# Patient Record
Sex: Female | Born: 1990 | Race: Black or African American | Hispanic: No | Marital: Single
Health system: Midwestern US, Community
[De-identification: ages and names within clinical notes are randomized; demographics above are authoritative.]

## PROBLEM LIST (undated history)

## (undated) DIAGNOSIS — K859 Acute pancreatitis without necrosis or infection, unspecified: Secondary | ICD-10-CM

## (undated) DIAGNOSIS — J45909 Unspecified asthma, uncomplicated: Secondary | ICD-10-CM

---
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
## 2009-06-15 LAB — CBC WITH AUTOMATED DIFF
ABS. BASOPHILS: 0 10*3/uL (ref 0.0–0.06)
ABS. EOSINOPHILS: 0.1 10*3/uL (ref 0.0–0.4)
ABS. LYMPHOCYTES: 0.9 10*3/uL (ref 0.9–3.6)
ABS. MONOCYTES: 0.5 10*3/uL (ref 0.05–1.2)
ABS. NEUTROPHILS: 11.9 10*3/uL — ABNORMAL HIGH (ref 1.8–8.0)
BASOPHILS: 0 % (ref 0–2)
EOSINOPHILS: 1 % (ref 0–5)
HCT: 34 % — ABNORMAL LOW (ref 35.0–45.0)
HGB: 11.5 g/dL — ABNORMAL LOW (ref 12.0–16.0)
LYMPHOCYTES: 7 % — ABNORMAL LOW (ref 21–52)
MCH: 24 PG (ref 24.0–34.0)
MCHC: 33.8 g/dL (ref 31.0–37.0)
MCV: 70.8 FL — ABNORMAL LOW (ref 74.0–97.0)
MONOCYTES: 4 % (ref 3–10)
MPV: 11.1 FL (ref 9.2–11.8)
NEUTROPHILS: 88 % — ABNORMAL HIGH (ref 40–73)
PLATELET: 190 10*3/uL (ref 135–420)
RBC: 4.8 M/uL (ref 4.20–5.30)
RDW: 14.1 % (ref 11.6–14.5)
WBC: 13.4 10*3/uL — ABNORMAL HIGH (ref 4.6–13.2)

## 2009-06-15 LAB — URINALYSIS W/ RFLX MICROSCOPIC
Bilirubin: NEGATIVE
Glucose: NEGATIVE MG/DL
Leukocyte Esterase: NEGATIVE
Nitrites: NEGATIVE
Protein: NEGATIVE MG/DL
Specific gravity: 1.01 (ref 1.003–1.030)
Urobilinogen: 0.2 EU/DL (ref 0.2–1.0)
pH (UA): 6 (ref 5.0–8.0)

## 2009-06-15 LAB — METABOLIC PANEL, BASIC
Anion gap: 15 mmol/L (ref 5–15)
BUN/Creatinine ratio: 8 — ABNORMAL LOW (ref 12–20)
BUN: 6 MG/DL — ABNORMAL LOW (ref 7–18)
CO2: 20 MMOL/L — ABNORMAL LOW (ref 21–32)
Calcium: 8.6 MG/DL (ref 8.4–10.4)
Chloride: 100 MMOL/L (ref 100–108)
Creatinine: 0.8 MG/DL (ref 0.6–1.3)
GFR est AA: >60 mL/min/{1.73_m2} (ref >60)
GFR est non-AA: >60 mL/min/{1.73_m2} (ref >60)
Glucose: 135 MG/DL — ABNORMAL HIGH (ref 74–99)
Potassium: 3.3 MMOL/L — ABNORMAL LOW (ref 3.5–5.5)
Sodium: 135 MMOL/L — ABNORMAL LOW (ref 136–145)

## 2009-06-15 LAB — URINE MICROSCOPIC ONLY
RBC: 0 /HPF (ref 0–5)
WBC: 0 /HPF (ref 0–4)

## 2009-06-15 LAB — HCG URINE, QL: HCG urine, QL: NEGATIVE

## 2009-06-15 NOTE — H&P (Unsigned)
[facility]   [address] [place], [place] [address]     HISTORY AND PHYSICAL REPORT    PATIENT: Rhonda Hammond, Rhonda Hammond  BILLING: [id number] ADMITTED: 06/15/2009  MRN: [id number] LOCATION: [id number]  ATTENDING: Adonis Brook, MD  DICTATING: Adonis Brook, MD      CHIEF COMPLAINT: Shortness of breath.    HISTORY OF PRESENT ILLNESS: This is a pleasant 19 year old female, who has  not seen a physician since she was 30. She claims that she saw Dr. Domenick Bookbinder  in [place] as a pediatrician She has not had any follow up since that  time. She takes, reportedly, some ProAir or albuterol metered-dose inhaler,  but has no other medications. She had increased shortness of breath for the  last few days. Came into the emergency room and was evaluated as having  asthma. O2 saturations have been 91% on room air, hovering in 94 to 95% on  oxygen and after breathing treatments. The patient is still short of breath  and still tachypneic. She has no chest pain. No other issues. She also  claims to have Advair Diskus at home but does not know the dosage.    PAST MEDICAL HISTORY: Asthma, with intubation as a child. Again, has not  seen anyone in terms of medical treatment for at least 3 years.    MEDICATIONS  1. Advair Diskus.  2. Possible albuterol.    ALLERGIES: NO KNOWN DRUG ALLERGIES.    SOCIAL HISTORY: The patient claims to not use tobacco, alcohol, or drugs.  She is independent for all ADLs and IDLs.    REVIEW OF SYSTEMS: Essentially as above.    FAMILY HISTORY: Noncontributory.    PHYSICAL EXAMINATION  VITAL SIGNS: Blood pressure is 139/49, respiratory rate of 24, pulse rate  of 122, temperature is 99.1, O2 saturation is 93% on room air, 94 to 95% on  2 L.  HEENT: Within normal limits.  NECK: No JVD, thyromegaly, bruits or lymphadenopathy.  CHEST: Currently clear to auscultation, though by ER report had some  expiratory wheezing.  CARDIOVASCULAR: Tachycardic. Regular rhythm, however, other than   tachycardia.  ABDOMEN: Soft, nontender, nondistended. Positive bowel sounds.  EXTREMITIES: No clubbing, cyanosis, or edema.  NEUROLOGIC: Nonfocal.    LABORATORY DATA: Laboratory data has not been drawn as of yet. The patient  is just being admitted with basic metabolic panel, CBC, urine pregnancy  test, all drawn. These are pending at the present time.    ASSESSMENT/PLAN  1. Asthma. The patient had has had all of her potential triggers run. There  is no obvious trigger other than possibly pollen from spring/summer season.  She denies having any dust, smoking etc. as a trigger. We will continue  with nebulizer treatments, antibiotics and steroids. As she is still  tachypneic, she does not appear to be tiring out; however, we will monitor  overnight in ICU/PCU status, in case she requires BiPAP etc.  2. The patient is a FULL CODE.  3. Will do Protonix and Lovenox for gastrointestinal/deep venous thrombosis  prophylaxis.  4. Life coach will be contacted for primary care.                         Preliminary   Adonis Brook, MD        RB:wmx  D: 06/15/2009 12:25 A T: 06/15/2009 12:50 A  Job #: [id number] CScriptDoc #: [id number]  cc: Adonis Brook, MD

## 2010-01-26 LAB — POC CHEM8
Anion gap, POC: 17 (ref 10–20)
BUN, POC: 11 MG/DL (ref 7–18)
CO2, POC: 22 MMOL/L (ref 19–24)
Calcium, ionized (POC): 1.14 MMOL/L (ref 1.12–1.32)
Chloride, POC: 104 MMOL/L (ref 100–108)
Creatinine, POC: 0.6 MG/DL (ref 0.6–1.3)
GFRAA, POC: >60 mL/min/{1.73_m2} (ref >60)
GFRNA, POC: >60 mL/min/{1.73_m2} (ref >60)
Glucose, POC: 80 MG/DL (ref 74–106)
Hematocrit, POC: 39 % (ref 36.0–46.0)
Hemoglobin, POC: 13.3 G/DL (ref 12.0–16.0)
Potassium, POC: 4.1 MMOL/L (ref 3.5–5.5)
Sodium, POC: 138 MMOL/L (ref 136–145)

## 2010-01-26 LAB — URINE MICROSCOPIC ONLY
RBC: 0 /HPF (ref 0–5)
WBC: 11 /HPF (ref 0–4)

## 2010-01-26 LAB — URINALYSIS W/ RFLX MICROSCOPIC
Bilirubin: NEGATIVE
Blood: NEGATIVE
Glucose: NEGATIVE MG/DL
Ketone: 40 MG/DL — AB
Nitrites: NEGATIVE
Specific gravity: 1.025 (ref 1.003–1.030)
Urobilinogen: 0.2 EU/DL (ref 0.2–1.0)
pH (UA): 7 (ref 5.0–8.0)

## 2010-01-26 LAB — HCG URINE, QL: HCG urine, QL: NEGATIVE

## 2011-03-29 ENCOUNTER — Inpatient Hospital Stay: Admit: 2011-03-29 | Discharge: 2011-03-29 | Attending: Emergency Medicine

## 2011-03-29 DIAGNOSIS — Z5321 Procedure and treatment not carried out due to patient leaving prior to being seen by health care provider: Secondary | ICD-10-CM

## 2011-03-29 NOTE — ED Notes (Signed)
No answer when called to triage.

## 2011-03-29 NOTE — ED Notes (Signed)
No answer to triage when called 2nd time.

## 2011-03-29 NOTE — ED Notes (Signed)
Called patient third time to triage , no answer. Patient not in wait room area, not outside ER, not in restroom.

## 2011-03-31 NOTE — ED Provider Notes (Signed)
HPI     No past medical history on file.     No past surgical history on file.      No family history on file.     History     Social History   ??? Marital Status: SINGLE     Spouse Name: N/A     Number of Children: N/A   ??? Years of Education: N/A     Occupational History   ??? Not on file.     Social History Main Topics   ??? Smoking status: Not on file   ??? Smokeless tobacco: Not on file   ??? Alcohol Use: Not on file   ??? Drug Use: Not on file   ??? Sexually Active: Not on file     Other Topics Concern   ??? Not on file     Social History Narrative   ??? No narrative on file                  ALLERGIES: Review of patient's allergies indicates not on file.      Review of Systems    There were no vitals filed for this visit.         Physical Exam     MDM    Procedures    The patient LWBS. I was not involved in any aspect of the patient's care. I reviewed the triage sheet and recommend that the patient be called back for further evaluation.

## 2011-07-16 ENCOUNTER — Encounter (HOSPITAL_COMMUNITY): Payer: Self-pay | Admitting: Emergency Medicine

## 2011-07-16 ENCOUNTER — Emergency Department (HOSPITAL_COMMUNITY): Payer: Self-pay

## 2011-07-16 ENCOUNTER — Emergency Department (HOSPITAL_COMMUNITY)
Admission: EM | Admit: 2011-07-16 | Discharge: 2011-07-16 | Disposition: A | Discharge status: Home / Self Care | Payer: Self-pay | Attending: Emergency Medicine | Admitting: Emergency Medicine

## 2011-07-16 DIAGNOSIS — R062 Wheezing: Secondary | ICD-10-CM | POA: Insufficient documentation

## 2011-07-16 DIAGNOSIS — R079 Chest pain, unspecified: Secondary | ICD-10-CM | POA: Insufficient documentation

## 2011-07-16 DIAGNOSIS — R10819 Abdominal tenderness, unspecified site: Secondary | ICD-10-CM | POA: Insufficient documentation

## 2011-07-16 DIAGNOSIS — K8020 Calculus of gallbladder without cholecystitis without obstruction: Secondary | ICD-10-CM | POA: Insufficient documentation

## 2011-07-16 DIAGNOSIS — N200 Calculus of kidney: Secondary | ICD-10-CM | POA: Insufficient documentation

## 2011-07-16 DIAGNOSIS — R109 Unspecified abdominal pain: Secondary | ICD-10-CM | POA: Insufficient documentation

## 2011-07-16 HISTORY — DX: Unspecified asthma, uncomplicated: J45.909

## 2011-07-16 LAB — COMPREHENSIVE METABOLIC PANEL
ALT: 16 U/L (ref 0–35)
AST: 26 U/L (ref 0–37)
Albumin: 4.2 g/dL (ref 3.5–5.2)
Alkaline Phosphatase: 73 U/L (ref 39–117)
BUN: 5 mg/dL — ABNORMAL LOW (ref 6–23)
CO2: 23 mEq/L (ref 19–32)
Calcium: 9.7 mg/dL (ref 8.4–10.5)
Chloride: 101 mEq/L (ref 96–112)
Creatinine, Ser: 0.6 mg/dL (ref 0.50–1.10)
GFR calc Af Amer: >90 mL/min (ref >90)
GFR calc non Af Amer: >90 mL/min (ref >90)
Glucose, Bld: 86 mg/dL (ref 70–99)
Potassium: 3.9 mEq/L (ref 3.5–5.1)
Sodium: 137 mEq/L (ref 135–145)
Total Bilirubin: 0.4 mg/dL (ref 0.3–1.2)
Total Protein: 8.8 g/dL — ABNORMAL HIGH (ref 6.0–8.3)

## 2011-07-16 LAB — CBC
HCT: 35.5 % — ABNORMAL LOW (ref 36.0–46.0)
Hemoglobin: 12.1 g/dL (ref 12.0–15.0)
MCH: 23.1 pg — ABNORMAL LOW (ref 26.0–34.0)
MCHC: 34.1 g/dL (ref 30.0–36.0)
MCV: 67.7 fL — ABNORMAL LOW (ref 78.0–100.0)
Platelets: 204 10*3/uL (ref 150–400)
RBC: 5.24 MIL/uL — ABNORMAL HIGH (ref 3.87–5.11)
RDW: 17 % — ABNORMAL HIGH (ref 11.5–15.5)
WBC: 11.3 10*3/uL — ABNORMAL HIGH (ref 4.0–10.5)

## 2011-07-16 LAB — URINALYSIS, ROUTINE W REFLEX MICROSCOPIC
Bilirubin Urine: NEGATIVE
Glucose, UA: NEGATIVE mg/dL
Hgb urine dipstick: NEGATIVE
Ketones, ur: NEGATIVE mg/dL
Nitrite: NEGATIVE
Protein, ur: 30 mg/dL — AB
Specific Gravity, Urine: 1.017 (ref 1.005–1.030)
Urobilinogen, UA: 0.2 mg/dL (ref 0.0–1.0)
pH: 6.5 (ref 5.0–8.0)

## 2011-07-16 LAB — PREGNANCY, URINE: Preg Test, Ur: NEGATIVE

## 2011-07-16 LAB — LIPASE, BLOOD: Lipase: 15 U/L (ref 11–59)

## 2011-07-16 LAB — URINE MICROSCOPIC-ADD ON

## 2011-07-16 MED ORDER — HYDROMORPHONE HCL PF 1 MG/ML IJ SOLN
1.0000 mg | Freq: Once | INTRAMUSCULAR | Status: AC
  Administered 2011-07-16: 1 mg via INTRAVENOUS
  Filled 2011-07-16: qty 1

## 2011-07-16 MED ORDER — OXYCODONE-ACETAMINOPHEN 5-325 MG PO TABS: 1.0000 | ORAL_TABLET | ORAL | Status: AC | PRN

## 2011-07-16 MED ORDER — ONDANSETRON HCL 4 MG/2ML IJ SOLN
4.0000 mg | Freq: Once | INTRAMUSCULAR | Status: AC
  Administered 2011-07-16: 4 mg via INTRAVENOUS
  Filled 2011-07-16: qty 2

## 2011-07-16 MED ORDER — ALBUTEROL SULFATE (5 MG/ML) 0.5% IN NEBU
INHALATION_SOLUTION | RESPIRATORY_TRACT | Status: AC
  Administered 2011-07-16: 15:00:00
  Filled 2011-07-16: qty 1

## 2011-07-16 MED ORDER — SODIUM CHLORIDE 0.9 % IV BOLUS (SEPSIS)
1000.0000 mL | Freq: Once | INTRAVENOUS | Status: AC
  Administered 2011-07-16: 1000 mL via INTRAVENOUS

## 2011-07-16 MED ORDER — IOHEXOL 300 MG/ML  SOLN
100.0000 mL | Freq: Once | INTRAMUSCULAR | Status: AC | PRN
  Administered 2011-07-16: 100 mL via INTRAVENOUS

## 2011-07-16 NOTE — ED Notes (Signed)
Pt states that she has been having diffuse upper abdominal pain x 3 days.  Pain got worse today.  While EMS was assessing her she felt like she was beginning to have an asthma attack.  Pt was put on a 5 mg albuterol tx.  Wheezing has since subsided.

## 2011-07-16 NOTE — ED Provider Notes (Signed)
History    21yF with abdominal pain. Gradual onset about 3d ago. Diffuse on R side, worst in RUQ. Constant. No radiation. Worse with movement. No n/v/d. No fever or chills. No urinary complaints. No vaginal bleeding or discharge. Doesn't think pregnant. Surgical hx significant c-section about 3y ago. Denies recent procedures. Denies CP as mentioned in triage note. No SOB.  CSN: [id number]  Arrival date & time 07/16/11  1452   First MD Initiated Contact with Patient 07/16/11 1539      Chief Complaint  Patient presents with  . Abdominal Pain  . Chest Pain    tightness w/ wheezing    (Consider location/radiation/quality/duration/timing/severity/associated sxs/prior treatment) HPI  Past Medical History  Diagnosis Date  . Asthma     History reviewed. No pertinent past surgical history.  History reviewed. No pertinent family history.  History  Substance Use Topics  . Smoking status: Current Everyday Smoker -- 1.0 packs/day    Types: Cigarettes  . Smokeless tobacco: Not on file  . Alcohol Use: Yes    OB History    Grav Para Term Preterm Abortions TAB SAB Ect Mult Living                  Review of Systems   Review of symptoms negative unless otherwise noted in HPI.   Allergies  Review of patient's allergies indicates no known allergies.  Home Medications   Current Outpatient Rx  Name Route Sig Dispense Refill  . ASPIRIN-SALICYLAMIDE-CAFFEINE 650-195-33.3 MG PO PACK Oral Take 2 Packages by mouth once.      BP 112/67  Pulse 91  Resp 22  SpO2 100%  LMP 07/01/2011  Physical Exam  Nursing note and vitals reviewed. Constitutional: She appears well-developed and well-nourished. No distress.       Laying in bed. Nad. Obese.  HENT:  Head: Normocephalic and atraumatic.  Eyes: Conjunctivae are normal. Right eye exhibits no discharge. Left eye exhibits no discharge.  Neck: Neck supple.  Cardiovascular: Normal rate, regular rhythm and normal heart sounds.  Exam  reveals no gallop and no friction rub.   No murmur heard. Pulmonary/Chest: Effort normal and breath sounds normal. No respiratory distress.  Abdominal: Soft. She exhibits no distension and no mass. There is tenderness. There is guarding. There is no rebound.       R sided abdominal tenderness with voluntary guarding. No rebound. No distension.  Genitourinary:       No cva tenderness  Musculoskeletal: She exhibits no edema and no tenderness.  Neurological: She is alert.  Skin: Skin is warm and dry. She is not diaphoretic.  Psychiatric: She has a normal mood and affect. Her behavior is normal. Thought content normal.    ED Course  Procedures (including critical care time)  Labs Reviewed  URINALYSIS, ROUTINE W REFLEX MICROSCOPIC - Abnormal; Notable for the following:    APPearance CLOUDY (*)     Protein, ur 30 (*)     Leukocytes, UA SMALL (*)     All other components within normal limits  CBC - Abnormal; Notable for the following:    WBC 11.3 (*)     RBC 5.24 (*)     HCT 35.5 (*)     MCV 67.7 (*)     MCH 23.1 (*)     RDW 17.0 (*)     All other components within normal limits  COMPREHENSIVE METABOLIC PANEL - Abnormal; Notable for the following:    BUN 5 (*)  Total Protein 8.8 (*)     All other components within normal limits  LIPASE, BLOOD  PREGNANCY, URINE  URINE MICROSCOPIC-ADD ON   US Abdomen Complete  07/16/2011  *RADIOLOGY REPORT*  Clinical Data:  Right upper quadrant pain.  COMPLETE ABDOMINAL ULTRASOUND  Comparison:  CT earlier today.  Findings:  Gallbladder:  Multiple small gallstones within the gallbladder.  No wall thickening.  Negative sonographic Murphy's.  Common bile duct:   Normal caliber, 4 mm.  Liver:  No focal lesion identified.  Within normal limits in parenchymal echogenicity.  IVC:  Appears normal.  Pancreas:  No focal abnormality seen.  Spleen:  Within normal limits in size and echotexture.  Right Kidney:   Normal in size and parenchymal echogenicity.  No  evidence of mass or hydronephrosis.  Left Kidney:  Normal in size and parenchymal echogenicity.  No evidence of mass or hydronephrosis.  Abdominal aorta:  No aneurysm identified.  IMPRESSION: Cholelithiasis.  No sonographic evidence of acute cholecystitis.  Original Report Authenticated By: Cyndie Chime, M.D.   Ct Abdomen Pelvis W Contrast  07/16/2011  *RADIOLOGY REPORT*  Clinical Data: Right-sided abdominal pain  CT ABDOMEN AND PELVIS WITH CONTRAST  Technique:  Multidetector CT imaging of the abdomen and pelvis was performed following the standard protocol during bolus administration of intravenous contrast.  Contrast: OMNIPAQUE IOHEXOL 300 MG/ML  SOLN  Comparison: None.  Findings: Dependent type changes noted within the lung bases medially.  No pericardial or pleural effusion.  There is no focal liver abnormality noted.  The gallbladder appears normal.  There is no biliary dilatation.  The pancreas is unremarkable.  The spleen is negative.  Both adrenal glands are normal.  Multiple small renal calculi are identified.  No obstructive uropathy identified.  No hydronephrosis or hydroureter identified.  No ureterolithiasis or bladder calculi identified.  The uterus and adnexal structures have a normal physiologic appearance  The stomach and the small bowel loops are within normal limits. The common the appendix is identified and appears normal.  The colon is normal.  There is no free fluid or fluid collections within the abdomen or pelvis.  No enlarged upper abdominal or pelvic adenopathy.  No inguinal adenopathy.  Review of the visualized bony structures is unremarkable.  IMPRESSION:  1. Bilateral nonobstructing renal calculi.  Original Report Authenticated By: Rosealee Albee, M.D.     1. Abdominal pain   2. Cholelithiasis       MDM  21 year old female with right-sided abdominal pain. Workup significant for cholelithiasis. Suspect the patient's symptoms are secondary to this. There is no  evidence of cholecystitis. Plan symptomatic treatment and surgical referral.        Raeford Razor, MD 07/17/11 [phone]

## 2011-07-16 NOTE — ED Notes (Signed)
ZOX:WR60<AV> Expected date:07/16/11<BR> Expected time: 2:50 PM<BR> Means of arrival:Ambulance<BR> Comments:<BR> Diarrhea, Shob, neb tx in progress

## 2011-07-16 NOTE — ED Notes (Signed)
Per EMS: Pt coming from home c/o rt and lt upper quadrant abdominal pain w/ chest tightness.  Wheezing (inspiratory and expiratory).  Hx of asthma.  No home meds for it.  Pt given 5 albuterol en route.  20 g in lt AC.

## 2011-07-16 NOTE — Discharge Instructions (Signed)
RESOURCE GUIDE  Chronic Pain Problems: Contact [facility]  [phone] Patients need to be referred by their primary care doctor.  Insufficient Money for Medicine: Contact United Way:  call "211" or [facility] [phone].  No Primary Care Doctor: - Call [facility] Connect  [phone] - can help you locate a primary care doctor that  accepts your insurance, provides certain services, etc. - Physician Referral Service- 1-800-533-3463  Agencies that provide inexpensive medical care: - [facility]  [phone] - [facility]  [phone] - [facility]  [phone] - [facility]'s Clinic  [phone] - Planned Parenthood  [phone] - [facility]  [phone]  Medicaid-accepting [place] Providers: - [facility]- [address]  [phone], Mon-Fri 9am-7pm, Sat 9am-1pm - [facility]- [address], [address]  [phone] - [facility]- [address], [address]  [phone] - [facility]- [address] [place] [address]  [phone] - Ve[facility]- [address], [address], [phone]  Only accepts [place] Access Medicaid patients after they have their name  applied to their card  Self Pay (no insurance) in [place]: - Sickle Cell Patients: Dr Eric Dean, [facility]  [address], [phone] - [facility]- [address]  [phone]       -     [facility]- [address]       -     [facility]- see information above (Speak to Pam H if you do not have insurance)       -  [facility]- [address], [phone]       -  [facility] [place]- [address],  [phone]       -  [facility]- [address] [place] [address], [phone]       -  Dr Osei-Bonsu-  [address], [place], [phone]       -  [facility]- [address], [phone]       -  [facility]- [address] [place] [address], [phone], also [address], [phone]       -    [facility]- [address], [phone], 1st & 3rd Saturday   every month, 10am-1pm  1) Find a Doctor and Pay Out of Pocket Although you won't have to find out who is covered by your insurance plan, it is a good idea to ask around and get recommendations. You will then need to call the office and see if the doctor you have chosen will accept you as a new patient and what types of options they offer for patients who are self-pay. Some doctors offer discounts or will set up payment plans for their patients who do not have insurance, but you will need to ask so you aren't surprised when you get to your appointment.  2) Contact Your Local Health Department Not all health departments have doctors that can see patients for sick visits, but many do, so it is worth a call to see if yours does. If you don't know where your local health department is, you can check in your phone book. The CDC also has a tool to help you locate your state's health department, and many state websites also have   listings of all of their local health departments.  3) Find a Walk-in Clinic If your illness is not likely to be very severe or complicated, you may want to try a walk in clinic. These are popping up all over the country in pharmacies, drugstores, and shopping centers. They're usually staffed by nurse practitioners or physician assistants that have been trained to treat common illnesses and complaints. They're usually fairly quick and inexpensive. However, if you have serious medical issues or chronic medical problems, these are probably not your best option  STD Testing - [place] [facility] [place], [facility], [address], [place], phone [phone] or 1-877-539-9860.  Monday - Friday, call for an appointment. - [place]  [facility] [place], [facility], [address], [place], phone [phone] or 1-877-539-9860.  Monday - Friday, call for an appointment.  Abuse/Neglect: - [place] Child Abuse Hotline (336) 641-3795 - [place] Child Abuse Hotline 800-378-5315 (After Hours)  Emergency Shelter:  [facility] (336) 271-5985  Maternity Homes: - [facility] (336) 275-9566 - [facility] (704) 372-4663  MRSA Hotline #:   832-7006  [place] Resources  [facility] [place]  [employer] [place] [facility] [address]                 [address]         [address]  [place]                                               [place]                              [place] Phone:  [phone]                                  Phone:  [phone]                   Phone:  [phone]  [place] [facility], [phone] - [place] Services - [facility]- 1-888-581-9988       -     [facility] in [place], [address],                                  336-349-4454, Insurance  [place] Child Abuse Hotline (336) 342-1394 or (336) 342-3537 (After Hours)   Behavioral Health Services  Substance Abuse Resources: - [facility]  336-882-2125 - [facility] 336-784-9470 - [facility] [place] [facility] 336-285-9073 - [facility] 336-845-3988 - Residential & Outpatient Substance Abuse Program  800-659-3381  Psychological Services: - [facility]  [phone] - [facility]  [phone] - [place] [facility], [address], [place], ACCESS LINE: 1-800-853-5163 or 336-641-4981, Http://www.[facility].com/services/adult.htm  Dental Assistance  If unable to pay or uninsured, contact:  [facility] or [place] [facility] to become qualified for the adult dental  clinic.  Patients with Medicaid:  [facility] [address], [phone], [phone]  If unable   to pay, or uninsured, contact [facility] ([phone]) or [place] [facility] ([phone] in [place], [phone] in [place]) to become qualified for the adult dental clinic  Other Low-Cost Community Dental Services: - [facility]- [address], [place], [place], [address], [phone], Ext. [address], 2nd and 4th Thursday of the month at 6:30am.  10 clients each day by appointment, can sometimes see walk-in patients if someone does not show for an appointment. - [facility]- [address], [place], [place], [address], [phone] - [facility]- [address], [place], [place], [address], [phone] - [place] [facility]- [phone] - [facility]- [phone] - [facility]- [phone]    

## 2011-10-14 ENCOUNTER — Emergency Department (HOSPITAL_COMMUNITY)
Admission: EM | Admit: 2011-10-14 | Discharge: 2011-10-14 | Disposition: A | Discharge status: Home / Self Care | Payer: Medicaid - Out of State | Attending: Emergency Medicine | Admitting: Emergency Medicine

## 2011-10-14 ENCOUNTER — Encounter (HOSPITAL_COMMUNITY): Payer: Self-pay | Admitting: Emergency Medicine

## 2011-10-14 DIAGNOSIS — J069 Acute upper respiratory infection, unspecified: Secondary | ICD-10-CM

## 2011-10-14 DIAGNOSIS — F17200 Nicotine dependence, unspecified, uncomplicated: Secondary | ICD-10-CM | POA: Insufficient documentation

## 2011-10-14 DIAGNOSIS — J45909 Unspecified asthma, uncomplicated: Secondary | ICD-10-CM | POA: Insufficient documentation

## 2011-10-14 DIAGNOSIS — J029 Acute pharyngitis, unspecified: Secondary | ICD-10-CM

## 2011-10-14 LAB — RAPID STREP SCREEN (MED CTR MEBANE ONLY): Streptococcus, Group A Screen (Direct): NEGATIVE

## 2011-10-14 MED ORDER — PSEUDOEPHEDRINE HCL ER 120 MG PO TB12: 120.0000 mg | ORAL_TABLET | Freq: Two times a day (BID) | ORAL | Status: DC

## 2011-10-14 MED ORDER — IBUPROFEN 600 MG PO TABS: 600.0000 mg | ORAL_TABLET | Freq: Four times a day (QID) | ORAL | Status: DC | PRN

## 2011-10-14 MED ORDER — HYDROCODONE-ACETAMINOPHEN 7.5-500 MG/15ML PO SOLN
10.0000 mL | Freq: Once | ORAL | Status: AC
  Administered 2011-10-14: 10 mL via ORAL
  Filled 2011-10-14: qty 30

## 2011-10-14 NOTE — ED Notes (Signed)
Pt c/o sore throat and left ear pain x 3 days. 

## 2011-10-14 NOTE — ED Provider Notes (Signed)
Medical screening examination/treatment/procedure(s) were performed by non-physician practitioner and as supervising physician I was immediately available for consultation/collaboration.  Keyra Virella B. Davaughn Hillyard, MD 10/14/11 2256 

## 2011-10-14 NOTE — ED Provider Notes (Signed)
History     CSN: [id number]  Arrival date & time 10/14/11  1614   First MD Initiated Contact with Patient 10/14/11 1952      Chief Complaint  Patient presents with  . Sore Throat  . Otalgia    (Consider location/radiation/quality/duration/timing/severity/associated sxs/prior treatment) HPI Comments: 3 days of URI with sore throat, ear pain and congestion has been taking OTC medication without much relief Denied fever, N/V mylagia  Patient is a 21 y.o. female presenting with pharyngitis and ear pain. The history is provided by the patient.  Sore Throat This is a new problem. The current episode started in the past 7 days. The problem occurs constantly. The problem has been unchanged. Associated symptoms include congestion and a sore throat. Pertinent negatives include no chills, coughing, fever, headaches, nausea, rash or weakness.  Otalgia Associated symptoms include sore throat. Pertinent negatives include no headaches, no rhinorrhea, no cough and no rash.    Past Medical History  Diagnosis Date  . Asthma     History reviewed. No pertinent past surgical history.  No family history on file.  History  Substance Use Topics  . Smoking status: Current Every Day Smoker -- 1.0 packs/day    Types: Cigarettes  . Smokeless tobacco: Not on file  . Alcohol Use: Yes    OB History    Grav Para Term Preterm Abortions TAB SAB Ect Mult Living                  Review of Systems  Constitutional: Negative for fever and chills.  HENT: Positive for ear pain, congestion, sore throat and trouble swallowing. Negative for rhinorrhea and voice change.   Respiratory: Negative for cough.   Gastrointestinal: Negative for nausea.  Skin: Negative for rash.  Neurological: Negative for dizziness, weakness and headaches.    Allergies  Review of patient's allergies indicates no known allergies.  Home Medications   Current Outpatient Rx  Name Route Sig Dispense Refill  . ALBUTEROL SULFATE  HFA 108 (90 BASE) MCG/ACT IN AERS Inhalation Inhale 2 puffs into the lungs every 6 (six) hours as needed. For shortness of breath    . IBUPROFEN 600 MG PO TABS Oral Take 1 tablet (600 mg total) by mouth every 6 (six) hours as needed for pain. 30 tablet 0  . PSEUDOEPHEDRINE HCL ER 120 MG PO TB12 Oral Take 1 tablet (120 mg total) by mouth every 12 (twelve) hours. 20 tablet 0    BP 135/74  Pulse 76  Temp 98.7 F (37.1 C) (Oral)  Resp 18  Ht 5\' 7"  (1.702 m)  Wt 197 lb (89.359 kg)  BMI 30.85 kg/m2  SpO2 95%  LMP 10/14/2011  Physical Exam  Constitutional: She is oriented to person, place, and time. She appears well-developed and well-nourished.  HENT:  Head: Normocephalic. No trismus in the jaw.  Mouth/Throat: Uvula is midline. No uvula swelling. Posterior oropharyngeal erythema present. No oropharyngeal exudate, posterior oropharyngeal edema or tonsillar abscesses.  Neck: Normal range of motion.  Cardiovascular: Normal rate.   Musculoskeletal: Normal range of motion.  Lymphadenopathy:    She has cervical adenopathy.       Right cervical: Superficial cervical adenopathy present.       Left cervical: Superficial cervical adenopathy present.       Minimal swelling of lymph nodes   Neurological: She is alert and oriented to person, place, and time.  Skin: Skin is warm.    ED Course  Procedures (including critical care time)  Labs Reviewed  RAPID STREP SCREEN   No results found.   1. URI (upper respiratory infection)   2. Pharyngitis       MDM  URI symptoms with negative strep screen will treat with decongestant and 1 dose of Lortab in the ED        Arman Filter, NP 10/14/11 2005

## 2012-12-30 ENCOUNTER — Emergency Department (HOSPITAL_COMMUNITY)
Admission: EM | Admit: 2012-12-30 | Discharge: 2012-12-30 | Discharge status: Against Medical Advice | Payer: Self-pay | Attending: Emergency Medicine | Admitting: Emergency Medicine

## 2012-12-30 ENCOUNTER — Emergency Department (HOSPITAL_COMMUNITY): Payer: Self-pay

## 2012-12-30 ENCOUNTER — Encounter (HOSPITAL_COMMUNITY): Payer: Self-pay | Admitting: Emergency Medicine

## 2012-12-30 DIAGNOSIS — J45909 Unspecified asthma, uncomplicated: Secondary | ICD-10-CM | POA: Insufficient documentation

## 2012-12-30 DIAGNOSIS — W010XXA Fall on same level from slipping, tripping and stumbling without subsequent striking against object, initial encounter: Secondary | ICD-10-CM | POA: Insufficient documentation

## 2012-12-30 DIAGNOSIS — S4980XA Other specified injuries of shoulder and upper arm, unspecified arm, initial encounter: Secondary | ICD-10-CM | POA: Insufficient documentation

## 2012-12-30 DIAGNOSIS — S46909A Unspecified injury of unspecified muscle, fascia and tendon at shoulder and upper arm level, unspecified arm, initial encounter: Secondary | ICD-10-CM | POA: Insufficient documentation

## 2012-12-30 DIAGNOSIS — F17200 Nicotine dependence, unspecified, uncomplicated: Secondary | ICD-10-CM | POA: Insufficient documentation

## 2012-12-30 DIAGNOSIS — Y92009 Unspecified place in unspecified non-institutional (private) residence as the place of occurrence of the external cause: Secondary | ICD-10-CM | POA: Insufficient documentation

## 2012-12-30 DIAGNOSIS — Y9339 Activity, other involving climbing, rappelling and jumping off: Secondary | ICD-10-CM | POA: Insufficient documentation

## 2012-12-30 NOTE — ED Notes (Signed)
Patient transported to X-ray 

## 2012-12-30 NOTE — ED Notes (Signed)
In to see pt- pt not in room- other RN reports pt walked to lobby- AMA pt.
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
## 2015-09-09 ENCOUNTER — Encounter (HOSPITAL_COMMUNITY): Payer: Self-pay

## 2015-09-09 ENCOUNTER — Emergency Department (HOSPITAL_COMMUNITY): Payer: Medicaid Other

## 2015-09-09 ENCOUNTER — Emergency Department (HOSPITAL_COMMUNITY)
Admission: EM | Admit: 2015-09-09 | Discharge: 2015-09-09 | Disposition: A | Discharge status: Home / Self Care | Payer: Medicaid Other | Attending: Emergency Medicine | Admitting: Emergency Medicine

## 2015-09-09 DIAGNOSIS — F17210 Nicotine dependence, cigarettes, uncomplicated: Secondary | ICD-10-CM | POA: Insufficient documentation

## 2015-09-09 DIAGNOSIS — Z791 Long term (current) use of non-steroidal anti-inflammatories (NSAID): Secondary | ICD-10-CM | POA: Insufficient documentation

## 2015-09-09 DIAGNOSIS — J45901 Unspecified asthma with (acute) exacerbation: Secondary | ICD-10-CM

## 2015-09-09 DIAGNOSIS — R062 Wheezing: Secondary | ICD-10-CM | POA: Diagnosis present

## 2015-09-09 DIAGNOSIS — Z7982 Long term (current) use of aspirin: Secondary | ICD-10-CM | POA: Insufficient documentation

## 2015-09-09 MED ORDER — PREDNISONE 50 MG PO TABS: ORAL_TABLET | ORAL | 0 refills | Status: DC

## 2015-09-09 MED ORDER — ALBUTEROL SULFATE (2.5 MG/3ML) 0.083% IN NEBU
5.0000 mg | INHALATION_SOLUTION | Freq: Once | RESPIRATORY_TRACT | Status: AC
  Administered 2015-09-09: 5 mg via RESPIRATORY_TRACT
  Filled 2015-09-09: qty 6

## 2015-09-09 MED ORDER — ALBUTEROL SULFATE HFA 108 (90 BASE) MCG/ACT IN AERS
1.0000 | INHALATION_SPRAY | Freq: Once | RESPIRATORY_TRACT | Status: AC
  Administered 2015-09-09: 1 via RESPIRATORY_TRACT
  Filled 2015-09-09: qty 6.7

## 2015-09-09 NOTE — ED Notes (Signed)
Patient ambulated with pulse ox, oxygen stayed 99%,pulse stayed between 127-140.

## 2015-09-09 NOTE — ED Triage Notes (Signed)
Patient arrives by EMS with complaints of asthma exacerbation.  Patient called EMS at 2130 last night and given an albuterol neb-felt better and declined transport. This am/wheezing-given albuterol 10 mg and atrovent 0.5 mg-#20 left hand-solumedrol 125 mg IV per EMS

## 2015-09-09 NOTE — Discharge Instructions (Signed)
Use albuterol inhaler as needed for wheezing. Avoid tobacco use. Take prednisone as prescribed. Follow up with your primary care provider or the [facility] for re-evaluation if your symptoms do not improve. Return to the ED if you experience severe worsening of your symptoms, fevers, chest pain, difficulty breathing.

## 2015-09-09 NOTE — ED Provider Notes (Signed)
[facility]-EMERGENCY DEPT Provider Note   CSN: [id number] Arrival date & time: 09/09/15  [id number]     History   Chief Complaint Chief Complaint  Patient presents with  . Wheezing  . Asthma    HPI Hannah Hardy is a 25 y.o. female with a pmhx of asthma who presents to the ED today c/o shortness of breath. Pt states that 3 days ago she was walking home and felt gradual onset wheezing, chest tightness and SOB. Pt then developed a productive cough with clear sputum and reports fever up to 102 at home. Pt does not have inhaler at home and has not taken anything for her symptoms. Last night pt began wheezing and called EMS. They came out to her house and gave her a breathing treatment. Pt improved and declined to go to the ED at that point. However this morning pt began wheezing again and was having difficulty breathing so she called EMS and presented to the ED. Pt was given 125 solumedrol, albuterol 10 mg and atrovent 0.5 by EMS with improvement. She denies any CP, dizziness, LOC.   HPI  Past Medical History:  Diagnosis Date  . Asthma     There are no active problems to display for this patient.   History reviewed. No pertinent surgical history.  OB History    No data available       Home Medications    Prior to Admission medications   Not on File    Family History History reviewed. No pertinent family history.  Social History Social History  Substance Use Topics  . Smoking status: Current Every Day Smoker    Packs/day: 1.00    Types: Cigarettes  . Smokeless tobacco: Never Used  . Alcohol use Yes     Comment: occ     Allergies   Review of patient's allergies indicates no known allergies.   Review of Systems Review of Systems  All other systems reviewed and are negative.    Physical Exam Updated Vital Signs BP 113/72 (BP Location: Left Arm)   Pulse 90   Temp 97.6 F (36.4 C) (Oral)   Resp 22   Ht 5\' 7"  (1.702 m)   Wt 88.9 kg   LMP 09/05/2015  (Approximate)   SpO2 100%   BMI 30.70 kg/m   Physical Exam  Constitutional: She is oriented to person, place, and time. She appears well-developed and well-nourished. No distress.  HENT:  Head: Normocephalic and atraumatic.  Mouth/Throat: No oropharyngeal exudate.  Eyes: Conjunctivae and EOM are normal. Pupils are equal, round, and reactive to light. Right eye exhibits no discharge. Left eye exhibits no discharge. No scleral icterus.  Cardiovascular: Normal rate, regular rhythm, normal heart sounds and intact distal pulses.  Exam reveals no gallop and no friction rub.   No murmur heard. Pulmonary/Chest: Effort normal. No respiratory distress. She has wheezes ( expiratory wheezes in all lung fields ). She has no rales. She exhibits no tenderness.  Diminished breath sounds  Abdominal: Soft. She exhibits no distension. There is no tenderness. There is no guarding.  Musculoskeletal: Normal range of motion. She exhibits no edema.  Neurological: She is alert and oriented to person, place, and time.  Skin: Skin is warm and dry. No rash noted. She is not diaphoretic. No erythema. No pallor.  Psychiatric: She has a normal mood and affect. Her behavior is normal.  Nursing note and vitals reviewed.    ED Treatments / Results  Labs (all labs ordered are listed, but  only abnormal results are displayed) Labs Reviewed - No data to display  EKG  EKG Interpretation None       Radiology Dg Chest 2 View  Result Date: 09/09/2015 CLINICAL DATA:  Shortness of breath with cough and congestion EXAM: CHEST  2 VIEW COMPARISON:  None. FINDINGS: Lungs are clear. Heart size and pulmonary vascularity are normal. No adenopathy. No pneumothorax. No bone lesions. IMPRESSION: No edema or consolidation. Electronically Signed   By: Bretta BangWilliam  Woodruff III M.D.   On: 09/09/2015 07:25    Procedures Procedures (including critical care time)  Medications Ordered in ED Medications  albuterol (PROVENTIL) (2.5  MG/3ML) 0.083% nebulizer solution 5 mg (not administered)     Initial Impression / Assessment and Plan / ED Course  I have reviewed the triage vital signs and the nursing notes.  Pertinent labs & imaging results that were available during my care of the patient were reviewed by me and considered in my medical decision making (see chart for details).  Clinical Course    25 y.o F presents to the Ed with likely asthma exacerbation. Pt has been experiencing wheezing and shortness of breath for the last 3 days. She has been out of her home inhaler and has not taken any medications. Pt was given 125 mg solumedrol by EMS and albuterol in haler. In ED, pt has wheezing in lung fields. Pt is afebrile, no sign of respiratory distress. CXR unremarkable. Upone re-eval after atrovent, lungs now CTAB. Pt reports significant symptomatic improvement. Pt ambulated and maintained O2 saturations above 95%.   Pt now sleeping in exam room, resting comfortably. Will d/c home with albuterol inhaler and prednisone burst. Recommend follow up with PCP.   Final Clinical Impressions(s) / ED Diagnoses   Final diagnoses:  Asthma exacerbation    New Prescriptions New Prescriptions   No medications on file     Dub MikesSamantha Tripp Zora Glendenning, PA-C 09/09/15 1541    Shon Batonourtney F Horton, MD 09/13/15 0400

## 2015-09-09 NOTE — ED Notes (Signed)
Bed: WA14 Expected date:  Expected time:  Means of arrival:  Comments: EMS-asthma 

## 2015-11-25 ENCOUNTER — Emergency Department (HOSPITAL_COMMUNITY): Payer: Self-pay

## 2015-11-25 ENCOUNTER — Inpatient Hospital Stay (HOSPITAL_COMMUNITY)
Admission: EM | Admit: 2015-11-25 | Discharge: 2015-11-29 | DRG: 440 | Disposition: A | Discharge status: Home / Self Care | Payer: Self-pay | Attending: Internal Medicine | Admitting: Internal Medicine

## 2015-11-25 ENCOUNTER — Encounter (HOSPITAL_COMMUNITY): Payer: Self-pay | Admitting: Emergency Medicine

## 2015-11-25 DIAGNOSIS — R7302 Impaired glucose tolerance (oral): Secondary | ICD-10-CM | POA: Diagnosis present

## 2015-11-25 DIAGNOSIS — K8020 Calculus of gallbladder without cholecystitis without obstruction: Secondary | ICD-10-CM | POA: Diagnosis present

## 2015-11-25 DIAGNOSIS — F17210 Nicotine dependence, cigarettes, uncomplicated: Secondary | ICD-10-CM | POA: Diagnosis present

## 2015-11-25 DIAGNOSIS — F1010 Alcohol abuse, uncomplicated: Secondary | ICD-10-CM | POA: Diagnosis present

## 2015-11-25 DIAGNOSIS — D509 Iron deficiency anemia, unspecified: Secondary | ICD-10-CM | POA: Diagnosis present

## 2015-11-25 DIAGNOSIS — E669 Obesity, unspecified: Secondary | ICD-10-CM | POA: Diagnosis present

## 2015-11-25 DIAGNOSIS — J45909 Unspecified asthma, uncomplicated: Secondary | ICD-10-CM | POA: Diagnosis present

## 2015-11-25 DIAGNOSIS — Z6829 Body mass index (BMI) 29.0-29.9, adult: Secondary | ICD-10-CM

## 2015-11-25 DIAGNOSIS — K859 Acute pancreatitis without necrosis or infection, unspecified: Principal | ICD-10-CM

## 2015-11-25 LAB — CBC WITH DIFFERENTIAL/PLATELET
BASOS PCT: 0 %
Basophils Absolute: 0 10*3/uL (ref 0.0–0.1)
EOS PCT: 0 %
Eosinophils Absolute: 0 10*3/uL (ref 0.0–0.7)
HEMATOCRIT: 34.5 % — AB (ref 36.0–46.0)
Hemoglobin: 11.9 g/dL — ABNORMAL LOW (ref 12.0–15.0)
Lymphocytes Relative: 9 %
Lymphs Abs: 0.8 10*3/uL (ref 0.7–4.0)
MCH: 26.6 pg (ref 26.0–34.0)
MCHC: 34.5 g/dL (ref 30.0–36.0)
MCV: 77 fL — AB (ref 78.0–100.0)
MONO ABS: 0.8 10*3/uL (ref 0.1–1.0)
MONOS PCT: 8 %
NEUTROS ABS: 8.1 10*3/uL — AB (ref 1.7–7.7)
Neutrophils Relative %: 83 %
Platelets: 225 10*3/uL (ref 150–400)
RBC: 4.48 MIL/uL (ref 3.87–5.11)
RDW: 17.9 % — AB (ref 11.5–15.5)
WBC: 9.7 10*3/uL (ref 4.0–10.5)

## 2015-11-25 LAB — COMPREHENSIVE METABOLIC PANEL
ALBUMIN: 4.7 g/dL (ref 3.5–5.0)
ALK PHOS: 58 U/L (ref 38–126)
ALT: 17 U/L (ref 14–54)
ANION GAP: 12 (ref 5–15)
AST: 29 U/L (ref 15–41)
BUN: 9 mg/dL (ref 6–20)
CALCIUM: 9.5 mg/dL (ref 8.9–10.3)
CO2: 21 mmol/L — AB (ref 22–32)
Chloride: 101 mmol/L (ref 101–111)
Creatinine, Ser: 0.68 mg/dL (ref 0.44–1.00)
GFR calc Af Amer: >60 mL/min (ref >60)
GFR calc non Af Amer: >60 mL/min (ref >60)
GLUCOSE: 147 mg/dL — AB (ref 65–99)
Potassium: 3.6 mmol/L (ref 3.5–5.1)
SODIUM: 134 mmol/L — AB (ref 135–145)
Total Bilirubin: 0.9 mg/dL (ref 0.3–1.2)
Total Protein: 9.2 g/dL — ABNORMAL HIGH (ref 6.5–8.1)

## 2015-11-25 LAB — I-STAT BETA HCG BLOOD, ED (MC, WL, AP ONLY)

## 2015-11-25 LAB — ETHANOL: Alcohol, Ethyl (B): <5 mg/dL (ref <5)

## 2015-11-25 LAB — TRIGLYCERIDES: Triglycerides: 72 mg/dL (ref <150)

## 2015-11-25 LAB — LIPASE, BLOOD: Lipase: 550 U/L — ABNORMAL HIGH (ref 11–51)

## 2015-11-25 MED ORDER — KETOROLAC TROMETHAMINE 15 MG/ML IJ SOLN
15.0000 mg | Freq: Four times a day (QID) | INTRAMUSCULAR | Status: DC | PRN
  Administered 2015-11-25 – 2015-11-28 (×10): 15 mg via INTRAVENOUS
  Filled 2015-11-25 (×10): qty 1

## 2015-11-25 MED ORDER — SODIUM CHLORIDE 0.9 % IV BOLUS (SEPSIS)
1000.0000 mL | Freq: Once | INTRAVENOUS | Status: AC
  Administered 2015-11-25: 1000 mL via INTRAVENOUS

## 2015-11-25 MED ORDER — LORAZEPAM 1 MG PO TABS
0.0000 mg | ORAL_TABLET | Freq: Two times a day (BID) | ORAL | Status: DC
  Administered 2015-11-27: 1 mg via ORAL
  Filled 2015-11-25: qty 1

## 2015-11-25 MED ORDER — LORAZEPAM 1 MG PO TABS
0.0000 mg | ORAL_TABLET | ORAL | Status: AC | PRN
  Administered 2015-11-27: 2 mg via ORAL
  Filled 2015-11-25: qty 2

## 2015-11-25 MED ORDER — ENOXAPARIN SODIUM 40 MG/0.4ML ~~LOC~~ SOLN
40.0000 mg | SUBCUTANEOUS | Status: DC
  Administered 2015-11-25 – 2015-11-28 (×4): 40 mg via SUBCUTANEOUS
  Filled 2015-11-25 (×4): qty 0.4

## 2015-11-25 MED ORDER — PROMETHAZINE HCL 25 MG/ML IJ SOLN
25.0000 mg | Freq: Four times a day (QID) | INTRAMUSCULAR | Status: DC | PRN
  Administered 2015-11-25 – 2015-11-29 (×7): 25 mg via INTRAVENOUS
  Filled 2015-11-25 (×7): qty 1

## 2015-11-25 MED ORDER — ONDANSETRON HCL 4 MG/2ML IJ SOLN
4.0000 mg | Freq: Once | INTRAMUSCULAR | Status: AC
  Administered 2015-11-25: 4 mg via INTRAVENOUS
  Filled 2015-11-25: qty 2

## 2015-11-25 MED ORDER — SODIUM CHLORIDE 0.9 % IV SOLN
Freq: Once | INTRAVENOUS | Status: AC
  Administered 2015-11-25: 16:00:00 via INTRAVENOUS

## 2015-11-25 MED ORDER — ADULT MULTIVITAMIN W/MINERALS CH
1.0000 | ORAL_TABLET | Freq: Every day | ORAL | Status: DC
  Administered 2015-11-25 – 2015-11-29 (×4): 1 via ORAL
  Filled 2015-11-25 (×5): qty 1

## 2015-11-25 MED ORDER — HYDROMORPHONE HCL 1 MG/ML IJ SOLN
0.5000 mg | Freq: Once | INTRAMUSCULAR | Status: AC
  Administered 2015-11-25: 0.5 mg via INTRAVENOUS
  Filled 2015-11-25: qty 1

## 2015-11-25 MED ORDER — OXYCODONE HCL 5 MG PO TABS
5.0000 mg | ORAL_TABLET | ORAL | Status: DC | PRN
  Administered 2015-11-25 – 2015-11-29 (×10): 5 mg via ORAL
  Filled 2015-11-25 (×10): qty 1

## 2015-11-25 MED ORDER — ONDANSETRON HCL 4 MG/2ML IJ SOLN
4.0000 mg | Freq: Four times a day (QID) | INTRAMUSCULAR | Status: DC | PRN
  Administered 2015-11-26 – 2015-11-28 (×5): 4 mg via INTRAVENOUS
  Filled 2015-11-25 (×5): qty 2

## 2015-11-25 MED ORDER — LORAZEPAM 2 MG/ML IJ SOLN: 1.0000 mg | Freq: Four times a day (QID) | INTRAMUSCULAR | Status: AC | PRN

## 2015-11-25 MED ORDER — THIAMINE HCL 100 MG/ML IJ SOLN: 100.0000 mg | Freq: Every day | INTRAMUSCULAR | Status: DC

## 2015-11-25 MED ORDER — LORAZEPAM 1 MG PO TABS
1.0000 mg | ORAL_TABLET | Freq: Four times a day (QID) | ORAL | Status: AC | PRN
  Administered 2015-11-27: 1 mg via ORAL
  Filled 2015-11-25: qty 1

## 2015-11-25 MED ORDER — VITAMIN B-1 100 MG PO TABS
100.0000 mg | ORAL_TABLET | Freq: Every day | ORAL | Status: DC
  Administered 2015-11-25 – 2015-11-29 (×4): 100 mg via ORAL
  Filled 2015-11-25 (×5): qty 1

## 2015-11-25 MED ORDER — FOLIC ACID 1 MG PO TABS
1.0000 mg | ORAL_TABLET | Freq: Every day | ORAL | Status: DC
  Administered 2015-11-25 – 2015-11-29 (×4): 1 mg via ORAL
  Filled 2015-11-25 (×5): qty 1

## 2015-11-25 MED ORDER — ONDANSETRON HCL 4 MG PO TABS: 4.0000 mg | ORAL_TABLET | Freq: Four times a day (QID) | ORAL | Status: DC | PRN

## 2015-11-25 MED ORDER — SODIUM CHLORIDE 0.9 % IV SOLN
INTRAVENOUS | Status: DC
  Administered 2015-11-25 – 2015-11-29 (×9): via INTRAVENOUS

## 2015-11-25 MED ORDER — BOOST / RESOURCE BREEZE PO LIQD: 1.0000 | Freq: Three times a day (TID) | ORAL | Status: DC

## 2015-11-25 NOTE — ED Notes (Signed)
Ultrasound at the bedside

## 2015-11-25 NOTE — ED Notes (Signed)
Pt has tried for UA multiple times.   Most recent time collected 1mL.   Lab called back and said it was not enough.  Fluids has been encouraged

## 2015-11-25 NOTE — ED Notes (Signed)
WILL TRANSPORT PT TO 5E 1502-1. AAOX4. PT IN NO APPARENT DISTRESS. THE OPPORTUNITY TO ASK QUESTIONS WAS PROVIDED.

## 2015-11-25 NOTE — Progress Notes (Signed)
Patient vomited, after receiving medications by mouth.  Vomit was witnessed by this nurse.  Dr. Mahala MenghiniSamtani notified, with orders for phenergan, and npo.

## 2015-11-25 NOTE — ED Triage Notes (Signed)
Patient is complaining of epigastric pain that radiates to her upper ribs, x 3 days.  Pain is associated with N/V.  Patient states she has chilis but no fever. She is A & O x4.  Patient has been taking aspirin for pain.  EMS administered 8 mg of Zofran    BP:134/90 HR:110 R:16 O2: 100% on room air  CBG:122

## 2015-11-25 NOTE — H&P (Signed)
[facility] History and Physical  Hannah KinnierKonisha Hardy RUE:[id number]:[id number] DOB: [birth year] DOA: 11/25/2015  Referring physician: Corlis LeakMacKuen PCP: No PCP Per Patient  Specialists: none  Chief Complaint:  abd pain  HPI:   25 year old female with bland medical history but chronic every other day drinker comes to the hospital 11/25/2015 with abdominal pain lasting for the past 2 days States that she drinks vodka shots and bear every other day and has heavy EtOH use States she also has had history of gallstone attacks about 4 years ago She is currently unemployed and does not work outside the home She went to and went through 11th grade  [place] in [place] the past 4 years  Tells me that she's had nausea vomiting is not been able to keep anything down No dietary No ill contacts No dysuria No hematuria No diarrhea No blurred vision no double vision   Emergency room workup revealed lipase 550 LFTs within normal limits Hemoglobin 11.9 MCV 77   Review of Systems: Negative except as above Past Medical History:  Diagnosis Date  . Asthma    History reviewed. No pertinent surgical history. Social History:  Social History   Social History Narrative  . No narrative on file    No Known Allergies  No family history on file.  MOthe rDM Father smoker  Prior to Admission medications   Medication Sig Start Date End Date Taking? Authorizing Provider  aspirin EC 81 MG tablet Take 243 mg by mouth every 4 (four) hours as needed for mild pain, moderate pain or fever.    Yes Historical Provider, MD  predniSONE (DELTASONE) 50 MG tablet Take once a day for 5 days Patient not taking: Reported on 11/25/2015 09/09/15   Dub MikesSamantha Tripp Dowless, PA-C   Physical Exam: Vitals:   11/25/15 1046 11/25/15 1048 11/25/15 1427  BP:  141/81 139/70  Pulse:  99 85  Resp:  20 18  Temp:  97.6 F (36.4 C) 97.9 F (36.6 C)  TempSrc:  Oral Oral  SpO2:  100% 100%  Weight: 88.9 kg (196 lb)    Height:  5\' 8"  (1.727 m)      EOMI NCAT somnolent but arousable and cooperative [name] 3 S1-S2 no murmur rub or gallop Chest clinically clear Central abdominal pain Slightly distended abdomen Tattoo on right foot No lower extremity edema Range of motion intact   Labs on Admission:  Basic Metabolic Panel:  Recent Labs Lab 11/25/15 1237  NA 134*  K 3.6  CL 101  CO2 21*  GLUCOSE 147*  BUN 9  CREATININE 0.68  CALCIUM 9.5   Liver Function Tests:  Recent Labs Lab 11/25/15 1237  AST 29  ALT 17  ALKPHOS 58  BILITOT 0.9  PROT 9.2*  ALBUMIN 4.7    Recent Labs Lab 11/25/15 1237  LIPASE 550*   No results for input(s): AMMONIA in the last 168 hours. CBC:  Recent Labs Lab 11/25/15 1237  WBC 9.7  NEUTROABS 8.1*  HGB 11.9*  HCT 34.5*  MCV 77.0*  PLT 225   Cardiac Enzymes: No results for input(s): CKTOTAL, CKMB, CKMBINDEX, TROPONINI in the last 168 hours.  BNP (last 3 results) No results for input(s): BNP in the last 8760 hours.  ProBNP (last 3 results) No results for input(s): PROBNP in the last 8760 hours.  CBG: No results for input(s): GLUCAP in the last 168 hours.  Radiological Exams on Admission: Koreas Abdomen Limited Ruq  Result Date: 11/25/2015 CLINICAL DATA:  Three days of  abdominal pain. Possible acute pancreatitis. EXAM: US ABDOMEN LIMITED - RIGHT UPPER QUADRANT COMPARISON:  1.9 mm FINDINGS: Gallbladder: The gallbladder is adequately distended. There are echogenic mobile shadowing stones measuring up to 5 mm in diameter. There is no gallbladder wall thickening, pericholecystic fluid, or positive sonographic Murphy's sign. Common bile duct: Diameter: 1.9 mm Liver: The hepatic echotexture is normal. There is no focal mass nor ductal dilation. IMPRESSION: Gallstones without sonographic evidence of acute cholecystitis. Normal appearance of the liver and common bile duct. Electronically Signed   By: David  SwazilandJordan M.D.   On: 11/25/2015 15:22    EKG: Independently  reviewed. none  Assessment/Plan  Probable gallstone versus alcoholic pancreatitis Ultrasound of abdomen performed was negative although shows some stones that are nonobstructive Proceed to HIDA scan and if abnormal recommend consultation with general surgery and GI Follow TG ordered clear liquid diet to advance if tol Rpt lipase am zofran for n/v toradol for pain with NOrco or oral equivalentformild pain  Ethanolism Monitor on CIWA protocol  morbid obesity Body mass index is 29.8 kg/m. Needs outpatient counseling  Impaired glucose tolerance Monitor blood sugars with a.m. Labs  Slight elevation of total protein needs workup as an outpatient if continues  Microcytic anemia likely secondary to periods in the menstruating female  Full code Inpatient Med surg 3-4days   Rhetta MuraSAMTANI, Hannah Hardy [facility] Pager [phone]  If 7PM-7AM, please contact night-coverage [email or web address] Password [email or web address] 11/25/2015, 3:56 PM

## 2015-11-25 NOTE — ED Provider Notes (Signed)
[facility]-EMERGENCY DEPT Provider Note   CSN: [id number] Arrival date & time: 11/25/15  1028     History   Chief Complaint Chief Complaint  Patient presents with  . Abdominal Pain    HPI Hannah KinnierKonisha Hardy is a 25 y.o. female.  HPI   Patient is 25 year old female presenting with abdominal pain. Patient reports 3 days of abdominal pain. She reports that all over. She reports vomiting. She has noticed some bright red blood in her vomit after vomiting multiple times. Patient unable to eat much. No diarrhea. No fevers. Patient does smoke weed.  Past Medical History:  Diagnosis Date  . Asthma     Patient Active Problem List   Diagnosis Date Noted  . Cholelithiasis 11/26/2015  . Alcohol abuse 11/26/2015  . Pancreatitis 11/25/2015    History reviewed. No pertinent surgical history.  OB History    No data available       Home Medications    Prior to Admission medications   Medication Sig Start Date End Date Taking? Authorizing Provider  ondansetron (ZOFRAN) 4 MG tablet Take 1 tablet (4 mg total) by mouth every 6 (six) hours as needed for nausea. 11/29/15   Leana Roeawood S Elgergawy, MD  oxyCODONE (OXY IR/ROXICODONE) 5 MG immediate release tablet Take 1 tablet (5 mg total) by mouth every 6 (six) hours as needed for severe pain. 11/29/15   Starleen Armsawood S Elgergawy, MD    Family History No family history on file.  Social History Social History  Substance Use Topics  . Smoking status: Current Every Day Smoker    Packs/day: 1.00    Types: Cigarettes  . Smokeless tobacco: Never Used  . Alcohol use Yes     Comment: occ     Allergies   Patient has no known allergies.   Review of Systems Review of Systems  Constitutional: Positive for fatigue. Negative for fever.  Gastrointestinal: Positive for abdominal pain and vomiting. Negative for constipation and diarrhea.  Genitourinary: Negative for dysuria.     Physical Exam Updated Vital Signs BP (!) 152/97 (BP Location: Right Arm)    Pulse 85   Temp 98.2 F (36.8 C) (Oral)   Resp 18   Ht 5\' 8"  (1.727 m)   Wt 194 lb 3.6 oz (88.1 kg)   LMP 11/21/2015   SpO2 98%   BMI 29.53 kg/m   Physical Exam  Constitutional: She is oriented to person, place, and time. She appears well-developed and well-nourished.  HENT:  Head: Normocephalic and atraumatic.  Eyes: Right eye exhibits no discharge.  Cardiovascular: Normal rate, regular rhythm and normal heart sounds.   No murmur heard. Pulmonary/Chest: Effort normal and breath sounds normal. She has no wheezes. She has no rales.  Abdominal: Soft. She exhibits no distension. There is tenderness.  Neurological: She is oriented to person, place, and time.  Skin: Skin is warm and dry. She is not diaphoretic.  Psychiatric: She has a normal mood and affect.  Nursing note and vitals reviewed.    ED Treatments / Results  Labs (all labs ordered are listed, but only abnormal results are displayed) Labs Reviewed  COMPREHENSIVE METABOLIC PANEL - Abnormal; Notable for the following:       Result Value   Sodium 134 (*)    CO2 21 (*)    Glucose, Bld 147 (*)    Total Protein 9.2 (*)    All other components within normal limits  CBC WITH DIFFERENTIAL/PLATELET - Abnormal; Notable for the following:    Hemoglobin  11.9 (*)    HCT 34.5 (*)    MCV 77.0 (*)    RDW 17.9 (*)    Neutro Abs 8.1 (*)    All other components within normal limits  LIPASE, BLOOD - Abnormal; Notable for the following:    Lipase 550 (*)    All other components within normal limits  COMPREHENSIVE METABOLIC PANEL - Abnormal; Notable for the following:    Glucose, Bld 106 (*)    All other components within normal limits  CBC - Abnormal; Notable for the following:    WBC 10.6 (*)    RBC 3.79 (*)    Hemoglobin 10.2 (*)    HCT 29.7 (*)    RDW 17.8 (*)    All other components within normal limits  GLUCOSE, CAPILLARY - Abnormal; Notable for the following:    Glucose-Capillary 102 (*)    All other components  within normal limits  LIPASE, BLOOD - Abnormal; Notable for the following:    Lipase 260 (*)    All other components within normal limits  COMPREHENSIVE METABOLIC PANEL - Abnormal; Notable for the following:    Potassium 3.3 (*)    Calcium 8.6 (*)    Albumin 3.3 (*)    ALT 12 (*)    All other components within normal limits  LIPASE, BLOOD - Abnormal; Notable for the following:    Lipase 99 (*)    All other components within normal limits  CBC - Abnormal; Notable for the following:    RBC 3.60 (*)    Hemoglobin 9.5 (*)    HCT 28.4 (*)    RDW 17.3 (*)    All other components within normal limits  COMPREHENSIVE METABOLIC PANEL - Abnormal; Notable for the following:    Sodium 134 (*)    CO2 20 (*)    Calcium 8.5 (*)    Albumin 3.1 (*)    ALT 10 (*)    All other components within normal limits  LIPASE, BLOOD - Abnormal; Notable for the following:    Lipase 58 (*)    All other components within normal limits  ETHANOL  TRIGLYCERIDES  PROTIME-INR  GLUCOSE, CAPILLARY  I-STAT BETA HCG BLOOD, ED ([facility], [facility], [facility] ONLY)    EKG  EKG Interpretation None       Radiology No results found.  Procedures Procedures (including critical care time)  Medications Ordered in ED Medications  enoxaparin (LOVENOX) injection 40 mg (40 mg Subcutaneous Given 11/28/15 1750)  0.9 %  sodium chloride infusion ( Intravenous New Bag/Given 11/29/15 1238)  oxyCODONE (Oxy IR/ROXICODONE) immediate release tablet 5 mg (5 mg Oral Given 11/29/15 0211)  ketorolac (TORADOL) 15 MG/ML injection 15 mg (15 mg Intravenous Given 11/28/15 2242)  ondansetron (ZOFRAN) tablet 4 mg ( Oral See Alternative 11/28/15 2104)    Or  ondansetron (ZOFRAN) injection 4 mg (4 mg Intravenous Given 11/28/15 2104)  LORazepam (ATIVAN) tablet 1 mg (1 mg Oral Given 11/27/15 1800)    Or  LORazepam (ATIVAN) injection 1 mg ( Intravenous See Alternative 11/27/15 1800)  thiamine (VITAMIN B-1) tablet 100 mg (100 mg Oral Given 11/29/15 1024)      Or  thiamine (B-1) injection 100 mg ( Intravenous See Alternative 11/29/15 1024)  folic acid (FOLVITE) tablet 1 mg (1 mg Oral Given 11/29/15 1024)  multivitamin with minerals tablet 1 tablet (1 tablet Oral Given 11/29/15 1024)  LORazepam (ATIVAN) tablet 0-4 mg (2 mg Oral Given 11/27/15 0259)    Followed by  LORazepam (ATIVAN)  tablet 0-4 mg (0 mg Oral Not Given 11/29/15 0515)  feeding supplement (BOOST / RESOURCE BREEZE) liquid 1 Container (1 Container Oral Not Given 11/29/15 1025)  promethazine (PHENERGAN) injection 25 mg (25 mg Intravenous Given 11/29/15 0211)  nicotine (NICODERM CQ - dosed in mg/24 hours) patch 21 mg (21 mg Transdermal Patch Applied 11/29/15 1016)  sodium chloride 0.9 % bolus 1,000 mL (0 mLs Intravenous Stopped 11/25/15 1331)  HYDROmorphone (DILAUDID) injection 0.5 mg (0.5 mg Intravenous Given 11/25/15 1231)  ondansetron (ZOFRAN) injection 4 mg (4 mg Intravenous Given 11/25/15 1231)  HYDROmorphone (DILAUDID) injection 0.5 mg (0.5 mg Intravenous Given 11/25/15 1452)  sodium chloride 0.9 % bolus 1,000 mL (0 mLs Intravenous Stopped 11/25/15 1634)  0.9 %  sodium chloride infusion ( Intravenous Transfusing/Transfer 11/25/15 1656)  morphine 2 MG/ML injection 2 mg (2 mg Intravenous Given 11/26/15 2341)  potassium chloride SA (K-DUR,KLOR-CON) CR tablet 40 mEq (40 mEq Oral Given 11/27/15 1320)     Initial Impression / Assessment and Plan / ED Course  I have reviewed the triage vital signs and the nursing notes.  Pertinent labs & imaging results that were available during my care of the patient were reviewed by me and considered in my medical decision making (see chart for details).  Clinical Course     Patient is a 25 year old female presenting with abdominal pain. Patient unable to tell where the pain is located. She just points mostly epigastric right upper quadrant. Patient reports no diarrhea or constipation. We will get labs, patient asking for pain medications. No flank  pain.  Lipase very elevated  Will get [place] given pain in RUQ. Though normal AST and ALT>  Will get triglyucerides and etoh.   Will admit for pancreatitis given degree of symptoms- pain control and fluids needed.     Final Clinical Impressions(s) / ED Diagnoses   Final diagnoses:  Pancreatitis    New Prescriptions Discharge Medication List as of 11/29/2015  3:07 PM    START taking these medications   Details  ondansetron (ZOFRAN) 4 MG tablet Take 1 tablet (4 mg total) by mouth every 6 (six) hours as needed for nausea., Starting Mon 11/29/2015, Print    oxyCODONE (OXY IR/ROXICODONE) 5 MG immediate release tablet Take 1 tablet (5 mg total) by mouth every 6 (six) hours as needed for severe pain., Starting Mon 11/29/2015, Print         Yesenia Fontenette Randall An, MD 11/29/15 [id number]

## 2015-11-25 NOTE — Progress Notes (Signed)
Pt was listed with an out of stated medicaid but confirms with [facility] ED CM today that she now has "regular medicaid in [place]" but has not obtained a medicaid pcp  CM provided pt with a [place] of [place] providers and discussed that with medicaid of [place] she is able to call and find an accepting medicaid provider to go to without having to have DSS enter medicaid MD in data base Pt voiced understanding

## 2015-11-25 NOTE — Progress Notes (Signed)
Entered in d/c instructions Please use the list of medicaid [place] doctors provided to you in emergency room by case manager to assist you're your choice of doctor for follow up         Next Steps: Schedule an appointment as soon as possible for a visit

## 2015-11-26 DIAGNOSIS — F1010 Alcohol abuse, uncomplicated: Secondary | ICD-10-CM

## 2015-11-26 DIAGNOSIS — K8020 Calculus of gallbladder without cholecystitis without obstruction: Secondary | ICD-10-CM

## 2015-11-26 LAB — CBC
HCT: 29.7 % — ABNORMAL LOW (ref 36.0–46.0)
Hemoglobin: 10.2 g/dL — ABNORMAL LOW (ref 12.0–15.0)
MCH: 26.9 pg (ref 26.0–34.0)
MCHC: 34.3 g/dL (ref 30.0–36.0)
MCV: 78.4 fL (ref 78.0–100.0)
PLATELETS: 176 10*3/uL (ref 150–400)
RBC: 3.79 MIL/uL — ABNORMAL LOW (ref 3.87–5.11)
RDW: 17.8 % — AB (ref 11.5–15.5)
WBC: 10.6 10*3/uL — AB (ref 4.0–10.5)

## 2015-11-26 LAB — LIPASE, BLOOD: Lipase: 260 U/L — ABNORMAL HIGH (ref 11–51)

## 2015-11-26 LAB — COMPREHENSIVE METABOLIC PANEL
ALBUMIN: 3.8 g/dL (ref 3.5–5.0)
ALK PHOS: 46 U/L (ref 38–126)
ALT: 14 U/L (ref 14–54)
AST: 20 U/L (ref 15–41)
Anion gap: 8 (ref 5–15)
BILIRUBIN TOTAL: 0.6 mg/dL (ref 0.3–1.2)
BUN: 10 mg/dL (ref 6–20)
CALCIUM: 9 mg/dL (ref 8.9–10.3)
CO2: 23 mmol/L (ref 22–32)
CREATININE: 0.63 mg/dL (ref 0.44–1.00)
Chloride: 107 mmol/L (ref 101–111)
GFR calc Af Amer: >60 mL/min (ref >60)
GLUCOSE: 106 mg/dL — AB (ref 65–99)
POTASSIUM: 3.8 mmol/L (ref 3.5–5.1)
Sodium: 138 mmol/L (ref 135–145)
TOTAL PROTEIN: 7.3 g/dL (ref 6.5–8.1)

## 2015-11-26 LAB — PROTIME-INR
INR: 0.99
PROTHROMBIN TIME: 13.1 s (ref 11.4–15.2)

## 2015-11-26 LAB — GLUCOSE, CAPILLARY: GLUCOSE-CAPILLARY: 102 mg/dL — AB (ref 65–99)

## 2015-11-26 MED ORDER — MORPHINE SULFATE (PF) 2 MG/ML IV SOLN
2.0000 mg | Freq: Once | INTRAVENOUS | Status: AC
Start: 2015-11-26 — End: 2015-11-26
  Administered 2015-11-26: 2 mg via INTRAVENOUS
  Filled 2015-11-26: qty 1

## 2015-11-26 NOTE — Progress Notes (Signed)
PROGRESS NOTE                                                                                                                                                                                                             Patient Demographics:    Hannah Hardy, is a 25 y.o. female, DOB - 12/23/1990, BJY:[id number]  Admit date - 11/25/2015   Admitting Physician Rhetta Mura, MD  Outpatient Primary MD for the patient is No PCP Per Patient  LOS - 1    Chief Complaint  Patient presents with  . Abdominal Pain       Brief Narrative   25 year old female with history of cholelithiasis, obesity, alcohol abuse, who presents with complaints of abdominal pain, has elevated lipase level, admitted for acute pancreatitis.   Subjective:    Hannah Hardy today has, No headache, No chest pain, No shortness of breath or cough, complaints of abdominal pain, nausea, but no vomiting  Assessment  & Plan :    Active Problems:   Pancreatitis   Cholelithiasis   Alcohol abuse   Acute pancreatitis - This is most likely alcoholic pancreatitis, given her significant alcohol use. - She remains with significant pain today, continue with when necessary pain and nausea medication, keep nothing by mouth, continue with IV fluids. - Abdominal ultrasound significant for cholelithiasis, but gallstones unlikely cause for her acute pancreatitis given bile duct within normal limits, LFTs within normal limit, this is most likely incidental finding, surgery consulted to evaluate for need of laparoscopic cholecystectomy  Cholelithiasis - Please see above discussion  Alcohol abuse - Continuously IWA protocol   Code Status : Full  Family Communication  : None  Disposition Plan  : Home when stable  Consults  :  General surgery  Procedures  : None  DVT Prophylaxis  :  Lovenox -  SCDs Lab Results  Component Value Date   PLT 176 11/26/2015    Antibiotics   :  Anti-infectives    None        Objective:   Vitals:   11/25/15 1653 11/25/15 1712 11/25/15 2046 11/26/15 0503  BP: 125/78 (!) 134/92 126/76 122/71  Pulse: (!) 50 70 74 72  Resp: 17 18 18 18   Temp: 98.1 F (36.7 C) 98 F (36.7 C) 97.7 F (36.5 C) 98 F (36.7 C)  TempSrc:  Oral Oral Oral Oral  SpO2: 99% 97% 94% 100%  Weight:      Height:        Wt Readings from Last 3 Encounters:  11/25/15 88.9 kg (196 lb)  09/09/15 88.9 kg (196 lb)  12/30/12 99.4 kg (219 lb 3.2 oz)     Intake/Output Summary (Last 24 hours) at 11/26/15 1451 Last data filed at 11/26/15 1158  Gross per 24 hour  Intake          2460.42 ml  Output                1 ml  Net          2459.42 ml     Physical Exam  Awake Alert, Oriented X 3, No new F.N deficits, Normal affect [place].AT,PERRAL Supple Neck,No JVD, No cervical lymphadenopathy appriciated.  Symmetrical Chest wall movement, Good air movement bilaterally, CTAB RRR,No Gallops,Rubs or new Murmurs, No Parasternal Heave +ve B.Sounds, Abd Soft, significant tenderness in mid abdomen, No rebound - guarding or rigidity. No Cyanosis, Clubbing or edema, No new Rash or bruise      Data Review:    CBC  Recent Labs Lab 11/25/15 1237 11/26/15 0455  WBC 9.7 10.6*  HGB 11.9* 10.2*  HCT 34.5* 29.7*  PLT 225 176  MCV 77.0* 78.4  MCH 26.6 26.9  MCHC 34.5 34.3  RDW 17.9* 17.8*  LYMPHSABS 0.8  --   MONOABS 0.8  --   EOSABS 0.0  --   BASOSABS 0.0  --     Chemistries   Recent Labs Lab 11/25/15 1237 11/26/15 0455  NA 134* 138  K 3.6 3.8  CL 101 107  CO2 21* 23  GLUCOSE 147* 106*  BUN 9 10  CREATININE 0.68 0.63  CALCIUM 9.5 9.0  AST 29 20  ALT 17 14  ALKPHOS 58 46  BILITOT 0.9 0.6   ------------------------------------------------------------------------------------------------------------------  Recent Labs  11/25/15 1237  TRIG 72    No results found for:  HGBA1C ------------------------------------------------------------------------------------------------------------------ No results for input(s): TSH, T4TOTAL, T3FREE, THYROIDAB in the last 72 hours.  Invalid input(s): FREET3 ------------------------------------------------------------------------------------------------------------------ No results for input(s): VITAMINB12, FOLATE, FERRITIN, TIBC, IRON, RETICCTPCT in the last 72 hours.  Coagulation profile  Recent Labs Lab 11/26/15 0455  INR 0.99    No results for input(s): DDIMER in the last 72 hours.  Cardiac Enzymes No results for input(s): CKMB, TROPONINI, MYOGLOBIN in the last 168 hours.  Invalid input(s): CK ------------------------------------------------------------------------------------------------------------------ No results found for: BNP  Inpatient Medications  Scheduled Meds: . enoxaparin (LOVENOX) injection  40 mg Subcutaneous Q24H  . feeding supplement  1 Container Oral TID BM  . folic acid  1 mg Oral Daily  . [START ON 11/28/2015] LORazepam  0-4 mg Oral Q12H  . multivitamin with minerals  1 tablet Oral Daily  . thiamine  100 mg Oral Daily   Or  . thiamine  100 mg Intravenous Daily   Continuous Infusions: . sodium chloride 125 mL/hr at 11/26/15 0727   PRN Meds:.ketorolac, LORazepam **OR** LORazepam, LORazepam **FOLLOWED BY** [START ON 11/28/2015] LORazepam, ondansetron **OR** ondansetron (ZOFRAN) IV, oxyCODONE, promethazine  Micro Results No results found for this or any previous visit (from the past 240 hour(s)).  Radiology Reports US Abdomen Limited Ruq  Result Date: 11/25/2015 CLINICAL DATA:  Three days of abdominal pain. Possible acute pancreatitis. EXAM: US ABDOMEN LIMITED - RIGHT UPPER QUADRANT COMPARISON:  1.9 mm FINDINGS: Gallbladder: The gallbladder is adequately distended. There are echogenic mobile shadowing stones  measuring up to 5 mm in diameter. There is no gallbladder wall thickening,  pericholecystic fluid, or positive sonographic Murphy's sign. Common bile duct: Diameter: 1.9 mm Liver: The hepatic echotexture is normal. There is no focal mass nor ductal dilation. IMPRESSION: Gallstones without sonographic evidence of acute cholecystitis. Normal appearance of the liver and common bile duct. Electronically Signed   By: David  [place] M.D.   On: 11/25/2015 15:22     Tatum Corl M.D on 11/26/2015 at 2:51 PM  Between 7am to 7pm - Pager - [phone]  After 7pm go to [email or web address] - password [facility]  [facility] -  Office  [phone]

## 2015-11-26 NOTE — Progress Notes (Signed)
Initial Nutrition Assessment  DOCUMENTATION CODES:   Not applicable  INTERVENTION:  Advance diet per MD and GI.  Continue Boost Breeze po TID, each supplement provides 250 kcal and 9 grams of protein. Likely ordered per MST protocol, but can continue in anticipation of diet advancement to CLD.  Noted in admission note that patient was diagnosed with morbid obesity. Patient's BMI is 29.8, so she is Overweight (not even Obesity Class I).  NUTRITION DIAGNOSIS:   Increased nutrient needs related to catabolic illness (acute pancreatitis) as evidenced by estimated needs.  GOAL:   Patient will meet greater than or equal to 90% of their needs  MONITOR:   Diet advancement, Labs, Weight trends, I & O's  REASON FOR ASSESSMENT:   Malnutrition Screening Tool    ASSESSMENT:   25 y/o female with a PMH of cholelithiasis, obesity, and alcohol use who [facility] with three days of abdominal pain. Per patient, she drinks 3 shots of vodka and 4+ beers every other night. GI suspects acute pancreatitis secondary to EtOH use.   Spoke with patient at bedside. She reports poor appetite for 3 days due to N/V and abdominal pain. Prior to that she typically eats 2 meals daily. She is reporting UBW 205 lbs and that she has lost 9 lbs over the past few weeks (4% body weight). If this weight loss did occur, would not be significant for time frame. However, patient weighed 196 lbs at ED visit on 09/09/2015. No other weight history in chart.  Medications reviewed and include: folic acid 1 mg daily, multivitamin with minerals 1 tablet daily, thiamine 100 mg daily, NS @ 125 ml/hr, Phenergan Q6hrs PRN for nausea, Zofran 4 mg Q6hrs PRN, Oxycodone PRN.  Labs reviewed: CBG 102, Lipase 260 (trending down).  Nutrition-Focused physical exam completed. Findings are no fat depletion, no muscle depletion, and no edema.   Discussed with RN. Patient may advance to CLD later today or tomorrow.  Diet Order:   Diet NPO time specified  Skin:  Reviewed, no issues  Last BM:  11/26/2015  Height:   Ht Readings from Last 1 Encounters:  11/25/15 5\' 8"  (1.727 m)    Weight:   Wt Readings from Last 1 Encounters:  11/25/15 196 lb (88.9 kg)    Ideal Body Weight:  63.64 kg  BMI:  Body mass index is 29.8 kg/m.  Estimated Nutritional Needs:   Kcal:  2180-2520 (MSJ x 1.3-1.5)  Protein:  >/= 130 grams (1.5 grams/kg)  Fluid:  >/= 2.6 L/day (30 ml/kg)  EDUCATION NEEDS:   Education needs no appropriate at this time  Helane RimaLeanne Ajdin Macke, MS, RD, LDN Pager: [phone] After Hours Pager: [phone]

## 2015-11-26 NOTE — Consult Note (Signed)
[facility] Surgery Consult Note  Hannah Hardy 1990-03-30  [id number].    Requesting MD: Waldron Labs, MD Chief Complaint/Reason for Consult: acute pancreatitis, cholelithiasis   HPI:  25 y/o female with a PMH of cholelithiasis, obesity, and alcohol use who presents to [facility] hospital with three days of abdominal pain. Pain is described as located in her central, upper abdomen and is burning/sharp in quality with intermittent radiation to her back. She has had similar pain in the past, much less severe, secondary to gallstones. Associated symptoms include nausea and vomiting for 3 days. Patient reports streaks of blood in her vomit the past 2 days and, this morning, when she threw up she reports large mouthfulls of blood. Denies melena or hematochezia. Diagnosed with symptomatic cholelithiasis in 2013 at [facility], discharged with outpatient surgical referral but never followed up. Per patient, she drinks 3 shots of vodka and 4+ beers every other night. Past abdominal surgeries include a cesarean section about 8 years ago. Denies use of blood thinners. Denies PMH HTN, DM, MI, or CVA.  Denies HA, visual changes, SOB, CP, diarrhea, melena, hematochezia, or urinary symptoms.  ROS: All systems reviewed and otherwise negative except for as above  No family history on file.  Past Medical History:  Diagnosis Date  . Asthma     History reviewed. No pertinent surgical history.  Social History:  reports that she has been smoking Cigarettes.  She has been smoking about 1.00 pack per day. She has never used smokeless tobacco. She reports that she drinks alcohol. She reports that she uses drugs, including Marijuana.  Allergies: No Known Allergies  Medications Prior to Admission  Medication Sig Dispense Refill  . aspirin EC 81 MG tablet Take 243 mg by mouth every 4 (four) hours as needed for mild pain, moderate pain or fever.     . predniSONE (DELTASONE) 50 MG tablet Take once a day for 5 days  (Patient not taking: Reported on 11/25/2015) 5 tablet 0    Blood pressure 122/71, pulse 72, temperature 98 F (36.7 C), temperature source Oral, resp. rate 18, height _0  (1.727 m), weight 196 lb (88.9 kg), last menstrual period 11/21/2015, SpO2 100 %. Physical Exam: General: pleasant, obese AA female who is laying in bed in NAD HEENT: head is normocephalic, atraumatic. Mouth is pink and moist Heart: regular, rate, and rhythm.  No obvious murmurs, gallops, or rubs noted.  Palpable pedal pulses bilaterally Lungs: CTABL, no wheezes, rhonchi, or rales noted.  Respiratory effort nonlabored Abd: obese, soft, diffusely tender with significant tenderness over epigastrium, +guarding, hypoactive BS, no masses, hernias, or organomegaly. No peritonitis. Abdominal striae noted. MS: all 4 extremities are symmetrical with no cyanosis, clubbing, or edema. Skin: warm and dry with no masses, lesions, or rashes Psych: A&Ox3 with an appropriate affect. Neuro: moves all extremities spontaneously, normal speech  Results for orders placed or performed during the hospital encounter of 11/25/15 (from the past 48 hour(s))  Comprehensive metabolic panel     Status: Abnormal   Collection Time: 11/25/15 12:37 PM  Result Value Ref Range   Sodium 134 (L) 135 - 145 mmol/L   Potassium 3.6 3.5 - 5.1 mmol/L   Chloride 101 101 - 111 mmol/L   CO2 21 (L) 22 - 32 mmol/L   Glucose, Bld 147 (H) 65 - 99 mg/dL   BUN 9 6 - 20 mg/dL   Creatinine, Ser 0.68 0.44 - 1.00 mg/dL   Calcium 9.5 8.9 - 10.3 mg/dL   Total Protein 9.2 (  H) 6.5 - 8.1 g/dL   Albumin 4.7 3.5 - 5.0 g/dL   AST 29 15 - 41 U/L   ALT 17 14 - 54 U/L   Alkaline Phosphatase 58 38 - 126 U/L   Total Bilirubin 0.9 0.3 - 1.2 mg/dL   GFR calc non Af Amer >60 >60 mL/min   GFR calc Af Amer >60 >60 mL/min    Comment: (NOTE) The eGFR has been calculated using the CKD EPI equation. This calculation has not been validated in all clinical situations. eGFR's persistently  <60 mL/min signify possible Chronic Kidney Disease.    Anion gap 12 5 - 15  CBC with Differential/Platelet     Status: Abnormal   Collection Time: 11/25/15 12:37 PM  Result Value Ref Range   WBC 9.7 4.0 - 10.5 K/uL   RBC 4.48 3.87 - 5.11 MIL/uL   Hemoglobin 11.9 (L) 12.0 - 15.0 g/dL   HCT 34.5 (L) 36.0 - 46.0 %   MCV 77.0 (L) 78.0 - 100.0 fL   MCH 26.6 26.0 - 34.0 pg   MCHC 34.5 30.0 - 36.0 g/dL   RDW 17.9 (H) 11.5 - 15.5 %   Platelets 225 150 - 400 K/uL   Neutrophils Relative % 83 %   Neutro Abs 8.1 (H) 1.7 - 7.7 K/uL   Lymphocytes Relative 9 %   Lymphs Abs 0.8 0.7 - 4.0 K/uL   Monocytes Relative 8 %   Monocytes Absolute 0.8 0.1 - 1.0 K/uL   Eosinophils Relative 0 %   Eosinophils Absolute 0.0 0.0 - 0.7 K/uL   Basophils Relative 0 %   Basophils Absolute 0.0 0.0 - 0.1 K/uL  Lipase, blood     Status: Abnormal   Collection Time: 11/25/15 12:37 PM  Result Value Ref Range   Lipase 550 (H) 11 - 51 U/L    Comment: RESULTS CONFIRMED BY MANUAL DILUTION  Triglycerides     Status: None   Collection Time: 11/25/15 12:37 PM  Result Value Ref Range   Triglycerides 72 <150 mg/dL    Comment: Performed at [facility]  I-Stat Beta hCG blood, ED ([facility], [facility], [facility] only)     Status: None   Collection Time: 11/25/15 12:46 PM  Result Value Ref Range   I-stat hCG, quantitative <5.0 <5 mIU/mL   Comment 3            Comment:   GEST. AGE      CONC.  (mIU/mL)   <=1 WEEK        5 - 50     2 WEEKS       50 - 500     3 WEEKS       100 - 10,000     4 WEEKS     1,000 - 30,000        FEMALE AND NON-PREGNANT FEMALE:     LESS THAN 5 mIU/mL   Ethanol     Status: None   Collection Time: 11/25/15  2:52 PM  Result Value Ref Range   Alcohol, Ethyl (B) <5 <5 mg/dL    Comment:        LOWEST DETECTABLE LIMIT FOR SERUM ALCOHOL IS 5 mg/dL FOR MEDICAL PURPOSES ONLY   Comprehensive metabolic panel     Status: Abnormal   Collection Time: 11/26/15  4:55 AM  Result Value Ref Range   Sodium 138 135 - 145  mmol/L   Potassium 3.8 3.5 - 5.1 mmol/L   Chloride 107 101 -  111 mmol/L   CO2 23 22 - 32 mmol/L   Glucose, Bld 106 (H) 65 - 99 mg/dL   BUN 10 6 - 20 mg/dL   Creatinine, Ser 0.63 0.44 - 1.00 mg/dL   Calcium 9.0 8.9 - 10.3 mg/dL   Total Protein 7.3 6.5 - 8.1 g/dL   Albumin 3.8 3.5 - 5.0 g/dL   AST 20 15 - 41 U/L   ALT 14 14 - 54 U/L   Alkaline Phosphatase 46 38 - 126 U/L   Total Bilirubin 0.6 0.3 - 1.2 mg/dL   GFR calc non Af Amer >60 >60 mL/min   GFR calc Af Amer >60 >60 mL/min    Comment: (NOTE) The eGFR has been calculated using the CKD EPI equation. This calculation has not been validated in all clinical situations. eGFR's persistently <60 mL/min signify possible Chronic Kidney Disease.    Anion gap 8 5 - 15  CBC     Status: Abnormal   Collection Time: 11/26/15  4:55 AM  Result Value Ref Range   WBC 10.6 (H) 4.0 - 10.5 K/uL   RBC 3.79 (L) 3.87 - 5.11 MIL/uL   Hemoglobin 10.2 (L) 12.0 - 15.0 g/dL   HCT 29.7 (L) 36.0 - 46.0 %   MCV 78.4 78.0 - 100.0 fL   MCH 26.9 26.0 - 34.0 pg   MCHC 34.3 30.0 - 36.0 g/dL   RDW 17.8 (H) 11.5 - 15.5 %   Platelets 176 150 - 400 K/uL  Protime-INR     Status: None   Collection Time: 11/26/15  4:55 AM  Result Value Ref Range   Prothrombin Time 13.1 11.4 - 15.2 seconds   INR 0.99   Lipase, blood     Status: Abnormal   Collection Time: 11/26/15  4:55 AM  Result Value Ref Range   Lipase 260 (H) 11 - 51 U/L  Glucose, capillary     Status: Abnormal   Collection Time: 11/26/15  7:44 AM  Result Value Ref Range   Glucose-Capillary 102 (H) 65 - 99 mg/dL   US Abdomen Limited Ruq  Result Date: 11/25/2015 CLINICAL DATA:  Three days of abdominal pain. Possible acute pancreatitis. EXAM: US ABDOMEN LIMITED - RIGHT UPPER QUADRANT COMPARISON:  1.9 mm FINDINGS: Gallbladder: The gallbladder is adequately distended. There are echogenic mobile shadowing stones measuring up to 5 mm in diameter. There is no gallbladder wall thickening, pericholecystic fluid,  or positive sonographic Murphy's sign. Common bile duct: Diameter: 1.9 mm Liver: The hepatic echotexture is normal. There is no focal mass nor ductal dilation. IMPRESSION: Gallstones without sonographic evidence of acute cholecystitis. Normal appearance of the liver and common bile duct. Electronically Signed   By: David  Martinique M.D.   On: 11/25/2015 15:22   Assessment/Plan Acute pancreatitis - gallstone vs alcoholic   21/2/24 RUQ U/S multiple, non-obstructing gallstones; no GB wall thickening, pericholecystic fluid. Negative murphy's  WBC 10.6  Lipase 260 from 550 U/L  LFT's are WNL - repeat in AM  Alcohol abuse- CIWA  FEN: NPO, IVF ID: none VTE: lovenox, SCD's   Dispo - recommend bowel rest, pain control, and IV fluids. Suspect acute pancreatitis secondary to EtOH use, not gallstones. No s/s of acute cholecystitis/choledocholithiasis on RUQ U/S and LFTs are WNL. Majority of patients pain is epigastric and in her back - less over her RUQ/right flank. HIDA scan would definitively r/o acute cholecystitis.   Recommend inpatient treatment for acute pancreatitis with outpatient surgical follow-up for elective laparoscopic cholecystectomy. Will confirm  with MD. Should her gallbladder be removed this admission, suspect she would not be medically ready for surgery until Sunday/Monday based on significant abdominal tenderness and elevated lipase.    Jill Alexanders, [facility] Surgery 11/26/2015, 10:52 AM Pager: [phone] Consults: [phone] Mon-Fri 7:00 am-4:30 pm Sat-Sun 7:00 am-11:30 am

## 2015-11-27 DIAGNOSIS — K852 Alcohol induced acute pancreatitis without necrosis or infection: Secondary | ICD-10-CM

## 2015-11-27 LAB — CBC
HEMATOCRIT: 28.4 % — AB (ref 36.0–46.0)
Hemoglobin: 9.5 g/dL — ABNORMAL LOW (ref 12.0–15.0)
MCH: 26.4 pg (ref 26.0–34.0)
MCHC: 33.5 g/dL (ref 30.0–36.0)
MCV: 78.9 fL (ref 78.0–100.0)
PLATELETS: 180 10*3/uL (ref 150–400)
RBC: 3.6 MIL/uL — AB (ref 3.87–5.11)
RDW: 17.3 % — ABNORMAL HIGH (ref 11.5–15.5)
WBC: 8.6 10*3/uL (ref 4.0–10.5)

## 2015-11-27 LAB — COMPREHENSIVE METABOLIC PANEL
ALT: 12 U/L — ABNORMAL LOW (ref 14–54)
ANION GAP: 7 (ref 5–15)
AST: 15 U/L (ref 15–41)
Albumin: 3.3 g/dL — ABNORMAL LOW (ref 3.5–5.0)
Alkaline Phosphatase: 39 U/L (ref 38–126)
BUN: 10 mg/dL (ref 6–20)
CALCIUM: 8.6 mg/dL — AB (ref 8.9–10.3)
CHLORIDE: 107 mmol/L (ref 101–111)
CO2: 24 mmol/L (ref 22–32)
Creatinine, Ser: 0.57 mg/dL (ref 0.44–1.00)
GFR calc non Af Amer: >60 mL/min (ref >60)
Glucose, Bld: 78 mg/dL (ref 65–99)
Potassium: 3.3 mmol/L — ABNORMAL LOW (ref 3.5–5.1)
SODIUM: 138 mmol/L (ref 135–145)
Total Bilirubin: 0.5 mg/dL (ref 0.3–1.2)
Total Protein: 6.8 g/dL (ref 6.5–8.1)

## 2015-11-27 LAB — LIPASE, BLOOD: LIPASE: 99 U/L — AB (ref 11–51)

## 2015-11-27 MED ORDER — POTASSIUM CHLORIDE CRYS ER 20 MEQ PO TBCR
40.0000 meq | EXTENDED_RELEASE_TABLET | Freq: Once | ORAL | Status: AC
  Administered 2015-11-27: 40 meq via ORAL
  Filled 2015-11-27: qty 2

## 2015-11-27 NOTE — Progress Notes (Signed)
PROGRESS NOTE                                                                                                                                                                                                             Patient Demographics:    Hannah Hardy, is a 25 y.o. female, DOB - 10-17-1990, JXB:[id number]  Admit date - 11/25/2015   Admitting Physician Rhetta Mura, MD  Outpatient Primary MD for the patient is No PCP Per Patient  LOS - 2    Chief Complaint  Patient presents with  . Abdominal Pain       Brief Narrative   25 year old female with history of cholelithiasis, obesity, alcohol abuse, who presents with complaints of abdominal pain, has elevated lipase level, admitted for acute pancreatitis.   Subjective:    Karyl Kinnier today has, No headache, No chest pain, No shortness of breath or cough,Still complaints of abdominal pain, nausea, but no vomiting  Assessment  & Plan :    Active Problems:   Pancreatitis   Cholelithiasis   Alcohol abuse   Acute pancreatitis - This is most likely alcoholic pancreatitis, given her significant alcohol use. - Abdominal ultrasound significant for cholelithiasis, but gallstones unlikely cause for her acute pancreatitis given bile duct within normal limits, LFTs within normal limit, this is most likely incidental finding, surgery recommend Lap Chole as outpatient - Lipase trending down, but she is still complaining of significant abdominal pain, nausea, and had vomiting this a.m., so we'll keep nothing by mouth and continue with IV fluids.  Cholelithiasis - Please see above discussion  Alcohol abuse - Continuously IWA protocol   Code Status : Full  Family Communication  : None  Disposition Plan  : Home when stable  Consults  :  General surgery  Procedures  : None  DVT Prophylaxis  :  Lovenox -  SCDs Lab Results  Component Value Date   PLT 180 11/27/2015     Antibiotics  :  Anti-infectives    None        Objective:   Vitals:   11/26/15 0503 11/26/15 1710 11/26/15 2106 11/27/15 0458  BP: 122/71 110/68 137/71 133/76  Pulse: 72 61 (!) 54 70  Resp: 18 20 18 18   Temp: 98 F (36.7 C) 98 F (36.7 C) 98.5 F (36.9 C) 98.5 F (36.9  C)  TempSrc: Oral Oral Oral Oral  SpO2: 100% 99% 99% 98%  Weight:      Height:        Wt Readings from Last 3 Encounters:  11/25/15 88.9 kg (196 lb)  09/09/15 88.9 kg (196 lb)  12/30/12 99.4 kg (219 lb 3.2 oz)     Intake/Output Summary (Last 24 hours) at 11/27/15 1259 Last data filed at 11/27/15 0900  Gross per 24 hour  Intake                0 ml  Output                0 ml  Net                0 ml     Physical Exam  Awake Alert, Oriented X 3, No new F.N deficits, Normal affect [place].AT,PERRAL Supple Neck,No JVD, No cervical lymphadenopathy appriciated.  Symmetrical Chest wall movement, Good air movement bilaterally, CTAB RRR,No Gallops,Rubs or new Murmurs, No Parasternal Heave +ve B.Sounds, Abd Soft, significant tenderness in mid abdomen, No rebound - guarding or rigidity. No Cyanosis, Clubbing or edema, No new Rash or bruise      Data Review:    CBC  Recent Labs Lab 11/25/15 1237 11/26/15 0455 11/27/15 0509  WBC 9.7 10.6* 8.6  HGB 11.9* 10.2* 9.5*  HCT 34.5* 29.7* 28.4*  PLT 225 176 180  MCV 77.0* 78.4 78.9  MCH 26.6 26.9 26.4  MCHC 34.5 34.3 33.5  RDW 17.9* 17.8* 17.3*  LYMPHSABS 0.8  --   --   MONOABS 0.8  --   --   EOSABS 0.0  --   --   BASOSABS 0.0  --   --     Chemistries   Recent Labs Lab 11/25/15 1237 11/26/15 0455 11/27/15 0509  NA 134* 138 138  K 3.6 3.8 3.3*  CL 101 107 107  CO2 21* 23 24  GLUCOSE 147* 106* 78  BUN 9 10 10   CREATININE 0.68 0.63 0.57  CALCIUM 9.5 9.0 8.6*  AST 29 20 15   ALT 17 14 12*  ALKPHOS 58 46 39  BILITOT 0.9 0.6 0.5    ------------------------------------------------------------------------------------------------------------------  Recent Labs  11/25/15 1237  TRIG 72    No results found for: HGBA1C ------------------------------------------------------------------------------------------------------------------ No results for input(s): TSH, T4TOTAL, T3FREE, THYROIDAB in the last 72 hours.  Invalid input(s): FREET3 ------------------------------------------------------------------------------------------------------------------ No results for input(s): VITAMINB12, FOLATE, FERRITIN, TIBC, IRON, RETICCTPCT in the last 72 hours.  Coagulation profile  Recent Labs Lab 11/26/15 0455  INR 0.99    No results for input(s): DDIMER in the last 72 hours.  Cardiac Enzymes No results for input(s): CKMB, TROPONINI, MYOGLOBIN in the last 168 hours.  Invalid input(s): CK ------------------------------------------------------------------------------------------------------------------ No results found for: BNP  Inpatient Medications  Scheduled Meds: . enoxaparin (LOVENOX) injection  40 mg Subcutaneous Q24H  . feeding supplement  1 Container Oral TID BM  . folic acid  1 mg Oral Daily  . [START ON 11/28/2015] LORazepam  0-4 mg Oral Q12H  . multivitamin with minerals  1 tablet Oral Daily  . potassium chloride  40 mEq Oral Once  . thiamine  100 mg Oral Daily   Or  . thiamine  100 mg Intravenous Daily   Continuous Infusions: . sodium chloride 125 mL/hr at 11/27/15 0949   PRN Meds:.ketorolac, LORazepam **OR** LORazepam, LORazepam **FOLLOWED BY** [START ON 11/28/2015] LORazepam, ondansetron **OR** ondansetron (ZOFRAN) IV, oxyCODONE, promethazine  Micro Results No  results found for this or any previous visit (from the past 240 hour(s)).  Radiology Reports US Abdomen Limited Ruq  Result Date: 11/25/2015 CLINICAL DATA:  Three days of abdominal pain. Possible acute pancreatitis. EXAM: US ABDOMEN  LIMITED - RIGHT UPPER QUADRANT COMPARISON:  1.9 mm FINDINGS: Gallbladder: The gallbladder is adequately distended. There are echogenic mobile shadowing stones measuring up to 5 mm in diameter. There is no gallbladder wall thickening, pericholecystic fluid, or positive sonographic Murphy's sign. Common bile duct: Diameter: 1.9 mm Liver: The hepatic echotexture is normal. There is no focal mass nor ductal dilation. IMPRESSION: Gallstones without sonographic evidence of acute cholecystitis. Normal appearance of the liver and common bile duct. Electronically Signed   By: David  [place] M.D.   On: 11/25/2015 15:22     Ivanell Deshotel M.D on 11/27/2015 at 12:59 PM  Between 7am to 7pm - Pager - [phone]  After 7pm go to [email or web address] - password [facility]  [facility] -  Office  [phone]

## 2015-11-27 NOTE — Progress Notes (Signed)
  Subjective: Complains of abdominal pain  Objective: Vital signs in last 24 hours: Temp:  [98 F (36.7 C)-98.5 F (36.9 C)] 98.5 F (36.9 C) (11/11 0458) Pulse Rate:  [54-70] 70 (11/11 0458) Resp:  [18-20] 18 (11/11 0458) BP: (110-137)/(68-76) 133/76 (11/11 0458) SpO2:  [98 %-99 %] 98 % (11/11 0458) Last BM Date: 11/26/15  Intake/Output from previous day: 11/10 0701 - 11/11 0700 In: -  Out: 1 [Emesis/NG output:1] Intake/Output this shift: No intake/output data recorded.  Resp: clear to auscultation bilaterally Cardio: regular rate and rhythm GI: diffusely tender  Lab Results:   Recent Labs  11/26/15 0455 11/27/15 0509  WBC 10.6* 8.6  HGB 10.2* 9.5*  HCT 29.7* 28.4*  PLT 176 180   BMET  Recent Labs  11/26/15 0455 11/27/15 0509  NA 138 138  K 3.8 3.3*  CL 107 107  CO2 23 24  GLUCOSE 106* 78  BUN 10 10  CREATININE 0.63 0.57  CALCIUM 9.0 8.6*   PT/INR  Recent Labs  11/26/15 0455  LABPROT 13.1  INR 0.99   ABG No results for input(s): PHART, HCO3 in the last 72 hours.  Invalid input(s): PCO2, PO2  Studies/Results: [place] Abdomen Limited Ruq  Result Date: 11/25/2015 CLINICAL DATA:  Three days of abdominal pain. Possible acute pancreatitis. EXAM: US ABDOMEN LIMITED - RIGHT UPPER QUADRANT COMPARISON:  1.9 mm FINDINGS: Gallbladder: The gallbladder is adequately distended. There are echogenic mobile shadowing stones measuring up to 5 mm in diameter. There is no gallbladder wall thickening, pericholecystic fluid, or positive sonographic Murphy's sign. Common bile duct: Diameter: 1.9 mm Liver: The hepatic echotexture is normal. There is no focal mass nor ductal dilation. IMPRESSION: Gallstones without sonographic evidence of acute cholecystitis. Normal appearance of the liver and common bile duct. Electronically Signed   By: David  [place] M.D.   On: 11/25/2015 15:22    Anti-infectives: Anti-infectives    None      Assessment/Plan: s/p * No surgery found  * Continue bowel rest until pain improves and lipase normalizes  Unclear if etiology is gallstones or alcohol She will need gallbladder removed, the only question is the timing. Will follow  LOS: 2 days    TOTH III,PAUL S 11/27/2015

## 2015-11-28 LAB — COMPREHENSIVE METABOLIC PANEL
ALBUMIN: 3.1 g/dL — AB (ref 3.5–5.0)
ALK PHOS: 38 U/L (ref 38–126)
ALT: 10 U/L — AB (ref 14–54)
ANION GAP: 8 (ref 5–15)
AST: 15 U/L (ref 15–41)
BILIRUBIN TOTAL: 0.6 mg/dL (ref 0.3–1.2)
BUN: 6 mg/dL (ref 6–20)
CALCIUM: 8.5 mg/dL — AB (ref 8.9–10.3)
CO2: 20 mmol/L — AB (ref 22–32)
CREATININE: 0.51 mg/dL (ref 0.44–1.00)
Chloride: 106 mmol/L (ref 101–111)
GFR calc Af Amer: >60 mL/min (ref >60)
GFR calc non Af Amer: >60 mL/min (ref >60)
GLUCOSE: 66 mg/dL (ref 65–99)
Potassium: 4.1 mmol/L (ref 3.5–5.1)
SODIUM: 134 mmol/L — AB (ref 135–145)
TOTAL PROTEIN: 6.7 g/dL (ref 6.5–8.1)

## 2015-11-28 LAB — LIPASE, BLOOD: Lipase: 58 U/L — ABNORMAL HIGH (ref 11–51)

## 2015-11-28 MED ORDER — NICOTINE 21 MG/24HR TD PT24
21.0000 mg | MEDICATED_PATCH | Freq: Every day | TRANSDERMAL | Status: DC
  Administered 2015-11-28 – 2015-11-29 (×2): 21 mg via TRANSDERMAL
  Filled 2015-11-28 (×2): qty 1

## 2015-11-28 NOTE — Progress Notes (Signed)
PROGRESS NOTE                                                                                                                                                                                                             Patient Demographics:    Hannah Hardy, is a 25 y.o. female, DOB - 1990-03-15, [id number]  Admit date - 11/25/2015   Admitting Physician Rhetta Mura, MD  Outpatient Primary MD for the patient is No PCP Per Patient  LOS - 3    Chief Complaint  Patient presents with  . Abdominal Pain       Brief Narrative   25 year old female with history of cholelithiasis, obesity, alcohol abuse, who presents with complaints of abdominal pain, has elevated lipase level, admitted for acute pancreatitis.   Subjective:    [name] today has, No headache, No chest pain, No shortness of breath or cough,Reports abdominal pain has significantly improved, reports some nausea, but no vomiting .  Assessment  & Plan :    Active Problems:   Pancreatitis   Cholelithiasis   Alcohol abuse   Acute pancreatitis - This is most likely alcoholic pancreatitis, given her significant alcohol use. - Abdominal ultrasound significant for cholelithiasis, but gallstones unlikely cause for her acute pancreatitis given bile duct within normal limits, LFTs within normal limit, surgery recommend Lap Chole , Timing to be determined - Lipase trending down, abdominal pain significantly improved, no further vomiting, will start clear liquid diet, and advance as tolerated   Cholelithiasis - Please see above discussion  Alcohol abuse - Continue with CIWA protocol   Code Status : Full  Family Communication  : None  Disposition Plan  : Home when stable  Consults  :  General surgery  Procedures  : None  DVT Prophylaxis  :  Lovenox -  SCDs Lab Results  Component Value Date   PLT 180 11/27/2015    Antibiotics  :  Anti-infectives    None        Objective:   Vitals:   11/27/15 1441 11/27/15 2105 11/28/15 0017 11/28/15 0559  BP: 137/86 134/81 127/80 125/75  Pulse: 61 62 62 60  Resp: 20 20 20 18   Temp: 98.2 F (36.8 C) 98.5 F (36.9 C) 98.5 F (36.9 C) 98.3 F (36.8 C)  TempSrc: Oral Oral Oral Oral  SpO2:  98% 99% 97% 99%  Weight:  88.1 kg (194 lb 3.6 oz)    Height:        Wt Readings from Last 3 Encounters:  11/27/15 88.1 kg (194 lb 3.6 oz)  09/09/15 88.9 kg (196 lb)  12/30/12 99.4 kg (219 lb 3.2 oz)     Intake/Output Summary (Last 24 hours) at 11/28/15 1151 Last data filed at 11/27/15 1700  Gross per 24 hour  Intake                0 ml  Output                2 ml  Net               -2 ml     Physical Exam  Awake Alert, Oriented X 3, No new F.N deficits, Normal affect [place].AT,PERRAL Supple Neck,No JVD, No cervical lymphadenopathy appriciated.  Symmetrical Chest wall movement, Good air movement bilaterally, CTAB RRR,No Gallops,Rubs or new Murmurs, No Parasternal Heave +ve B.Sounds, Abd Soft, Abdominal tenderness significantly subsided, No rebound - guarding or rigidity. No Cyanosis, Clubbing or edema, No new Rash or bruise      Data Review:    CBC  Recent Labs Lab 11/25/15 1237 11/26/15 0455 11/27/15 0509  WBC 9.7 10.6* 8.6  HGB 11.9* 10.2* 9.5*  HCT 34.5* 29.7* 28.4*  PLT 225 176 180  MCV 77.0* 78.4 78.9  MCH 26.6 26.9 26.4  MCHC 34.5 34.3 33.5  RDW 17.9* 17.8* 17.3*  LYMPHSABS 0.8  --   --   MONOABS 0.8  --   --   EOSABS 0.0  --   --   BASOSABS 0.0  --   --     Chemistries   Recent Labs Lab 11/25/15 1237 11/26/15 0455 11/27/15 0509 11/28/15 0544  NA 134* 138 138 134*  K 3.6 3.8 3.3* 4.1  CL 101 107 107 106  CO2 21* 23 24 20*  GLUCOSE 147* 106* 78 66  BUN 9 10 10 6   CREATININE 0.68 0.63 0.57 0.51  CALCIUM 9.5 9.0 8.6* 8.5*  AST 29 20 15 15   ALT 17 14 12* 10*  ALKPHOS 58 46 39 38  BILITOT 0.9 0.6 0.5 0.6    ------------------------------------------------------------------------------------------------------------------  Recent Labs  11/25/15 1237  TRIG 72    No results found for: HGBA1C ------------------------------------------------------------------------------------------------------------------ No results for input(s): TSH, T4TOTAL, T3FREE, THYROIDAB in the last 72 hours.  Invalid input(s): FREET3 ------------------------------------------------------------------------------------------------------------------ No results for input(s): VITAMINB12, FOLATE, FERRITIN, TIBC, IRON, RETICCTPCT in the last 72 hours.  Coagulation profile  Recent Labs Lab 11/26/15 0455  INR 0.99    No results for input(s): DDIMER in the last 72 hours.  Cardiac Enzymes No results for input(s): CKMB, TROPONINI, MYOGLOBIN in the last 168 hours.  Invalid input(s): CK ------------------------------------------------------------------------------------------------------------------ No results found for: BNP  Inpatient Medications  Scheduled Meds: . enoxaparin (LOVENOX) injection  40 mg Subcutaneous Q24H  . feeding supplement  1 Container Oral TID BM  . folic acid  1 mg Oral Daily  . LORazepam  0-4 mg Oral Q12H  . multivitamin with minerals  1 tablet Oral Daily  . nicotine  21 mg Transdermal Daily  . thiamine  100 mg Oral Daily   Or  . thiamine  100 mg Intravenous Daily   Continuous Infusions: . sodium chloride 150 mL/hr at 11/28/15 0936   PRN Meds:.ketorolac, LORazepam **OR** LORazepam, ondansetron **OR** ondansetron (ZOFRAN) IV, oxyCODONE, promethazine  Micro Results No results  found for this or any previous visit (from the past 240 hour(s)).  Radiology Reports US Abdomen Limited Ruq  Result Date: 11/25/2015 CLINICAL DATA:  Three days of abdominal pain. Possible acute pancreatitis. EXAM: US ABDOMEN LIMITED - RIGHT UPPER QUADRANT COMPARISON:  1.9 mm FINDINGS: Gallbladder: The  gallbladder is adequately distended. There are echogenic mobile shadowing stones measuring up to 5 mm in diameter. There is no gallbladder wall thickening, pericholecystic fluid, or positive sonographic Murphy's sign. Common bile duct: Diameter: 1.9 mm Liver: The hepatic echotexture is normal. There is no focal mass nor ductal dilation. IMPRESSION: Gallstones without sonographic evidence of acute cholecystitis. Normal appearance of the liver and common bile duct. Electronically Signed   By: David  [place] M.D.   On: 11/25/2015 15:22     Jef Futch M.D on 11/28/2015 at 11:51 AM  Between 7am to 7pm - Pager - [phone]  After 7pm go to [email or web address] - password [email or web address]  [facility] -  Office  [phone]

## 2015-11-28 NOTE — Progress Notes (Signed)
  Subjective: Resting comfortably  Objective: Vital signs in last 24 hours: Temp:  [98.2 F (36.8 C)-98.5 F (36.9 C)] 98.3 F (36.8 C) (11/12 0559) Pulse Rate:  [60-62] 60 (11/12 0559) Resp:  [18-20] 18 (11/12 0559) BP: (125-137)/(75-86) 125/75 (11/12 0559) SpO2:  [97 %-99 %] 99 % (11/12 0559) Weight:  [88.1 kg (194 lb 3.6 oz)] 88.1 kg (194 lb 3.6 oz) (11/11 2105) Last BM Date: 11/26/15  Intake/Output from previous day: 11/11 0701 - 11/12 0700 In: 0  Out: 3 [Urine:3] Intake/Output this shift: No intake/output data recorded.  Resp: clear to auscultation bilaterally Cardio: regular rate and rhythm GI: soft, moderate diffuse tenderness  Lab Results:   Recent Labs  11/26/15 0455 11/27/15 0509  WBC 10.6* 8.6  HGB 10.2* 9.5*  HCT 29.7* 28.4*  PLT 176 180   BMET  Recent Labs  11/27/15 0509 11/28/15 0544  NA 138 134*  K 3.3* 4.1  CL 107 106  CO2 24 20*  GLUCOSE 78 66  BUN 10 6  CREATININE 0.57 0.51  CALCIUM 8.6* 8.5*   PT/INR  Recent Labs  11/26/15 0455  LABPROT 13.1  INR 0.99   ABG No results for input(s): PHART, HCO3 in the last 72 hours.  Invalid input(s): PCO2, PO2  Studies/Results: No results found.  Anti-infectives: Anti-infectives    None      Assessment/Plan: s/p * No surgery found * Likely alcoholic pancreatitis. advance diet once pain improves  May benefit from lap chole at some point. Will discuss timing with team Will follow  LOS: 3 days    TOTH III,Ataya Murdy S 11/28/2015

## 2015-11-29 LAB — GLUCOSE, CAPILLARY: Glucose-Capillary: 91 mg/dL (ref 65–99)

## 2015-11-29 MED ORDER — OXYCODONE HCL 5 MG PO TABS: 5.0000 mg | ORAL_TABLET | Freq: Four times a day (QID) | ORAL | 0 refills | Status: DC | PRN

## 2015-11-29 MED ORDER — ONDANSETRON HCL 4 MG PO TABS: 4.0000 mg | ORAL_TABLET | Freq: Four times a day (QID) | ORAL | 0 refills | Status: DC | PRN

## 2015-11-29 NOTE — Progress Notes (Signed)
Discharge instructions given to pt, verbalized understanding. Left the unit in stable condition. 

## 2015-11-29 NOTE — Progress Notes (Signed)
[facility] Progress Note     Subjective: Denies abdominal pain, nausea, vomiting. +flatus and bowel movements.  Low fat diet to manage gallbladder symptoms discussed with patient.  Objective: Vital signs in last 24 hours: Temp:  [98.2 F (36.8 C)-98.8 F (37.1 C)] 98.5 F (36.9 C) (11/13 0550) Pulse Rate:  [60-67] 64 (11/13 0550) Resp:  [18] 18 (11/13 0550) BP: (123-160)/(68-102) 123/68 (11/13 0550) SpO2:  [99 %-100 %] 99 % (11/13 0550) Last BM Date: 11/28/15  Intake/Output from previous day: 11/12 0701 - 11/13 0700 In: 720 [P.O.:720] Out: -  Intake/Output this shift: No intake/output data recorded.  PE: Gen:  Alert, NAD, cooperative Pulm:  CTA, no W/R/R Abd: Soft, obese, NT/ND, +BS, abdominal striae noted  Ext:  No erythema, edema, or tenderness   Lab Results:   Recent Labs  11/27/15 0509  WBC 8.6  HGB 9.5*  HCT 28.4*  PLT 180   BMET  Recent Labs  11/27/15 0509 11/28/15 0544  NA 138 134*  K 3.3* 4.1  CL 107 106  CO2 24 20*  GLUCOSE 78 66  BUN 10 6  CREATININE 0.57 0.51  CALCIUM 8.6* 8.5*   PT/INR No results for input(s): LABPROT, INR in the last 72 hours. CMP     Component Value Date/Time   NA 134 (L) 11/28/2015 0544   K 4.1 11/28/2015 0544   CL 106 11/28/2015 0544   CO2 20 (L) 11/28/2015 0544   GLUCOSE 66 11/28/2015 0544   BUN 6 11/28/2015 0544   CREATININE 0.51 11/28/2015 0544   CALCIUM 8.5 (L) 11/28/2015 0544   PROT 6.7 11/28/2015 0544   ALBUMIN 3.1 (L) 11/28/2015 0544   AST 15 11/28/2015 0544   ALT 10 (L) 11/28/2015 0544   ALKPHOS 38 11/28/2015 0544   BILITOT 0.6 11/28/2015 0544   GFRNONAA >60 11/28/2015 0544   GFRAA >60 11/28/2015 0544   Lipase     Component Value Date/Time   LIPASE 58 (H) 11/28/2015 0544   Assessment/Plan Acute pancreatitis - gallstone vs alcoholic              11/25/15 RUQ U/S multiple, non-obstructing gallstones; no GB wall thickening, pericholecystic fluid. Negative murphy's  Abdominal  tenderness improving             WBC 8.9             Lipase 58              LFT's remain WNL  Alcohol abuse- CIWA  FEN: full liquid diet ID: none VTE: lovenox, SCD's   Plan: No acute surgical interventions necessary this hospital admission - pancreatitis likely secondary to EtOH abuse as LFTs and U/S have not been suggestive of active gallbladder disease. Advance to low fat diet as tolerated  outpatient surgical follow-up with Dr. Chevis PrettyPaul Toth III  In 2-4 weeks to schedule elective laparoscopic cholecystectomy    LOS: 4 days    Adam PhenixElizabeth S Daryle Amis , [facility] 11/29/2015, 9:22 AM Pager: [phone] Consults: [phone] Mon-Fri 7:00 am-4:30 pm Sat-Sun 7:00 am-11:30 am

## 2015-11-29 NOTE — Discharge Summary (Signed)
Hannah Hardy, is a 25 y.o. female  DOB 10-29-1990  MRN [id number].  Admission date:  11/25/2015  Admitting Physician  Rhetta Mura, MD  Discharge Date:  11/29/2015   Primary MD  No PCP Per Patient  Recommendations for primary care physician for things to follow:  - Please check CBC, BMP during next visit - Should follow with surgery as an outpatient regarding elective laparoscopic cholecystectomy in 2-4 weeks  Admission Diagnosis  Pancreatitis [K85.90]   Discharge Diagnosis  Pancreatitis [K85.90]    Active Problems:   Pancreatitis   Cholelithiasis   Alcohol abuse      Past Medical History:  Diagnosis Date  . Asthma     History reviewed. No pertinent surgical history.     History of present illness and  Hospital Course:     Kindly see H&P for history of present illness and admission details, please review complete Labs, Consult reports and Test reports for all details in brief  HPI  from the history and physical done on the day of admission by Dr Mahala Menghini on  11/24/2005  25 year old female with bland medical history but chronic every other day drinker comes to the hospital 11/25/2015 with abdominal pain lasting for the past 2 days States that she drinks vodka shots and bear every other day and has heavy EtOH use States she also has had history of gallstone attacks about 4 years ago She is currently unemployed and does not work outside the home She went to and went through 11th grade  She lives in [place] for the past 4 years  Tells me that she's had nausea vomiting is not been able to keep anything down No dietary No ill contacts No dysuria No hematuria No diarrhea No blurred vision no double vision   Emergency room workup revealed lipase 550 LFTs within normal limits Hemoglobin 11.9 MCV 77   Hospital Course  25 year old female with history of  cholelithiasis, obesity, alcohol abuse, who presents with complaints of abdominal pain, has elevated lipase level, admitted for acute pancreatitis.   Acute pancreatitis - This is most likely alcoholic pancreatitis, given her significant alcohol use. - Abdominal ultrasound significant for cholelithiasis, but gallstones unlikely cause for her acute pancreatitis given bile duct within normal limits, LFTs within normal limit, surgery recommend Lap Chole as an outpatient in 2-4 weeks , Timing to be determined - Lipase is almost back to normal, no further abdominal pain, nausea or vomiting, tolerating low-fat diet today .   Cholelithiasis - Please see above discussion  Alcohol abuse - on  CIWA protocol, no evidence of withdrawals during hospital stay   Discharge Condition:  stable   Follow UP  Follow-up Information    Please use the list of medicaid [place] doctors provided to you in emergency room by case manager to assist you're your choice of doctor for follow up. Schedule an appointment as soon as possible for a visit.        TOTH Mat Carne, MD. Schedule an appointment as soon as  possible for a visit.   Specialty:  General Surgery Why:  as soon as possible for a visit in 2-4 weeks to discuss surgery for removal of your gallbladder.  Contact information: [address] [place] [address] [phone]             Discharge Instructions  and  Discharge Medications   Discharge Instructions    Discharge instructions    Complete by:  As directed    Follow with Primary MD in 7 days   Get CBC, CMP,  checked  by Primary MD next visit.    Activity: As tolerated with Full fall precautions use walker/cane & assistance as needed   Disposition Home    Diet: low fat  On your next visit with your primary care physician please Get Medicines reviewed and adjusted.   Please request your Prim.MD to go over all Hospital Tests and Procedure/Radiological  results at the follow up, please get all Hospital records sent to your Prim MD by signing hospital release before you go home.   If you experience worsening of your admission symptoms, develop shortness of breath, life threatening emergency, suicidal or homicidal thoughts you must seek medical attention immediately by calling 911 or calling your MD immediately  if symptoms less severe.  You Must read complete instructions/literature along with all the possible adverse reactions/side effects for all the Medicines you take and that have been prescribed to you. Take any new Medicines after you have completely understood and accpet all the possible adverse reactions/side effects.   Do not drive, operating heavy machinery, perform activities at heights, swimming or participation in water activities or provide baby sitting services if your were admitted for syncope or siezures until you have seen by Primary MD or a Neurologist and advised to do so again.  Do not drive when taking Pain medications.    Do not take more than prescribed Pain, Sleep and Anxiety Medications  Special Instructions: If you have smoked or chewed Tobacco  in the last 2 yrs please stop smoking, stop any regular Alcohol  and or any Recreational drug use.  Wear Seat belts while driving.   Please note  You were cared for by a hospitalist during your hospital stay. If you have any questions about your discharge medications or the care you received while you were in the hospital after you are discharged, you can call the unit and asked to speak with the hospitalist on call if the hospitalist that took care of you is not available. Once you are discharged, your primary care physician will handle any further medical issues. Please note that NO REFILLS for any discharge medications will be authorized once you are discharged, as it is imperative that you return to your primary care physician (or establish a relationship with a primary  care physician if you do not have one) for your aftercare needs so that they can reassess your need for medications and monitor your lab values.   Increase activity slowly    Complete by:  As directed        Medication List    STOP taking these medications   aspirin EC 81 MG tablet   predniSONE 50 MG tablet Commonly known as:  DELTASONE     TAKE these medications   ondansetron 4 MG tablet Commonly known as:  ZOFRAN Take 1 tablet (4 mg total) by mouth every 6 (six) hours as needed for nausea.   oxyCODONE 5 MG immediate release  tablet Commonly known as:  Oxy IR/ROXICODONE Take 1 tablet (5 mg total) by mouth every 6 (six) hours as needed for severe pain.         Diet and Activity recommendation: See Discharge Instructions above   Consults obtained -  Gen Surgery   Major procedures and Radiology Reports - PLEASE review detailed and final reports for all details, in brief -      [place] Abdomen Limited Ruq  Result Date: 11/25/2015 CLINICAL DATA:  Three days of abdominal pain. Possible acute pancreatitis. EXAM: US ABDOMEN LIMITED - RIGHT UPPER QUADRANT COMPARISON:  1.9 mm FINDINGS: Gallbladder: The gallbladder is adequately distended. There are echogenic mobile shadowing stones measuring up to 5 mm in diameter. There is no gallbladder wall thickening, pericholecystic fluid, or positive sonographic Murphy's sign. Common bile duct: Diameter: 1.9 mm Liver: The hepatic echotexture is normal. There is no focal mass nor ductal dilation. IMPRESSION: Gallstones without sonographic evidence of acute cholecystitis. Normal appearance of the liver and common bile duct. Electronically Signed   By: David  Swazil[place] M.D.   On: 11/25/2015 15:22    Micro Results     No results found for this or any previous visit (from the past 240 hour(s)).     Today   Subjective:   Hannah Hardy today has no headache,no chest or  abdominal pain, tolerating low-fat diet feels much better wants to go  home today.   Objective:   Blood pressure (!) 152/97, pulse 85, temperature 98.2 F (36.8 C), temperature source Oral, resp. rate 18, height 5\' 8"  (1.727 m), weight 88.1 kg (194 lb 3.6 oz), last menstrual period 11/21/2015, SpO2 98 %.   Intake/Output Summary (Last 24 hours) at 11/29/15 1423 Last data filed at 11/28/15 1700  Gross per 24 hour  Intake              240 ml  Output                0 ml  Net              240 ml    Exam Awake Alert, Oriented x 3, No new F.N deficits, Normal affect Impact.AT,PERRAL Supple Neck,No JVD, No cervical lymphadenopathy appriciated.  Symmetrical Chest wall movement, Good air movement bilaterally, CTAB RRR,No Gallops,Rubs or new Murmurs, No Parasternal Heave +ve B.Sounds, Abd Soft, Non tender, No organomegaly appriciated, No rebound -guarding or rigidity. No Cyanosis, Clubbing or edema, No new Rash or bruise  Data Review   CBC w Diff: Lab Results  Component Value Date   WBC 8.6 11/27/2015   HGB 9.5 (L) 11/27/2015   HCT 28.4 (L) 11/27/2015   PLT 180 11/27/2015   LYMPHOPCT 9 11/25/2015   MONOPCT 8 11/25/2015   EOSPCT 0 11/25/2015   BASOPCT 0 11/25/2015    CMP: Lab Results  Component Value Date   NA 134 (L) 11/28/2015   K 4.1 11/28/2015   CL 106 11/28/2015   CO2 20 (L) 11/28/2015   BUN 6 11/28/2015   CREATININE 0.51 11/28/2015   PROT 6.7 11/28/2015   ALBUMIN 3.1 (L) 11/28/2015   BILITOT 0.6 11/28/2015   ALKPHOS 38 11/28/2015   AST 15 11/28/2015   ALT 10 (L) 11/28/2015  .   Total Time in preparing paper work, data evaluation and todays exam - 35 minutes  ELGERGAWY, DAWOOD M.D on 11/29/2015 at 2:23 PM  [facility]   Office  [phone]

## 2016-02-08 ENCOUNTER — Emergency Department (HOSPITAL_COMMUNITY): Payer: Self-pay

## 2016-02-08 ENCOUNTER — Emergency Department (HOSPITAL_COMMUNITY)
Admission: EM | Admit: 2016-02-08 | Discharge: 2016-02-08 | Disposition: A | Discharge status: Home / Self Care | Payer: Self-pay | Attending: Emergency Medicine | Admitting: Emergency Medicine

## 2016-02-08 ENCOUNTER — Encounter (HOSPITAL_COMMUNITY): Payer: Self-pay | Admitting: Emergency Medicine

## 2016-02-08 DIAGNOSIS — K529 Noninfective gastroenteritis and colitis, unspecified: Secondary | ICD-10-CM | POA: Insufficient documentation

## 2016-02-08 DIAGNOSIS — F17210 Nicotine dependence, cigarettes, uncomplicated: Secondary | ICD-10-CM | POA: Insufficient documentation

## 2016-02-08 DIAGNOSIS — Z79899 Other long term (current) drug therapy: Secondary | ICD-10-CM | POA: Insufficient documentation

## 2016-02-08 DIAGNOSIS — J45909 Unspecified asthma, uncomplicated: Secondary | ICD-10-CM | POA: Insufficient documentation

## 2016-02-08 LAB — CBC
HEMATOCRIT: 30.6 % — AB (ref 36.0–46.0)
HEMOGLOBIN: 10.7 g/dL — AB (ref 12.0–15.0)
MCH: 25.1 pg — ABNORMAL LOW (ref 26.0–34.0)
MCHC: 35 g/dL (ref 30.0–36.0)
MCV: 71.7 fL — ABNORMAL LOW (ref 78.0–100.0)
Platelets: 290 10*3/uL (ref 150–400)
RBC: 4.27 MIL/uL (ref 3.87–5.11)
RDW: 17.7 % — ABNORMAL HIGH (ref 11.5–15.5)
WBC: 11.1 10*3/uL — ABNORMAL HIGH (ref 4.0–10.5)

## 2016-02-08 LAB — COMPREHENSIVE METABOLIC PANEL
ALBUMIN: 3.8 g/dL (ref 3.5–5.0)
ALK PHOS: 46 U/L (ref 38–126)
ALT: 11 U/L — ABNORMAL LOW (ref 14–54)
ANION GAP: 14 (ref 5–15)
AST: 18 U/L (ref 15–41)
BILIRUBIN TOTAL: 0.7 mg/dL (ref 0.3–1.2)
BUN: 7 mg/dL (ref 6–20)
CALCIUM: 9.4 mg/dL (ref 8.9–10.3)
CO2: 26 mmol/L (ref 22–32)
Chloride: 96 mmol/L — ABNORMAL LOW (ref 101–111)
Creatinine, Ser: 0.67 mg/dL (ref 0.44–1.00)
GFR calc non Af Amer: >60 mL/min (ref >60)
Glucose, Bld: 112 mg/dL — ABNORMAL HIGH (ref 65–99)
POTASSIUM: 3.1 mmol/L — AB (ref 3.5–5.1)
SODIUM: 136 mmol/L (ref 135–145)
TOTAL PROTEIN: 8.4 g/dL — AB (ref 6.5–8.1)

## 2016-02-08 LAB — URINALYSIS, ROUTINE W REFLEX MICROSCOPIC
GLUCOSE, UA: NEGATIVE mg/dL
Ketones, ur: 5 mg/dL — AB
LEUKOCYTES UA: NEGATIVE
Nitrite: NEGATIVE
PH: 5 (ref 5.0–8.0)
Protein, ur: >=300 mg/dL — AB
SPECIFIC GRAVITY, URINE: 1.035 — AB (ref 1.005–1.030)

## 2016-02-08 LAB — LIPASE, BLOOD: Lipase: 138 U/L — ABNORMAL HIGH (ref 11–51)

## 2016-02-08 LAB — I-STAT BETA HCG BLOOD, ED (MC, WL, AP ONLY): I-stat hCG, quantitative: <5 m[IU]/mL (ref <5)

## 2016-02-08 MED ORDER — IOPAMIDOL (ISOVUE-300) INJECTION 61%
100.0000 mL | Freq: Once | INTRAVENOUS | Status: AC | PRN
  Administered 2016-02-08: 100 mL via INTRAVENOUS

## 2016-02-08 MED ORDER — ONDANSETRON HCL 4 MG PO TABS: 4.0000 mg | ORAL_TABLET | Freq: Four times a day (QID) | ORAL | 0 refills | Status: DC

## 2016-02-08 MED ORDER — CIPROFLOXACIN HCL 500 MG PO TABS: 500.0000 mg | ORAL_TABLET | Freq: Two times a day (BID) | ORAL | 0 refills | Status: DC

## 2016-02-08 MED ORDER — METRONIDAZOLE 500 MG PO TABS
250.0000 mg | ORAL_TABLET | Freq: Once | ORAL | Status: AC
  Administered 2016-02-08: 250 mg via ORAL
  Filled 2016-02-08: qty 1

## 2016-02-08 MED ORDER — SODIUM CHLORIDE 0.9 % IV BOLUS (SEPSIS)
1000.0000 mL | Freq: Once | INTRAVENOUS | Status: AC
  Administered 2016-02-08: 1000 mL via INTRAVENOUS

## 2016-02-08 MED ORDER — METRONIDAZOLE 500 MG PO TABS: 500.0000 mg | ORAL_TABLET | Freq: Two times a day (BID) | ORAL | 0 refills | Status: DC

## 2016-02-08 MED ORDER — MORPHINE SULFATE (PF) 4 MG/ML IV SOLN
4.0000 mg | Freq: Once | INTRAVENOUS | Status: AC
  Administered 2016-02-08: 4 mg via INTRAVENOUS
  Filled 2016-02-08: qty 1

## 2016-02-08 MED ORDER — HYDROCODONE-ACETAMINOPHEN 5-325 MG PO TABS: 1.0000 | ORAL_TABLET | ORAL | 0 refills | Status: DC | PRN

## 2016-02-08 MED ORDER — CIPROFLOXACIN HCL 500 MG PO TABS
500.0000 mg | ORAL_TABLET | Freq: Once | ORAL | Status: AC
  Administered 2016-02-08: 500 mg via ORAL
  Filled 2016-02-08: qty 1

## 2016-02-08 MED ORDER — ONDANSETRON HCL 4 MG/2ML IJ SOLN
4.0000 mg | Freq: Once | INTRAMUSCULAR | Status: AC
  Administered 2016-02-08: 4 mg via INTRAVENOUS
  Filled 2016-02-08: qty 2

## 2016-02-08 MED ORDER — IOPAMIDOL (ISOVUE-300) INJECTION 61%
INTRAVENOUS | Status: AC
  Filled 2016-02-08: qty 100

## 2016-02-08 NOTE — ED Notes (Signed)
Discharge instructions, follow up care, and rx x4 reviewed with patient. Patient verbalized understanding. 

## 2016-02-08 NOTE — ED Provider Notes (Signed)
[facility]-EMERGENCY DEPT Provider Note   CSN: [id number] Arrival date & time: 02/08/16  0458   History   Chief Complaint Chief Complaint  Patient presents with  . Abdominal Pain  . Emesis  . Diarrhea    HPI Hannah Hardy is a 26 y.o. female.  HPI   Patient to the ER with PMH of cholelithiasis, ETOH abuse and pancreatitis. She comes to the ER with complaints of abdominal pain, nausea and vomiting for the past 7 days. She has pain all over her abdomen but describes it as worse around her umbilicus and down towards her RLQ. She denies having dysuria, vaginal discharge, vaginal bleeding. She denies taking anything at home for her symptoms.  Denies CP, back pain, flank pain, headache, fevers, le swelling, diaphoresis, confusion, syncope.  Past Medical History:  Diagnosis Date  . Asthma     Patient Active Problem List   Diagnosis Date Noted  . Cholelithiasis 11/26/2015  . Alcohol abuse 11/26/2015  . Pancreatitis 11/25/2015    History reviewed. No pertinent surgical history.  OB History    No data available     Home Medications    Prior to Admission medications   Medication Sig Start Date End Date Taking? Authorizing Provider  albuterol (PROVENTIL HFA;VENTOLIN HFA) 108 (90 Base) MCG/ACT inhaler Inhale 1-2 puffs into the lungs every 6 (six) hours as needed for wheezing or shortness of breath.   Yes Historical Provider, MD  ciprofloxacin (CIPRO) 500 MG tablet Take 1 tablet (500 mg total) by mouth 2 (two) times daily. 02/08/16   Marlon Pel, PA-C  HYDROcodone-acetaminophen (NORCO/VICODIN) 5-325 MG tablet Take 1-2 tablets by mouth every 4 (four) hours as needed. 02/08/16   [name], PA-C  metroNIDAZOLE (FLAGYL) 500 MG tablet Take 1 tablet (500 mg total) by mouth 2 (two) times daily. 02/08/16   Shahzaib Azevedo Neva Seat, PA-C  ondansetron (ZOFRAN) 4 MG tablet Take 1 tablet (4 mg total) by mouth every 6 (six) hours as needed for nausea. Patient not taking: Reported on 02/08/2016  11/29/15   Leana Roe Elgergawy, MD  ondansetron (ZOFRAN) 4 MG tablet Take 1 tablet (4 mg total) by mouth every 6 (six) hours. 02/08/16   Marlon Pel, PA-C  oxyCODONE (OXY IR/ROXICODONE) 5 MG immediate release tablet Take 1 tablet (5 mg total) by mouth every 6 (six) hours as needed for severe pain. Patient not taking: Reported on 02/08/2016 11/29/15   Starleen Arms, MD   Family History History reviewed. No pertinent family history.  Social History Social History  Substance Use Topics  . Smoking status: Current Every Day Smoker    Packs/day: 1.00    Types: Cigarettes  . Smokeless tobacco: Never Used  . Alcohol use Yes     Comment: occ    Allergies   Patient has no known allergies.  Review of Systems Review of Systems Review of Systems All other systems negative except as documented in the HPI. All pertinent positives and negatives as reviewed in the HPI.   Physical Exam Updated Vital Signs BP (!) 100/46 (BP Location: Right Arm)   Pulse 76   Temp 98.7 F (37.1 C) (Oral)   Resp 16   Ht 5\' 7"  (1.702 m)   Wt 93 kg   LMP 02/05/2016 (Exact Date) Comment: negative HCG blood test 02-08-2016  SpO2 99%   BMI 32.11 kg/m   Physical Exam  Constitutional: She appears well-developed and well-nourished.  HENT:  Head: Normocephalic and atraumatic.  Eyes: Conjunctivae are normal. Pupils are equal, round,  and reactive to light.  Neck: Trachea normal, normal range of motion and full passive range of motion without pain. Neck supple.  Cardiovascular: Normal rate, regular rhythm and normal pulses.   Pulmonary/Chest: Effort normal and breath sounds normal. Chest wall is not dull to percussion. She exhibits no tenderness, no crepitus, no edema, no deformity and no retraction.  Abdominal: Soft. Normal appearance and bowel sounds are normal. Tenderness: diffuse tenderness on exam.  Pain worse periumbilical.  Musculoskeletal: Normal range of motion.  Neurological: She is alert. She has  normal strength.  Skin: Skin is warm, dry and intact.  Psychiatric: She has a normal mood and affect. Her speech is normal and behavior is normal. Judgment and thought content normal. Cognition and memory are normal.    ED Treatments / Results  Labs (all labs ordered are listed, but only abnormal results are displayed) Labs Reviewed  LIPASE, BLOOD - Abnormal; Notable for the following:       Result Value   Lipase 138 (*)    All other components within normal limits  COMPREHENSIVE METABOLIC PANEL - Abnormal; Notable for the following:    Potassium 3.1 (*)    Chloride 96 (*)    Glucose, Bld 112 (*)    Total Protein 8.4 (*)    ALT 11 (*)    All other components within normal limits  CBC - Abnormal; Notable for the following:    WBC 11.1 (*)    Hemoglobin 10.7 (*)    HCT 30.6 (*)    MCV 71.7 (*)    MCH 25.1 (*)    RDW 17.7 (*)    All other components within normal limits  URINALYSIS, ROUTINE W REFLEX MICROSCOPIC - Abnormal; Notable for the following:    Color, Urine AMBER (*)    APPearance CLOUDY (*)    Specific Gravity, Urine 1.035 (*)    Hgb urine dipstick LARGE (*)    Bilirubin Urine MODERATE (*)    Ketones, ur 5 (*)    Protein, ur >=300 (*)    Bacteria, UA RARE (*)    Squamous Epithelial / LPF 6-30 (*)    All other components within normal limits  I-STAT BETA HCG BLOOD, ED ([facility], [facility], [facility] ONLY)    EKG  EKG Interpretation None       Radiology Ct Abdomen Pelvis W Contrast  Result Date: 02/08/2016 CLINICAL DATA:  Abdominal pain with nausea and vomiting EXAM: CT ABDOMEN AND PELVIS WITH CONTRAST TECHNIQUE: Multidetector CT imaging of the abdomen and pelvis was performed using the standard protocol following bolus administration of intravenous contrast. CONTRAST:  100mL ISOVUE-300 IOPAMIDOL (ISOVUE-300) INJECTION 61% COMPARISON:  July 16, 2011 FINDINGS: Lower chest: There is slight bibasilar atelectasis. Lung bases elsewhere clear. There is a small hiatal hernia.  Hepatobiliary: There is a slight degree of fatty infiltration near the fissure for the ligamentum teres. Beyond this slight degree of fatty infiltration, no focal liver lesions are evident. Gallbladder wall is not appreciably thickened. There is no biliary duct dilatation. Pancreas: No pancreatic mass or inflammatory focus. Spleen: No splenic lesions are evident. Adrenals/Urinary Tract: Adrenals appear normal bilaterally. Kidneys bilaterally show no mass or hydronephrosis on either side. There is no renal or ureteral calculus on either side. Urinary bladder is midline with wall thickness within normal limits. Stomach/Bowel: There is mesenteric stranding surrounding much of the ascending colon extending to the level of the hepatic flexure. There is no appreciable diverticular disease in this area. The wall of the colon  in this area does not appear appreciably thickened. Elsewhere, there is no appreciable bowel wall or mesenteric thickening. The colon is largely decompressed. There is no evident bowel obstruction. No free air or portal venous air. Vascular/Lymphatic: There is no abdominal aortic aneurysm. No vascular lesions are evident. There is no evident adenopathy in the abdomen or pelvis. Reproductive: Uterus is anteverted. There is a cystic area in the left ovary measuring 2.4 x 1.9 cm, a probable dominant physiologic follicle. There is no other pelvic mass. No pelvic fluid collection. Other: Appendix appears normal. No abscess or ascites evident in the abdomen or pelvis. There is a small ventral hernia containing only fat. Musculoskeletal: There are no blastic or lytic bone lesions. There is no intramuscular or abdominal wall lesion evident. IMPRESSION: Evidence of mesenteric thickening surrounding portions of the ascending colon and hepatic flexure, felt to represent colitis of uncertain etiology. No associated bowel wall thickening. No perforation or diverticular disease in this area. No abscess. No bowel  obstruction. Appendix appears normal. There is a small hiatal hernia. There is a small ventral hernia containing only fat. There is no renal or ureteral calculus.  No hydronephrosis. Probable dominant follicle left ovary. Electronically Signed   By: Bretta Bang III M.D.   On: 02/08/2016 08:55    Procedures Procedures (including critical care time)  Medications Ordered in ED Medications  iopamidol (ISOVUE-300) 61 % injection (not administered)  ondansetron (ZOFRAN) injection 4 mg (4 mg Intravenous Given 02/08/16 0657)  sodium chloride 0.9 % bolus 1,000 mL (0 mLs Intravenous Stopped 02/08/16 0746)  morphine 4 MG/ML injection 4 mg (4 mg Intravenous Given 02/08/16 0749)  iopamidol (ISOVUE-300) 61 % injection 100 mL (100 mLs Intravenous Contrast Given 02/08/16 0830)  ciprofloxacin (CIPRO) tablet 500 mg (500 mg Oral Given 02/08/16 0947)  metroNIDAZOLE (FLAGYL) tablet 250 mg (250 mg Oral Given 02/08/16 0946)     Initial Impression / Assessment and Plan / ED Course  I have reviewed the triage vital signs and the nursing notes.  Pertinent labs & imaging results that were available during my care of the patient were reviewed by me and considered in my medical decision making (see chart for details).  Patient with symptoms consistent with viral gastroenteritis.  Vitals are stable, no fever.  No signs of dehydration, tolerating PO fluids > 6 oz.  Lungs are clear.  No focal abdominal pain, no concern for appendicitis, cholecystitis, pancreatitis, ruptured viscus, UTI, kidney stone, or any other abdominal etiology. CT shows colitis, tolerated Cipro and Flagyl without difficulty- pt says she is feeling much better. Tolerated fluid challenge with no difficulty. Supportive therapy indicated with return if symptoms worsen.  Patient counseled.   Final Clinical Impressions(s) / ED Diagnoses   Final diagnoses:  Colitis    New Prescriptions New Prescriptions   CIPROFLOXACIN (CIPRO) 500 MG TABLET    Take  1 tablet (500 mg total) by mouth 2 (two) times daily.   HYDROCODONE-ACETAMINOPHEN (NORCO/VICODIN) 5-325 MG TABLET    Take 1-2 tablets by mouth every 4 (four) hours as needed.   METRONIDAZOLE (FLAGYL) 500 MG TABLET    Take 1 tablet (500 mg total) by mouth 2 (two) times daily.   ONDANSETRON (ZOFRAN) 4 MG TABLET    Take 1 tablet (4 mg total) by mouth every 6 (six) hours.     Marlon Pel, PA-C 02/08/16 1015    Tomasita Crumble, MD 02/08/16 1436

## 2016-02-08 NOTE — ED Triage Notes (Signed)
Pt brought in by EMS for c/o abd pain, nausea, vomiting, and diarrhea for 6 days  Pt is c/o lower abd pain with tenderness noted upon palpation  Pt states she has some streaks of blood in her sputum

## 2016-02-08 NOTE — ED Notes (Signed)
Patient given water and ginger ale for PO challenge.

## 2016-02-08 NOTE — ED Notes (Signed)
Patient notified for need of urine sample

## 2016-02-12 ENCOUNTER — Observation Stay (HOSPITAL_COMMUNITY)
Admission: EM | Admit: 2016-02-12 | Discharge: 2016-02-13 | Disposition: A | Discharge status: Home / Self Care | Payer: Self-pay | Attending: Internal Medicine | Admitting: Internal Medicine

## 2016-02-12 ENCOUNTER — Encounter (HOSPITAL_COMMUNITY): Payer: Self-pay

## 2016-02-12 ENCOUNTER — Emergency Department (HOSPITAL_COMMUNITY): Payer: Self-pay

## 2016-02-12 DIAGNOSIS — K449 Diaphragmatic hernia without obstruction or gangrene: Secondary | ICD-10-CM | POA: Insufficient documentation

## 2016-02-12 DIAGNOSIS — K439 Ventral hernia without obstruction or gangrene: Secondary | ICD-10-CM | POA: Insufficient documentation

## 2016-02-12 DIAGNOSIS — F1010 Alcohol abuse, uncomplicated: Secondary | ICD-10-CM | POA: Insufficient documentation

## 2016-02-12 DIAGNOSIS — F1210 Cannabis abuse, uncomplicated: Secondary | ICD-10-CM | POA: Insufficient documentation

## 2016-02-12 DIAGNOSIS — N83202 Unspecified ovarian cyst, left side: Secondary | ICD-10-CM | POA: Insufficient documentation

## 2016-02-12 DIAGNOSIS — D72829 Elevated white blood cell count, unspecified: Secondary | ICD-10-CM | POA: Diagnosis present

## 2016-02-12 DIAGNOSIS — K529 Noninfective gastroenteritis and colitis, unspecified: Principal | ICD-10-CM | POA: Insufficient documentation

## 2016-02-12 DIAGNOSIS — K859 Acute pancreatitis without necrosis or infection, unspecified: Secondary | ICD-10-CM | POA: Insufficient documentation

## 2016-02-12 DIAGNOSIS — E876 Hypokalemia: Secondary | ICD-10-CM | POA: Insufficient documentation

## 2016-02-12 DIAGNOSIS — E669 Obesity, unspecified: Secondary | ICD-10-CM | POA: Insufficient documentation

## 2016-02-12 DIAGNOSIS — R748 Abnormal levels of other serum enzymes: Secondary | ICD-10-CM | POA: Insufficient documentation

## 2016-02-12 DIAGNOSIS — Z6832 Body mass index (BMI) 32.0-32.9, adult: Secondary | ICD-10-CM | POA: Insufficient documentation

## 2016-02-12 DIAGNOSIS — E6609 Other obesity due to excess calories: Secondary | ICD-10-CM

## 2016-02-12 DIAGNOSIS — F17210 Nicotine dependence, cigarettes, uncomplicated: Secondary | ICD-10-CM | POA: Insufficient documentation

## 2016-02-12 DIAGNOSIS — E860 Dehydration: Secondary | ICD-10-CM | POA: Diagnosis present

## 2016-02-12 DIAGNOSIS — J45909 Unspecified asthma, uncomplicated: Secondary | ICD-10-CM | POA: Insufficient documentation

## 2016-02-12 DIAGNOSIS — J4520 Mild intermittent asthma, uncomplicated: Secondary | ICD-10-CM

## 2016-02-12 LAB — URINALYSIS, ROUTINE W REFLEX MICROSCOPIC
Bacteria, UA: NONE SEEN
Bilirubin Urine: NEGATIVE
GLUCOSE, UA: NEGATIVE mg/dL
KETONES UR: 20 mg/dL — AB
Leukocytes, UA: NEGATIVE
Nitrite: NEGATIVE
PH: 6 (ref 5.0–8.0)
Protein, ur: 30 mg/dL — AB
Specific Gravity, Urine: >1.046 — ABNORMAL HIGH (ref 1.005–1.030)

## 2016-02-12 LAB — CBC
HCT: 32.2 % — ABNORMAL LOW (ref 36.0–46.0)
Hemoglobin: 11.1 g/dL — ABNORMAL LOW (ref 12.0–15.0)
MCH: 24.4 pg — AB (ref 26.0–34.0)
MCHC: 34.5 g/dL (ref 30.0–36.0)
MCV: 70.8 fL — AB (ref 78.0–100.0)
PLATELETS: 508 10*3/uL — AB (ref 150–400)
RBC: 4.55 MIL/uL (ref 3.87–5.11)
RDW: 17.5 % — ABNORMAL HIGH (ref 11.5–15.5)
WBC: 11.5 10*3/uL — ABNORMAL HIGH (ref 4.0–10.5)

## 2016-02-12 LAB — MAGNESIUM: Magnesium: 2.1 mg/dL (ref 1.7–2.4)

## 2016-02-12 LAB — PHOSPHORUS: Phosphorus: 3.3 mg/dL (ref 2.5–4.6)

## 2016-02-12 LAB — COMPREHENSIVE METABOLIC PANEL
ALT: 13 U/L — ABNORMAL LOW (ref 14–54)
ANION GAP: 17 — AB (ref 5–15)
AST: 20 U/L (ref 15–41)
Albumin: 4.2 g/dL (ref 3.5–5.0)
Alkaline Phosphatase: 51 U/L (ref 38–126)
BUN: 22 mg/dL — ABNORMAL HIGH (ref 6–20)
CHLORIDE: 90 mmol/L — AB (ref 101–111)
CO2: 26 mmol/L (ref 22–32)
CREATININE: 1.03 mg/dL — AB (ref 0.44–1.00)
Calcium: 9.7 mg/dL (ref 8.9–10.3)
GFR calc non Af Amer: >60 mL/min (ref >60)
Glucose, Bld: 113 mg/dL — ABNORMAL HIGH (ref 65–99)
Potassium: 2.7 mmol/L — CL (ref 3.5–5.1)
SODIUM: 133 mmol/L — AB (ref 135–145)
Total Bilirubin: 1.1 mg/dL (ref 0.3–1.2)
Total Protein: 9.1 g/dL — ABNORMAL HIGH (ref 6.5–8.1)

## 2016-02-12 LAB — LIPASE, BLOOD: LIPASE: 190 U/L — AB (ref 11–51)

## 2016-02-12 LAB — TSH: TSH: 1.77 u[IU]/mL (ref 0.350–4.500)

## 2016-02-12 LAB — AMYLASE: Amylase: 278 U/L — ABNORMAL HIGH (ref 28–100)

## 2016-02-12 LAB — I-STAT BETA HCG BLOOD, ED (MC, WL, AP ONLY): I-stat hCG, quantitative: <5 m[IU]/mL (ref <5)

## 2016-02-12 LAB — ETHANOL

## 2016-02-12 MED ORDER — ALBUTEROL SULFATE (2.5 MG/3ML) 0.083% IN NEBU: 3.0000 mL | INHALATION_SOLUTION | Freq: Four times a day (QID) | RESPIRATORY_TRACT | Status: DC | PRN

## 2016-02-12 MED ORDER — POTASSIUM CHLORIDE CRYS ER 20 MEQ PO TBCR
40.0000 meq | EXTENDED_RELEASE_TABLET | Freq: Once | ORAL | Status: AC
  Administered 2016-02-12: 40 meq via ORAL
  Filled 2016-02-12: qty 2

## 2016-02-12 MED ORDER — SODIUM CHLORIDE 0.9% FLUSH
3.0000 mL | Freq: Two times a day (BID) | INTRAVENOUS | Status: DC
  Administered 2016-02-12: 3 mL via INTRAVENOUS

## 2016-02-12 MED ORDER — TRAMADOL HCL 50 MG PO TABS: 50.0000 mg | ORAL_TABLET | Freq: Four times a day (QID) | ORAL | Status: DC | PRN

## 2016-02-12 MED ORDER — ONDANSETRON HCL 4 MG/2ML IJ SOLN
4.0000 mg | Freq: Once | INTRAMUSCULAR | Status: AC
  Administered 2016-02-12: 4 mg via INTRAVENOUS
  Filled 2016-02-12: qty 2

## 2016-02-12 MED ORDER — VITAMIN B-1 100 MG PO TABS
100.0000 mg | ORAL_TABLET | Freq: Every day | ORAL | Status: DC
  Administered 2016-02-12 – 2016-02-13 (×2): 100 mg via ORAL
  Filled 2016-02-12 (×2): qty 1

## 2016-02-12 MED ORDER — ACETAMINOPHEN 650 MG RE SUPP: 650.0000 mg | Freq: Four times a day (QID) | RECTAL | Status: DC | PRN

## 2016-02-12 MED ORDER — ACETAMINOPHEN 325 MG PO TABS: 650.0000 mg | ORAL_TABLET | Freq: Four times a day (QID) | ORAL | Status: DC | PRN

## 2016-02-12 MED ORDER — ONDANSETRON HCL 4 MG/2ML IJ SOLN
4.0000 mg | Freq: Four times a day (QID) | INTRAMUSCULAR | Status: DC | PRN
  Administered 2016-02-12 – 2016-02-13 (×2): 4 mg via INTRAVENOUS
  Filled 2016-02-12 (×2): qty 2

## 2016-02-12 MED ORDER — FOLIC ACID 1 MG PO TABS
1.0000 mg | ORAL_TABLET | Freq: Every day | ORAL | Status: DC
  Administered 2016-02-12 – 2016-02-13 (×2): 1 mg via ORAL
  Filled 2016-02-12 (×2): qty 1

## 2016-02-12 MED ORDER — SODIUM CHLORIDE 0.9 % IV BOLUS (SEPSIS)
1000.0000 mL | Freq: Once | INTRAVENOUS | Status: AC
  Administered 2016-02-12: 1000 mL via INTRAVENOUS

## 2016-02-12 MED ORDER — POLYETHYLENE GLYCOL 3350 17 G PO PACK: 17.0000 g | PACK | Freq: Every day | ORAL | Status: DC | PRN

## 2016-02-12 MED ORDER — POTASSIUM CHLORIDE IN NACL 20-0.9 MEQ/L-% IV SOLN
INTRAVENOUS | Status: DC
  Administered 2016-02-12 – 2016-02-13 (×2): via INTRAVENOUS
  Filled 2016-02-12 (×2): qty 1000

## 2016-02-12 MED ORDER — HYDROMORPHONE HCL 1 MG/ML IJ SOLN
1.0000 mg | Freq: Once | INTRAMUSCULAR | Status: AC
  Administered 2016-02-12: 1 mg via INTRAVENOUS
  Filled 2016-02-12: qty 1

## 2016-02-12 MED ORDER — KETOROLAC TROMETHAMINE 30 MG/ML IJ SOLN
30.0000 mg | Freq: Three times a day (TID) | INTRAMUSCULAR | Status: DC | PRN
  Administered 2016-02-12 – 2016-02-13 (×2): 30 mg via INTRAVENOUS
  Filled 2016-02-12 (×2): qty 1

## 2016-02-12 MED ORDER — IOPAMIDOL (ISOVUE-300) INJECTION 61%
100.0000 mL | Freq: Once | INTRAVENOUS | Status: AC | PRN
  Administered 2016-02-12: 100 mL via INTRAVENOUS

## 2016-02-12 MED ORDER — ONDANSETRON HCL 4 MG PO TABS: 4.0000 mg | ORAL_TABLET | Freq: Four times a day (QID) | ORAL | Status: DC | PRN

## 2016-02-12 MED ORDER — IOPAMIDOL (ISOVUE-300) INJECTION 61%
INTRAVENOUS | Status: AC
  Filled 2016-02-12: qty 100

## 2016-02-12 MED ORDER — HEPARIN SODIUM (PORCINE) 5000 UNIT/ML IJ SOLN
5000.0000 [IU] | Freq: Three times a day (TID) | INTRAMUSCULAR | Status: DC
  Filled 2016-02-12: qty 1

## 2016-02-12 MED ORDER — SODIUM CHLORIDE 0.9 % IV SOLN
30.0000 meq | Freq: Once | INTRAVENOUS | Status: AC
  Administered 2016-02-12: 30 meq via INTRAVENOUS
  Filled 2016-02-12: qty 15

## 2016-02-12 NOTE — H&P (Signed)
History and Physical    Druann Stolzman ZOX:[id number] DOB: 1990/05/10 DOA: 02/12/2016  Referring Provider: Dr. Estell Harpin PCP: No PCP Per Patient   Patient coming from: home  Chief Complaint: nausea, vomiting, abd pain  HPI: Hannah Hardy is a 26 y.o. female with PMH significant for obesity, asthma, alcohol abuse and tobacco abuse; who presented to ED complaining of nausea, vomiting and abd pain. Pain mid/low abdomen, non radiated, crampy in nature, with associated nausea and vomiting; no relief factors and associated also with anorexia. Symptoms present for over 10 days now. Patient with significant difficulties keeping things down and expressing lightheadedness when changing positions. Of note patient denies, fever, HA's, CP, SOB, hematemesis, hematuria, mel;ena, hematochezia or any other complaints.   ED Course: CT scan was repeated, which showed improvement in prior intestinal inflammation; no other acute abnormalities. Patient found to have elevated lipase, potassium of 2.7 and dehydrated. IVF's and potassium given while in ED. Patient also received antiemetics and analgesics. TRH called to place in obervation due to difficulties keeping things down, abnormal electrolytes and eleavated lipase (with concerns for possible pancreatitis)  Review of Systems:  All other systems reviewed and apart from HPI, are negative.  Past Medical History:  Diagnosis Date  . Asthma     History reviewed. No pertinent surgical history.   reports that she has been smoking Cigarettes.  She has been smoking about 1.00 pack per day. She has never used smokeless tobacco. She reports that she drinks alcohol (but has not drink alcohol over 2 months now). She reports that she uses drugs, including Marijuana.  No Known Allergies  Family History reviewed with patient. Patient reported no pertinent family history.   Prior to Admission medications   Medication Sig Start Date End Date Taking? Authorizing  Provider  albuterol (PROVENTIL HFA;VENTOLIN HFA) 108 (90 Base) MCG/ACT inhaler Inhale 1-2 puffs into the lungs every 6 (six) hours as needed for wheezing or shortness of breath.   Yes Historical Provider, MD  ibuprofen (ADVIL,MOTRIN) 800 MG tablet Take 800 mg by mouth every 8 (eight) hours as needed for fever or moderate pain.   Yes Historical Provider, MD  ciprofloxacin (CIPRO) 500 MG tablet Take 1 tablet (500 mg total) by mouth 2 (two) times daily. 02/08/16   Marlon Pel, PA-C  HYDROcodone-acetaminophen (NORCO/VICODIN) 5-325 MG tablet Take 1-2 tablets by mouth every 4 (four) hours as needed. 02/08/16   Tiffany Neva Seat, PA-C  metroNIDAZOLE (FLAGYL) 500 MG tablet Take 1 tablet (500 mg total) by mouth 2 (two) times daily. 02/08/16   Tiffany Neva Seat, PA-C  ondansetron (ZOFRAN) 4 MG tablet Take 1 tablet (4 mg total) by mouth every 6 (six) hours. 02/08/16   Marlon Pel, PA-C    Physical Exam: Vitals:   02/12/16 1621 02/12/16 1700 02/12/16 1732 02/12/16 1840  BP: 125/76 134/90 134/81 122/75  Pulse: 61 62 60 62  Resp: 19 14 16 16   Temp:   97.8 F (36.6 C) 98.5 F (36.9 C)  TempSrc:   Oral Oral  SpO2: 99% 99% 100% 98%  Weight:      Height:       Constitutional: in not major distress, reports feeling hungry and after zofran given in Ed feeling better. No CP, no SOB, no fever. Eyes: PERTLA, lids and conjunctivae normal, no icterus ENMT: Mucous membranes dry on exam. Posterior pharynx clear of any exudate or lesions. No thrush. Neck: normal, supple, no masses, no thyromegaly, no JVD Respiratory: clear to auscultation bilaterally, no wheezing, no crackles. Normal  respiratory effort. No accessory muscle use.  Cardiovascular: S1 & S2 heard, regular rate and rhythm, no murmurs / rubs / gallops. No extremity edema. 2+ pedal pulses. No carotid bruits.  Abdomen:Obese, No distension, mild diffuse tenderness, no masses palpated. No hepatosplenomegaly. Bowel sounds normal.  Musculoskeletal: no clubbing /  cyanosis. No joint deformity upper and lower extremities. Good ROM, no contractures. Normal muscle tone.  Skin: no rashes, lesions, ulcers. No induration. Couple tattoos in her arms and neck appreciated Neurologic: CN 2-12 grossly intact. Sensation intact, DTR normal. Strength 4/5 in all 4 limbs due to poor effort.  Psychiatric: Normal judgment and insight. Alert and oriented x 3. Normal mood.   Labs on Admission: I have personally reviewed following labs and imaging studies  CBC:  Recent Labs Lab 02/08/16 0620 02/12/16 1404  WBC 11.1* 11.5*  HGB 10.7* 11.1*  HCT 30.6* 32.2*  MCV 71.7* 70.8*  PLT 290 508*   Basic Metabolic Panel:  Recent Labs Lab 02/08/16 0620 02/12/16 1404  NA 136 133*  K 3.1* 2.7*  CL 96* 90*  CO2 26 26  GLUCOSE 112* 113*  BUN 7 22*  CREATININE 0.67 1.03*  CALCIUM 9.4 9.7   GFR: Estimated Creatinine Clearance: 97 mL/min (by C-G formula based on SCr of 1.03 mg/dL (H)).   Liver Function Tests:  Recent Labs Lab 02/08/16 0620 02/12/16 1404  AST 18 20  ALT 11* 13*  ALKPHOS 46 51  BILITOT 0.7 1.1  PROT 8.4* 9.1*  ALBUMIN 3.8 4.2    Recent Labs Lab 02/08/16 0620 02/12/16 1404  LIPASE 138* 190*   Urine analysis:    Component Value Date/Time   COLORURINE AMBER (A) 02/08/2016 0751   APPEARANCEUR CLOUDY (A) 02/08/2016 0751   LABSPEC 1.035 (H) 02/08/2016 0751   PHURINE 5.0 02/08/2016 0751   GLUCOSEU NEGATIVE 02/08/2016 0751   HGBUR LARGE (A) 02/08/2016 0751   BILIRUBINUR MODERATE (A) 02/08/2016 0751   KETONESUR 5 (A) 02/08/2016 0751   PROTEINUR >=300 (A) 02/08/2016 0751   UROBILINOGEN 0.2 07/16/2011 1552   NITRITE NEGATIVE 02/08/2016 0751   LEUKOCYTESUR NEGATIVE 02/08/2016 0751   Radiological Exams on Admission: Ct Abdomen Pelvis W Contrast  Result Date: 02/12/2016 CLINICAL DATA:  Generalized abdominal pain and nausea vomiting for 10 days. EXAM: CT ABDOMEN AND PELVIS WITH CONTRAST TECHNIQUE: Multidetector CT imaging of the abdomen and  pelvis was performed using the standard protocol following bolus administration of intravenous contrast. CONTRAST:  ISOVUE-300 IOPAMIDOL (ISOVUE-300) INJECTION 61% COMPARISON:  02/08/2016 FINDINGS: Lower chest: No acute abnormality. Hepatobiliary: No focal liver abnormality is seen. No gallstones, gallbladder wall thickening, or biliary dilatation. Pancreas: Unremarkable. No pancreatic ductal dilatation or surrounding inflammatory changes. Spleen: Normal in size without focal abnormality. Adrenals/Urinary Tract: Adrenal glands are unremarkable. Kidneys are normal, without renal calculi, focal lesion, or hydronephrosis. Bladder is unremarkable. Stomach/Bowel: Stomach is within normal limits. Appendix appears normal. Mild persistent mesenteric stranding surrounding the ascending colon, improved. Vascular/Lymphatic: No significant vascular findings are present. No enlarged abdominal or pelvic lymph nodes. Reproductive: Uterus and bilateral adnexa are unremarkable. 2.5 cm left adnexal simple appearing cyst, exophytic to the left ovary. Other: No abdominal wall hernia or abnormality. No abdominopelvic ascites. Musculoskeletal: No acute or significant osseous findings. IMPRESSION: Improved mesenteric stranding surrounding the ascending colon. Normal appendix. Left adnexal 2.5 cm cyst, exophytic to the left ovary. This may represent an exophytic left ovarian cyst, however focal dilation of the fallopian tube cannot be excluded. Further evaluation with pelvic ultrasound may be considered.  Electronically Signed   By: Ted Mcalpine M.D.   On: 02/12/2016 16:32    EKG:  None  Assessment/Plan 1-acute enteritis: appears to be secondary to viral infection. Symptoms present for 10 days now and according to patient somewhat improving. She never took cipro and flagyl prescribed on 1/23. Repeat CT is demonstarting improvement in her intestinal inflammation. -will hold on abx's for now, as she is already improving  and there is no other acute abnormalities  -will provide aggressive IVF's and electrolytes repletion -will provide supportive care, PRN analgesics and antiemetics  -will follow clinical response and advance diet slowly (starting with Full liquid)  2-elevated lipase: -no pain in epigastric area on palpation and no abnormalities around her pancreas on CT -?? Acute phase reactivity  -none the less, following bowel rest, antiemetics, analgesics and fluid resuscitation along with supportive care as mentioned above -will check Amylase and repeat Lipase in am  3-hx of Alcohol abuse: patient expressed not drinking for over 2 months now -recent ETOH level < 5 -abstinence encourage -will use thiamine and folic acid -patient w/o hx of ever experiencing DT's or withdrawal  4-Hypokalemia -will monitor on telemetry  -will provide repletion as needed -will check Mg and Phosphorus level  5-Dehydration -due to poor PO intake and GI loses -will provide IVF's resuscitation and supportive care  6-Leukocytosis -no fever present -most likely associated with stress demargination, other possibility is to be associated with enteritis -will follow trend -will hold on antibiotics for now  7-Obesity -Body mass index is 32.11 kg/m. -low calorie diet and exercise discussed with patient   8-Asthma -stable  -patient no wheezing and denying SOB -will continue PRN albuterol   9-tobacco abuse and use of Marijuana -cessation counseling provided -nicotine patch ordered and declined.  Time: 50 minutes   DVT prophylaxis: heparin   Code Status: Full Family Communication: no family at bedside  Disposition Plan: home as soon as patient is able to keep things down; hopefully by 1/28 Consults called: none  Admission status: telemetry bed, observation, LOS < 2 midnights     Vassie Loll MD [facility] Pager [phone]  If 7PM-7AM, please contact night-coverage [email or web address] Password  [facility]  02/12/2016, 6:41 PM

## 2016-02-12 NOTE — ED Notes (Signed)
Pt transported to CT. All infusions paused.

## 2016-02-12 NOTE — ED Notes (Signed)
Infusions restarted.

## 2016-02-12 NOTE — ED Triage Notes (Signed)
Per EMS, Pt, from home, c/o generalized abdominal pain and n/v/d x 10 days.  Pain score 10/10.  Pt was seen x 4 days ago for same.  Pt was given multiple prescriptions, but did not get them filled.

## 2016-02-12 NOTE — ED Provider Notes (Signed)
[facility]-EMERGENCY DEPT Provider Note   CSN: [id number] Arrival date & time: 02/12/16  1333     History   Chief Complaint Chief Complaint  Patient presents with  . Abdominal Pain  . Emesis  . Diarrhea    HPI Hannah Hardy is a 26 y.o. female.  Patient complains of vomiting diarrhea and abdominal pain for 10 days. She has a history of pancreatitis. Patient states she threw up 12 times yesterday   The history is provided by the patient. No language interpreter was used.  Abdominal Pain   This is a recurrent problem. The current episode started more than 2 days ago. The problem occurs hourly. The problem has not changed since onset.The pain is associated with an unknown factor. The pain is located in the periumbilical region and epigastric region. The pain is at a severity of 4/10. The pain is moderate. Associated symptoms include diarrhea and vomiting. Pertinent negatives include frequency, hematuria and headaches.  Emesis   Associated symptoms include abdominal pain and diarrhea. Pertinent negatives include no cough and no headaches.  Diarrhea   Associated symptoms include abdominal pain and vomiting. Pertinent negatives include no headaches and no cough.    Past Medical History:  Diagnosis Date  . Asthma     Patient Active Problem List   Diagnosis Date Noted  . Hypokalemia 02/12/2016  . Cholelithiasis 11/26/2015  . Alcohol abuse 11/26/2015  . Pancreatitis 11/25/2015    History reviewed. No pertinent surgical history.  OB History    No data available       Home Medications    Prior to Admission medications   Medication Sig Start Date End Date Taking? Authorizing Provider  albuterol (PROVENTIL HFA;VENTOLIN HFA) 108 (90 Base) MCG/ACT inhaler Inhale 1-2 puffs into the lungs every 6 (six) hours as needed for wheezing or shortness of breath.   Yes Historical Provider, MD  ibuprofen (ADVIL,MOTRIN) 800 MG tablet Take 800 mg by mouth every 8 (eight) hours as needed for  fever or moderate pain.   Yes Historical Provider, MD  ciprofloxacin (CIPRO) 500 MG tablet Take 1 tablet (500 mg total) by mouth 2 (two) times daily. 02/08/16   Marlon Pel, PA-C  HYDROcodone-acetaminophen (NORCO/VICODIN) 5-325 MG tablet Take 1-2 tablets by mouth every 4 (four) hours as needed. 02/08/16   Tiffany Neva Seat, PA-C  metroNIDAZOLE (FLAGYL) 500 MG tablet Take 1 tablet (500 mg total) by mouth 2 (two) times daily. 02/08/16   Tiffany Neva Seat, PA-C  ondansetron (ZOFRAN) 4 MG tablet Take 1 tablet (4 mg total) by mouth every 6 (six) hours as needed for nausea. Patient not taking: Reported on 02/08/2016 11/29/15   Leana Roe Elgergawy, MD  ondansetron (ZOFRAN) 4 MG tablet Take 1 tablet (4 mg total) by mouth every 6 (six) hours. 02/08/16   Marlon Pel, PA-C  oxyCODONE (OXY IR/ROXICODONE) 5 MG immediate release tablet Take 1 tablet (5 mg total) by mouth every 6 (six) hours as needed for severe pain. Patient not taking: Reported on 02/08/2016 11/29/15   Starleen Arms, MD    Family History History reviewed. No pertinent family history.  Social History Social History  Substance Use Topics  . Smoking status: Current Every Day Smoker    Packs/day: 1.00    Types: Cigarettes  . Smokeless tobacco: Never Used  . Alcohol use Yes     Comment: occ     Allergies   Patient has no known allergies.   Review of Systems Review of Systems  Constitutional: Negative for appetite  change and fatigue.  HENT: Negative for congestion, ear discharge and sinus pressure.   Eyes: Negative for discharge.  Respiratory: Negative for cough.   Cardiovascular: Negative for chest pain.  Gastrointestinal: Positive for abdominal pain, diarrhea and vomiting.  Genitourinary: Negative for frequency and hematuria.  Musculoskeletal: Negative for back pain.  Skin: Negative for rash.  Neurological: Negative for seizures and headaches.  Psychiatric/Behavioral: Negative for hallucinations.     Physical Exam Updated  Vital Signs BP 125/76 (BP Location: Left Arm)   Pulse 61   Temp 98.8 F (37.1 C) (Oral)   Resp 19   Ht 5\' 7"  (1.702 m)   Wt 205 lb (93 kg)   LMP 02/05/2016 (Exact Date) Comment: negative HCG blood test 02-08-2016  SpO2 99%   BMI 32.11 kg/m   Physical Exam  Constitutional: She is oriented to person, place, and time. She appears well-developed.  HENT:  Head: Normocephalic.  Eyes: Conjunctivae and EOM are normal. No scleral icterus.  Neck: Neck supple. No thyromegaly present.  Cardiovascular: Normal rate and regular rhythm.  Exam reveals no gallop and no friction rub.   No murmur heard. Pulmonary/Chest: No stridor. She has no wheezes. She has no rales. She exhibits no tenderness.  Abdominal: She exhibits no distension. There is tenderness. There is no rebound.  Mild tenderness throughout  Musculoskeletal: Normal range of motion. She exhibits no edema.  Lymphadenopathy:    She has no cervical adenopathy.  Neurological: She is oriented to person, place, and time. She exhibits normal muscle tone. Coordination normal.  Skin: No rash noted. No erythema.  Psychiatric: She has a normal mood and affect. Her behavior is normal.     ED Treatments / Results  Labs (all labs ordered are listed, but only abnormal results are displayed) Labs Reviewed  LIPASE, BLOOD - Abnormal; Notable for the following:       Result Value   Lipase 190 (*)    All other components within normal limits  COMPREHENSIVE METABOLIC PANEL - Abnormal; Notable for the following:    Sodium 133 (*)    Potassium 2.7 (*)    Chloride 90 (*)    Glucose, Bld 113 (*)    BUN 22 (*)    Creatinine, Ser 1.03 (*)    Total Protein 9.1 (*)    ALT 13 (*)    Anion gap 17 (*)    All other components within normal limits  CBC - Abnormal; Notable for the following:    WBC 11.5 (*)    Hemoglobin 11.1 (*)    HCT 32.2 (*)    MCV 70.8 (*)    MCH 24.4 (*)    RDW 17.5 (*)    Platelets 508 (*)    All other components within  normal limits  ETHANOL  URINALYSIS, ROUTINE W REFLEX MICROSCOPIC  I-STAT BETA HCG BLOOD, ED ([facility], [facility], [facility] ONLY)    EKG  EKG Interpretation None       Radiology Ct Abdomen Pelvis W Contrast  Result Date: 02/12/2016 CLINICAL DATA:  Generalized abdominal pain and nausea vomiting for 10 days. EXAM: CT ABDOMEN AND PELVIS WITH CONTRAST TECHNIQUE: Multidetector CT imaging of the abdomen and pelvis was performed using the standard protocol following bolus administration of intravenous contrast. CONTRAST:  ISOVUE-300 IOPAMIDOL (ISOVUE-300) INJECTION 61% COMPARISON:  02/08/2016 FINDINGS: Lower chest: No acute abnormality. Hepatobiliary: No focal liver abnormality is seen. No gallstones, gallbladder wall thickening, or biliary dilatation. Pancreas: Unremarkable. No pancreatic ductal dilatation or surrounding inflammatory changes.  Spleen: Normal in size without focal abnormality. Adrenals/Urinary Tract: Adrenal glands are unremarkable. Kidneys are normal, without renal calculi, focal lesion, or hydronephrosis. Bladder is unremarkable. Stomach/Bowel: Stomach is within normal limits. Appendix appears normal. Mild persistent mesenteric stranding surrounding the ascending colon, improved. Vascular/Lymphatic: No significant vascular findings are present. No enlarged abdominal or pelvic lymph nodes. Reproductive: Uterus and bilateral adnexa are unremarkable. 2.5 cm left adnexal simple appearing cyst, exophytic to the left ovary. Other: No abdominal wall hernia or abnormality. No abdominopelvic ascites. Musculoskeletal: No acute or significant osseous findings. IMPRESSION: Improved mesenteric stranding surrounding the ascending colon. Normal appendix. Left adnexal 2.5 cm cyst, exophytic to the left ovary. This may represent an exophytic left ovarian cyst, however focal dilation of the fallopian tube cannot be excluded. Further evaluation with pelvic ultrasound may be considered. Electronically Signed   By: Ted Mcalpineobrinka   Dimitrova M.D.   On: 02/12/2016 16:32    Procedures Procedures (including critical care time)  Medications Ordered in ED Medications  potassium chloride 30 mEq in sodium chloride 0.9 % 265 mL (KCL MULTIRUN) IVPB (30 mEq Intravenous Given 02/12/16 1548)  iopamidol (ISOVUE-300) 61 % injection (not administered)  sodium chloride 0.9 % bolus 1,000 mL (1,000 mLs Intravenous Restarted 02/12/16 1623)  HYDROmorphone (DILAUDID) injection 1 mg (1 mg Intravenous Given 02/12/16 1533)  ondansetron (ZOFRAN) injection 4 mg (4 mg Intravenous Given 02/12/16 1533)  iopamidol (ISOVUE-300) 61 % injection 100 mL (100 mLs Intravenous Contrast Given 02/12/16 1613)     Initial Impression / Assessment and Plan / ED Course  I have reviewed the triage vital signs and the nursing notes.  Pertinent labs & imaging results that were available during my care of the patient were reviewed by me and considered in my medical decision making (see chart for details).   patient with persistent vomiting.   And hypokalemia. Patient will be admitted    Final Clinical Impressions(s) / ED Diagnoses   Final diagnoses:  Hypokalemia    New Prescriptions New Prescriptions   No medications on file     Bethann BerkshireJoseph Everett Ehrler, MD 02/12/16 1711

## 2016-02-12 NOTE — ED Notes (Signed)
100 ml/hr running with potassium infusion.

## 2016-02-13 DIAGNOSIS — K859 Acute pancreatitis without necrosis or infection, unspecified: Secondary | ICD-10-CM

## 2016-02-13 LAB — BASIC METABOLIC PANEL
Anion gap: 8 (ref 5–15)
BUN: 14 mg/dL (ref 6–20)
CHLORIDE: 97 mmol/L — AB (ref 101–111)
CO2: 26 mmol/L (ref 22–32)
Calcium: 8.5 mg/dL — ABNORMAL LOW (ref 8.9–10.3)
Creatinine, Ser: 0.85 mg/dL (ref 0.44–1.00)
GFR calc Af Amer: >60 mL/min (ref >60)
GFR calc non Af Amer: >60 mL/min (ref >60)
GLUCOSE: 111 mg/dL — AB (ref 65–99)
POTASSIUM: 3.4 mmol/L — AB (ref 3.5–5.1)
Sodium: 131 mmol/L — ABNORMAL LOW (ref 135–145)

## 2016-02-13 LAB — LIPASE, BLOOD: Lipase: 290 U/L — ABNORMAL HIGH (ref 11–51)

## 2016-02-13 LAB — CBC
HEMATOCRIT: 27 % — AB (ref 36.0–46.0)
Hemoglobin: 9 g/dL — ABNORMAL LOW (ref 12.0–15.0)
MCH: 24.1 pg — AB (ref 26.0–34.0)
MCHC: 33.3 g/dL (ref 30.0–36.0)
MCV: 72.4 fL — AB (ref 78.0–100.0)
Platelets: 357 10*3/uL (ref 150–400)
RBC: 3.73 MIL/uL — ABNORMAL LOW (ref 3.87–5.11)
RDW: 17.2 % — AB (ref 11.5–15.5)
WBC: 7.5 10*3/uL (ref 4.0–10.5)

## 2016-02-13 MED ORDER — OMEPRAZOLE 20 MG PO CPDR: 20.0000 mg | DELAYED_RELEASE_CAPSULE | Freq: Every day | ORAL | 1 refills | Status: DC

## 2016-02-13 MED ORDER — ONDANSETRON 8 MG PO TBDP: 8.0000 mg | ORAL_TABLET | Freq: Three times a day (TID) | ORAL | 0 refills | Status: DC | PRN

## 2016-02-13 MED ORDER — DICYCLOMINE HCL 10 MG PO CAPS: 10.0000 mg | ORAL_CAPSULE | Freq: Three times a day (TID) | ORAL | 0 refills | Status: DC | PRN

## 2016-02-13 MED ORDER — ACETAMINOPHEN 325 MG PO TABS: 650.0000 mg | ORAL_TABLET | Freq: Four times a day (QID) | ORAL | 0 refills | Status: DC | PRN

## 2016-02-13 NOTE — Discharge Instructions (Signed)
·   Eating multiple small meals.  Cooking food until it is soft enough to chew easily.  Chewing your food well.  Drinking plenty of fluids..  Not eating foods that are very spicy, sour, or fatty.  Not eating citrus fruits, such as oranges and grapefruit.  Avoid fast food for the next 2 weeks

## 2016-02-13 NOTE — Discharge Summary (Signed)
Physician Discharge Summary  Hannah Hardy VHQ:[id number] DOB: 1990/07/03 DOA: 02/12/2016  PCP: No PCP Per Patient  Admit date: 02/12/2016 Discharge date: 02/13/2016  Time spent: 35 minutes  Recommendations for Outpatient Follow-up:  1. Repeat BMET to follow electrolytes  2. Repeat Amylase and Lipase    Discharge Diagnoses:  Active Problems:   Alcohol abuse   Hypokalemia   Elevated lipase   Enteritis   Dehydration   Leukocytosis   Obesity   Asthma   Discharge Condition: stable and improved. Instructed to establish care with PCP and arrange hospital follow visit in 2 weeks.  Diet recommendation: low fat diet   Filed Weights   02/12/16 1353  Weight: 93 kg (205 lb)    History of present illness:  26 y.o. female with PMH significant for obesity, asthma, alcohol abuse and tobacco abuse; who presented to ED complaining of nausea, vomiting and abd pain. Pain mid/low abdomen, non radiated, crampy in nature, with associated nausea and vomiting; no relief factors and associated also with anorexia. Symptoms present for over 10 days now. Patient with significant difficulties keeping things down and expressing lightheadedness when changing positions. Of note patient denies, fever, HA's, CP, SOB, hematemesis, hematuria, mel;ena, hematochezia or any other complaints.   Hospital Course:  1-acute enteritis: appears to be secondary to viral infection. Symptoms present for 10 days now and according to patient somewhat improving. She never took cipro and flagyl prescribed on 1/23. Repeat CT is demonstarting improvement in her intestinal inflammation. -will hold on abx's for now, as she is already improving and there is no signs of other infections or acute abnormalities  -patient received fluid resuscitation and electrolytes were repleted -WBC's WNL at discharge and no fever -will discharge with prescriptions for PRN analgesics and antiemetics  -advise to advance diet slowly  2-elevated  lipase/unspecified acute pancreatitis : -no pain in epigastric area on palpation and no abnormalities around her pancreas on CT scan -?? Acute phase reactivity component, vs non complicated acute pancreatitis  -patient feeling better and tolerating diet; no further vomiting -instruction for pancreatitis provided -will need repeat Amylase and Lipase as an outpatient to assure resolution of elevated levels; but patient clinically improved and wanting to be discharge  3-hx of Alcohol abuse: patient expressed not drinking for over 2 months now -recent ETOH level < 5 -abstinence encourage -patient encourage to use a MV with folic acid -patient w/o hx of ever experiencing DT's or withdrawal sx's  4-Hypokalemia -repleted -no abnormalities on telemetry  -normal Mg and Phosphorus level  5-Dehydration -due to poor PO intake and GI loses -resolved with fluid resuscitation -tolerating full liquid and soft diet   6-Leukocytosis -no fever present -most likely associated with stress demargination, other possibility is to be associated with enteritis -will follow trend -will hold on antibiotics for now  7-Obesity -Body mass index is 32.11 kg/m. -low calorie diet and exercise discussed with patient   8-Asthma -stable and patient no wheezing and denying SOB -will continue PRN albuterol   9-tobacco abuse and use of Marijuana -cessation counseling provided -nicotine patch ordered and declined.   Procedures:  See below for x-ray reports   Consultations:  None   Discharge Exam: Vitals:   02/12/16 2110 02/13/16 0452  BP: 91/60 105/67  Pulse: 71 75  Resp: 18 16  Temp: 98 F (36.7 C) 98.3 F (36.8 C)    General: no Fever, no vomiting and reports no CP or SOB. Still with mild nausea and some abd cramps. Patient is  really hungry and will like to eat regular consistency diet. Cardiovascular: S1 and S2, no rubs, no gallops, no murmurs Respiratory: CTA bilaterally Abd:  soft, ND, positive BM, no guarding, denies pain currently. Extremities: no edema, no cyanosis    Discharge Instructions   Discharge Instructions    Discharge instructions    Complete by:  As directed    Advance diet slowly Keep yourself well hydrated Take medications as prescribed  Increase exercise and follow low calorie diet Please no further alcohol, tobacco use and/or use of recreational drugs     Current Discharge Medication List    START taking these medications   Details  acetaminophen (TYLENOL) 325 MG tablet Take 2 tablets (650 mg total) by mouth every 6 (six) hours as needed for mild pain (or Fever >/= 101). Qty: 40 tablet, Refills: 0    dicyclomine (BENTYL) 10 MG capsule Take 1 capsule (10 mg total) by mouth every 8 (eight) hours as needed for spasms (abdominal cramps). Qty: 30 capsule, Refills: 0    omeprazole (PRILOSEC) 20 MG capsule Take 1 capsule (20 mg total) by mouth daily. Qty: 30 capsule, Refills: 1    ondansetron (ZOFRAN ODT) 8 MG disintegrating tablet Take 1 tablet (8 mg total) by mouth every 8 (eight) hours as needed for nausea or vomiting. Qty: 30 tablet, Refills: 0      CONTINUE these medications which have NOT CHANGED   Details  albuterol (PROVENTIL HFA;VENTOLIN HFA) 108 (90 Base) MCG/ACT inhaler Inhale 1-2 puffs into the lungs every 6 (six) hours as needed for wheezing or shortness of breath.      STOP taking these medications     ibuprofen (ADVIL,MOTRIN) 800 MG tablet      ciprofloxacin (CIPRO) 500 MG tablet      HYDROcodone-acetaminophen (NORCO/VICODIN) 5-325 MG tablet      metroNIDAZOLE (FLAGYL) 500 MG tablet      ondansetron (ZOFRAN) 4 MG tablet        No Known Allergies   The results of significant diagnostics from this hospitalization (including imaging, microbiology, ancillary and laboratory) are listed below for reference.    Significant Diagnostic Studies: Ct Abdomen Pelvis W Contrast  Result Date: 02/12/2016 CLINICAL  DATA:  Generalized abdominal pain and nausea vomiting for 10 days. EXAM: CT ABDOMEN AND PELVIS WITH CONTRAST TECHNIQUE: Multidetector CT imaging of the abdomen and pelvis was performed using the standard protocol following bolus administration of intravenous contrast. CONTRAST:  ISOVUE-300 IOPAMIDOL (ISOVUE-300) INJECTION 61% COMPARISON:  02/08/2016 FINDINGS: Lower chest: No acute abnormality. Hepatobiliary: No focal liver abnormality is seen. No gallstones, gallbladder wall thickening, or biliary dilatation. Pancreas: Unremarkable. No pancreatic ductal dilatation or surrounding inflammatory changes. Spleen: Normal in size without focal abnormality. Adrenals/Urinary Tract: Adrenal glands are unremarkable. Kidneys are normal, without renal calculi, focal lesion, or hydronephrosis. Bladder is unremarkable. Stomach/Bowel: Stomach is within normal limits. Appendix appears normal. Mild persistent mesenteric stranding surrounding the ascending colon, improved. Vascular/Lymphatic: No significant vascular findings are present. No enlarged abdominal or pelvic lymph nodes. Reproductive: Uterus and bilateral adnexa are unremarkable. 2.5 cm left adnexal simple appearing cyst, exophytic to the left ovary. Other: No abdominal wall hernia or abnormality. No abdominopelvic ascites. Musculoskeletal: No acute or significant osseous findings. IMPRESSION: Improved mesenteric stranding surrounding the ascending colon. Normal appendix. Left adnexal 2.5 cm cyst, exophytic to the left ovary. This may represent an exophytic left ovarian cyst, however focal dilation of the fallopian tube cannot be excluded. Further evaluation with pelvic ultrasound may be considered.  Electronically Signed   By: Ted Mcalpine M.D.   On: 02/12/2016 16:32   Ct Abdomen Pelvis W Contrast  Result Date: 02/08/2016 CLINICAL DATA:  Abdominal pain with nausea and vomiting EXAM: CT ABDOMEN AND PELVIS WITH CONTRAST TECHNIQUE: Multidetector CT imaging of  the abdomen and pelvis was performed using the standard protocol following bolus administration of intravenous contrast. CONTRAST:  ISOVUE-300 IOPAMIDOL (ISOVUE-300) INJECTION 61% COMPARISON:  July 16, 2011 FINDINGS: Lower chest: There is slight bibasilar atelectasis. Lung bases elsewhere clear. There is a small hiatal hernia. Hepatobiliary: There is a slight degree of fatty infiltration near the fissure for the ligamentum teres. Beyond this slight degree of fatty infiltration, no focal liver lesions are evident. Gallbladder wall is not appreciably thickened. There is no biliary duct dilatation. Pancreas: No pancreatic mass or inflammatory focus. Spleen: No splenic lesions are evident. Adrenals/Urinary Tract: Adrenals appear normal bilaterally. Kidneys bilaterally show no mass or hydronephrosis on either side. There is no renal or ureteral calculus on either side. Urinary bladder is midline with wall thickness within normal limits. Stomach/Bowel: There is mesenteric stranding surrounding much of the ascending colon extending to the level of the hepatic flexure. There is no appreciable diverticular disease in this area. The wall of the colon in this area does not appear appreciably thickened. Elsewhere, there is no appreciable bowel wall or mesenteric thickening. The colon is largely decompressed. There is no evident bowel obstruction. No free air or portal venous air. Vascular/Lymphatic: There is no abdominal aortic aneurysm. No vascular lesions are evident. There is no evident adenopathy in the abdomen or pelvis. Reproductive: Uterus is anteverted. There is a cystic area in the left ovary measuring 2.4 x 1.9 cm, a probable dominant physiologic follicle. There is no other pelvic mass. No pelvic fluid collection. Other: Appendix appears normal. No abscess or ascites evident in the abdomen or pelvis. There is a small ventral hernia containing only fat. Musculoskeletal: There are no blastic or lytic bone lesions.  There is no intramuscular or abdominal wall lesion evident. IMPRESSION: Evidence of mesenteric thickening surrounding portions of the ascending colon and hepatic flexure, felt to represent colitis of uncertain etiology. No associated bowel wall thickening. No perforation or diverticular disease in this area. No abscess. No bowel obstruction. Appendix appears normal. There is a small hiatal hernia. There is a small ventral hernia containing only fat. There is no renal or ureteral calculus.  No hydronephrosis. Probable dominant follicle left ovary. Electronically Signed   By: Bretta Bang III M.D.   On: 02/08/2016 08:55    Microbiology: No results found for this or any previous visit (from the past 240 hour(s)).   Labs: Basic Metabolic Panel:  Recent Labs Lab 02/08/16 0620 02/12/16 1404 02/12/16 2109 02/13/16 0750  NA 136 133*  --  131*  K 3.1* 2.7*  --  3.4*  CL 96* 90*  --  97*  CO2 26 26  --  26  GLUCOSE 112* 113*  --  111*  BUN 7 22*  --  14  CREATININE 0.67 1.03*  --  0.85  CALCIUM 9.4 9.7  --  8.5*  MG  --   --  2.1  --   PHOS  --   --  3.3  --    Liver Function Tests:  Recent Labs Lab 02/08/16 0620 02/12/16 1404  AST 18 20  ALT 11* 13*  ALKPHOS 46 51  BILITOT 0.7 1.1  PROT 8.4* 9.1*  ALBUMIN 3.8 4.2  Recent Labs Lab 02/08/16 0620 02/12/16 1404 02/12/16 2109 02/13/16 0750  LIPASE 138* 190*  --  290*  AMYLASE  --   --  278*  --    CBC:  Recent Labs Lab 02/08/16 0620 02/12/16 1404 02/13/16 0750  WBC 11.1* 11.5* 7.5  HGB 10.7* 11.1* 9.0*  HCT 30.6* 32.2* 27.0*  MCV 71.7* 70.8* 72.4*  PLT 290 508* 357    Signed:  Vassie Loll MD.  [facility] 02/13/2016, 10:19 AM

## 2016-05-02 ENCOUNTER — Encounter (HOSPITAL_COMMUNITY): Payer: Self-pay | Admitting: Emergency Medicine

## 2016-05-02 ENCOUNTER — Emergency Department (HOSPITAL_COMMUNITY)
Admission: EM | Admit: 2016-05-02 | Discharge: 2016-05-02 | Disposition: A | Discharge status: Home / Self Care | Payer: Self-pay | Attending: Emergency Medicine | Admitting: Emergency Medicine

## 2016-05-02 DIAGNOSIS — J45909 Unspecified asthma, uncomplicated: Secondary | ICD-10-CM | POA: Insufficient documentation

## 2016-05-02 DIAGNOSIS — R079 Chest pain, unspecified: Secondary | ICD-10-CM | POA: Insufficient documentation

## 2016-05-02 DIAGNOSIS — R1013 Epigastric pain: Secondary | ICD-10-CM | POA: Insufficient documentation

## 2016-05-02 DIAGNOSIS — N938 Other specified abnormal uterine and vaginal bleeding: Secondary | ICD-10-CM | POA: Insufficient documentation

## 2016-05-02 DIAGNOSIS — Z79899 Other long term (current) drug therapy: Secondary | ICD-10-CM | POA: Insufficient documentation

## 2016-05-02 DIAGNOSIS — F17210 Nicotine dependence, cigarettes, uncomplicated: Secondary | ICD-10-CM | POA: Insufficient documentation

## 2016-05-02 LAB — CBC WITH DIFFERENTIAL/PLATELET
BASOS ABS: 0 10*3/uL (ref 0.0–0.1)
Basophils Relative: 0 %
EOS ABS: 0.2 10*3/uL (ref 0.0–0.7)
EOS PCT: 3 %
HCT: 32.8 % — ABNORMAL LOW (ref 36.0–46.0)
HEMOGLOBIN: 10.9 g/dL — AB (ref 12.0–15.0)
Lymphocytes Relative: 26 %
Lymphs Abs: 1.6 10*3/uL (ref 0.7–4.0)
MCH: 25.2 pg — ABNORMAL LOW (ref 26.0–34.0)
MCHC: 33.2 g/dL (ref 30.0–36.0)
MCV: 75.8 fL — ABNORMAL LOW (ref 78.0–100.0)
Monocytes Absolute: 0.4 10*3/uL (ref 0.1–1.0)
Monocytes Relative: 7 %
NEUTROS PCT: 64 %
Neutro Abs: 4 10*3/uL (ref 1.7–7.7)
PLATELETS: 217 10*3/uL (ref 150–400)
RBC: 4.33 MIL/uL (ref 3.87–5.11)
RDW: 17.6 % — ABNORMAL HIGH (ref 11.5–15.5)
WBC: 6.3 10*3/uL (ref 4.0–10.5)

## 2016-05-02 LAB — URINALYSIS, ROUTINE W REFLEX MICROSCOPIC
BILIRUBIN URINE: NEGATIVE
Glucose, UA: NEGATIVE mg/dL
KETONES UR: NEGATIVE mg/dL
Nitrite: NEGATIVE
PH: 6 (ref 5.0–8.0)
PROTEIN: 100 mg/dL — AB
Specific Gravity, Urine: 1.03 (ref 1.005–1.030)

## 2016-05-02 LAB — COMPREHENSIVE METABOLIC PANEL
ALBUMIN: 3.5 g/dL (ref 3.5–5.0)
ALK PHOS: 39 U/L (ref 38–126)
ALT: 12 U/L — AB (ref 14–54)
AST: 22 U/L (ref 15–41)
Anion gap: 9 (ref 5–15)
BUN: 9 mg/dL (ref 6–20)
CHLORIDE: 105 mmol/L (ref 101–111)
CO2: 24 mmol/L (ref 22–32)
CREATININE: 0.6 mg/dL (ref 0.44–1.00)
Calcium: 9 mg/dL (ref 8.9–10.3)
GFR calc Af Amer: >60 mL/min (ref >60)
GFR calc non Af Amer: >60 mL/min (ref >60)
GLUCOSE: 93 mg/dL (ref 65–99)
Potassium: 4 mmol/L (ref 3.5–5.1)
SODIUM: 138 mmol/L (ref 135–145)
Total Bilirubin: 0.3 mg/dL (ref 0.3–1.2)
Total Protein: 6.7 g/dL (ref 6.5–8.1)

## 2016-05-02 LAB — I-STAT BETA HCG BLOOD, ED (MC, WL, AP ONLY): I-stat hCG, quantitative: <5 m[IU]/mL (ref <5)

## 2016-05-02 LAB — LIPASE, BLOOD: Lipase: 29 U/L (ref 11–51)

## 2016-05-02 MED ORDER — ONDANSETRON HCL 4 MG/2ML IJ SOLN
4.0000 mg | Freq: Once | INTRAMUSCULAR | Status: AC
  Administered 2016-05-02: 4 mg via INTRAVENOUS
  Filled 2016-05-02: qty 2

## 2016-05-02 MED ORDER — ONDANSETRON HCL 4 MG PO TABS: 4.0000 mg | ORAL_TABLET | Freq: Three times a day (TID) | ORAL | 0 refills | Status: DC | PRN

## 2016-05-02 MED ORDER — SODIUM CHLORIDE 0.9 % IV BOLUS (SEPSIS)
1000.0000 mL | Freq: Once | INTRAVENOUS | Status: AC
  Administered 2016-05-02: 1000 mL via INTRAVENOUS

## 2016-05-02 MED ORDER — FENTANYL CITRATE (PF) 100 MCG/2ML IJ SOLN
50.0000 ug | Freq: Once | INTRAMUSCULAR | Status: AC
  Administered 2016-05-02: 50 ug via INTRAVENOUS
  Filled 2016-05-02: qty 2

## 2016-05-02 MED ORDER — OMEPRAZOLE 20 MG PO CPDR: 20.0000 mg | DELAYED_RELEASE_CAPSULE | Freq: Every day | ORAL | 0 refills | Status: DC
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
## 2016-05-02 NOTE — ED Notes (Signed)
Pt states "I am going to [facility]. They will give me pain medicine for my gallbladder. I need to have it out" Pt states "I don't need this medicine-- I am not going to get it filled. "

## 2016-05-02 NOTE — ED Provider Notes (Signed)
[facility]-EMERGENCY DEPT Provider Note   CSN: [id number] Arrival date & time: 05/02/16  1010     History   Chief Complaint Chief Complaint  Patient presents with  . Chest Pain    HPI Hannah Hardy is a 26 y.o. female with a history of asthma, alcoholic pancreatitis.  HPI  She presents today with 4-5 days of worsening lower midline chest/upper midline abdominal pain. She reports that her pain has been worsening over the past few days and is currently a 9 out of 10. She reports that this feels consistent with the last time she was diagnosed with pancreatitis. Her pain is associated with nausea, vomiting, diarrhea, and headache.  She reports no fevers, chills, hot flashes at home.  She reports that she normally drinks 6 shots a week and denies having an alcoholic binge episode prior to the onset of her pain.  She has been trying goodies powder, ibuprofen for her pain however reports that these have not helped and may have given her a headache.    She additionally reports approximately 2 weeks of abnormal vaginal bleeding.  She says that this is not normal for her. She reports that she is sexually active and does not always use protection.  She was advised that we do not routinely provide STI screening for asymptomatic individuals and was referred to the wellness clinic/the health Department.   Past Medical History:  Diagnosis Date  . Asthma     Patient Active Problem List   Diagnosis Date Noted  . Hypokalemia 02/12/2016  . Elevated lipase 02/12/2016  . Enteritis 02/12/2016  . Dehydration 02/12/2016  . Leukocytosis 02/12/2016  . Obesity 02/12/2016  . Asthma 02/12/2016  . Cholelithiasis 11/26/2015  . Alcohol abuse 11/26/2015  . Acute pancreatitis without infection or necrosis 11/25/2015    History reviewed. No pertinent surgical history.  OB History    No data available       Home Medications    Prior to Admission medications   Medication Sig Start Date End Date Taking?  Authorizing Provider  acetaminophen (TYLENOL) 325 MG tablet Take 2 tablets (650 mg total) by mouth every 6 (six) hours as needed for mild pain (or Fever >/= 101). 02/13/16   Vassie Loll, MD  albuterol (PROVENTIL HFA;VENTOLIN HFA) 108 (90 Base) MCG/ACT inhaler Inhale 1-2 puffs into the lungs every 6 (six) hours as needed for wheezing or shortness of breath.    Historical Provider, MD  dicyclomine (BENTYL) 10 MG capsule Take 1 capsule (10 mg total) by mouth every 8 (eight) hours as needed for spasms (abdominal cramps). 02/13/16   Vassie Loll, MD  omeprazole (PRILOSEC) 20 MG capsule Take 1 capsule (20 mg total) by mouth daily. Take as soon as you wake up in the morning, before breakfast 05/02/16 05/09/16  Cristina Gong, PA-C  ondansetron (ZOFRAN ODT) 8 MG disintegrating tablet Take 1 tablet (8 mg total) by mouth every 8 (eight) hours as needed for nausea or vomiting. 02/13/16   Vassie Loll, MD  ondansetron (ZOFRAN) 4 MG tablet Take 1 tablet (4 mg total) by mouth every 8 (eight) hours as needed for nausea or vomiting. 05/02/16   Cristina Gong, PA-C    Family History No family history on file.  Social History Social History  Substance Use Topics  . Smoking status: Current Every Day Smoker    Packs/day: 1.00    Types: Cigarettes  . Smokeless tobacco: Never Used  . Alcohol use Yes     Comment: occ  Allergies   Patient has no known allergies.   Review of Systems Review of Systems  Constitutional: Positive for appetite change and fatigue. Negative for chills and fever.  HENT: Negative for ear pain and sore throat.   Eyes: Negative for pain and visual disturbance.  Respiratory: Negative for cough, chest tightness and shortness of breath.   Cardiovascular: Negative for chest pain and palpitations.  Gastrointestinal: Positive for abdominal pain, diarrhea, nausea and vomiting. Negative for abdominal distention.  Genitourinary: Positive for menstrual problem and vaginal bleeding.  Negative for decreased urine volume, dysuria, hematuria, pelvic pain, vaginal discharge and vaginal pain.  Musculoskeletal: Negative for arthralgias and back pain.  Skin: Negative for color change and rash.  Neurological: Positive for headaches. Negative for seizures, syncope, weakness and numbness.  All other systems reviewed and are negative.    Physical Exam Updated Vital Signs BP (!) 105/43   Pulse (!) 48   Temp 98.7 F (37.1 C) (Oral)   Resp 17   SpO2 99%   While HR has been low, patient was sleeping during the low readings.   Physical Exam  Constitutional: She appears well-developed and well-nourished. No distress.  HENT:  Head: Normocephalic and atraumatic.  Eyes: Conjunctivae are normal. Right eye exhibits no discharge. Left eye exhibits no discharge. No scleral icterus.  Neck: Normal range of motion. Neck supple.  Cardiovascular: Normal rate, regular rhythm and normal heart sounds.  Exam reveals no gallop and no friction rub.   No murmur heard. Pulmonary/Chest: Effort normal and breath sounds normal. No respiratory distress. She has no wheezes.  Abdominal: Soft. Normal appearance and bowel sounds are normal. She exhibits no distension, no fluid wave and no ascites. There is no hepatosplenomegaly. There is tenderness (Tenderness to palpation) in the epigastric area. There is no guarding, no tenderness at McBurney's point and negative Murphy's sign.  Musculoskeletal: She exhibits no edema.  Neurological: She is alert. Coordination normal.  Skin: Skin is warm and dry.  Psychiatric: She has a normal mood and affect.  Nursing note and vitals reviewed.    ED Treatments / Results  Labs (all labs ordered are listed, but only abnormal results are displayed) Labs Reviewed  CBC WITH DIFFERENTIAL/PLATELET - Abnormal; Notable for the following:       Result Value   Hemoglobin 10.9 (*)    HCT 32.8 (*)    MCV 75.8 (*)    MCH 25.2 (*)    RDW 17.6 (*)    All other components  within normal limits  COMPREHENSIVE METABOLIC PANEL - Abnormal; Notable for the following:    ALT 12 (*)    All other components within normal limits  URINALYSIS, ROUTINE W REFLEX MICROSCOPIC - Abnormal; Notable for the following:    Color, Urine AMBER (*)    APPearance CLOUDY (*)    Hgb urine dipstick MODERATE (*)    Protein, ur 100 (*)    Leukocytes, UA TRACE (*)    Bacteria, UA MANY (*)    Squamous Epithelial / LPF 6-30 (*)    All other components within normal limits  LIPASE, BLOOD  I-STAT BETA HCG BLOOD, ED ([facility], [facility], [facility] ONLY)    EKG  EKG Interpretation  Date/Time:  Tuesday May 02 2016 10:15:11 EDT Ventricular Rate:  80 PR Interval:  132 QRS Duration: 88 QT Interval:  358 QTC Calculation: 412 R Axis:   74 Text Interpretation:  Normal sinus rhythm Normal ECG No old tracing to compare Confirmed by Karma Ganja  MD, MARTHA (  [id number]) on 05/02/2016 11:11:39 AM       Radiology No results found.  Procedures Procedures (including critical care time)  Medications Ordered in ED Medications  sodium chloride 0.9 % bolus 1,000 mL (0 mLs Intravenous Stopped 05/02/16 1437)  ondansetron (ZOFRAN) injection 4 mg (4 mg Intravenous Given 05/02/16 1209)  fentaNYL (SUBLIMAZE) injection 50 mcg (50 mcg Intravenous Given 05/02/16 1209)     Initial Impression / Assessment and Plan / ED Course  I have reviewed the triage vital signs and the nursing notes.  Pertinent labs & imaging results that were available during my care of the patient were reviewed by me and considered in my medical decision making (see chart for details).      1240- Recheck, patient states pain is down to 3/10, no longer nauseous.  1430- went to discuss discharge with patient.  She was sleeping soundly, in no distress    Final Clinical Impressions(s) / ED Diagnoses    Patient is nontoxic, nonseptic appearing, in no apparent distress.  Patient's pain and other symptoms adequately managed in emergency department.  Fluid  bolus given.  Labs, imaging and vitals reviewed.  Patient does not meet the SIRS or Sepsis criteria.  On repeat exam patient does not have a surgical abdomin and there are no peritoneal signs.  No indication of appendicitis, bowel obstruction, bowel perforation, cholecystitis, diverticulitis, PID or ectopic pregnancy.  Patients labs do not appear consistent with pancreatitis. Patient discharged home with symptomatic treatment and given strict instructions for follow-up with their primary care physician.  I have also discussed reasons to return immediately to the ER.  Patient expresses understanding and agrees with plan.  Patient will be discharged with dc for omeprazole and zofran.     At this time there does not appear to be any evidence of an acute emergency medical condition and the patient appears stable for discharge with appropriate outpatient follow up.Diagnosis was discussed with patient who verbalizes understanding and is agreeable to discharge. Pt case discussed with Dr. Karma Ganja who agrees with my plan.     Final diagnoses:  Epigastric pain    New Prescriptions New Prescriptions   OMEPRAZOLE (PRILOSEC) 20 MG CAPSULE    Take 1 capsule (20 mg total) by mouth daily. Take as soon as you wake up in the morning, before breakfast   ONDANSETRON (ZOFRAN) 4 MG TABLET    Take 1 tablet (4 mg total) by mouth every 8 (eight) hours as needed for nausea or vomiting.     Cristina Gong, PA-C 05/02/16 1926    Jerelyn Scott, MD 05/03/16 [phone]

## 2016-05-02 NOTE — ED Triage Notes (Signed)
Pt states for the last 4 days she has had tightness in her chest with sob. Pt has history of asthma and has been using her at home inhaler with no relief. Pt is breathing easily on arrival. ambulatory at triage in no acute distress.

## 2016-06-18 ENCOUNTER — Encounter (HOSPITAL_COMMUNITY): Payer: Self-pay

## 2016-06-18 ENCOUNTER — Emergency Department (HOSPITAL_COMMUNITY)
Admission: EM | Admit: 2016-06-18 | Discharge: 2016-06-18 | Disposition: A | Discharge status: Home / Self Care | Payer: Self-pay | Attending: Emergency Medicine | Admitting: Emergency Medicine

## 2016-06-18 DIAGNOSIS — J45909 Unspecified asthma, uncomplicated: Secondary | ICD-10-CM | POA: Insufficient documentation

## 2016-06-18 DIAGNOSIS — Y929 Unspecified place or not applicable: Secondary | ICD-10-CM | POA: Insufficient documentation

## 2016-06-18 DIAGNOSIS — S0993XA Unspecified injury of face, initial encounter: Secondary | ICD-10-CM

## 2016-06-18 DIAGNOSIS — W228XXA Striking against or struck by other objects, initial encounter: Secondary | ICD-10-CM | POA: Insufficient documentation

## 2016-06-18 DIAGNOSIS — F17210 Nicotine dependence, cigarettes, uncomplicated: Secondary | ICD-10-CM | POA: Insufficient documentation

## 2016-06-18 DIAGNOSIS — Y999 Unspecified external cause status: Secondary | ICD-10-CM | POA: Insufficient documentation

## 2016-06-18 DIAGNOSIS — Y939 Activity, unspecified: Secondary | ICD-10-CM | POA: Insufficient documentation

## 2016-06-18 DIAGNOSIS — Z79899 Other long term (current) drug therapy: Secondary | ICD-10-CM | POA: Insufficient documentation

## 2016-06-18 DIAGNOSIS — S01511A Laceration without foreign body of lip, initial encounter: Secondary | ICD-10-CM | POA: Insufficient documentation

## 2016-06-18 MED ORDER — HYDROCODONE-ACETAMINOPHEN 5-325 MG PO TABS
1.0000 | ORAL_TABLET | Freq: Once | ORAL | Status: AC
  Administered 2016-06-18: 1 via ORAL
  Filled 2016-06-18: qty 1

## 2016-06-18 MED ORDER — HYDROCODONE-ACETAMINOPHEN 5-325 MG PO TABS: 1.0000 | ORAL_TABLET | Freq: Four times a day (QID) | ORAL | 0 refills | Status: DC | PRN
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
## 2016-06-18 NOTE — Discharge Instructions (Signed)
Please read instructions below. Apply ice to your face for 20 minutes at a time. You can take Norco every 6hours as needed for severe pain. Do not drive or drink alcohol with this medication. Take 600 mg of ibuprofen every 6 hours for pain. Schedule an appointment with [facility] if symptoms do not improve. Return to the ER for inability to open or close your jaw, faculty swallowing, or new or concerning symptoms.
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
## 2016-06-18 NOTE — ED Notes (Signed)
Declined W/C at D/C and was escorted to lobby by RN. 
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
## 2017-06-20 ENCOUNTER — Emergency Department (HOSPITAL_COMMUNITY)
Admission: EM | Admit: 2017-06-20 | Discharge: 2017-06-20 | Disposition: A | Discharge status: Home / Self Care | Payer: Self-pay | Attending: Emergency Medicine | Admitting: Emergency Medicine

## 2017-06-20 ENCOUNTER — Encounter (HOSPITAL_COMMUNITY): Payer: Self-pay | Admitting: Emergency Medicine

## 2017-06-20 ENCOUNTER — Other Ambulatory Visit: Payer: Self-pay

## 2017-06-20 DIAGNOSIS — Z79899 Other long term (current) drug therapy: Secondary | ICD-10-CM | POA: Insufficient documentation

## 2017-06-20 DIAGNOSIS — J45909 Unspecified asthma, uncomplicated: Secondary | ICD-10-CM | POA: Insufficient documentation

## 2017-06-20 DIAGNOSIS — N898 Other specified noninflammatory disorders of vagina: Secondary | ICD-10-CM | POA: Insufficient documentation

## 2017-06-20 DIAGNOSIS — F17210 Nicotine dependence, cigarettes, uncomplicated: Secondary | ICD-10-CM | POA: Insufficient documentation

## 2017-06-20 LAB — WET PREP, GENITAL
Sperm: NONE SEEN
Trich, Wet Prep: NONE SEEN
Yeast Wet Prep HPF POC: NONE SEEN

## 2017-06-20 LAB — POC URINE PREG, ED: Preg Test, Ur: NEGATIVE

## 2017-06-20 MED ORDER — AZITHROMYCIN 250 MG PO TABS
1000.0000 mg | ORAL_TABLET | Freq: Once | ORAL | Status: AC
  Administered 2017-06-20: 1000 mg via ORAL
  Filled 2017-06-20: qty 4

## 2017-06-20 MED ORDER — LIDOCAINE HCL (PF) 1 % IJ SOLN
INTRAMUSCULAR | Status: AC
  Filled 2017-06-20: qty 5

## 2017-06-20 MED ORDER — ONDANSETRON 4 MG PO TBDP
4.0000 mg | ORAL_TABLET | Freq: Once | ORAL | Status: AC
  Administered 2017-06-20: 4 mg via ORAL
  Filled 2017-06-20: qty 1

## 2017-06-20 MED ORDER — METRONIDAZOLE 500 MG PO TABS: 500.0000 mg | ORAL_TABLET | Freq: Two times a day (BID) | ORAL | 0 refills | Status: DC

## 2017-06-20 MED ORDER — CEFTRIAXONE SODIUM 250 MG IJ SOLR
250.0000 mg | Freq: Once | INTRAMUSCULAR | Status: AC
  Administered 2017-06-20: 250 mg via INTRAMUSCULAR
  Filled 2017-06-20: qty 250

## 2017-06-20 NOTE — ED Provider Notes (Signed)
[facility] EMERGENCY DEPARTMENT Provider Note   CSN: [id number] Arrival date & time: 06/20/17  1101  History   Chief Complaint No chief complaint on file.   HPI Hannah Hardy is a 27 y.o. female.  HPI   28 year old female presents today with complaints of STD.  Patient notes her significant other was tested and had a positive chlamydia test.  Patient notes she has had small amount of yellow discharge in the last several days.  Patient denies any abdominal pain nausea or fever.  No other complaints here today.     Past Medical History:  Diagnosis Date  . Asthma     Patient Active Problem List   Diagnosis Date Noted  . Hypokalemia 02/12/2016  . Elevated lipase 02/12/2016  . Enteritis 02/12/2016  . Dehydration 02/12/2016  . Leukocytosis 02/12/2016  . Obesity 02/12/2016  . Asthma 02/12/2016  . Cholelithiasis 11/26/2015  . Alcohol abuse 11/26/2015  . Acute pancreatitis without infection or necrosis 11/25/2015    History reviewed. No pertinent surgical history.   OB History   None      Home Medications    Prior to Admission medications   Medication Sig Start Date End Date Taking? Authorizing Provider  acetaminophen (TYLENOL) 325 MG tablet Take 2 tablets (650 mg total) by mouth every 6 (six) hours as needed for mild pain (or Fever >/= 101). 02/13/16   Vassie Loll, MD  albuterol (PROVENTIL HFA;VENTOLIN HFA) 108 (90 Base) MCG/ACT inhaler Inhale 1-2 puffs into the lungs every 6 (six) hours as needed for wheezing or shortness of breath.    [provider]  dicyclomine (BENTYL) 10 MG capsule Take 1 capsule (10 mg total) by mouth every 8 (eight) hours as needed for spasms (abdominal cramps). 02/13/16   Vassie Loll, MD  HYDROcodone-acetaminophen (NORCO/VICODIN) 5-325 MG tablet Take 1 tablet by mouth every 6 (six) hours as needed for severe pain. 06/18/16   Robinson, [place] N, PA-C  metroNIDAZOLE (FLAGYL) 500 MG tablet Take 1 tablet (500 mg  total) by mouth 2 (two) times daily. 06/20/17   Job Holtsclaw, Tinnie Gens, PA-C  omeprazole (PRILOSEC) 20 MG capsule Take 1 capsule (20 mg total) by mouth daily. Take as soon as you wake up in the morning, before breakfast 05/02/16 05/09/16  Cristina Gong, PA-C  ondansetron (ZOFRAN ODT) 8 MG disintegrating tablet Take 1 tablet (8 mg total) by mouth every 8 (eight) hours as needed for nausea or vomiting. 02/13/16   Vassie Loll, MD  ondansetron (ZOFRAN) 4 MG tablet Take 1 tablet (4 mg total) by mouth every 8 (eight) hours as needed for nausea or vomiting. 05/02/16   Cristina Gong, PA-C    Family History History reviewed. No pertinent family history.  Social History Social History   Tobacco Use  . Smoking status: Current Every Day Smoker    Packs/day: 1.00    Types: Cigarettes  . Smokeless tobacco: Never Used  Substance Use Topics  . Alcohol use: Yes    Comment: occ  . Drug use: Yes    Types: Marijuana    Comment: Quit marijuana     Allergies   Patient has no known allergies.   Review of Systems Review of Systems  All other systems reviewed and are negative.    Physical Exam Updated Vital Signs BP (!) 134/97 (BP Location: Right Arm)   Pulse 72   Temp 98.4 F (36.9 C) (Oral)   Resp 16   Ht 5\' 7"  (1.702 m)   Wt  93 kg (205 lb)   LMP 06/16/2017   SpO2 96%   BMI 32.11 kg/m   Physical Exam  Constitutional: She is oriented to person, place, and time. She appears well-developed and well-nourished.  HENT:  Head: Normocephalic and atraumatic.  Eyes: Pupils are equal, round, and reactive to light. Conjunctivae are normal. Right eye exhibits no discharge. Left eye exhibits no discharge. No scleral icterus.  Neck: Normal range of motion. No JVD present. No tracheal deviation present.  Pulmonary/Chest: Effort normal. No stridor.  Abdominal: Soft. She exhibits no distension and no mass. There is no tenderness. There is no rebound and no guarding. No hernia.  Genitourinary:    Genitourinary Comments: Small amount of white discharge noted vaginal vault, no cervical motion tenderness, no adnexal tenderness or masses, no bleeding  Neurological: She is alert and oriented to person, place, and time. Coordination normal.  Psychiatric: She has a normal mood and affect. Her behavior is normal. Judgment and thought content normal.  Nursing note and vitals reviewed.   ED Treatments / Results  Labs (all labs ordered are listed, but only abnormal results are displayed) Labs Reviewed  WET PREP, GENITAL - Abnormal; Notable for the following components:      Result Value   Clue Cells Wet Prep HPF POC PRESENT (*)    WBC, Wet Prep HPF POC MANY (*)    All other components within normal limits  POC URINE PREG, ED  GC/CHLAMYDIA PROBE AMP ([place]    EKG None  Radiology No results found.  Procedures Procedures (including critical care time)  Medications Ordered in ED Medications  lidocaine (PF) (XYLOCAINE) 1 % injection (has no administration in time range)  cefTRIAXone (ROCEPHIN) injection 250 mg (250 mg Intramuscular Given 06/20/17 1304)  azithromycin (ZITHROMAX) tablet 1,000 mg (1,000 mg Oral Given 06/20/17 1304)  ondansetron (ZOFRAN-ODT) disintegrating tablet 4 mg (4 mg Oral Given 06/20/17 1345)     Initial Impression / Assessment and Plan / ED Course  I have reviewed the triage vital signs and the nursing notes.  Pertinent labs & imaging results that were available during my care of the patient were reviewed by me and considered in my medical decision making (see chart for details).     Labs: Wet prep, point-of-care urine pregnancy  Imaging:  Consults:  Therapeutics:  Discharge Meds:   Assessment/Plan: 27 year old female presents today with complaints of vaginal discharge and STD exposure.  She has clue cells on wet prep treated for bacterial vaginosis, also prophylactically treated for gonorrhea and chlamydia.  Patient with no signs of  PID nor significant infection discharged with symptomatic care instructions and strict return precautions.   Final Clinical Impressions(s) / ED Diagnoses   Final diagnoses:  Vaginal discharge    ED Discharge Orders        Ordered    metroNIDAZOLE (FLAGYL) 500 MG tablet  2 times daily     06/20/17 1340       Eyvonne MechanicHedges, Jaiya Mooradian, PA-C 06/20/17 1602    Benjiman CorePickering, Nathan, MD 06/20/17 2211
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
## 2017-06-20 NOTE — Discharge Instructions (Addendum)
Please read attached information. If you experience any new or worsening signs or symptoms please return to the emergency room for evaluation. Please follow-up with your primary care provider or specialist as discussed. Please use medication prescribed only as directed and discontinue taking if you have any concerning signs or symptoms.   °

## 2017-06-20 NOTE — ED Notes (Addendum)
Pt verbalizes understanding of d/c instructions. Pt received prescriptions. Pt ambulatory at d/c with all belongings.  

## 2017-06-21 LAB — GC/CHLAMYDIA PROBE AMP (~~LOC~~) NOT AT ARMC
Chlamydia: NEGATIVE
Neisseria Gonorrhea: NEGATIVE

## 2017-12-25 ENCOUNTER — Emergency Department (HOSPITAL_COMMUNITY)
Admission: EM | Admit: 2017-12-25 | Discharge: 2017-12-25 | Disposition: A | Discharge status: Home / Self Care | Payer: Self-pay | Attending: Emergency Medicine | Admitting: Emergency Medicine

## 2017-12-25 ENCOUNTER — Encounter (HOSPITAL_COMMUNITY): Payer: Self-pay | Admitting: Emergency Medicine

## 2017-12-25 ENCOUNTER — Other Ambulatory Visit: Payer: Self-pay

## 2017-12-25 DIAGNOSIS — X838XXA Intentional self-harm by other specified means, initial encounter: Secondary | ICD-10-CM | POA: Insufficient documentation

## 2017-12-25 DIAGNOSIS — F1024 Alcohol dependence with alcohol-induced mood disorder: Secondary | ICD-10-CM | POA: Insufficient documentation

## 2017-12-25 DIAGNOSIS — F17210 Nicotine dependence, cigarettes, uncomplicated: Secondary | ICD-10-CM | POA: Insufficient documentation

## 2017-12-25 DIAGNOSIS — IMO0002 Reserved for concepts with insufficient information to code with codable children: Secondary | ICD-10-CM

## 2017-12-25 DIAGNOSIS — S60811A Abrasion of right wrist, initial encounter: Secondary | ICD-10-CM | POA: Insufficient documentation

## 2017-12-25 DIAGNOSIS — Z7289 Other problems related to lifestyle: Secondary | ICD-10-CM

## 2017-12-25 DIAGNOSIS — Y998 Other external cause status: Secondary | ICD-10-CM | POA: Insufficient documentation

## 2017-12-25 DIAGNOSIS — Y9389 Activity, other specified: Secondary | ICD-10-CM | POA: Insufficient documentation

## 2017-12-25 DIAGNOSIS — S60812A Abrasion of left wrist, initial encounter: Secondary | ICD-10-CM | POA: Insufficient documentation

## 2017-12-25 DIAGNOSIS — F10929 Alcohol use, unspecified with intoxication, unspecified: Secondary | ICD-10-CM

## 2017-12-25 DIAGNOSIS — Y9289 Other specified places as the place of occurrence of the external cause: Secondary | ICD-10-CM | POA: Insufficient documentation

## 2017-12-25 DIAGNOSIS — R45851 Suicidal ideations: Secondary | ICD-10-CM | POA: Insufficient documentation

## 2017-12-25 LAB — I-STAT BETA HCG BLOOD, ED (MC, WL, AP ONLY): I-stat hCG, quantitative: <5 m[IU]/mL (ref <5)

## 2017-12-25 LAB — COMPREHENSIVE METABOLIC PANEL
ALT: 24 U/L (ref 0–44)
AST: 58 U/L — ABNORMAL HIGH (ref 15–41)
Albumin: 4.6 g/dL (ref 3.5–5.0)
Alkaline Phosphatase: 49 U/L (ref 38–126)
Anion gap: 12 (ref 5–15)
BUN: 6 mg/dL (ref 6–20)
CHLORIDE: 108 mmol/L (ref 98–111)
CO2: 21 mmol/L — ABNORMAL LOW (ref 22–32)
CREATININE: 0.58 mg/dL (ref 0.44–1.00)
Calcium: 9.1 mg/dL (ref 8.9–10.3)
GFR calc Af Amer: >60 mL/min (ref >60)
Glucose, Bld: 104 mg/dL — ABNORMAL HIGH (ref 70–99)
Potassium: 3.7 mmol/L (ref 3.5–5.1)
Sodium: 141 mmol/L (ref 135–145)
TOTAL PROTEIN: 8.1 g/dL (ref 6.5–8.1)
Total Bilirubin: 0.5 mg/dL (ref 0.3–1.2)

## 2017-12-25 LAB — RAPID URINE DRUG SCREEN, HOSP PERFORMED
Amphetamines: NOT DETECTED
Barbiturates: NOT DETECTED
Benzodiazepines: NOT DETECTED
COCAINE: NOT DETECTED
Opiates: NOT DETECTED
TETRAHYDROCANNABINOL: NOT DETECTED

## 2017-12-25 LAB — CBC
HEMATOCRIT: 35.4 % — AB (ref 36.0–46.0)
HEMOGLOBIN: 12.2 g/dL (ref 12.0–15.0)
MCH: 26.1 pg (ref 26.0–34.0)
MCHC: 34.5 g/dL (ref 30.0–36.0)
MCV: 75.8 fL — ABNORMAL LOW (ref 80.0–100.0)
Platelets: 201 10*3/uL (ref 150–400)
RBC: 4.67 MIL/uL (ref 3.87–5.11)
RDW: 13.4 % (ref 11.5–15.5)
WBC: 6.4 10*3/uL (ref 4.0–10.5)
nRBC: 0.3 % — ABNORMAL HIGH (ref 0.0–0.2)

## 2017-12-25 LAB — ETHANOL: ALCOHOL ETHYL (B): 304 mg/dL — AB (ref <10)

## 2017-12-25 LAB — ACETAMINOPHEN LEVEL: Acetaminophen (Tylenol), Serum: <10 ug/mL — ABNORMAL LOW (ref 10–30)

## 2017-12-25 LAB — SALICYLATE LEVEL: Salicylate Lvl: <7 mg/dL (ref 2.8–30.0)

## 2017-12-25 MED ORDER — STERILE WATER FOR INJECTION IJ SOLN
INTRAMUSCULAR | Status: AC
  Administered 2017-12-25: 06:00:00
  Filled 2017-12-25: qty 10

## 2017-12-25 MED ORDER — LORAZEPAM 2 MG/ML IJ SOLN
1.0000 mg | Freq: Once | INTRAMUSCULAR | Status: AC
  Administered 2017-12-25: 1 mg via INTRAMUSCULAR
  Filled 2017-12-25: qty 1

## 2017-12-25 MED ORDER — ZIPRASIDONE MESYLATE 20 MG IM SOLR
20.0000 mg | Freq: Once | INTRAMUSCULAR | Status: AC
  Administered 2017-12-25: 20 mg via INTRAMUSCULAR
  Filled 2017-12-25: qty 20

## 2017-12-25 MED ORDER — DIPHENHYDRAMINE HCL 50 MG/ML IJ SOLN
50.0000 mg | Freq: Once | INTRAMUSCULAR | Status: AC
  Administered 2017-12-25: 50 mg via INTRAMUSCULAR
  Filled 2017-12-25: qty 1

## 2017-12-25 MED ORDER — TETANUS-DIPHTH-ACELL PERTUSSIS 5-2.5-18.5 LF-MCG/0.5 IM SUSP: 0.5000 mL | Freq: Once | INTRAMUSCULAR | Status: DC
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
## 2017-12-25 NOTE — ED Notes (Signed)
[facility]: [facility] Expected date:  Expected time:  Means of arrival:  Comments: 
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
## 2017-12-25 NOTE — ED Provider Notes (Signed)
5:10 PM Assumed care from Drs. Lynelle DoctorKnapp and Delo, please see their note for full history, physical and decision making until this point. In brief this is a 27 y.o. year old female who presented to the ED tonight with IVC; Medical Clearance; and Suicidal     Clinically sober and denies suicidality.  Has remorse for her actions.  Plans to get some help.  The future.  Low suspicion that she is going to go commit suicide.  Stable for discharge with outpatient follow up. Will rescind IVC.  Discharge instructions, including strict return precautions for new or worsening symptoms, given. Patient and/or family verbalized understanding and agreement with the plan as described.   Labs, studies and imaging reviewed by myself and considered in medical decision making if ordered. Imaging interpreted by radiology.  Labs Reviewed  COMPREHENSIVE METABOLIC PANEL - Abnormal; Notable for the following components:      Result Value   CO2 21 (*)    Glucose, Bld 104 (*)    AST 58 (*)    All other components within normal limits  ETHANOL - Abnormal; Notable for the following components:   Alcohol, Ethyl (B) 304 (*)    All other components within normal limits  ACETAMINOPHEN LEVEL - Abnormal; Notable for the following components:   Acetaminophen (Tylenol), Serum <10 (*)    All other components within normal limits  CBC - Abnormal; Notable for the following components:   HCT 35.4 (*)    MCV 75.8 (*)    nRBC 0.3 (*)    All other components within normal limits  SALICYLATE LEVEL  RAPID URINE DRUG SCREEN, HOSP PERFORMED  I-STAT BETA HCG BLOOD, ED ([facility], [facility], [facility] ONLY)    No orders to display    No follow-ups on file.    Marily MemosMesner, Cecile Guevara, MD 12/25/17 [phone]

## 2017-12-25 NOTE — ED Notes (Signed)
Pt now awake, voicing no complaints at this time. Encouragement and support provided. Special checks q 15 mins in place for safety, Video monitoring in place. Will continue to monitor.

## 2017-12-25 NOTE — BH Assessment (Signed)
[facility]  Per Caryn Beeakia Perry Starkes, NP, this pt does not require psychiatric hospitalization at this time.  Pt presents under IVC initiated by law enforcement and upheld by EDP Devoria AlbeIva Knapp, MD.  EDP Geoffery Lyonsouglas Delo, MD agrees to speak to pt to consider rescinding IVC; this is pending as of this writing.  Discharge instructions include referral information for [facility].  Pt's nurse, Morrie SheldonAshley, agrees to notify this Clinical research associatewriter when Dr Judd Lienelo sees pt.  Doylene Canninghomas Kyndahl Jablon, MA Triage Specialist [phone]

## 2017-12-25 NOTE — ED Notes (Signed)
Pt d/c home per MD order. Discharge summary reviewed with pt. Pt verbalizes understanding. Pt denies SI/HI/AVH. Personal property returned. Pt ambulatory off unit with [facility].
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
## 2017-12-25 NOTE — ED Notes (Signed)
psych team notified that pt is now awake
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
## 2017-12-25 NOTE — ED Notes (Signed)
Provider at bedside
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
## 2017-12-25 NOTE — Discharge Instructions (Signed)
Low-Fat Diet for Pancreatitis or Gallbladder Conditions A low-fat diet can be helpful if you have pancreatitis or a gallbladder condition. With these conditions, your pancreas and gallbladder have trouble digesting fats. A healthy eating plan with less fat will help rest your pancreas and gallbladder and reduce your symptoms. WHAT DO I NEED TO KNOW ABOUT THIS DIET?  Eat a low-fat diet.  Reduce your fat intake to less than 20-30% of your total daily calories. This is less than 50-60 g of fat per day.  Remember that you need some fat in your diet. Ask your dietician what your daily goal should be.  Choose nonfat and low-fat healthy foods. Look for the words "nonfat," "low fat," or "fat free."  As a guide, look on the label and choose foods with less than 3 g of fat per serving. Eat only one serving.  Avoid alcohol.  Do not smoke. If you need help quitting, talk with your health care provider.  Eat small frequent meals instead of three large heavy meals. WHAT FOODS CAN I EAT? Grains Include healthy grains and starches such as potatoes, wheat bread, fiber-rich cereal, and brown rice. Choose whole grain options whenever possible. In adults, whole grains should account for 45-65% of your daily calories.  Fruits and Vegetables Eat plenty of fruits and vegetables. Fresh fruits and vegetables add fiber to your diet. Meats and Other Protein Sources Eat lean meat such as chicken and pork. Trim any fat off of meat before cooking it. Eggs, fish, and beans are other sources of protein. In adults, these foods should account for 10-35% of your daily calories. Dairy Choose low-fat milk and dairy options. Dairy includes fat and protein, as well as calcium.  Fats and Oils Limit high-fat foods such as fried foods, sweets, baked goods, sugary drinks.  Other Creamy sauces and condiments, such as mayonnaise, can add extra fat. Think about whether or not you need to use them, or use smaller amounts or low fat  options. WHAT FOODS ARE NOT RECOMMENDED?  High fat foods, such as:  [employer]aked goods.  Ice cream.  [place] toast.  Sweet rolls.  Pizza.  Cheese bread.  Foods covered with batter, butter, creamy sauces, or cheese.  Fried foods.  Sugary drinks and desserts.  Foods that cause gas or bloating   This information is not intended to replace advice given to you by your health care provider. Make sure you discuss any questions you have with your health care provider.   Document Released: 01/07/2013 Document Reviewed: 01/07/2013 Elsevier Interactive Patient Education [employer]2016 Elsevier Inc.  To help you maintain a sober lifestyle, a substance abuse treatment program may be beneficial to you.  Contact [facility] at your earliest opportunity to ask about enrolling in their program:       [facility]      [address]      [place], [place] [address]      [phone]      New patients are seen at the walk-in clinic every Tuesday from 9:00 am - 12:00 pm

## 2017-12-25 NOTE — ED Notes (Signed)
Pt currently sleeping. No s/s of distress noted. Breathing non labored, respirations equal. Special checks q 15 mins in place for safety, video monitoring in place. Will continue to monitor.

## 2017-12-25 NOTE — ED Notes (Signed)
Bed: [id number] Expected date:  Expected time:  Means of arrival:  Comments: 

## 2017-12-25 NOTE — ED Notes (Signed)
Pt talking on hallway phone.  

## 2018-02-05 IMAGING — CT CT ABD-PELV W/ CM
2 of 4 series · 15 of 46 positions shown, 17 images · IV contrast (ISOVUE)
Comparison: July 16, 2011

CLINICAL DATA: Abdominal pain with nausea and vomiting

EXAM:
CT ABDOMEN AND PELVIS WITH CONTRAST
TECHNIQUE: Multidetector CT imaging of the abdomen and pelvis was performed
using the standard protocol following bolus administration of
intravenous contrast.
CONTRAST:  100mL FJQ5PT-LQQ IOPAMIDOL (FJQ5PT-LQQ) INJECTION 61%

[Series 2: abd/pel with · axial · 0.79mm/px · z∈[-533,-113]mm · 12 of 94 slices shown, 14 images]
[im 5/94  soft-tissue]
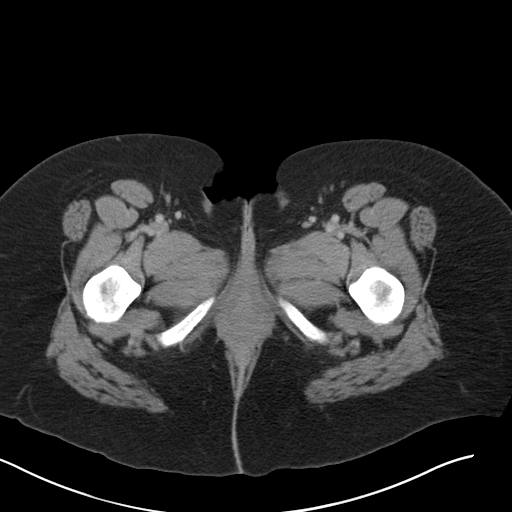
[im 5/94  bone]
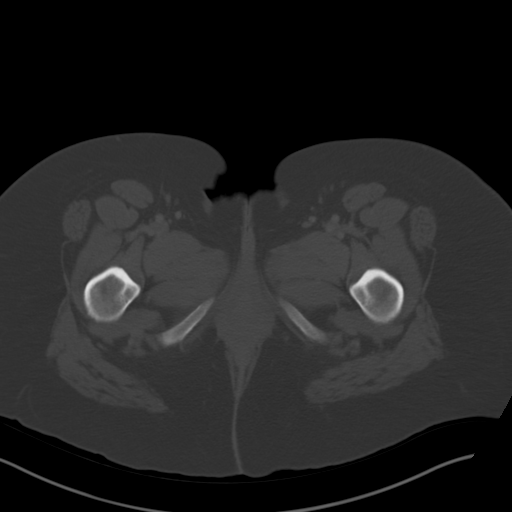
[im 14/94  soft-tissue]
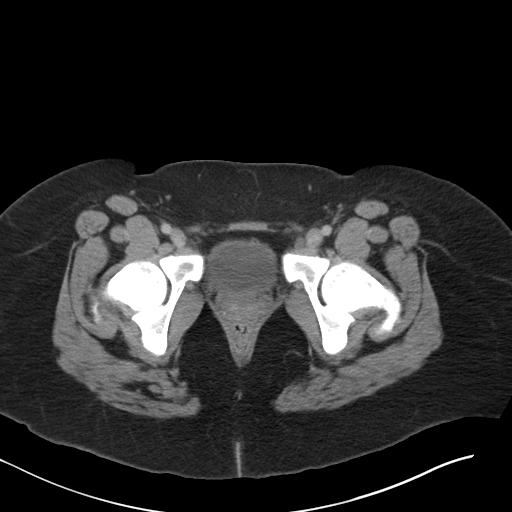
[im 19/94  soft-tissue]
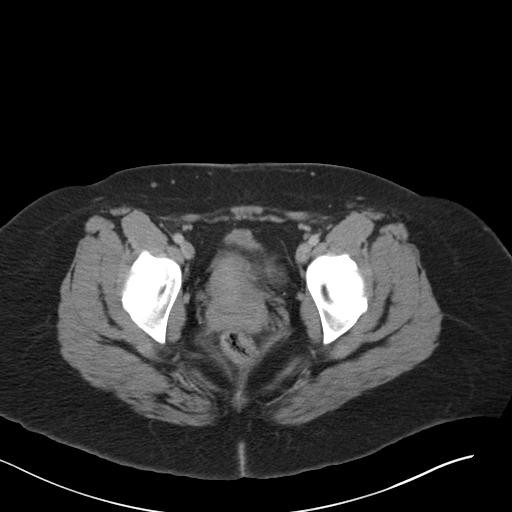
[im 28/94  soft-tissue]
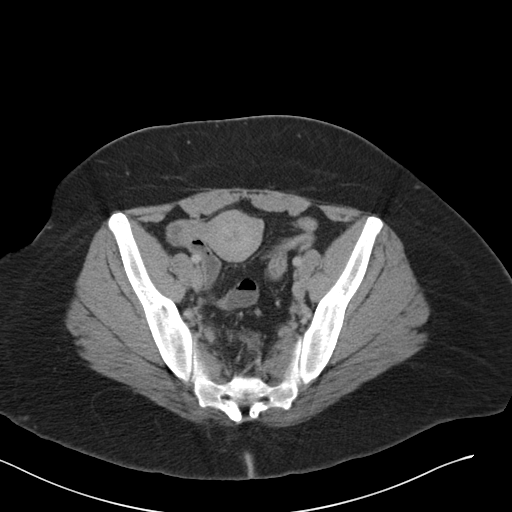
[im 38/94  soft-tissue]
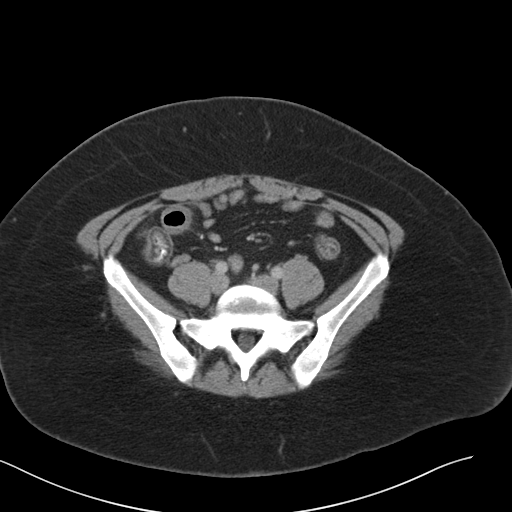
[im 42/94  soft-tissue]
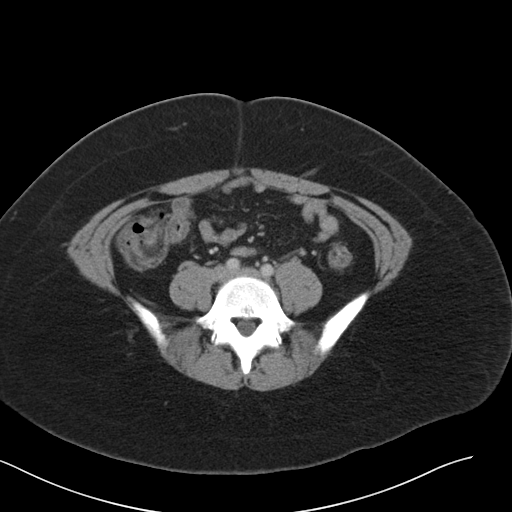
[im 52/94  soft-tissue]
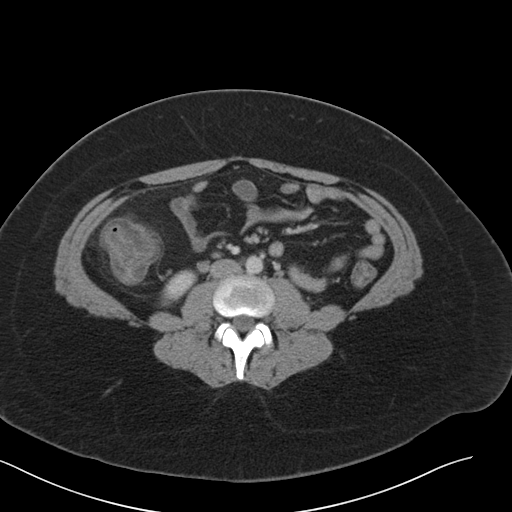
[im 56/94  soft-tissue]
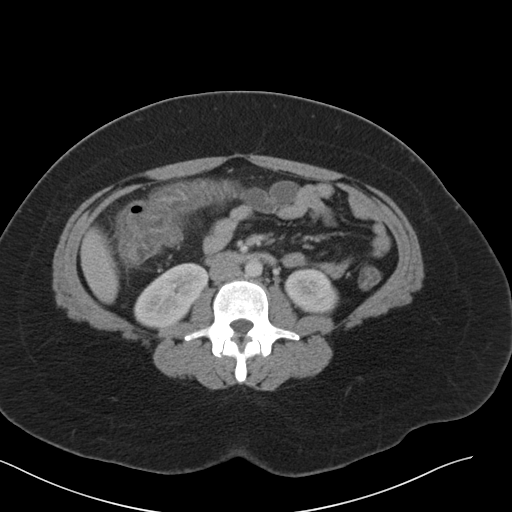
[im 66/94  soft-tissue]
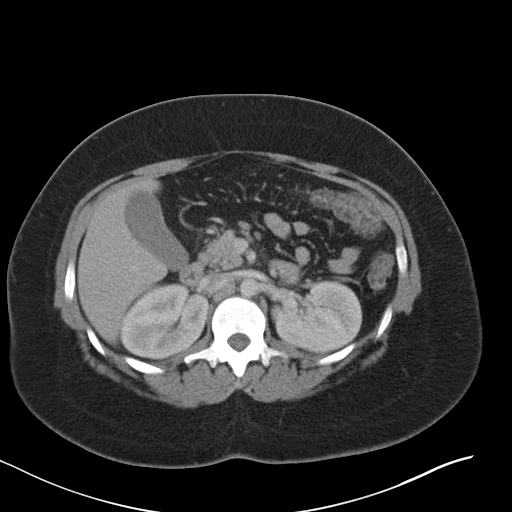
[im 66/94  bone]
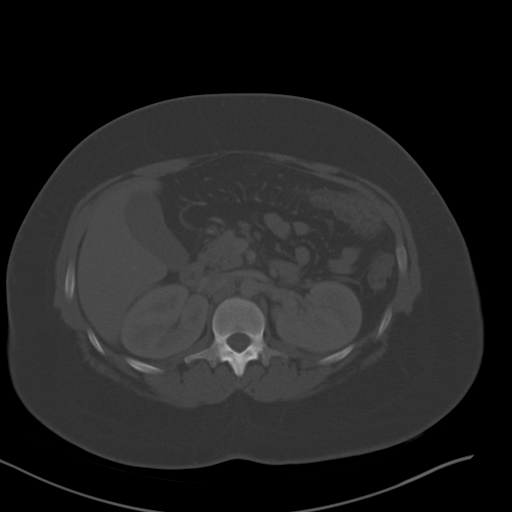
[im 75/94  soft-tissue]
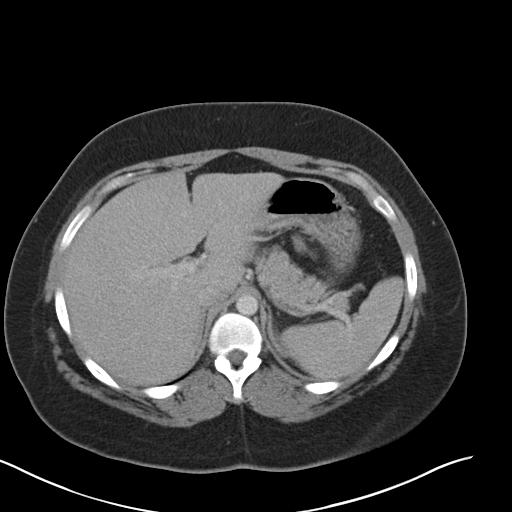
[im 80/94  soft-tissue]
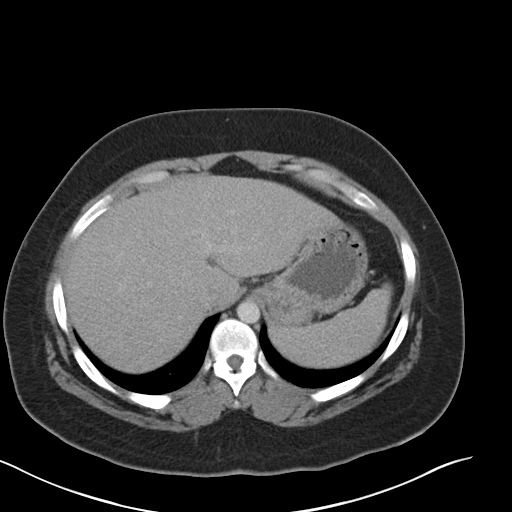
[im 89/94  soft-tissue]
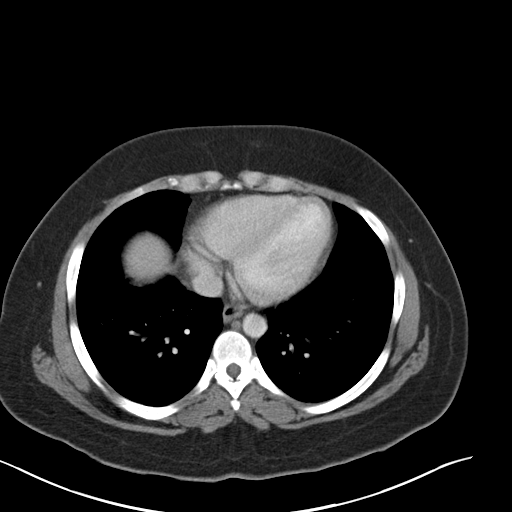

[Series 5: coronal a/|p · coronal · 0.79mm/px · 3 of 163 slices shown]
[im 55/163  soft-tissue]
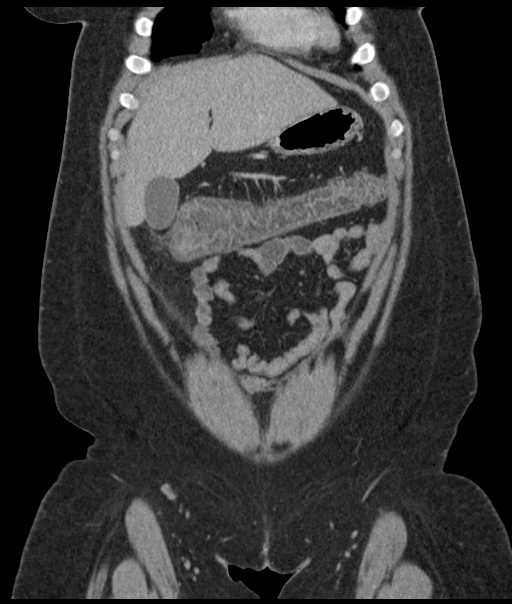
[im 73/163  soft-tissue]
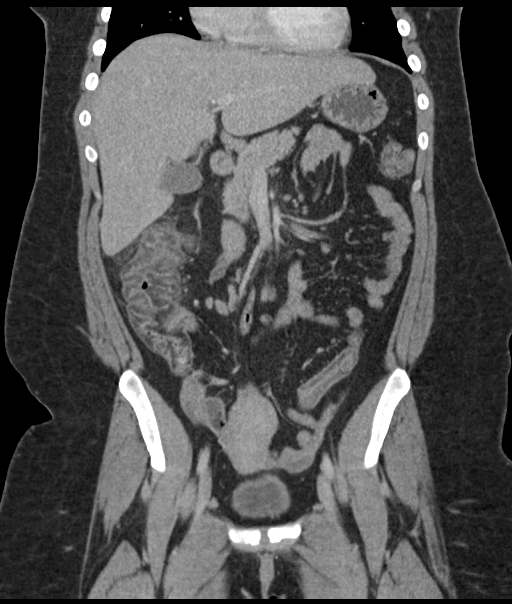
[im 91/163  soft-tissue]
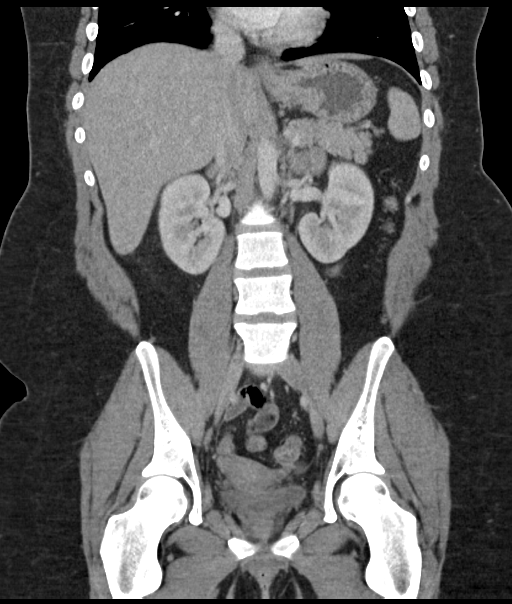

[15 of 46 positions shown; findings below may reference images not displayed]

FINDINGS: Lower chest: There is slight bibasilar atelectasis. Lung bases
elsewhere clear. There is a small hiatal hernia.

Hepatobiliary: There is a slight degree of fatty infiltration near
the fissure for the ligamentum teres. Beyond this slight degree of
fatty infiltration, no focal liver lesions are evident. Gallbladder
wall is not appreciably thickened. There is no biliary duct
dilatation.

Pancreas: No pancreatic mass or inflammatory focus.

Spleen: No splenic lesions are evident.

Adrenals/Urinary Tract: Adrenals appear normal bilaterally. Kidneys
bilaterally show no mass or hydronephrosis on either side. There is
no renal or ureteral calculus on either side. Urinary bladder is
midline with wall thickness within normal limits.

Stomach/Bowel: There is mesenteric stranding surrounding much of the
ascending colon extending to the level of the hepatic flexure. There
is no appreciable diverticular disease in this area. The wall of the
colon in this area does not appear appreciably thickened. Elsewhere,
there is no appreciable bowel wall or mesenteric thickening. The
colon is largely decompressed. There is no evident bowel
obstruction. No free air or portal venous air.

Vascular/Lymphatic: There is no abdominal aortic aneurysm. No
vascular lesions are evident. There is no evident adenopathy in the
abdomen or pelvis.

Reproductive: Uterus is anteverted. There is a cystic area in the
left ovary measuring 2.4 x 1.9 cm, a probable dominant physiologic
follicle. There is no other pelvic mass. No pelvic fluid collection.

Other: Appendix appears normal. No abscess or ascites evident in the
abdomen or pelvis. There is a small ventral hernia containing only
fat.

Musculoskeletal: There are no blastic or lytic bone lesions. There
is no intramuscular or abdominal wall lesion evident.
IMPRESSION: Evidence of mesenteric thickening surrounding portions of the
ascending colon and hepatic flexure, felt to represent colitis of
uncertain etiology. No associated bowel wall thickening. No
perforation or diverticular disease in this area.

No abscess. No bowel obstruction. Appendix appears normal. There is
a small hiatal hernia. There is a small ventral hernia containing
only fat.

There is no renal or ureteral calculus.  No hydronephrosis.

Probable dominant follicle left ovary.

## 2018-02-09 ENCOUNTER — Emergency Department (HOSPITAL_COMMUNITY): Payer: Self-pay

## 2018-02-09 ENCOUNTER — Emergency Department (HOSPITAL_COMMUNITY)
Admission: EM | Admit: 2018-02-09 | Discharge: 2018-02-09 | Disposition: A | Discharge status: Home / Self Care | Payer: Self-pay | Attending: Emergency Medicine | Admitting: Emergency Medicine

## 2018-02-09 ENCOUNTER — Encounter (HOSPITAL_COMMUNITY): Payer: Self-pay | Admitting: Emergency Medicine

## 2018-02-09 DIAGNOSIS — J45909 Unspecified asthma, uncomplicated: Secondary | ICD-10-CM | POA: Insufficient documentation

## 2018-02-09 DIAGNOSIS — F17210 Nicotine dependence, cigarettes, uncomplicated: Secondary | ICD-10-CM | POA: Insufficient documentation

## 2018-02-09 DIAGNOSIS — K859 Acute pancreatitis without necrosis or infection, unspecified: Secondary | ICD-10-CM | POA: Insufficient documentation

## 2018-02-09 DIAGNOSIS — F1010 Alcohol abuse, uncomplicated: Secondary | ICD-10-CM | POA: Insufficient documentation

## 2018-02-09 LAB — URINALYSIS, ROUTINE W REFLEX MICROSCOPIC
Bilirubin Urine: NEGATIVE
Glucose, UA: NEGATIVE mg/dL
Hgb urine dipstick: NEGATIVE
Ketones, ur: 20 mg/dL — AB
Nitrite: NEGATIVE
PROTEIN: 100 mg/dL — AB
Specific Gravity, Urine: 1.02 (ref 1.005–1.030)
Squamous Epithelial / HPF: >50 — ABNORMAL HIGH (ref 0–5)
pH: 8 (ref 5.0–8.0)

## 2018-02-09 LAB — COMPREHENSIVE METABOLIC PANEL
ALBUMIN: 4.7 g/dL (ref 3.5–5.0)
ALT: 18 U/L (ref 0–44)
AST: 26 U/L (ref 15–41)
Alkaline Phosphatase: 51 U/L (ref 38–126)
Anion gap: 14 (ref 5–15)
BUN: 7 mg/dL (ref 6–20)
CO2: 20 mmol/L — ABNORMAL LOW (ref 22–32)
Calcium: 9.8 mg/dL (ref 8.9–10.3)
Chloride: 101 mmol/L (ref 98–111)
Creatinine, Ser: 0.64 mg/dL (ref 0.44–1.00)
GFR calc Af Amer: >60 mL/min (ref >60)
GFR calc non Af Amer: >60 mL/min (ref >60)
Glucose, Bld: 132 mg/dL — ABNORMAL HIGH (ref 70–99)
Potassium: 3.8 mmol/L (ref 3.5–5.1)
SODIUM: 135 mmol/L (ref 135–145)
Total Bilirubin: 1 mg/dL (ref 0.3–1.2)
Total Protein: 8.6 g/dL — ABNORMAL HIGH (ref 6.5–8.1)

## 2018-02-09 LAB — LIPASE, BLOOD: Lipase: 312 U/L — ABNORMAL HIGH (ref 11–51)

## 2018-02-09 LAB — CBC
HEMATOCRIT: 37.6 % (ref 36.0–46.0)
Hemoglobin: 12.9 g/dL (ref 12.0–15.0)
MCH: 26.3 pg (ref 26.0–34.0)
MCHC: 34.3 g/dL (ref 30.0–36.0)
MCV: 76.6 fL — ABNORMAL LOW (ref 80.0–100.0)
Platelets: 241 10*3/uL (ref 150–400)
RBC: 4.91 MIL/uL (ref 3.87–5.11)
RDW: 13.2 % (ref 11.5–15.5)
WBC: 10.8 10*3/uL — ABNORMAL HIGH (ref 4.0–10.5)
nRBC: 0.3 % — ABNORMAL HIGH (ref 0.0–0.2)

## 2018-02-09 LAB — I-STAT BETA HCG BLOOD, ED (MC, WL, AP ONLY)

## 2018-02-09 MED ORDER — HYDROMORPHONE HCL 1 MG/ML IJ SOLN
1.0000 mg | Freq: Once | INTRAMUSCULAR | Status: AC
  Administered 2018-02-09: 1 mg via INTRAVENOUS
  Filled 2018-02-09: qty 1

## 2018-02-09 MED ORDER — SODIUM CHLORIDE 0.9 % IV BOLUS
1000.0000 mL | Freq: Once | INTRAVENOUS | Status: AC
  Administered 2018-02-09: 1000 mL via INTRAVENOUS

## 2018-02-09 MED ORDER — OXYCODONE-ACETAMINOPHEN 5-325 MG PO TABS
1.0000 | ORAL_TABLET | Freq: Once | ORAL | Status: AC
  Administered 2018-02-09: 1 via ORAL
  Filled 2018-02-09: qty 1

## 2018-02-09 MED ORDER — OXYCODONE-ACETAMINOPHEN 5-325 MG PO TABS: 1.0000 | ORAL_TABLET | Freq: Four times a day (QID) | ORAL | 0 refills | Status: AC | PRN

## 2018-02-09 MED ORDER — ONDANSETRON HCL 4 MG/2ML IJ SOLN
4.0000 mg | Freq: Once | INTRAMUSCULAR | Status: AC
  Administered 2018-02-09: 4 mg via INTRAVENOUS
  Filled 2018-02-09: qty 2

## 2018-02-09 MED ORDER — SODIUM CHLORIDE 0.9% FLUSH: 3.0000 mL | Freq: Once | INTRAVENOUS | Status: DC

## 2018-02-09 MED ORDER — ONDANSETRON 4 MG PO TBDP: 4.0000 mg | ORAL_TABLET | Freq: Three times a day (TID) | ORAL | 0 refills | Status: DC | PRN

## 2018-02-09 NOTE — ED Triage Notes (Signed)
Patient here from home with complaints of abd pain, n/v. Reports 2 beers in the past 3 days. States that she had similar symptoms a couple of years ago and was diagnosed with pancreatitis.

## 2018-02-09 NOTE — ED Notes (Signed)
Patient given apple juice

## 2018-02-09 NOTE — ED Provider Notes (Signed)
[facility]-EMERGENCY DEPT Provider Note   CSN: [id number] Arrival date & time: 02/09/18  1126     History   Chief Complaint Chief Complaint  Patient presents with  . Nausea  . Emesis    HPI Hannah Hardy is a 28 y.o. female with past medical history of asthma, cholelithiasis, uncomplicated pancreatitis, presenting to the emergency department with complaint of 3 days of worsening epigastric abdominal pain.  Pain is sharp, constant, radiating to her back.  Feels very similar to her previous episode of pancreatitis.  Patient states she had "a couple of beers" 3 days ago which preceded onset of symptoms.  Denies any other alcohol use.  Has associated nausea and vomiting.  Last BM was 2 days ago states it was somewhat hard. Patient reports the past few weeks she has been drinking alcohol frequently, at least 4 days out of the week.  States she has 4-6 drinks per day.  When she began having the abdominal discomfort she stopped drinking.  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    Per chart review, patient with past history of uncomplicated pancreatitis in November 2017.  Patient was admitted and discharged days later with symptomatic management.    The history is provided by the patient and medical records.    Past Medical History:  Diagnosis Date  . Asthma     Patient Active Problem List   Diagnosis Date Noted  . Hypokalemia 02/12/2016  . Elevated lipase 02/12/2016  . Enteritis 02/12/2016  . Dehydration 02/12/2016  . Leukocytosis 02/12/2016  . Obesity 02/12/2016  . Asthma 02/12/2016  . Cholelithiasis 11/26/2015  . Alcohol abuse 11/26/2015  . Acute pancreatitis without infection or necrosis 11/25/2015    History reviewed. No pertinent surgical history.   OB History   No obstetric history on file.      Home Medications     Prior to Admission medications   Medication Sig Start Date End Date Taking? Authorizing Provider  acetaminophen (TYLENOL) 500 MG tablet Take 1,000 mg by mouth every 6 (six) hours as needed for mild pain.   Yes [provider]  albuterol (PROVENTIL HFA;VENTOLIN HFA) 108 (90 Base) MCG/ACT inhaler Inhale 1-2 puffs into the lungs every 6 (six) hours as needed for wheezing or shortness of breath.   Yes [provider]  ondansetron (ZOFRAN ODT) 4 MG disintegrating tablet Take 1 tablet (4 mg total) by mouth every 8 (eight) hours as needed for nausea or vomiting. 02/09/18   Vela Render, [place] N, PA-C  oxyCODONE-acetaminophen (PERCOCET/ROXICET) 5-325 MG tablet Take 1-2 tablets by mouth every 6 (six) hours as needed for up to 5 days for severe pain. 02/09/18 02/14/18  Willo Yoon, [place] N, PA-C    Family History No family history on file.  Social History Social History   Tobacco Use  . Smoking status: Current Every Day Smoker    Packs/day: 1.00    Types: Cigarettes  . Smokeless tobacco: Never Used  Substance Use Topics  . Alcohol use: Yes    Comment: occ  . Drug use: Yes    Types: Marijuana    Comment: Quit marijuana     Allergies   Patient has no known allergies.   Review of Systems Review of Systems  Gastrointestinal: Positive for abdominal pain, nausea and vomiting.  All other systems reviewed and are negative.    Physical  Exam Updated Vital Signs BP (!) 101/54   Pulse (!) 52   Temp 98.5 F (36.9 C) (Oral)   Resp 14   SpO2 96%   Physical Exam Vitals signs and nursing note reviewed.  Constitutional:      Appearance: She is well-developed. She is obese.  HENT:     Head: Normocephalic and atraumatic.     Mouth/Throat:     Mouth: Mucous membranes are moist.  Eyes:     Conjunctiva/sclera: Conjunctivae normal.  Cardiovascular:     Rate and Rhythm: Normal rate and regular rhythm.  Pulmonary:     Effort: Pulmonary effort is normal.     Breath sounds:  Normal breath sounds.  Abdominal:     General: Bowel sounds are normal. There is no distension.     Palpations: Abdomen is soft. There is no mass.     Tenderness: There is abdominal tenderness in the right upper quadrant, epigastric area and left upper quadrant. There is no guarding or rebound.  Skin:    General: Skin is warm.  Neurological:     Mental Status: She is alert.  Psychiatric:        Behavior: Behavior normal.      ED Treatments / Results  Labs (all labs ordered are listed, but only abnormal results are displayed) Labs Reviewed  LIPASE, BLOOD - Abnormal; Notable for the following components:      Result Value   Lipase 312 (*)    All other components within normal limits  COMPREHENSIVE METABOLIC PANEL - Abnormal; Notable for the following components:   CO2 20 (*)    Glucose, Bld 132 (*)    Total Protein 8.6 (*)    All other components within normal limits  CBC - Abnormal; Notable for the following components:   WBC 10.8 (*)    MCV 76.6 (*)    nRBC 0.3 (*)    All other components within normal limits  URINALYSIS, ROUTINE W REFLEX MICROSCOPIC  I-STAT BETA HCG BLOOD, ED ([facility], [facility], [facility] ONLY)    EKG None  Radiology [place] Abdomen Limited Ruq  Result Date: 02/09/2018 CLINICAL DATA:  Pancreatitis. EXAM: ULTRASOUND ABDOMEN LIMITED RIGHT UPPER QUADRANT COMPARISON:  None FINDINGS: Gallbladder: No gallstones or wall thickening visualized. No sonographic Murphy sign noted by sonographer. Common bile duct: Diameter: 4.6 mm Liver: No focal lesion identified. Within normal limits in parenchymal echogenicity. Portal vein is patent on color Doppler imaging with normal direction of blood flow towards the liver. IMPRESSION: 1. Normal exam. Electronically Signed   By: Signa Kellaylor  Stroud M.D.   On: 02/09/2018 16:31    Procedures Procedures (including critical care time)  Medications Ordered in ED Medications  sodium chloride flush (NS) 0.9 % injection 3 mL (3 mLs Intravenous Not Given  02/09/18 1402)  ondansetron (ZOFRAN) injection 4 mg (4 mg Intravenous Given 02/09/18 1411)  sodium chloride 0.9 % bolus 1,000 mL (0 mLs Intravenous Stopped 02/09/18 1611)  HYDROmorphone (DILAUDID) injection 1 mg (1 mg Intravenous Given 02/09/18 1411)  HYDROmorphone (DILAUDID) injection 1 mg (1 mg Intravenous Given 02/09/18 1643)  oxyCODONE-acetaminophen (PERCOCET/ROXICET) 5-325 MG per tablet 1 tablet (1 tablet Oral Given 02/09/18 1644)     Initial Impression / Assessment and Plan / ED Course  I have reviewed the triage vital signs and the nursing notes.  Pertinent labs & imaging results that were available during my care of the patient were reviewed by me and considered in my medical decision making (see chart for details).  Clinical Course as of Feb 10 1855  Sat Feb 09, 2018  1636 Pt re-evaluated, states pain returned. Will redose IV pain medication and give PO, will re-eval.   [JR]  1815 Patient reevaluated, reports improvement in symptoms.  States she feels well for discharge.   [JR]  1815 Tolerating p.o., ready for discharge.   [JR]    Clinical Course User Index [JR] Delaney Perona, [place] N, PA-C    Patient presenting with epigastric abdominal pain and nausea and vomiting after recent increase in alcohol ingestion for the past few weeks.  History of uncomplicated pancreatitis, symptoms similar today.  No fever.  Abdomen with tenderness to the upper quadrants and epigastrium, no guarding or rebound.  Heart and lung sounds are normal.  Labs insistent with acute pancreatitis with an elevated lipase of 312.  Very mild leukocytosis 10.8.  Labs are otherwise reassuring.  Negative hCG.  Right upper quadrant ultrasound ordered due to patient's documented history of cholelithiasis, and is negative for choledocholithiasis.  Pain treated in the ED, IV fluids.  Pain managed and patient tolerating p.o. fluids and is agreeable to discharge with symptomatic management at home.  Instructed diet modification and  return precautions.  Agreeable to plan and safe for discharge.  Discussed results, findings, treatment and follow up. Patient advised of return precautions. Patient verbalized understanding and agreed with plan.  Final Clinical Impressions(s) / ED Diagnoses   Final diagnoses:  Pancreatitis    ED Discharge Orders         Ordered    oxyCODONE-acetaminophen (PERCOCET/ROXICET) 5-325 MG tablet  Every 6 hours PRN     02/09/18 1855    ondansetron (ZOFRAN ODT) 4 MG disintegrating tablet  Every 8 hours PRN     02/09/18 1855           Hannah Hardy, [place] N, PA-C 02/09/18 1900    Azalia Bilisampos, Kevin, MD 02/10/18 [phone]

## 2018-02-09 NOTE — ED Notes (Addendum)
Asked pt if she could provide a urine sample. Pt stated she tried but is unable to at this time. RN made aware.

## 2018-02-09 NOTE — Discharge Instructions (Addendum)
Please read instructions below. Avoid alcohol. Drink clear liquids for at least 24 hours. Then, slowly introduce bland foods into your diet as tolerated, low fat diet is important.  If your abdominal pain worsens, go back to clear liquids for another 24 hours and slowly advance diet as tolerated. You can take zofran every 8 hours as needed for nausea. You can take oxycodone every 4-6 hours as needed for severe pain.  Do not take Tylenol with this medication.  Do not drive or drink alcohol with this medication. Follow up with your primary care. Return to the ER for severely worsening abdominal pain, fever, uncontrollable vomiting, or new or concerning symptoms.

## 2018-02-09 NOTE — ED Notes (Signed)
US at bedside

## 2018-06-01 ENCOUNTER — Emergency Department (HOSPITAL_COMMUNITY)
Admission: EM | Admit: 2018-06-01 | Discharge: 2018-06-01 | Disposition: A | Discharge status: Home / Self Care | Payer: Self-pay | Attending: Emergency Medicine | Admitting: Emergency Medicine

## 2018-06-01 ENCOUNTER — Encounter (HOSPITAL_COMMUNITY): Payer: Self-pay | Admitting: *Deleted

## 2018-06-01 ENCOUNTER — Other Ambulatory Visit: Payer: Self-pay

## 2018-06-01 ENCOUNTER — Emergency Department (HOSPITAL_COMMUNITY): Payer: Self-pay

## 2018-06-01 DIAGNOSIS — J45909 Unspecified asthma, uncomplicated: Secondary | ICD-10-CM | POA: Insufficient documentation

## 2018-06-01 DIAGNOSIS — F1910 Other psychoactive substance abuse, uncomplicated: Secondary | ICD-10-CM | POA: Insufficient documentation

## 2018-06-01 DIAGNOSIS — F17210 Nicotine dependence, cigarettes, uncomplicated: Secondary | ICD-10-CM | POA: Insufficient documentation

## 2018-06-01 DIAGNOSIS — K852 Alcohol induced acute pancreatitis without necrosis or infection: Secondary | ICD-10-CM | POA: Insufficient documentation

## 2018-06-01 HISTORY — DX: Acute pancreatitis without necrosis or infection, unspecified: K85.90

## 2018-06-01 LAB — COMPREHENSIVE METABOLIC PANEL
ALT: 21 U/L (ref 0–44)
AST: 45 U/L — ABNORMAL HIGH (ref 15–41)
Albumin: 4.2 g/dL (ref 3.5–5.0)
Alkaline Phosphatase: 55 U/L (ref 38–126)
Anion gap: 13 (ref 5–15)
BUN: 6 mg/dL (ref 6–20)
CO2: 22 mmol/L (ref 22–32)
Calcium: 9.1 mg/dL (ref 8.9–10.3)
Chloride: 103 mmol/L (ref 98–111)
Creatinine, Ser: 0.71 mg/dL (ref 0.44–1.00)
GFR calc Af Amer: >60 mL/min (ref >60)
GFR calc non Af Amer: >60 mL/min (ref >60)
Glucose, Bld: 108 mg/dL — ABNORMAL HIGH (ref 70–99)
Potassium: 4.3 mmol/L (ref 3.5–5.1)
Sodium: 138 mmol/L (ref 135–145)
Total Bilirubin: 0.8 mg/dL (ref 0.3–1.2)
Total Protein: 8.2 g/dL — ABNORMAL HIGH (ref 6.5–8.1)

## 2018-06-01 LAB — URINALYSIS, ROUTINE W REFLEX MICROSCOPIC
Bilirubin Urine: NEGATIVE
Glucose, UA: NEGATIVE mg/dL
Hgb urine dipstick: NEGATIVE
Ketones, ur: 5 mg/dL — AB
Nitrite: NEGATIVE
Protein, ur: 100 mg/dL — AB
Specific Gravity, Urine: 1.019 (ref 1.005–1.030)
pH: 6 (ref 5.0–8.0)

## 2018-06-01 LAB — CBC WITH DIFFERENTIAL/PLATELET
Abs Immature Granulocytes: 0.01 10*3/uL (ref 0.00–0.07)
Basophils Absolute: 0 10*3/uL (ref 0.0–0.1)
Basophils Relative: 0 %
Eosinophils Absolute: 0.3 10*3/uL (ref 0.0–0.5)
Eosinophils Relative: 4 %
HCT: 34.7 % — ABNORMAL LOW (ref 36.0–46.0)
Hemoglobin: 12 g/dL (ref 12.0–15.0)
Immature Granulocytes: 0 %
Lymphocytes Relative: 37 %
Lymphs Abs: 3 10*3/uL (ref 0.7–4.0)
MCH: 26.1 pg (ref 26.0–34.0)
MCHC: 34.6 g/dL (ref 30.0–36.0)
MCV: 75.4 fL — ABNORMAL LOW (ref 80.0–100.0)
Monocytes Absolute: 0.8 10*3/uL (ref 0.1–1.0)
Monocytes Relative: 10 %
Neutro Abs: 3.9 10*3/uL (ref 1.7–7.7)
Neutrophils Relative %: 49 %
Platelets: 241 10*3/uL (ref 150–400)
RBC: 4.6 MIL/uL (ref 3.87–5.11)
RDW: 13.5 % (ref 11.5–15.5)
WBC: 8.1 10*3/uL (ref 4.0–10.5)
nRBC: 0.2 % (ref 0.0–0.2)

## 2018-06-01 LAB — RAPID URINE DRUG SCREEN, HOSP PERFORMED
Amphetamines: POSITIVE — AB
Barbiturates: NOT DETECTED
Benzodiazepines: NOT DETECTED
Cocaine: POSITIVE — AB
Opiates: POSITIVE — AB
Tetrahydrocannabinol: POSITIVE — AB

## 2018-06-01 LAB — I-STAT BETA HCG BLOOD, ED (MC, WL, AP ONLY): I-stat hCG, quantitative: <5 m[IU]/mL (ref <5)

## 2018-06-01 LAB — LIPASE, BLOOD: Lipase: 295 U/L — ABNORMAL HIGH (ref 11–51)

## 2018-06-01 MED ORDER — FENTANYL CITRATE (PF) 100 MCG/2ML IJ SOLN
100.0000 ug | Freq: Once | INTRAMUSCULAR | Status: AC
  Administered 2018-06-01: 100 ug via INTRAVENOUS
  Filled 2018-06-01: qty 2

## 2018-06-01 MED ORDER — TRAMADOL HCL 50 MG PO TABS: 50.0000 mg | ORAL_TABLET | Freq: Four times a day (QID) | ORAL | 0 refills | Status: DC | PRN

## 2018-06-01 MED ORDER — SODIUM CHLORIDE 0.9 % IV BOLUS
1000.0000 mL | Freq: Once | INTRAVENOUS | Status: AC
  Administered 2018-06-01: 1000 mL via INTRAVENOUS

## 2018-06-01 MED ORDER — MORPHINE SULFATE (PF) 4 MG/ML IV SOLN
6.0000 mg | Freq: Once | INTRAVENOUS | Status: AC
  Administered 2018-06-01: 6 mg via INTRAVENOUS
  Filled 2018-06-01: qty 2

## 2018-06-01 MED ORDER — OMEPRAZOLE 40 MG PO CPDR: 40.0000 mg | DELAYED_RELEASE_CAPSULE | Freq: Every day | ORAL | 0 refills | Status: DC

## 2018-06-01 MED ORDER — ONDANSETRON 4 MG PO TBDP: 4.0000 mg | ORAL_TABLET | Freq: Three times a day (TID) | ORAL | 0 refills | Status: DC | PRN

## 2018-06-01 MED ORDER — OXYCODONE-ACETAMINOPHEN 5-325 MG PO TABS
1.0000 | ORAL_TABLET | Freq: Once | ORAL | Status: AC
  Administered 2018-06-01: 1 via ORAL
  Filled 2018-06-01: qty 1

## 2018-06-01 MED ORDER — IOHEXOL 300 MG/ML  SOLN
100.0000 mL | Freq: Once | INTRAMUSCULAR | Status: AC | PRN
  Administered 2018-06-01: 11:00:00 100 mL via INTRAVENOUS

## 2018-06-01 MED ORDER — ONDANSETRON HCL 4 MG/2ML IJ SOLN
4.0000 mg | Freq: Once | INTRAMUSCULAR | Status: AC
  Administered 2018-06-01: 08:00:00 4 mg via INTRAVENOUS
  Filled 2018-06-01: qty 2

## 2018-06-01 NOTE — ED Notes (Signed)
Patient verbalizing pain in abdomen. PA made aware.

## 2018-06-01 NOTE — Discharge Instructions (Signed)
You were seen in the ED for nausea, vomiting, abdominal pain.   You have pancreatitis. This is likely from daily alcohol use.   Take 1000 mg tylenol for pain every 8 hours.  Take tramadol 50 mg every 6 hours for break through or severe pain.  Take ondansetron every 8 hours for nausea.    Clear liquid diet for the next 48 hours, if symptoms improve slowly transition to mild low fat/spicy diet slowly.   Your urine tested positive for cocaine, amphetamines and marijuana.  Stop using these illicit drugs.  This predisposes you to continued nausea, vomiting which will worsen pancreatitis.   Return for worsening symptoms despite medicines, fever greater than 100, blood in vomit, chest pain or shortness of breath.

## 2018-06-01 NOTE — ED Provider Notes (Signed)
[facility]-EMERGENCY DEPT Provider Note   CSN: [id number] Arrival date & time: 06/01/18  0737    History   Chief Complaint Chief Complaint  Patient presents with  . Nausea    abdominal pain vomiting     HPI Hannah Hardy is a 28 y.o. female with history of pancreatitis, asthma is here for evaluation of "chest pain", she points to her epigastrium.  Onset 3 days ago, severe, constant.  Associated with nausea, nonbilious nonbloody emesis, chills.  Epigastric abdominal pain is worse with active vomiting, described as burning, radiating to the middle of her back.  States it feels similar to her previous episode of pancreatitis.  Has not taken anything for her symptoms.  Has not been able to tolerate fluids due to the nausea and vomiting.  Denies recent exposure to sick contacts, suspicious foods, travel, antibiotics.  She drinks 2-3 EtOH drinks nightly, last use last night.  +tobacco use.  Denies frequent NSAID use, history of PUD, GI bleeds.  Last marijuana use 2 to 3 weeks ago.  Denies fevers, runny nose, cough, chest pain or shortness of breath with exertion, coffee-ground emesis, hematemesis, diarrhea, urinary symptoms.  No distal paresthesias, numbness.     HPI  Past Medical History:  Diagnosis Date  . Asthma   . Pancreatitis     Patient Active Problem List   Diagnosis Date Noted  . Hypokalemia 02/12/2016  . Elevated lipase 02/12/2016  . Enteritis 02/12/2016  . Dehydration 02/12/2016  . Leukocytosis 02/12/2016  . Obesity 02/12/2016  . Asthma 02/12/2016  . Cholelithiasis 11/26/2015  . Alcohol abuse 11/26/2015  . Acute pancreatitis without infection or necrosis 11/25/2015    History reviewed. No pertinent surgical history.   OB History   No obstetric history on file.      Home Medications    Prior to Admission medications   Medication Sig Start Date End Date Taking? Authorizing Provider  acetaminophen (TYLENOL) 500 MG tablet Take 1,000 mg  by mouth every 6 (six) hours as needed for mild pain.   Yes [provider]  albuterol (PROVENTIL HFA;VENTOLIN HFA) 108 (90 Base) MCG/ACT inhaler Inhale 1-2 puffs into the lungs every 6 (six) hours as needed for wheezing or shortness of breath.   Yes [provider]  omeprazole (PRILOSEC) 40 MG capsule Take 1 capsule (40 mg total) by mouth daily for 30 days. 06/01/18 07/01/18  Liberty Handy, PA-C  ondansetron (ZOFRAN ODT) 4 MG disintegrating tablet Take 1 tablet (4 mg total) by mouth every 8 (eight) hours as needed for nausea or vomiting. 06/01/18   Liberty Handy, PA-C  traMADol (ULTRAM) 50 MG tablet Take 1 tablet (50 mg total) by mouth every 6 (six) hours as needed. 06/01/18   Liberty Handy, PA-C    Family History No family history on file.  Social History Social History   Tobacco Use  . Smoking status: Current Every Day Smoker    Packs/day: 1.00    Types: Cigarettes  . Smokeless tobacco: Never Used  Substance Use Topics  . Alcohol use: Yes    Comment: occ  . Drug use: Yes    Types: Marijuana    Comment: Quit marijuana     Allergies   Patient has no known allergies.   Review of Systems Review of Systems  Constitutional: Positive for chills.  Cardiovascular: Positive for chest pain.  Gastrointestinal: Positive for abdominal pain, nausea and vomiting.  All other systems reviewed and are negative.  Physical Exam Updated Vital Signs BP 140/83 (BP Location: Right Arm)   Pulse 81   Temp 97.7 F (36.5 C) (Oral)   Resp 20   SpO2 97%   Physical Exam Vitals signs and nursing note reviewed.  Constitutional:      Appearance: She is well-developed.     Comments: Non toxic in NAD  HENT:     Head: Normocephalic and atraumatic.     Nose: Nose normal.  Eyes:     Conjunctiva/sclera: Conjunctivae normal.  Neck:     Musculoskeletal: Normal range of motion.  Cardiovascular:     Rate and Rhythm: Normal rate and regular rhythm.     Comments: 1+  radial and DP pulses bilaterally.  No lower extremity edema.  No calf tenderness. Pulmonary:     Effort: Pulmonary effort is normal.     Breath sounds: Normal breath sounds.  Chest:     Comments: No sternal/chest wall tenderness. Abdominal:     General: Bowel sounds are normal.     Palpations: Abdomen is soft.     Tenderness: There is abdominal tenderness in the epigastric area. There is guarding.     Comments: No rigidity, distention. No suprapubic or CVA tenderness. Negative Murphy's and McBurney's. Active BS to lower quadrants.   Musculoskeletal: Normal range of motion.  Skin:    General: Skin is warm and dry.     Capillary Refill: Capillary refill takes less than 2 seconds.  Neurological:     Mental Status: She is alert.     Comments: Sensation and strength intact in upper and lower extremities.  Psychiatric:        Behavior: Behavior normal.      ED Treatments / Results  Labs (all labs ordered are listed, but only abnormal results are displayed) Labs Reviewed  CBC WITH DIFFERENTIAL/PLATELET - Abnormal; Notable for the following components:      Result Value   HCT 34.7 (*)    MCV 75.4 (*)    All other components within normal limits  COMPREHENSIVE METABOLIC PANEL - Abnormal; Notable for the following components:   Glucose, Bld 108 (*)    Total Protein 8.2 (*)    AST 45 (*)    All other components within normal limits  LIPASE, BLOOD - Abnormal; Notable for the following components:   Lipase 295 (*)    All other components within normal limits  URINALYSIS, ROUTINE W REFLEX MICROSCOPIC - Abnormal; Notable for the following components:   APPearance CLOUDY (*)    Ketones, ur 5 (*)    Protein, ur 100 (*)    Leukocytes,Ua TRACE (*)    Bacteria, UA RARE (*)    All other components within normal limits  RAPID URINE DRUG SCREEN, HOSP PERFORMED - Abnormal; Notable for the following components:   Opiates POSITIVE (*)    Cocaine POSITIVE (*)    Amphetamines POSITIVE (*)     Tetrahydrocannabinol POSITIVE (*)    All other components within normal limits  I-STAT BETA HCG BLOOD, ED ([facility], [facility], [facility] ONLY)    EKG None  Radiology Ct Abdomen Pelvis W Contrast  Result Date: 06/01/2018 CLINICAL DATA:  History of pancreatitis. Abdominal pain, nausea, and vomiting for 3 days. EXAM: CT ABDOMEN AND PELVIS WITH CONTRAST TECHNIQUE: Multidetector CT imaging of the abdomen and pelvis was performed using the standard protocol following bolus administration of intravenous contrast. CONTRAST:  100mL OMNIPAQUE IOHEXOL 300 MG/ML  SOLN COMPARISON:  February 12, 2016 FINDINGS: Lower chest: No acute  abnormality. Hepatobiliary: No focal liver abnormality is seen. No gallstones, gallbladder wall thickening, or biliary dilatation. Pancreas: There is fat stranding around the pancreatic uncinate process, head, and neck. The underlying parenchyma demonstrates no evidence of necrosis or mass. The remainder of the pancreas is normal. Spleen: Normal in size without focal abnormality. Adrenals/Urinary Tract: The stomach is normal. The second and third portions of the duodenum traversed the fat stranding adjacent to the pancreas. The remainder of the small bowel is normal. The colon and visualized portions of the appendix are normal. Stomach/Bowel: Stomach is within normal limits. Appendix appears normal. No evidence of bowel wall thickening, distention, or inflammatory changes. Vascular/Lymphatic: No significant vascular findings are present. No enlarged abdominal or pelvic lymph nodes. Reproductive: 2.3 cm simple cyst in left ovary of no acute significance. The uterus and adnexa are otherwise normal. Other: No abdominal wall hernia or abnormality. No abdominopelvic ascites. Musculoskeletal: No acute or significant osseous findings. IMPRESSION: 1. Pancreatitis. Electronically Signed   By: Gerome Sam III M.D   On: 06/01/2018 11:31    Procedures Procedures (including critical care time)  Medications Ordered  in ED Medications  ondansetron [facility]) injection 4 mg (4 mg Intravenous Given 06/01/18 0828)  morphine 4 MG/ML injection 6 mg (6 mg Intravenous Given 06/01/18 0829)  sodium chloride 0.9 % bolus 1,000 mL (0 mLs Intravenous Stopped 06/01/18 0944)  fentaNYL (SUBLIMAZE) injection 100 mcg (100 mcg Intravenous Given 06/01/18 1056)  iohexol (OMNIPAQUE) 300 MG/ML solution 100 mL (100 mLs Intravenous Contrast Given 06/01/18 1034)  oxyCODONE-acetaminophen (PERCOCET/ROXICET) 5-325 MG per tablet 1 tablet (1 tablet Oral Given 06/01/18 1333)     Initial Impression / Assessment and Plan / ED Course  I have reviewed the triage vital signs and the nursing notes.  Pertinent labs & imaging results that were available during my care of the patient were reviewed by me and considered in my medical decision making (see chart for details).  Clinical Course as of May 31 1337  Sat Jun 01, 2018  1001 Re-evaluated pt, she was asleep.  Reports aausea/vomiting resolved, persistent pain.  Lipase 295, will redose pain med, CTAP.   Lipase(!): 295 [CG]  1002 Opiates(!): POSITIVE [CG]  1055 COCAINE(!): POSITIVE [CG]  1055 Amphetamines(!): POSITIVE [CG]  1055 Tetrahydrocannabinol(!): POSITIVE [CG]    Clinical Course User Index [CG] Liberty Handy, PA-C       ddx includes viral process vs GERD/gastritis/PUD given frequent ETOH use, marijuana use hyperemesis syndrome vs pancreatitis vs cholecystitis vs biliary colic.  She has no frank CP/SOB on exertion and ACS considered less likely given history.  No distal paresthesias, pulse or neuro deficits, abdominal pulsatility and lower suspicion for dissection. Doubt SBO, diverticulitis, appendicitis given exam.  Will obtain labs, UA, symptom control, reassess.  Final Clinical Impressions(s) / ED Diagnoses   Work up as above with lipase 295 and CTAP confirming pancreatitis w/o other complications. WBC normal. LFTs normal. Pan positive UDS. Tolerated PO fluids here, improving  epigastric abd pain.  Difficult situation as pt has illicit drug use as evidenced by UDS however will give benefit of the doubt and give short prescription for tramadol to help with pain. Will dc with pain control, antiemetics, liquid diet. Return precautions given. Pt is comfortable with this.  Final diagnoses:  Alcohol-induced acute pancreatitis without infection or necrosis  Polysubstance abuse [facility])    ED Discharge Orders         Ordered    traMADol (ULTRAM) 50 MG tablet  Every 6  hours PRN     06/01/18 1336    ondansetron (ZOFRAN ODT) 4 MG disintegrating tablet  Every 8 hours PRN     06/01/18 1336    omeprazole (PRILOSEC) 40 MG capsule  Daily     06/01/18 1336           Jerrell Mylar 06/01/18 1339    Loren Racer, MD 06/01/18 1419

## 2018-06-01 NOTE — ED Notes (Signed)
Patient given sprite.

## 2018-06-01 NOTE — ED Notes (Signed)
Bed: DV76 Expected date:  Expected time:  Means of arrival:  Comments: 28 yo epigastric pain

## 2018-06-01 NOTE — ED Triage Notes (Addendum)
Patient has history of pancreatitis presents via EMS with abdominal pain, nausea and vomiting which started 3 days ago has gotten progressively worse over night. Patient  has history of ETOH abuse and noted to have a wet cough per EMS

## 2018-06-02 ENCOUNTER — Encounter (HOSPITAL_COMMUNITY): Payer: Self-pay | Admitting: Emergency Medicine

## 2018-06-02 ENCOUNTER — Other Ambulatory Visit: Payer: Self-pay

## 2018-06-02 ENCOUNTER — Emergency Department (HOSPITAL_COMMUNITY)
Admission: EM | Admit: 2018-06-02 | Discharge: 2018-06-02 | Disposition: A | Discharge status: Home / Self Care | Payer: Self-pay | Attending: Emergency Medicine | Admitting: Emergency Medicine

## 2018-06-02 DIAGNOSIS — F17210 Nicotine dependence, cigarettes, uncomplicated: Secondary | ICD-10-CM | POA: Insufficient documentation

## 2018-06-02 DIAGNOSIS — J45909 Unspecified asthma, uncomplicated: Secondary | ICD-10-CM | POA: Insufficient documentation

## 2018-06-02 DIAGNOSIS — K859 Acute pancreatitis without necrosis or infection, unspecified: Secondary | ICD-10-CM | POA: Insufficient documentation

## 2018-06-02 DIAGNOSIS — Z79899 Other long term (current) drug therapy: Secondary | ICD-10-CM | POA: Insufficient documentation

## 2018-06-02 LAB — COMPREHENSIVE METABOLIC PANEL
ALT: 20 U/L (ref 0–44)
AST: 36 U/L (ref 15–41)
Albumin: 4.4 g/dL (ref 3.5–5.0)
Alkaline Phosphatase: 56 U/L (ref 38–126)
Anion gap: 13 (ref 5–15)
BUN: 7 mg/dL (ref 6–20)
CO2: 21 mmol/L — ABNORMAL LOW (ref 22–32)
Calcium: 9.2 mg/dL (ref 8.9–10.3)
Chloride: 101 mmol/L (ref 98–111)
Creatinine, Ser: 0.65 mg/dL (ref 0.44–1.00)
GFR calc Af Amer: >60 mL/min (ref >60)
GFR calc non Af Amer: >60 mL/min (ref >60)
Glucose, Bld: 114 mg/dL — ABNORMAL HIGH (ref 70–99)
Potassium: 5.2 mmol/L — ABNORMAL HIGH (ref 3.5–5.1)
Sodium: 135 mmol/L (ref 135–145)
Total Bilirubin: 1 mg/dL (ref 0.3–1.2)
Total Protein: 8.4 g/dL — ABNORMAL HIGH (ref 6.5–8.1)

## 2018-06-02 LAB — CBC WITH DIFFERENTIAL/PLATELET
Abs Immature Granulocytes: 0.04 10*3/uL (ref 0.00–0.07)
Basophils Absolute: 0 10*3/uL (ref 0.0–0.1)
Basophils Relative: 0 %
Eosinophils Absolute: 0 10*3/uL (ref 0.0–0.5)
Eosinophils Relative: 0 %
HCT: 33.7 % — ABNORMAL LOW (ref 36.0–46.0)
Hemoglobin: 11.6 g/dL — ABNORMAL LOW (ref 12.0–15.0)
Immature Granulocytes: 1 %
Lymphocytes Relative: 8 %
Lymphs Abs: 0.7 10*3/uL (ref 0.7–4.0)
MCH: 26.2 pg (ref 26.0–34.0)
MCHC: 34.4 g/dL (ref 30.0–36.0)
MCV: 76.2 fL — ABNORMAL LOW (ref 80.0–100.0)
Monocytes Absolute: 0.4 10*3/uL (ref 0.1–1.0)
Monocytes Relative: 4 %
Neutro Abs: 7.6 10*3/uL (ref 1.7–7.7)
Neutrophils Relative %: 87 %
Platelets: 217 10*3/uL (ref 150–400)
RBC: 4.42 MIL/uL (ref 3.87–5.11)
RDW: 13.6 % (ref 11.5–15.5)
WBC: 8.7 10*3/uL (ref 4.0–10.5)
nRBC: 0.2 % (ref 0.0–0.2)

## 2018-06-02 LAB — I-STAT BETA HCG BLOOD, ED (MC, WL, AP ONLY): I-stat hCG, quantitative: <5 m[IU]/mL (ref <5)

## 2018-06-02 LAB — LIPASE, BLOOD: Lipase: 119 U/L — ABNORMAL HIGH (ref 11–51)

## 2018-06-02 MED ORDER — KETOROLAC TROMETHAMINE 30 MG/ML IJ SOLN
30.0000 mg | Freq: Once | INTRAMUSCULAR | Status: AC
  Administered 2018-06-02: 30 mg via INTRAVENOUS
  Filled 2018-06-02: qty 1

## 2018-06-02 MED ORDER — DIPHENHYDRAMINE HCL 25 MG PO CAPS
25.0000 mg | ORAL_CAPSULE | Freq: Once | ORAL | Status: AC
  Administered 2018-06-02: 25 mg via ORAL
  Filled 2018-06-02: qty 1

## 2018-06-02 MED ORDER — SODIUM CHLORIDE 0.9 % IV BOLUS
1000.0000 mL | Freq: Once | INTRAVENOUS | Status: AC
  Administered 2018-06-02: 1000 mL via INTRAVENOUS

## 2018-06-02 MED ORDER — LIDOCAINE VISCOUS HCL 2 % MT SOLN
15.0000 mL | Freq: Once | OROMUCOSAL | Status: AC
  Administered 2018-06-02: 15 mL via ORAL
  Filled 2018-06-02: qty 15

## 2018-06-02 MED ORDER — ONDANSETRON 4 MG PO TBDP
4.0000 mg | ORAL_TABLET | Freq: Once | ORAL | Status: AC | PRN
  Administered 2018-06-02: 4 mg via ORAL
  Filled 2018-06-02: qty 1

## 2018-06-02 MED ORDER — HYDROMORPHONE HCL 1 MG/ML IJ SOLN
INTRAMUSCULAR | Status: AC
  Administered 2018-06-02: 1 mg
  Filled 2018-06-02: qty 1

## 2018-06-02 MED ORDER — SODIUM CHLORIDE 0.9% FLUSH
3.0000 mL | Freq: Once | INTRAVENOUS | Status: AC
  Administered 2018-06-02: 3 mL via INTRAVENOUS

## 2018-06-02 MED ORDER — ALUM & MAG HYDROXIDE-SIMETH 200-200-20 MG/5ML PO SUSP
30.0000 mL | Freq: Once | ORAL | Status: AC
  Administered 2018-06-02: 30 mL via ORAL
  Filled 2018-06-02: qty 30

## 2018-06-02 MED ORDER — HYDROMORPHONE HCL 1 MG/ML IJ SOLN
1.0000 mg | Freq: Once | INTRAMUSCULAR | Status: DC
  Filled 2018-06-02: qty 1

## 2018-06-02 MED ORDER — HYDROMORPHONE HCL 1 MG/ML IJ SOLN: 1.0000 mg | Freq: Once | INTRAMUSCULAR | Status: DC

## 2018-06-02 MED ORDER — METOCLOPRAMIDE HCL 5 MG/ML IJ SOLN
10.0000 mg | Freq: Once | INTRAMUSCULAR | Status: AC
  Administered 2018-06-02: 10 mg via INTRAVENOUS
  Filled 2018-06-02: qty 2

## 2018-06-02 MED ORDER — PANTOPRAZOLE SODIUM 40 MG IV SOLR
40.0000 mg | Freq: Once | INTRAVENOUS | Status: AC
  Administered 2018-06-02: 40 mg via INTRAVENOUS
  Filled 2018-06-02: qty 40

## 2018-06-02 NOTE — ED Provider Notes (Signed)
[facility]-EMERGENCY DEPT Provider Note   CSN: [id number] Arrival date & time: 06/02/18  0325    History   Chief Complaint Chief Complaint  Patient presents with  . Abdominal Pain    HPI Hannah Hardy is a 28 y.o. female.     28 year old female with a history of pancreatitis and asthma presents to the emergency department for evaluation of persistent upper abdominal pain.  She was seen yesterday for these symptoms which have been ongoing x4 days.  Diagnosed with pancreatitis on CT scan.  Lipase yesterday at 269.  She reports that she did not get any of her prescriptions filled following discharge.  She has continued to experience nausea with approximately 12 episodes of vomiting.  Emesis occasionally streaked with bright red blood.  She has not had any alcohol to drink since discharge; usually drinks 2-3 drinks/day.  No fevers, bowel changes, urinary symptoms, history of abdominal surgeries.  The history is provided by the patient. No language interpreter was used.  Abdominal Pain    Past Medical History:  Diagnosis Date  . Asthma   . Pancreatitis     Patient Active Problem List   Diagnosis Date Noted  . Hypokalemia 02/12/2016  . Elevated lipase 02/12/2016  . Enteritis 02/12/2016  . Dehydration 02/12/2016  . Leukocytosis 02/12/2016  . Obesity 02/12/2016  . Asthma 02/12/2016  . Cholelithiasis 11/26/2015  . Alcohol abuse 11/26/2015  . Acute pancreatitis without infection or necrosis 11/25/2015    History reviewed. No pertinent surgical history.   OB History   No obstetric history on file.      Home Medications    Prior to Admission medications   Medication Sig Start Date End Date Taking? Authorizing Provider  acetaminophen (TYLENOL) 500 MG tablet Take 1,000 mg by mouth every 6 (six) hours as needed for mild pain.   Yes [provider]  albuterol (PROVENTIL HFA;VENTOLIN HFA) 108 (90 Base) MCG/ACT inhaler Inhale 1-2 puffs into  the lungs every 6 (six) hours as needed for wheezing or shortness of breath.   Yes [provider]  omeprazole (PRILOSEC) 40 MG capsule Take 1 capsule (40 mg total) by mouth daily for 30 days. 06/01/18 07/01/18  Liberty HandyGibbons, Claudia J, PA-C  ondansetron (ZOFRAN ODT) 4 MG disintegrating tablet Take 1 tablet (4 mg total) by mouth every 8 (eight) hours as needed for nausea or vomiting. 06/01/18   Liberty HandyGibbons, Claudia J, PA-C  traMADol (ULTRAM) 50 MG tablet Take 1 tablet (50 mg total) by mouth every 6 (six) hours as needed. Patient taking differently: Take 50 mg by mouth every 6 (six) hours as needed for moderate pain or severe pain.  06/01/18   Liberty HandyGibbons, Claudia J, PA-C    Family History History reviewed. No pertinent family history.  Social History Social History   Tobacco Use  . Smoking status: Current Every Day Smoker    Packs/day: 1.00    Types: Cigarettes  . Smokeless tobacco: Never Used  Substance Use Topics  . Alcohol use: Yes    Comment: occ  . Drug use: Yes    Types: Marijuana    Comment: Quit marijuana     Allergies   Patient has no known allergies.   Review of Systems Review of Systems  Gastrointestinal: Positive for abdominal pain.  Ten systems reviewed and are negative for acute change, except as noted in the HPI.    Physical Exam Updated Vital Signs BP 138/71 (BP Location: Left Arm)   Pulse 84  Temp 98.7 F (37.1 C) (Oral)   Resp 20   Ht 5\' 7"  (1.702 m)   Wt 94.8 kg   LMP 05/19/2018   SpO2 100%   BMI 32.73 kg/m   Physical Exam Vitals signs and nursing note reviewed.  Constitutional:      General: She is not in acute distress.    Appearance: She is well-developed. She is not diaphoretic.     Comments: Patient uncomfortable in the bed, moving around frequently. Nontoxic appearing.  HENT:     Head: Normocephalic and atraumatic.  Eyes:     General: No scleral icterus.    Conjunctiva/sclera: Conjunctivae normal.  Neck:     Musculoskeletal: Normal  range of motion.  Pulmonary:     Effort: Pulmonary effort is normal. No respiratory distress.     Comments: Respirations even and unlabored Abdominal:     General: There is no distension.     Palpations: Abdomen is soft. There is no mass.     Tenderness: There is abdominal tenderness. There is no rebound.     Comments: Abdomen soft, obese.  There is focal tenderness in the epigastrium and left upper quadrant.  No peritoneal signs or palpable masses.  Musculoskeletal: Normal range of motion.  Skin:    General: Skin is warm and dry.     Coloration: Skin is not pale.     Findings: No erythema or rash.  Neurological:     General: No focal deficit present.     Mental Status: She is alert and oriented to person, place, and time.     Coordination: Coordination normal.  Psychiatric:        Behavior: Behavior normal.      ED Treatments / Results  Labs (all labs ordered are listed, but only abnormal results are displayed) Labs Reviewed  CBC WITH DIFFERENTIAL/PLATELET - Abnormal; Notable for the following components:      Result Value   Hemoglobin 11.6 (*)    HCT 33.7 (*)    MCV 76.2 (*)    All other components within normal limits  COMPREHENSIVE METABOLIC PANEL - Abnormal; Notable for the following components:   Potassium 5.2 (*)    CO2 21 (*)    Glucose, Bld 114 (*)    Total Protein 8.4 (*)    All other components within normal limits  LIPASE, BLOOD - Abnormal; Notable for the following components:   Lipase 119 (*)    All other components within normal limits  I-STAT BETA HCG BLOOD, ED ([facility], [facility], [facility] ONLY)    EKG None  Radiology Ct Abdomen Pelvis W Contrast  Result Date: 06/01/2018 CLINICAL DATA:  History of pancreatitis. Abdominal pain, nausea, and vomiting for 3 days. EXAM: CT ABDOMEN AND PELVIS WITH CONTRAST TECHNIQUE: Multidetector CT imaging of the abdomen and pelvis was performed using the standard protocol following bolus administration of intravenous contrast.  CONTRAST:  OMNIPAQUE IOHEXOL 300 MG/ML  SOLN COMPARISON:  February 12, 2016 FINDINGS: Lower chest: No acute abnormality. Hepatobiliary: No focal liver abnormality is seen. No gallstones, gallbladder wall thickening, or biliary dilatation. Pancreas: There is fat stranding around the pancreatic uncinate process, head, and neck. The underlying parenchyma demonstrates no evidence of necrosis or mass. The remainder of the pancreas is normal. Spleen: Normal in size without focal abnormality. Adrenals/Urinary Tract: The stomach is normal. The second and third portions of the duodenum traversed the fat stranding adjacent to the pancreas. The remainder of the small bowel is normal. The colon and  visualized portions of the appendix are normal. Stomach/Bowel: Stomach is within normal limits. Appendix appears normal. No evidence of bowel wall thickening, distention, or inflammatory changes. Vascular/Lymphatic: No significant vascular findings are present. No enlarged abdominal or pelvic lymph nodes. Reproductive: 2.3 cm simple cyst in left ovary of no acute significance. The uterus and adnexa are otherwise normal. Other: No abdominal wall hernia or abnormality. No abdominopelvic ascites. Musculoskeletal: No acute or significant osseous findings. IMPRESSION: 1. Pancreatitis. Electronically Signed   By: Gerome Sam III M.D   On: 06/01/2018 11:31    Procedures Procedures (including critical care time)  Medications Ordered in ED Medications  HYDROmorphone (DILAUDID) injection 1 mg ( Intravenous Canceled Entry 06/02/18 0611)  sodium chloride 0.9 % bolus 1,000 mL (0 mLs Intravenous Stopped 06/02/18 0551)  sodium chloride flush (NS) 0.9 % injection 3 mL (3 mLs Intravenous Given 06/02/18 0435)  ondansetron (ZOFRAN-ODT) disintegrating tablet 4 mg (4 mg Oral Given 06/02/18 0337)  metoCLOPramide (REGLAN) injection 10 mg (10 mg Intravenous Given 06/02/18 0425)  ketorolac (TORADOL) 30 MG/ML injection 30 mg (30 mg  Intravenous Given 06/02/18 0425)  HYDROmorphone (DILAUDID) 1 MG/ML injection (1 mg  Given 06/02/18 0433)  sodium chloride 0.9 % bolus 1,000 mL (1,000 mLs Intravenous New Bag/Given 06/02/18 0557)  diphenhydrAMINE (BENADRYL) capsule 25 mg (25 mg Oral Given 06/02/18 0602)  pantoprazole (PROTONIX) injection 40 mg (40 mg Intravenous Given 06/02/18 0602)  alum & mag hydroxide-simeth (MAALOX/MYLANTA) 200-200-20 MG/5ML suspension 30 mL (30 mLs Oral Given 06/02/18 0602)    And  lidocaine (XYLOCAINE) 2 % viscous mouth solution 15 mL (15 mLs Oral Given 06/02/18 0602)    5:55 AM Patient with stable labs, improving lipase. Pain down to 7/10 from 10/10 on arrival. Will dose with additional medications and PO trial. Ranson criteria c/w low risk of mortality. Anticipate d/c if tolerating fluids. Patient did not fill any of her prior prescriptions for home symptom control; stressed the need for her to do this.  6:37 AM Patient continues to feel better.  She is tolerating apple juice.  Sleeping on repeat assessment.  Plan for discharge and outpatient primary care follow-up.   Initial Impression / Assessment and Plan / ED Course  I have reviewed the triage vital signs and the nursing notes.  Pertinent labs & imaging results that were available during my care of the patient were reviewed by me and considered in my medical decision making (see chart for details).        28 year old female presents to the emergency department for persistent abdominal pain with nausea and vomiting.  Was seen yesterday and diagnosed with alcohol induced pancreatitis.  She has not been drinking alcohol since discharge, but did not get her prescribed medications filled for symptom control.  Her labs today are stable.  Lipase improved compared to 24 hours prior.  She has been hydrated with 2 L IV fluids.  Now tolerating oral fluids.  Repeat abdominal exam is improved.  Her CT from yesterday was reviewed.  Pancreatitis on imaging noted to be  uncomplicated without necrosis or infection.  Patient to continue a clear liquid diet.  Encouraged follow-up with a primary care doctor.  Stressed the need for her to pick up her prescribed medications for ongoing management.  Return precautions discussed and provided. Patient discharged in stable condition with no unaddressed concerns.  Vitals:   06/02/18 0334 06/02/18 0644  BP: 138/71 129/73  Pulse: 84 89  Resp: 20 14  Temp: 98.7 F (37.1 C)  TempSrc: Oral   SpO2: 100% 99%  Weight: 94.8 kg   Height:  (1.702 m)     Final Clinical Impressions(s) / ED Diagnoses   Final diagnoses:  Acute pancreatitis without infection or necrosis, unspecified pancreatitis type    ED Discharge Orders    None       Antony Madura, PA-C 06/02/18 1610    Marily Memos, MD 06/02/18 [phone]

## 2018-06-02 NOTE — ED Triage Notes (Signed)
Patient is complaining of upper abdominal pain. Patient states she was here yesterday morning for the same thing.

## 2018-06-02 NOTE — ED Notes (Signed)
Ordered dose of Dilaudid 1mg  IV dropped on floor, broke and spilled. Med wasted in Pyxis with witness, Systems analyst.

## 2018-06-02 NOTE — Discharge Instructions (Signed)
Stop drinking alcohol as this is likely the cause of your pancreatitis.  You should continue on a clear liquid diet for at least the next 1 to 2 weeks.  Specifically remain on a clear liquid diet while you are experiencing abdominal discomfort.  Get your prescriptions, previously provided, filled at your local pharmacy for symptom control.  Follow-up with a primary care doctor to ensure symptoms resolve.

## 2018-07-22 ENCOUNTER — Emergency Department (HOSPITAL_COMMUNITY)
Admission: EM | Admit: 2018-07-22 | Discharge: 2018-07-22 | Disposition: A | Discharge status: Home / Self Care | Payer: No Typology Code available for payment source | Attending: Emergency Medicine | Admitting: Emergency Medicine

## 2018-07-22 ENCOUNTER — Other Ambulatory Visit: Payer: Self-pay

## 2018-07-22 ENCOUNTER — Emergency Department (HOSPITAL_COMMUNITY): Payer: No Typology Code available for payment source

## 2018-07-22 ENCOUNTER — Encounter (HOSPITAL_COMMUNITY): Payer: Self-pay | Admitting: Emergency Medicine

## 2018-07-22 DIAGNOSIS — Y998 Other external cause status: Secondary | ICD-10-CM | POA: Diagnosis not present

## 2018-07-22 DIAGNOSIS — D509 Iron deficiency anemia, unspecified: Secondary | ICD-10-CM | POA: Insufficient documentation

## 2018-07-22 DIAGNOSIS — S0990XA Unspecified injury of head, initial encounter: Secondary | ICD-10-CM | POA: Diagnosis present

## 2018-07-22 DIAGNOSIS — S02831A Fracture of medial orbital wall, right side, initial encounter for closed fracture: Secondary | ICD-10-CM

## 2018-07-22 DIAGNOSIS — F1210 Cannabis abuse, uncomplicated: Secondary | ICD-10-CM | POA: Insufficient documentation

## 2018-07-22 DIAGNOSIS — F17210 Nicotine dependence, cigarettes, uncomplicated: Secondary | ICD-10-CM | POA: Insufficient documentation

## 2018-07-22 DIAGNOSIS — J45909 Unspecified asthma, uncomplicated: Secondary | ICD-10-CM | POA: Diagnosis not present

## 2018-07-22 DIAGNOSIS — Y92410 Unspecified street and highway as the place of occurrence of the external cause: Secondary | ICD-10-CM | POA: Diagnosis not present

## 2018-07-22 DIAGNOSIS — R112 Nausea with vomiting, unspecified: Secondary | ICD-10-CM | POA: Insufficient documentation

## 2018-07-22 DIAGNOSIS — R109 Unspecified abdominal pain: Secondary | ICD-10-CM | POA: Insufficient documentation

## 2018-07-22 DIAGNOSIS — S022XXA Fracture of nasal bones, initial encounter for closed fracture: Secondary | ICD-10-CM | POA: Diagnosis not present

## 2018-07-22 DIAGNOSIS — Z23 Encounter for immunization: Secondary | ICD-10-CM | POA: Insufficient documentation

## 2018-07-22 DIAGNOSIS — Y9389 Activity, other specified: Secondary | ICD-10-CM | POA: Insufficient documentation

## 2018-07-22 DIAGNOSIS — F10920 Alcohol use, unspecified with intoxication, uncomplicated: Secondary | ICD-10-CM | POA: Insufficient documentation

## 2018-07-22 LAB — CBC WITH DIFFERENTIAL/PLATELET
Abs Immature Granulocytes: 0.05 10*3/uL (ref 0.00–0.07)
Basophils Absolute: 0 10*3/uL (ref 0.0–0.1)
Basophils Relative: 0 %
Eosinophils Absolute: 0.2 10*3/uL (ref 0.0–0.5)
Eosinophils Relative: 2 %
HCT: 32.8 % — ABNORMAL LOW (ref 36.0–46.0)
Hemoglobin: 11.3 g/dL — ABNORMAL LOW (ref 12.0–15.0)
Immature Granulocytes: 1 %
Lymphocytes Relative: 34 %
Lymphs Abs: 2.7 10*3/uL (ref 0.7–4.0)
MCH: 26.3 pg (ref 26.0–34.0)
MCHC: 34.5 g/dL (ref 30.0–36.0)
MCV: 76.5 fL — ABNORMAL LOW (ref 80.0–100.0)
Monocytes Absolute: 0.5 10*3/uL (ref 0.1–1.0)
Monocytes Relative: 7 %
Neutro Abs: 4.5 10*3/uL (ref 1.7–7.7)
Neutrophils Relative %: 56 %
Platelets: 235 10*3/uL (ref 150–400)
RBC: 4.29 MIL/uL (ref 3.87–5.11)
RDW: 14.4 % (ref 11.5–15.5)
WBC: 7.9 10*3/uL (ref 4.0–10.5)
nRBC: 0.3 % — ABNORMAL HIGH (ref 0.0–0.2)

## 2018-07-22 LAB — COMPREHENSIVE METABOLIC PANEL
ALT: 12 U/L (ref 0–44)
AST: 20 U/L (ref 15–41)
Albumin: 4.1 g/dL (ref 3.5–5.0)
Alkaline Phosphatase: 45 U/L (ref 38–126)
Anion gap: 10 (ref 5–15)
BUN: 7 mg/dL (ref 6–20)
CO2: 24 mmol/L (ref 22–32)
Calcium: 8.6 mg/dL — ABNORMAL LOW (ref 8.9–10.3)
Chloride: 107 mmol/L (ref 98–111)
Creatinine, Ser: 0.6 mg/dL (ref 0.44–1.00)
GFR calc Af Amer: >60 mL/min (ref >60)
GFR calc non Af Amer: >60 mL/min (ref >60)
Glucose, Bld: 95 mg/dL (ref 70–99)
Potassium: 4.1 mmol/L (ref 3.5–5.1)
Sodium: 141 mmol/L (ref 135–145)
Total Bilirubin: 0.2 mg/dL — ABNORMAL LOW (ref 0.3–1.2)
Total Protein: 7.6 g/dL (ref 6.5–8.1)

## 2018-07-22 LAB — I-STAT BETA HCG BLOOD, ED (MC, WL, AP ONLY): I-stat hCG, quantitative: <5 m[IU]/mL (ref <5)

## 2018-07-22 LAB — ETHANOL: Alcohol, Ethyl (B): 303 mg/dL (ref <10)

## 2018-07-22 LAB — LACTIC ACID, PLASMA: Lactic Acid, Venous: 1.3 mmol/L (ref 0.5–1.9)

## 2018-07-22 MED ORDER — KETOROLAC TROMETHAMINE 30 MG/ML IJ SOLN
30.0000 mg | Freq: Once | INTRAMUSCULAR | Status: AC
  Administered 2018-07-22: 07:00:00 30 mg via INTRAVENOUS
  Filled 2018-07-22: qty 1

## 2018-07-22 MED ORDER — TETANUS-DIPHTH-ACELL PERTUSSIS 5-2.5-18.5 LF-MCG/0.5 IM SUSP
0.5000 mL | Freq: Once | INTRAMUSCULAR | Status: AC
  Administered 2018-07-22: 04:00:00 0.5 mL via INTRAMUSCULAR
  Filled 2018-07-22: qty 0.5

## 2018-07-22 MED ORDER — IOHEXOL 300 MG/ML  SOLN
100.0000 mL | Freq: Once | INTRAMUSCULAR | Status: AC | PRN
  Administered 2018-07-22: 100 mL via INTRAVENOUS

## 2018-07-22 MED ORDER — ONDANSETRON HCL 4 MG/2ML IJ SOLN
4.0000 mg | Freq: Once | INTRAMUSCULAR | Status: AC
  Administered 2018-07-22: 04:00:00 4 mg via INTRAVENOUS
  Filled 2018-07-22: qty 2

## 2018-07-22 MED ORDER — SODIUM CHLORIDE (PF) 0.9 % IJ SOLN
INTRAMUSCULAR | Status: AC
  Administered 2018-07-22: 06:00:00
  Filled 2018-07-22: qty 50

## 2018-07-22 MED ORDER — MORPHINE SULFATE (PF) 4 MG/ML IV SOLN
4.0000 mg | Freq: Once | INTRAVENOUS | Status: AC
  Administered 2018-07-22: 04:00:00 4 mg via INTRAVENOUS
  Filled 2018-07-22: qty 1
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
## 2018-07-22 NOTE — Discharge Instructions (Addendum)
Apply ice to sore areas - thirty minutes at a time, four times a day.  Take ibuprofen or naproxen as needed for pain.  Take acetaminophen as needed for additional pain relief.

## 2018-07-22 NOTE — ED Notes (Signed)
Date and time results received: 07/22/18 4:43 AM  (use smartphrase ".now" to insert current time)  Test: ETOH Critical Value: 303  Name of Provider Notified: Dr.Glick  Orders Received? Or Actions Taken?: None

## 2018-07-22 NOTE — ED Notes (Signed)
Bed: [facility] Expected date:  Expected time:  Means of arrival:  Comments: 
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
## 2018-12-16 DIAGNOSIS — K859 Acute pancreatitis without necrosis or infection, unspecified: Secondary | ICD-10-CM

## 2020-02-25 ENCOUNTER — Emergency Department: Admit: 2020-02-26 | Payer: PRIVATE HEALTH INSURANCE

## 2020-02-25 ENCOUNTER — Inpatient Hospital Stay
Admit: 2020-02-25 | Discharge: 2020-02-26 | Disposition: A | Discharge status: Home / Self Care | Payer: PRIVATE HEALTH INSURANCE | Attending: Emergency Medicine

## 2020-02-25 DIAGNOSIS — K852 Alcohol induced acute pancreatitis without necrosis or infection: Secondary | ICD-10-CM

## 2020-02-25 LAB — POC URINE MACROSCOPIC
Blood, Urine: NEGATIVE
Blood: NEGATIVE
Glucose, Ur: NEGATIVE mg/dl
Glucose: NEGATIVE mg/dl
Ketone: >=160 mg/dl — AB
Ketones, Urine: >=160 mg/dl — AB
Leukocyte Esterase, Urine: NEGATIVE
Leukocyte Esterase: NEGATIVE
Nitrite, Urine: NEGATIVE
Nitrites: NEGATIVE
Protein, UA: >=300 mg/dl — AB
Protein: >=300 mg/dl — AB
Specific Gravity, UA: >=1.03 (ref 1.005–1.030)
Specific gravity: >=1.03 (ref 1.005–1.030)
Urobilinogen, UA, POCT: 1 EU/dl (ref 0.0–1.0)
Urobilinogen: 1 EU/dl (ref 0.0–1.0)
pH (UA): 6 (ref 5–9)
pH, UA: 6 (ref 5–9)

## 2020-02-25 LAB — POC HCG,URINE
HCG urine, QL: NEGATIVE
Pregnancy Test(Urn): NEGATIVE

## 2020-02-25 MED ORDER — SODIUM CHLORIDE 0.9% BOLUS IV
0.9 % | INTRAVENOUS | Status: AC
Start: 2020-02-25 — End: 2020-02-25
  Administered 2020-02-25: via INTRAVENOUS

## 2020-02-25 MED ORDER — MORPHINE 4 MG/ML SYRINGE
4 mg/mL | INTRAMUSCULAR | Status: AC
Start: 2020-02-25 — End: 2020-02-25
  Administered 2020-02-25: via INTRAVENOUS

## 2020-02-25 MED ORDER — FAMOTIDINE (PF) 20 MG/2 ML IV
20 mg/2 mL | INTRAVENOUS | Status: AC
Start: 2020-02-25 — End: 2020-02-25
  Administered 2020-02-25: via INTRAVENOUS

## 2020-02-25 MED ORDER — SODIUM CHLORIDE 0.9 % IJ SYRG
Freq: Once | INTRAMUSCULAR | Status: DC
Start: 2020-02-25 — End: 2020-02-26

## 2020-02-25 MED ORDER — ONDANSETRON (PF) 4 MG/2 ML INJECTION
4 mg/2 mL | Freq: Once | INTRAMUSCULAR | Status: AC
Start: 2020-02-25 — End: 2020-02-25
  Administered 2020-02-25: via INTRAVENOUS

## 2020-02-25 MED FILL — FAMOTIDINE (PF) 20 MG/2 ML IV: 20 mg/2 mL | INTRAVENOUS | Qty: 2

## 2020-02-25 MED FILL — SODIUM CHLORIDE 0.9 % IV: INTRAVENOUS | Qty: 1000

## 2020-02-25 MED FILL — MORPHINE 4 MG/ML SYRINGE: 4 mg/mL | INTRAMUSCULAR | Qty: 1

## 2020-02-25 MED FILL — ONDANSETRON (PF) 4 MG/2 ML INJECTION: 4 mg/2 mL | INTRAMUSCULAR | Qty: 2

## 2020-02-25 NOTE — ED Notes (Signed)
PA Jan was at bedside and spoke to the patient.  After she left. Pt became involved with a phone call.  Will return.
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
## 2020-02-25 NOTE — ED Notes (Signed)
Pt laughing with friend on phone
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
## 2020-02-25 NOTE — ED Notes (Signed)
Pt to CT and returned.  While Pt was at CT PA Jan came to see her.  Let patient know that she came to see her and that the PA will return (Paged PA Jan)

## 2020-02-26 LAB — METABOLIC PANEL, BASIC
Anion gap: 9 mmol/L (ref 5–15)
BUN: 7 mg/dl (ref 7–25)
CO2: 20 mEq/L — ABNORMAL LOW (ref 21–32)
Calcium: 9.8 mg/dl (ref 8.5–10.1)
Chloride: 108 mEq/L — ABNORMAL HIGH (ref 98–107)
Creatinine: 0.6 mg/dl (ref 0.6–1.3)
GFR est AA: >60
GFR est non-AA: >60
Glucose: 98 mg/dl (ref 74–106)
Potassium: 3.7 mEq/L (ref 3.5–5.1)
Sodium: 137 mEq/L (ref 136–145)

## 2020-02-26 LAB — HEPATIC FUNCTION PANEL
ALT (SGPT): 18 U/L (ref 12–78)
ALT: 18 U/L (ref 12–78)
AST (SGOT): 13 U/L — ABNORMAL LOW (ref 15–37)
AST: 13 U/L — ABNORMAL LOW (ref 15–37)
Albumin: 4.1 gm/dl (ref 3.4–5.0)
Albumin: 4.1 gm/dl (ref 3.4–5.0)
Alk. phosphatase: 58 U/L (ref 45–117)
Alkaline Phosphatase: 58 U/L (ref 45–117)
Bilirubin, Direct: 0.1 mg/dl (ref 0.0–0.2)
Bilirubin, direct: 0.1 mg/dl (ref 0.0–0.2)
Bilirubin, total: 0.4 mg/dl (ref 0.2–1.0)
Protein, total: 8.5 gm/dl — ABNORMAL HIGH (ref 6.4–8.2)
Total Bilirubin: 0.4 mg/dl (ref 0.2–1.0)
Total Protein: 8.5 gm/dl — ABNORMAL HIGH (ref 6.4–8.2)

## 2020-02-26 LAB — CBC WITH AUTOMATED DIFF
BASOPHILS: 0.1 % (ref 0–3)
EOSINOPHILS: 1.1 % (ref 0–5)
HCT: 35.2 % — ABNORMAL LOW (ref 37.0–50.0)
HGB: 12.4 gm/dl — ABNORMAL LOW (ref 13.0–17.2)
IMMATURE GRANULOCYTES: 0.5 % (ref 0.0–3.0)
LYMPHOCYTES: 22.3 % — ABNORMAL LOW (ref 28–48)
MCH: 27.1 pg (ref 25.4–34.6)
MCHC: 35.2 gm/dl (ref 30.0–36.0)
MCV: 77 fL — ABNORMAL LOW (ref 80.0–98.0)
MONOCYTES: 8.5 % (ref 1–13)
MPV: 11.7 fL — ABNORMAL HIGH (ref 6.0–10.0)
NEUTROPHILS: 67.5 % — ABNORMAL HIGH (ref 34–64)
NRBC: 0 (ref 0–0)
PLATELET: 226 10*3/uL (ref 140–450)
RBC: 4.57 M/uL (ref 3.60–5.20)
RDW-SD: 40.5 (ref 36.4–46.3)
WBC: 8.8 10*3/uL (ref 4.0–11.0)

## 2020-02-26 LAB — LIPASE
Lipase: 103 U/L (ref 73–393)
Lipase: 103 U/L (ref 73–393)

## 2020-02-26 LAB — ETHYL ALCOHOL
ALCOHOL(ETHYL),SERUM: 4 mg/dl (ref 0.0–9.0)
Ethyl Alcohol: 4 mg/dl (ref 0.0–9.0)

## 2020-02-26 LAB — BASIC METABOLIC PANEL
Anion Gap: 9 mmol/L (ref 5–15)
BUN: 7 mg/dl (ref 7–25)
CO2: 20 mEq/L — ABNORMAL LOW (ref 21–32)
Calcium: 9.8 mg/dl (ref 8.5–10.1)
Chloride: 108 mEq/L — ABNORMAL HIGH (ref 98–107)
Creatinine: 0.6 mg/dl (ref 0.6–1.3)
EGFR IF NonAfrican American: >60
GFR African American: >60
Glucose: 98 mg/dl (ref 74–106)
Potassium: 3.7 mEq/L (ref 3.5–5.1)
Sodium: 137 mEq/L (ref 136–145)

## 2020-02-26 LAB — CBC WITH AUTO DIFFERENTIAL
Basophils %: 0.1 % (ref 0–3)
Eosinophils %: 1.1 % (ref 0–5)
Hematocrit: 35.2 % — ABNORMAL LOW (ref 37.0–50.0)
Hemoglobin: 12.4 gm/dl — ABNORMAL LOW (ref 13.0–17.2)
Immature Granulocytes: 0.5 % (ref 0.0–3.0)
Lymphocytes %: 22.3 % — ABNORMAL LOW (ref 28–48)
MCH: 27.1 pg (ref 25.4–34.6)
MCHC: 35.2 gm/dl (ref 30.0–36.0)
MCV: 77 fL — ABNORMAL LOW (ref 80.0–98.0)
MPV: 11.7 fL — ABNORMAL HIGH (ref 6.0–10.0)
Monocytes %: 8.5 % (ref 1–13)
Neutrophils %: 67.5 % — ABNORMAL HIGH (ref 34–64)
Nucleated RBCs: 0 (ref 0–0)
Platelets: 226 10*3/uL (ref 140–450)
RBC: 4.57 M/uL (ref 3.60–5.20)
RDW-SD: 40.5 (ref 36.4–46.3)
WBC: 8.8 10*3/uL (ref 4.0–11.0)

## 2020-02-26 MED ORDER — PANTOPRAZOLE 40 MG TAB, DELAYED RELEASE
40 mg | ORAL_TABLET | Freq: Every day | ORAL | 0 refills | Status: DC
Start: 2020-02-26 — End: 2020-02-25

## 2020-02-26 MED ORDER — FAMOTIDINE 20 MG TAB
20 mg | ORAL_TABLET | Freq: Two times a day (BID) | ORAL | 0 refills | Status: DC
Start: 2020-02-26 — End: 2020-02-25

## 2020-02-26 MED ORDER — MORPHINE 4 MG/ML SYRINGE
4 mg/mL | INTRAMUSCULAR | Status: AC
Start: 2020-02-26 — End: 2020-02-25
  Administered 2020-02-26: 03:00:00 via INTRAVENOUS

## 2020-02-26 MED ORDER — ONDANSETRON 4 MG TAB, RAPID DISSOLVE
4 mg | ORAL_TABLET | Freq: Three times a day (TID) | ORAL | 0 refills | Status: DC | PRN
Start: 2020-02-26 — End: 2020-02-25

## 2020-02-26 MED ORDER — IOPAMIDOL 61 % IV SOLN
30061 mg iodine /mL (61 %) | Freq: Once | INTRAVENOUS | Status: AC
Start: 2020-02-26 — End: 2020-02-25
  Administered 2020-02-26: 01:00:00 via INTRAVENOUS

## 2020-02-26 MED ORDER — SUCRALFATE 1 GRAM TAB
1 gram | ORAL_TABLET | Freq: Three times a day (TID) | ORAL | 0 refills | Status: AC
Start: 2020-02-26 — End: 2020-03-01

## 2020-02-26 MED ORDER — PANTOPRAZOLE 40 MG TAB, DELAYED RELEASE
40 mg | ORAL_TABLET | Freq: Every day | ORAL | 0 refills | Status: AC
Start: 2020-02-26 — End: 2020-03-03

## 2020-02-26 MED ORDER — ONDANSETRON 4 MG TAB, RAPID DISSOLVE
4 mg | ORAL_TABLET | Freq: Three times a day (TID) | ORAL | 0 refills | Status: AC | PRN
Start: 2020-02-26 — End: 2020-03-01

## 2020-02-26 MED ORDER — FAMOTIDINE 20 MG TAB
20 mg | ORAL_TABLET | Freq: Two times a day (BID) | ORAL | 0 refills | Status: AC
Start: 2020-02-26 — End: 2020-03-01

## 2020-02-26 MED FILL — ISOVUE-300  61 % INTRAVENOUS SOLUTION: 300 mg iodine /mL (61 %) | INTRAVENOUS | Qty: 80

## 2020-02-26 MED FILL — MORPHINE 4 MG/ML SYRINGE: 4 mg/mL | INTRAMUSCULAR | Qty: 1

## 2020-02-26 NOTE — ED Notes (Signed)
Discharge instructions given to patient (name) with verbalization of understanding.  Patient accompanied by self. Patient discharged with the following prescriptions: famotidine. Patient discharged to home (destination).

## 2020-05-06 DIAGNOSIS — K2921 Alcoholic gastritis with bleeding: Secondary | ICD-10-CM

## 2020-05-06 NOTE — ED Notes (Signed)
Pt reports " I can't sit in the wheel chair I need to lay down" Pt found laying in the bathroom because she says its too painful to sit up. PT asked to get off the floor and sit in the chairs in the lobby.

## 2020-05-06 NOTE — ED Notes (Signed)
Pt arrived via EMS from home. Went out drinking last night and now is having upper abdominal pain. Pt has a history of Pancreatitis.

## 2020-05-07 ENCOUNTER — Inpatient Hospital Stay
Admit: 2020-05-07 | Discharge: 2020-05-07 | Disposition: A | Discharge status: Home / Self Care | Payer: PRIVATE HEALTH INSURANCE | Attending: Emergency Medicine

## 2020-05-07 ENCOUNTER — Emergency Department: Admit: 2020-05-07 | Payer: PRIVATE HEALTH INSURANCE

## 2020-05-07 LAB — POC URINE MACROSCOPIC
Blood, Urine: NEGATIVE
Blood: NEGATIVE
Glucose, Ur: 100 mg/dl — AB
Glucose: 100 mg/dl — AB
Ketone: 15 mg/dl — AB
Ketones, Urine: 15 mg/dl — AB
Leukocyte Esterase, Urine: NEGATIVE
Leukocyte Esterase: NEGATIVE
Nitrite, Urine: NEGATIVE
Nitrites: NEGATIVE
Protein, UA: >=300 mg/dl — AB
Protein: >=300 mg/dl — AB
Specific Gravity, UA: >=1.03 (ref 1.005–1.030)
Specific gravity: >=1.03 (ref 1.005–1.030)
Urobilinogen, UA, POCT: 0.2 EU/dl (ref 0.0–1.0)
Urobilinogen: 0.2 EU/dl (ref 0.0–1.0)
pH (UA): 5.5 (ref 5–9)
pH, UA: 5.5 (ref 5–9)

## 2020-05-07 LAB — CBC WITH AUTOMATED DIFF
BASOPHILS: 0.2 % (ref 0–3)
EOSINOPHILS: 0.3 % (ref 0–5)
HCT: 35.3 % — ABNORMAL LOW (ref 37.0–50.0)
HGB: 12.3 gm/dl — ABNORMAL LOW (ref 13.0–17.2)
IMMATURE GRANULOCYTES: 0.9 % (ref 0.0–3.0)
LYMPHOCYTES: 13.8 % — ABNORMAL LOW (ref 28–48)
MCH: 27.1 pg (ref 25.4–34.6)
MCHC: 34.8 gm/dl (ref 30.0–36.0)
MCV: 77.8 fL — ABNORMAL LOW (ref 80.0–98.0)
MONOCYTES: 6.7 % (ref 1–13)
MPV: 11 fL — ABNORMAL HIGH (ref 6.0–10.0)
NEUTROPHILS: 78.1 % — ABNORMAL HIGH (ref 34–64)
NRBC: 0 (ref 0–0)
PLATELET: 295 10*3/uL (ref 140–450)
RBC: 4.54 M/uL (ref 3.60–5.20)
RDW-SD: 47.7 — ABNORMAL HIGH (ref 36.4–46.3)
WBC: 11.7 10*3/uL — ABNORMAL HIGH (ref 4.0–11.0)

## 2020-05-07 LAB — METABOLIC PANEL, COMPREHENSIVE
ALT (SGPT): 14 U/L (ref 12–78)
AST (SGOT): 20 U/L (ref 15–37)
Albumin: 4.6 gm/dl (ref 3.4–5.0)
Alk. phosphatase: 70 U/L (ref 45–117)
Anion gap: 19 mmol/L — ABNORMAL HIGH (ref 5–15)
BUN: <5 mg/dl — ABNORMAL LOW (ref 7–25)
Bilirubin, total: 0.3 mg/dl (ref 0.2–1.0)
CO2: 17 mEq/L — ABNORMAL LOW (ref 21–32)
Calcium: 10.2 mg/dl — ABNORMAL HIGH (ref 8.5–10.1)
Chloride: 103 mEq/L (ref 98–107)
Creatinine: 0.8 mg/dl (ref 0.6–1.3)
GFR est AA: >60
GFR est non-AA: >60
Glucose: 144 mg/dl — ABNORMAL HIGH (ref 74–106)
Potassium: 3.3 mEq/L — ABNORMAL LOW (ref 3.5–5.1)
Protein, total: 8.8 gm/dl — ABNORMAL HIGH (ref 6.4–8.2)
Sodium: 139 mEq/L (ref 136–145)

## 2020-05-07 LAB — POC BLOOD GAS
BASE EXCESS: 2 mmol/L (ref <=3)
BICARBONATE: 27 mmol/L — ABNORMAL HIGH (ref 22.0–26.0)
Base Excess: 2 mmol/L (ref <=3)
CO2 Total: 28 mmol/L (ref 21–32)
CO2, TOTAL: 28 mmol/L (ref 21–32)
HCO3: 27 mmol/L — ABNORMAL HIGH (ref 22.0–26.0)
O2 SAT: 92 % — ABNORMAL HIGH (ref 70–75)
O2 Sat: 92 % — ABNORMAL HIGH (ref 70–75)
PCO2: 47.8 mm Hg (ref 41.0–51.0)
PCO2: 47.8 mm Hg (ref 41.0–51.0)
PO2: 68 mm Hg — ABNORMAL HIGH (ref 35–40)
PO2: 68 mm Hg — ABNORMAL HIGH (ref 35–40)
pH: 7.36 (ref 7.310–7.410)
pH: 7.36 (ref 7.310–7.410)

## 2020-05-07 LAB — LIPASE
Lipase: 41 U/L (ref 12–53)
Lipase: 41 U/L (ref 12–53)

## 2020-05-07 LAB — DRUG SCREEN, URINE
Amphetamine: NEGATIVE
Amphetamines: NEGATIVE
Barbiturates: NEGATIVE
Barbiturates: NEGATIVE
Benzodiazapines: NEGATIVE
Benzodiazepines: NEGATIVE
Cocaine: NEGATIVE
Cocaine: NEGATIVE
Marijuana: POSITIVE — AB
Marijuana: POSITIVE — AB
Methadone: NEGATIVE
Methadone: NEGATIVE
Opiates: POSITIVE — AB
Opiates: POSITIVE — AB
Phencyclidine: NEGATIVE
Phencyclidine: NEGATIVE

## 2020-05-07 LAB — POC HCG,URINE
HCG urine, QL: NEGATIVE
Pregnancy Test(Urn): NEGATIVE

## 2020-05-07 LAB — ETHYL ALCOHOL
ALCOHOL(ETHYL),SERUM: 6.7 mg/dl (ref 0.0–9.0)
Ethyl Alcohol: 6.7 mg/dl (ref 0.0–9.0)

## 2020-05-07 LAB — TROPONIN-HIGH SENSITIVITY: Troponin-High Sensitivity: <3 ng/L (ref 0–59)

## 2020-05-07 LAB — TROPONIN, HIGH SENSITIVITY: Troponin, High Sensitivity: <3 ng/L (ref 0–59)

## 2020-05-07 LAB — CBC WITH AUTO DIFFERENTIAL
Basophils %: 0.2 % (ref 0–3)
Eosinophils %: 0.3 % (ref 0–5)
Hematocrit: 35.3 % — ABNORMAL LOW (ref 37.0–50.0)
Hemoglobin: 12.3 gm/dl — ABNORMAL LOW (ref 13.0–17.2)
Immature Granulocytes: 0.9 % (ref 0.0–3.0)
Lymphocytes %: 13.8 % — ABNORMAL LOW (ref 28–48)
MCH: 27.1 pg (ref 25.4–34.6)
MCHC: 34.8 gm/dl (ref 30.0–36.0)
MCV: 77.8 fL — ABNORMAL LOW (ref 80.0–98.0)
MPV: 11 fL — ABNORMAL HIGH (ref 6.0–10.0)
Monocytes %: 6.7 % (ref 1–13)
Neutrophils %: 78.1 % — ABNORMAL HIGH (ref 34–64)
Nucleated RBCs: 0 (ref 0–0)
Platelets: 295 10*3/uL (ref 140–450)
RBC: 4.54 M/uL (ref 3.60–5.20)
RDW-SD: 47.7 — ABNORMAL HIGH (ref 36.4–46.3)
WBC: 11.7 10*3/uL — ABNORMAL HIGH (ref 4.0–11.0)

## 2020-05-07 LAB — COMPREHENSIVE METABOLIC PANEL
ALT: 14 U/L (ref 12–78)
AST: 20 U/L (ref 15–37)
Albumin: 4.6 gm/dl (ref 3.4–5.0)
Alkaline Phosphatase: 70 U/L (ref 45–117)
Anion Gap: 19 mmol/L — ABNORMAL HIGH (ref 5–15)
BUN: <5 mg/dl — ABNORMAL LOW (ref 7–25)
CO2: 17 mEq/L — ABNORMAL LOW (ref 21–32)
Calcium: 10.2 mg/dl — ABNORMAL HIGH (ref 8.5–10.1)
Chloride: 103 mEq/L (ref 98–107)
Creatinine: 0.8 mg/dl (ref 0.6–1.3)
EGFR IF NonAfrican American: >60
GFR African American: >60
Glucose: 144 mg/dl — ABNORMAL HIGH (ref 74–106)
Potassium: 3.3 mEq/L — ABNORMAL LOW (ref 3.5–5.1)
Sodium: 139 mEq/L (ref 136–145)
Total Bilirubin: 0.3 mg/dl (ref 0.2–1.0)
Total Protein: 8.8 gm/dl — ABNORMAL HIGH (ref 6.4–8.2)

## 2020-05-07 MED ORDER — SUCRALFATE 1 GRAM TAB
1 gram | ORAL_TABLET | Freq: Four times a day (QID) | ORAL | 0 refills | Status: DC
Start: 2020-05-07 — End: 2020-11-23

## 2020-05-07 MED ORDER — SODIUM CHLORIDE 0.9 % INJECTION
40 mg | Freq: Once | INTRAMUSCULAR | Status: AC
Start: 2020-05-07 — End: 2020-05-07
  Administered 2020-05-07: 05:00:00 via INTRAVENOUS

## 2020-05-07 MED ORDER — SODIUM CHLORIDE 0.9% BOLUS IV
0.9 % | INTRAVENOUS | Status: AC
Start: 2020-05-07 — End: 2020-05-07
  Administered 2020-05-07: 05:00:00 via INTRAVENOUS

## 2020-05-07 MED ORDER — ONDANSETRON 4 MG TAB, RAPID DISSOLVE
4 mg | ORAL_TABLET | Freq: Three times a day (TID) | ORAL | 0 refills | Status: AC | PRN
Start: 2020-05-07 — End: 2020-11-23

## 2020-05-07 MED ORDER — LORAZEPAM 2 MG/ML IJ SOLN
2 mg/mL | INTRAMUSCULAR | Status: DC | PRN
Start: 2020-05-07 — End: 2020-05-07

## 2020-05-07 MED ORDER — ONDANSETRON (PF) 4 MG/2 ML INJECTION
4 mg/2 mL | Freq: Once | INTRAMUSCULAR | Status: AC
Start: 2020-05-07 — End: 2020-05-07
  Administered 2020-05-07: 08:00:00 via INTRAVENOUS

## 2020-05-07 MED ORDER — SODIUM CHLORIDE 0.9% BOLUS IV
0.9 % | INTRAVENOUS | Status: AC
Start: 2020-05-07 — End: 2020-05-07
  Administered 2020-05-07: 06:00:00 via INTRAVENOUS

## 2020-05-07 MED ORDER — HYDROMORPHONE 1 MG/ML INJECTION SOLUTION
1 mg/mL | INTRAMUSCULAR | Status: AC
Start: 2020-05-07 — End: 2020-05-07
  Administered 2020-05-07: 05:00:00 via INTRAVENOUS

## 2020-05-07 MED ORDER — ACETAMINOPHEN 1,000 MG/100 ML (10 MG/ML) IV
1000 mg/100 mL (10 mg/mL) | Freq: Once | INTRAVENOUS | Status: AC
Start: 2020-05-07 — End: 2020-05-07
  Administered 2020-05-07: 08:00:00 via INTRAVENOUS

## 2020-05-07 MED ORDER — LIDOCAINE 2 % MUCOSAL SOLN
2 % | Status: AC
Start: 2020-05-07 — End: 2020-05-07
  Administered 2020-05-07: 08:00:00 via OROMUCOSAL

## 2020-05-07 MED ORDER — ONDANSETRON (PF) 4 MG/2 ML INJECTION
4 mg/2 mL | Freq: Once | INTRAMUSCULAR | Status: AC
Start: 2020-05-07 — End: 2020-05-07
  Administered 2020-05-07: 05:00:00 via INTRAVENOUS

## 2020-05-07 MED ORDER — SUCRALFATE 100 MG/ML ORAL SUSP
100 mg/mL | ORAL | Status: AC
Start: 2020-05-07 — End: 2020-05-07
  Administered 2020-05-07: 08:00:00 via ORAL

## 2020-05-07 MED ORDER — LORAZEPAM 1 MG TAB
1 mg | ORAL | Status: DC | PRN
Start: 2020-05-07 — End: 2020-05-07

## 2020-05-07 MED ORDER — IOPAMIDOL 61 % IV SOLN
61 % | Freq: Once | INTRAVENOUS | Status: AC
Start: 2020-05-07 — End: 2020-05-07
  Administered 2020-05-07: 06:00:00 via INTRAVENOUS

## 2020-05-07 MED ORDER — DICYCLOMINE 20 MG TAB
20 mg | ORAL_TABLET | Freq: Four times a day (QID) | ORAL | 0 refills | Status: AC
Start: 2020-05-07 — End: 2020-05-12

## 2020-05-07 MED ORDER — FAMOTIDINE 20 MG TAB
20 mg | ORAL_TABLET | Freq: Two times a day (BID) | ORAL | 0 refills | Status: AC
Start: 2020-05-07 — End: 2020-05-17

## 2020-05-07 MED ORDER — ALUM-MAG HYDROXIDE-SIMETH 400 MG-400 MG-40 MG/5 ML ORAL SUSP
400-400-40 mg/5 mL | ORAL | Status: AC
Start: 2020-05-07 — End: 2020-05-07
  Administered 2020-05-07: 08:00:00 via ORAL

## 2020-05-07 MED ORDER — ONDANSETRON (PF) 4 MG/2 ML INJECTION
4 mg/2 mL | INTRAMUSCULAR | Status: AC
Start: 2020-05-07 — End: 2020-05-07
  Administered 2020-05-07: 08:00:00 via INTRAVENOUS

## 2020-05-07 MED FILL — ACETAMINOPHEN 1,000 MG/100 ML (10 MG/ML) IV: 1000 mg/100 mL (10 mg/mL) | INTRAVENOUS | Qty: 100

## 2020-05-07 MED FILL — SUCRALFATE 100 MG/ML ORAL SUSP: 100 mg/mL | ORAL | Qty: 10

## 2020-05-07 MED FILL — ISOVUE-300  61 % INTRAVENOUS SOLUTION: 300 mg iodine /mL (61 %) | INTRAVENOUS | Qty: 100

## 2020-05-07 MED FILL — PANTOPRAZOLE 40 MG IV SOLR: 40 mg | INTRAVENOUS | Qty: 40

## 2020-05-07 MED FILL — ONDANSETRON (PF) 4 MG/2 ML INJECTION: 4 mg/2 mL | INTRAMUSCULAR | Qty: 2

## 2020-05-07 MED FILL — HYDROMORPHONE 1 MG/ML INJECTION SOLUTION: 1 mg/mL | INTRAMUSCULAR | Qty: 1

## 2020-05-07 MED FILL — MAG-AL PLUS EXTRA STRENGTH 400 MG-400 MG-40 MG/5 ML ORAL SUSPENSION: 400-400-40 mg/5 mL | ORAL | Qty: 30

## 2020-05-07 MED FILL — LIDOCAINE 2 % MUCOSAL SOLN: 2 % | Qty: 15

## 2020-05-07 NOTE — ED Provider Notes (Signed)
ED Provider Notes by Owens Loffler, PA-C at 05/07/20 4970                Author: Owens Loffler, PA-C  Service: EMERGENCY  Author Type: Physician Assistant       Filed: 05/07/20 0442  Date of Service: 05/07/20 0059  Status: Attested           Editor: Haze Boyden (Physician Assistant)  Cosigner: Hortense Ramal, MD at 05/07/20 1003          Attestation signed by Hortense Ramal, MD at 05/07/20 1003          I provided a substantive portion of the care of this patient and performed the    Medical Decision Making.   Patient with history alcohol abuse, pancreatitis presents 3 day upper abdominal pain.      Findings today   Lab   WBC 11.7.   LFT not elevated   High sensitivity troponin negative   ETOH 6.7   UDS positive marijuana, opiates.  Pregnancy negative.  UA shows dehydration specific gravity hight.   VBG normal pH 7.36      Radiology:  Preliminary chest xray visualized and interpreted by me, Truddie Crumble MD, showed clear lungs      CT abdomen no pancreaittis.  Hypoattenuating lesion pancreatic head increased size - most likely retention cyst or pseudocyst - recommencd MRI to evaluate.      Patient treated with GI cocktain, IVFs, Zofran, Ativan IV, dilaudid 1 mg IV, IVFs   .      Patient pain much improved and tolerating orals.  Patient appears stable for outpatient management and PCPGI follow-up.  Signs and symptoms to return to ED discussed and patient comfortable with discharge plan.      Truddie Crumble MD   Dragon medical dictation software was used for portions of this report. Unintended voice recognition errors may occur.                                   [facility]   Emergency Department Treatment Report          Patient: Rhonda Hammond  Age: 30 y.o.  Sex: female          Date of Birth: 1990/07/29  Admit Date: 05/07/2020  PCP: None     MRN: [id number]   CSN: [id number]   Attending:  Hortense Ramal, MD         Room: ER23/ER23  Time Dictated: 12:59 AM  APP:  Johnella Moloney            Chief Complaint      Chief Complaint       Patient presents with        ?  Abdominal Pain          History of Present Illness     30 y.o. female  with a history of alcohol abuse, alcohol induced pancreatitis, cocaine abuse and gastritis, presents to the ED with 3 days of upper abdominal pain.  Patient says she has vomiting, says she has been seeing blood in her emesis.  Denies any bloody or black  tarry stools.  Patient says she recently went out drinking with friends.  She thinks she has pancreatitis.  Denies fever or chills but is complaining of shortness of breath and chest pain.  Patient tells me she has never  had a seizure from not drinking.        Review of Systems     Constitutional:  No fever or chills   Eyes: No eye redness or drainage.   ENT: No sore throat, runny nose.   Respiratory: No cough or wheezing.   Cardiovascular: See HPI   Gastrointestinal: See HPI   Genitourinary: No dysuria, frequency, or urgency.   Musculoskeletal: No joint pain or swelling.   Integumentary: No rashes.   Neurological: No headaches, focal sensory or motor symptoms.   Denies complaints in all other systems.        Past Medical/Surgical History          Past Medical History:        Diagnosis  Date         ?  Alcohol abuse       ?  Alcohol-induced chronic pancreatitis ([place])  01/28/2020     ?  Cocaine abuse ([place])  06/2019     ?  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  Pancreatitis            Past Surgical History:         Procedure  Laterality  Date          ?  HX CESAREAN SECTION                 Social History          Social History          Socioeconomic History         ?  Marital status:  SINGLE       Tobacco Use         ?  Smoking status:  Current Every Day Smoker       Substance and Sexual Activity         ?  Alcohol use:  Yes             Family History          Family History         Problem  Relation  Age of Onset          ?  No Known Problems  Mother               Home Medications          None              Allergies     No Known Allergies        Physical Exam          ED Triage Vitals [05/06/20 2230]     ED Encounter Vitals Group           BP  118/78        Pulse (Heart Rate)  98        Resp Rate  18        Temp  97.2 ??F (36.2 ??C)        Temp src          O2 Sat (%)  98 %        Weight  210 lb           Height  5' 7"         Constitutional: Patient appears well developed and well nourished. Appearance and behavior are age and situation appropriate.   HEENT: Conjunctiva clear.  PERRLA. Mucous membranes  moist, non-erythematous.    Neck: Symmetrical, no masses. Normal range of motion.   Respiratory: Lungs clear to auscultation, nonlabored respirations. No tachypnea or accessory muscle use.   Cardiovascular: Heart regular rate and rhythm without murmur rubs or gallops.     Gastrointestinal:  Abdomen soft, diffusely tender to palpation.  Musculoskeletal:  No deformities of the limbs.   Integumentary: Warm and dry without rashes or lesions   Neurologic: Alert and moving all limbs spontaneously.      Type(s) of Personal Protection Equipment (PPE) worn during exam:  Surgical mask, gloves     Impression and Management Plan     30 year old female with history of pancreatitis, here with what she says is upper abdominal pain, diffuse tenderness on exam.  Chest pain and trouble breathing.  Labs ordered prior  to my evaluation.  Differential includes pancreatitis, gastritis, Mallory-Weiss tear, cholecystitis, choledocholithiasis, colitis, alcoholic ketoacidosis.  Will pain chest x-ray, EKG, CT of the chest.  Will treat with pain meds, Protonix, fluids.     Diagnostic Studies        EKG: Sinus rhythm with sinus arrhythmia with occasional PVCs.  Ventricular rate 77 bpm.  No acute ischemic changes as interpreted by Dr. Rigoberto Noel.            Lab:      Recent Results (from the past 24 hour(s))     CBC WITH AUTOMATED DIFF          Collection Time: 05/06/20 11:50 PM         Result  Value  Ref Range            WBC  11.7 (H)  4.0 - 11.0  1000/mm3       RBC  4.54  3.60 - 5.20 M/uL       HGB  12.3 (L)  13.0 - 17.2 gm/dl       HCT  35.3 (L)  37.0 - 50.0 %       MCV  77.8 (L)  80.0 - 98.0 fL       MCH  27.1  25.4 - 34.6 pg       MCHC  34.8  30.0 - 36.0 gm/dl       PLATELET  295  140 - 450 1000/mm3       MPV  11.0 (H)  6.0 - 10.0 fL       RDW-SD  47.7 (H)  36.4 - 46.3         NRBC  0  0 - 0         IMMATURE GRANULOCYTES  0.9  0.0 - 3.0 %       NEUTROPHILS  78.1 (H)  34 - 64 %       LYMPHOCYTES  13.8 (L)  28 - 48 %       MONOCYTES  6.7  1 - 13 %       EOSINOPHILS  0.3  0 - 5 %       BASOPHILS  0.2  0 - 3 %       LIPASE          Collection Time: 05/06/20 11:50 PM         Result  Value  Ref Range            Lipase  41  12 - 53 U/L       METABOLIC PANEL, COMPREHENSIVE          Collection Time: 05/06/20  11:50 PM         Result  Value  Ref Range            Potassium  3.3 (L)  3.5 - 5.1 mEq/L       Chloride  103  98 - 107 mEq/L       Sodium  139  136 - 145 mEq/L       CO2  17 (L)  21 - 32 mEq/L       Glucose  144 (H)  74 - 106 mg/dl       BUN  <5 (L)  7 - 25 mg/dl       Creatinine  0.8  0.6 - 1.3 mg/dl       GFR est AA  >60.0          GFR est non-AA  >60          Calcium  10.2 (H)  8.5 - 10.1 mg/dl       AST (SGOT)  20  15 - 37 U/L       ALT (SGPT)  14  12 - 78 U/L       Alk. phosphatase  70  45 - 117 U/L       Bilirubin, total  0.3  0.2 - 1.0 mg/dl       Protein, total  8.8 (H)  6.4 - 8.2 gm/dl       Albumin  4.6  3.4 - 5.0 gm/dl       Anion gap  19 (H)  5 - 15 mmol/L       TROPONIN-HIGH SENSITIVITY          Collection Time: 05/07/20  1:15 AM         Result  Value  Ref Range            Troponin-High Sensitivity  <3  0 - 59 ng/L       ETHYL ALCOHOL          Collection Time: 05/07/20  1:38 AM         Result  Value  Ref Range            ALCOHOL(ETHYL),SERUM  6.7  0.0 - 9.0 mg/dl       DRUG SCREEN, URINE          Collection Time: 05/07/20  2:00 AM         Result  Value  Ref Range            Amphetamine  NEGATIVE  NEGATIVE         Barbiturates  NEGATIVE  NEGATIVE          Benzodiazepines  NEGATIVE  NEGATIVE         Cocaine  NEGATIVE  NEGATIVE         Marijuana  POSITIVE (A)  NEGATIVE         Methadone  NEGATIVE  NEGATIVE         Opiates  POSITIVE (A)  NEGATIVE         Phencyclidine  NEGATIVE  NEGATIVE         POC HCG,URINE          Collection Time: 05/07/20  2:13 AM         Result  Value  Ref Range            HCG urine, QL  negative  NEGATIVE,Negative,negative         POC URINE  MACROSCOPIC          Collection Time: 05/07/20  2:14 AM         Result  Value  Ref Range            Glucose  100 (A)  NEGATIVE,Negative mg/dl       Bilirubin  Small (A)  NEGATIVE,Negative         Ketone  15 (A)  NEGATIVE,Negative mg/dl       Specific gravity  >=1.030  1.005 - 1.030         Blood  Negative  NEGATIVE,Negative         pH (UA)  5.5  5 - 9         Protein  >=300 (A)  NEGATIVE,Negative mg/dl       Urobilinogen  0.2  0.0 - 1.0 EU/dl       Nitrites  Negative  NEGATIVE,Negative         Leukocyte Esterase  Negative  NEGATIVE,Negative         Color  Amber          Appearance  Cloudy          POC BLOOD GAS          Collection Time: 05/07/20  3:25 AM         Result  Value  Ref Range            pH  7.360  7.310 - 7.410         PCO2  47.8  41.0 - 51.0 mm Hg       PO2  68.0 (H)  35 - 40 mm Hg       BICARBONATE  27.0 (H)  22.0 - 26.0 mmol/L       O2 SAT  92.0 (H)  70 - 75 %       CO2, TOTAL  28  21 - 32 mmol/L       BASE EXCESS  2  -2 - 3 mmol/L            Sample type  VEN              Preliminary imaging interpretation:    Chest x-ray shows no acute cardiopulmonary disease as interpreted by me.      Imaging:     XR CHEST PA LAT      Result Date: 05/07/2020   INDICATION: Shortness of breath and chest pain; COMPARISON: None. TECHNIQUE: Frontal and lateral views of the chest. FINDINGS: No consolidation, pleural effusion, or pulmonary edema. No pneumothorax. Normal cardiomediastinal contours. Thoracic spine is  normal for age.       IMPRESSION: No acute cardiopulmonary process.       CT ABD PELV W CONT       Result Date: 05/07/2020   INDICATION: For abdominal pain, history of pancreatitis, concern for the same; COMPARISON: 02/25/2020. TECHNIQUE: CT of the abdomen and pelvis with intravenous contrast. Coronal and sagittal reformatted images were produced. All CT exams at this facility  use one or more dose reduction techniques including automatic exposure control, mA/kV adjustment per patient's size, or iterative reconstruction technique. CONTRAST: 100 mL Isovue-300. FINDINGS: LOWER THORAX: Normal. HEPATOBILIARY: No focal hepatic lesions.   The gallbladder is unremarkable. No biliary duct dilatation. PANCREAS: No infiltrative changes surrounding the pancreas. There is a hypoattenuating lesion in the pancreatic head measuring 1.8 x 1.4 cm. This has increased in size from the prior study.  SPLEEN: No  splenomegaly. ADRENALS: No adrenal nodules. KIDNEYS/URETERS: No hydronephrosis, stones, or solid mass lesions. PELVIC ORGANS/BLADDER: Unremarkable. PERITONEUM / RETROPERITONEUM: No free air or fluid. LYMPH NODES: No enlarged mesenteric, retroperitoneal,  or pelvic lymph nodes. VESSELS: Unremarkable. GI TRACT: Normal caliber with no obstruction. No wall thickening. Normal appendix. BONES AND SOFT TISSUES: Unremarkable.       IMPRESSION: 1.  No evidence of acute pancreatitis by CT. Correlation with laboratory findings recommended. 2.  Hypoattenuating lesion in the pancreatic head, increased in size from the prior study. Statistically, this is most likely a pancreatic retention  cyst or pseudocyst. While neoplasm is considered unlikely, confirmation with nonemergent MRI of the abdomen with and without contrast is recommended.         ED Course          ED Course as of 05/07/20 0439       Fri May 07, 2020        0159  Rectal exam performed by me, stool is brown and guaiac negative. [SG]     [id number]  Tolerating ice chips [SG]        0337  With pain, nausea.  No pancreatitis based on labs and CT abd/pelv.  Likely gastritis.  VBG shows no  acidosis.  Will give ofirmev, GI cocktail.  [SG]        [id number]  Patient feeling better, ambulated to the bathroom.  Wants ginger ale. [SG]              ED Course User Index   [SG] Owens Loffler, PA-C           Patient received the following medications during stay:     Medications       LORazepam (ATIVAN) tablet 1 mg (has no administration in time range)       Or     LORazepam (ATIVAN) injection 1 mg (has no administration in time range)     LORazepam (ATIVAN) tablet 2 mg (has no administration in time range)       Or     LORazepam (ATIVAN) injection 2 mg (has no administration in time range)     LORazepam (ATIVAN) injection 3 mg (has no administration in time range)     sodium chloride 0.9 % bolus infusion 1,000 mL (0 mL IntraVENous IV Completed 05/07/20 0156)     ondansetron (ZOFRAN) injection 4 mg (4 mg IntraVENous Given 05/07/20 0108)     HYDROmorphone (DILAUDID) injection 1 mg (1 mg IntraVENous Given 05/07/20 0108)     pantoprazole (PROTONIX) 40 mg in 0.9% sodium chloride 10 mL injection (40 mg IntraVENous Given 05/07/20 0108)     sodium chloride 0.9 % bolus infusion 1,000 mL (1,000 mL IntraVENous New Bag 05/07/20 0200)     iopamidoL (ISOVUE 300) 61 % contrast injection 100 mL (100 mL IntraVENous Given 05/07/20 0220)     ondansetron (ZOFRAN) injection 4 mg (4 mg IntraVENous Given 05/07/20 0332)     ondansetron (ZOFRAN) injection 4 mg (4 mg IntraVENous Given 05/07/20 0334)     acetaminophen (OFIRMEV) infusion 1,000 mg (1,000 mg IntraVENous New Bag 05/07/20 0351)     aluminum & magnesium hydroxide-simethicone (MYLANTA II) oral suspension 30 mL (30 mL Oral Given 05/07/20 0351)     sucralfate (CARAFATE) 100 mg/mL oral suspension 1 g (1 g Oral Given 05/07/20 0351)       lidocaine (XYLOCAINE) 2 % viscous solution 10 mL (10 mL Mouth/Throat Given 05/07/20 0351)  Most recent vital signs:   Visit Vitals      BP  136/75     Pulse  98     Temp  97.2 ??F (36.2 ??C)     Resp  18     Ht  5' 7"  (1.702 m)     Wt  95.3 kg (210  lb)     SpO2  100%        BMI  32.89 kg/m??           Patient walking around ED without difficulty, appears to not be in any pain.     Medical Decision Making        Patient will be discharged with the following medication prescriptions:     Current Discharge Medication List              START taking these medications          Details        sucralfate (Carafate) 1 gram tablet  Take 1 Tablet by mouth four (4) times daily.   Qty: 20 Tablet, Refills:  0   Start date: 05/07/2020               dicyclomine (BENTYL) 20 mg tablet  Take 1 Tablet by mouth every six (6) hours for 5 days.   Qty: 20 Tablet, Refills:  0   Start date: 05/07/2020, End date:  05/12/2020               famotidine (Pepcid) 20 mg tablet  Take 1 Tablet by mouth two (2) times a day for 10 days.   Qty: 20 Tablet, Refills:  0   Start date: 05/07/2020, End date:  05/17/2020               ondansetron (ZOFRAN ODT) 4 mg disintegrating tablet  Take 1 Tablet by mouth every eight (8) hours as needed for Nausea or Vomiting.   Qty: 20 Tablet, Refills:  0   Start date: 05/07/2020                         CBC with a slight leukocytosis of 11.7.  Chemistry reveals normal sodium, potassium low at 3.3, CO2 17 and anion gap 19.  VBG showed normal pH.  Creatinine 0.8, blood sugar 144.  LFTs, T bili are normal.  Lipase normal at 41.  High-sensitivity troponin  less than 3 and EKG without acute abnormality.  Chest x-ray normal.      CT of the abdomen and pelvis showed no acute evidence of pancreatitis.  There is a hypoattenuating lesion in the pancreatic head, increased in size from prior study, most statistically pancreatic retention cyst or pseudocyst, neoplasm considered unlikely.   Nonemergent MRI of the abdomen with and without contrast recommended.      Patient ultimately tolerating p.o.  Feels better after IV fluids, Zofran, Ofirmev, GI cocktail.  I think patient is safe for discharge.  Discussed all findings with the patient and stressed the importance of following up with  GI.      Patient instructed to return to the ED for any new or worsening symptoms.        Final Diagnosis           1.  Abdominal pain, generalized      2.  Dehydration      3.  Intractable vomiting with nausea         4.  Acute alcoholic  gastritis with hemorrhage              Disposition     The patient was discharged in stable condition will follow-up as outlined above.         The patient was personally evaluated by myself and discussed with [facility], Acquanetta Belling, MD who agrees with the above assessment and plan.         Owens Loffler, PA-C   May 07, 2020      My signature above authenticates this document and my orders, the final     diagnosis (es), discharge prescription (s), and instructions in the Epic     record.   If you have any questions please contact [phone].       Nursing notes have been reviewed by the physician/ advanced practice     Clinician.

## 2020-05-07 NOTE — ED Notes (Signed)
2 PIV's removed, tips in tack    4:56 AM  05/07/20     Discharge instructions given to patient (name) with verbalization of understanding. Patient accompanied by self.  Patient discharged with the following prescriptions none. Patient discharged to home (destination).      Clovis Pu, RN

## 2020-05-12 LAB — EKG, 12 LEAD, INITIAL
Atrial Rate: 89 {beats}/min
Calculated P Axis: 67 degrees
Calculated R Axis: 66 degrees
Calculated T Axis: 30 degrees
P-R Interval: 146 ms
Q-T Interval: 408 ms
QRS Duration: 92 ms
QTC Calculation (Bezet): 461 ms
Ventricular Rate: 77 {beats}/min

## 2020-05-12 LAB — EKG 12-LEAD
Atrial Rate: 89 {beats}/min
P Axis: 67 degrees
P-R Interval: 146 ms
Q-T Interval: 408 ms
QRS Duration: 92 ms
QTc Calculation (Bazett): 461 ms
R Axis: 66 degrees
T Axis: 30 degrees
Ventricular Rate: 77 {beats}/min

## 2020-11-18 DIAGNOSIS — K859 Acute pancreatitis without necrosis or infection, unspecified: Secondary | ICD-10-CM

## 2020-11-18 DIAGNOSIS — K852 Alcohol induced acute pancreatitis without necrosis or infection: Secondary | ICD-10-CM

## 2020-11-18 NOTE — ED Notes (Signed)
Pt arrived via EMS from home c/ abdominal pain. Pt states she has a history of pancreatitis and has been vomiting. Denies any diarrhea at this time.

## 2020-11-18 NOTE — ED Notes (Signed)
Called to be seen, no answer. x3

## 2020-11-19 ENCOUNTER — Inpatient Hospital Stay
Admit: 2020-11-19 | Discharge: 2020-11-23 | Disposition: A | Discharge status: Home / Self Care | Payer: PRIVATE HEALTH INSURANCE | Attending: Internal Medicine | Admitting: Internal Medicine

## 2020-11-19 LAB — METABOLIC PANEL, COMPREHENSIVE
ALT (SGPT): 20 U/L (ref 10–49)
AST (SGOT): 28 U/L (ref 0.0–33.9)
Albumin: 4.3 gm/dl (ref 3.4–5.0)
Alk. phosphatase: 4 U/L — ABNORMAL LOW (ref 46–116)
Anion gap: 10 mmol/L (ref 5–15)
BUN: 8 mg/dl — ABNORMAL LOW (ref 9–23)
Bilirubin, total: 0.9 mg/dl (ref 0.30–1.20)
CO2: 24 mEq/L (ref 20–31)
Calcium: 9.7 mg/dl (ref 8.7–10.4)
Chloride: 103 mEq/L (ref 98–107)
Creatinine: 0.75 mg/dl (ref 0.55–1.02)
GFR est AA: >60
GFR est non-AA: >60
Glucose: 123 mg/dl — ABNORMAL HIGH (ref 74–106)
Potassium: 3.4 mEq/L — ABNORMAL LOW (ref 3.5–5.1)
Protein, total: 8.1 gm/dl (ref 5.7–8.2)
Sodium: 137 mEq/L (ref 136–145)

## 2020-11-19 LAB — CBC WITH AUTOMATED DIFF
BASOPHILS: 0.3 % (ref 0–3)
EOSINOPHILS: 0.6 % (ref 0–5)
HCT: 31.4 % — ABNORMAL LOW (ref 37.0–50.0)
HGB: 11.4 gm/dl — ABNORMAL LOW (ref 13.0–17.2)
IMMATURE GRANULOCYTES: 0.5 % (ref 0.0–3.0)
LYMPHOCYTES: 8.2 % — ABNORMAL LOW (ref 28–48)
MCH: 28.3 pg (ref 25.4–34.6)
MCHC: 36.3 gm/dl — ABNORMAL HIGH (ref 30.0–36.0)
MCV: 77.9 fL — ABNORMAL LOW (ref 80.0–98.0)
MONOCYTES: 6 % (ref 1–13)
MPV: 11.1 fL — ABNORMAL HIGH (ref 6.0–10.0)
NEUTROPHILS: 84.4 % — ABNORMAL HIGH (ref 34–64)
NRBC: 1 — ABNORMAL HIGH (ref 0–0)
PLATELET: 256 10*3/uL (ref 140–450)
RBC: 4.03 M/uL (ref 3.60–5.20)
RDW-SD: 46.7 — ABNORMAL HIGH (ref 36.4–46.3)
WBC: 11.8 10*3/uL — ABNORMAL HIGH (ref 4.0–11.0)

## 2020-11-19 LAB — ETHYL ALCOHOL
ALCOHOL(ETHYL),SERUM: NEGATIVE mg/dl (ref 0.0–3.0)
Ethyl Alcohol: NEGATIVE mg/dl (ref 0.0–3.0)

## 2020-11-19 LAB — DRUG SCREEN, URINE
Amphetamine: NEGATIVE
Amphetamines: NEGATIVE
Barbiturates: NEGATIVE
Barbiturates: NEGATIVE
Benzodiazapines: NEGATIVE
Benzodiazepines: NEGATIVE
Cocaine: NEGATIVE
Cocaine: NEGATIVE
Marijuana: POSITIVE — AB
Marijuana: POSITIVE — AB
Methadone: NEGATIVE
Methadone: NEGATIVE
Opiates: POSITIVE — AB
Opiates: POSITIVE — AB
Phencyclidine: NEGATIVE
Phencyclidine: NEGATIVE

## 2020-11-19 LAB — LIPASE
Lipase: 1248 U/L — ABNORMAL HIGH (ref 12–53)
Lipase: 1248 U/L — ABNORMAL HIGH (ref 12–53)

## 2020-11-19 LAB — POC HCG,URINE
HCG urine, QL: NEGATIVE
Pregnancy Test(Urn): NEGATIVE

## 2020-11-19 LAB — CBC WITH AUTO DIFFERENTIAL
Basophils %: 0.3 % (ref 0–3)
Eosinophils %: 0.6 % (ref 0–5)
Hematocrit: 31.4 % — ABNORMAL LOW (ref 37.0–50.0)
Hemoglobin: 11.4 gm/dl — ABNORMAL LOW (ref 13.0–17.2)
Immature Granulocytes: 0.5 % (ref 0.0–3.0)
Lymphocytes %: 8.2 % — ABNORMAL LOW (ref 28–48)
MCH: 28.3 pg (ref 25.4–34.6)
MCHC: 36.3 gm/dl — ABNORMAL HIGH (ref 30.0–36.0)
MCV: 77.9 fL — ABNORMAL LOW (ref 80.0–98.0)
MPV: 11.1 fL — ABNORMAL HIGH (ref 6.0–10.0)
Monocytes %: 6 % (ref 1–13)
Neutrophils %: 84.4 % — ABNORMAL HIGH (ref 34–64)
Nucleated RBCs: 1 — ABNORMAL HIGH (ref 0–0)
Platelets: 256 10*3/uL (ref 140–450)
RBC: 4.03 M/uL (ref 3.60–5.20)
RDW-SD: 46.7 — ABNORMAL HIGH (ref 36.4–46.3)
WBC: 11.8 10*3/uL — ABNORMAL HIGH (ref 4.0–11.0)

## 2020-11-19 LAB — COMPREHENSIVE METABOLIC PANEL
ALT: 20 U/L (ref 10–49)
AST: 28 U/L (ref 0.0–33.9)
Albumin: 4.3 gm/dl (ref 3.4–5.0)
Alkaline Phosphatase: 4 U/L — ABNORMAL LOW (ref 46–116)
Anion Gap: 10 mmol/L (ref 5–15)
BUN: 8 mg/dl — ABNORMAL LOW (ref 9–23)
CO2: 24 mEq/L (ref 20–31)
Calcium: 9.7 mg/dl (ref 8.7–10.4)
Chloride: 103 mEq/L (ref 98–107)
Creatinine: 0.75 mg/dl (ref 0.55–1.02)
EGFR IF NonAfrican American: >60
GFR African American: >60
Glucose: 123 mg/dl — ABNORMAL HIGH (ref 74–106)
Potassium: 3.4 mEq/L — ABNORMAL LOW (ref 3.5–5.1)
Sodium: 137 mEq/L (ref 136–145)
Total Bilirubin: 0.9 mg/dl (ref 0.30–1.20)
Total Protein: 8.1 gm/dl (ref 5.7–8.2)

## 2020-11-19 MED ADMIN — diphenhydrAMINE (BENADRYL) injection 50 mg: INTRAVENOUS | @ 11:00:00 | NDC 63323066400

## 2020-11-19 MED ADMIN — heparin (porcine) injection 5,000 Units: SUBCUTANEOUS | @ 17:00:00 | NDC 71288042295

## 2020-11-19 MED ADMIN — piperacillin-tazobactam (ZOSYN) 3.375 g in 0.9% sodium chloride (MBP/ADV) 100 mL MBP: INTRAVENOUS | @ 23:00:00 | NDC 63323098341

## 2020-11-19 MED ADMIN — morphine injection 4 mg: INTRAVENOUS | @ 09:00:00 | NDC 00409189103

## 2020-11-19 MED ADMIN — morphine injection 8 mg: INTRAVENOUS | @ 06:00:00 | NDC 00409189103

## 2020-11-19 MED ADMIN — iopamidoL (ISOVUE 300) 61 % contrast injection 80 mL: INTRAVENOUS | @ 09:00:00 | NDC 00270131535

## 2020-11-19 MED ADMIN — piperacillin-tazobactam (ZOSYN) 4.5 g in 0.9% sodium chloride (MBP/ADV) 100 mL MBP: INTRAVENOUS | @ 09:00:00 | NDC 65219025915

## 2020-11-19 MED ADMIN — ondansetron (ZOFRAN) injection 4 mg: INTRAVENOUS | @ 21:00:00 | NDC 23155054831

## 2020-11-19 MED ADMIN — morphine injection 8 mg: INTRAVENOUS | @ 14:00:00 | NDC 00409189103

## 2020-11-19 MED ADMIN — diphenhydrAMINE (BENADRYL) injection 25 mg: INTRAVENOUS | @ 17:00:00 | NDC 63323066400

## 2020-11-19 MED ADMIN — 0.9% sodium chloride infusion: INTRAVENOUS | @ 11:00:00 | NDC 00338004904

## 2020-11-19 MED ADMIN — promethazine (PHENERGAN) 25 mg in NS IVPB: INTRAVENOUS | @ 06:00:00 | NDC 09999054225

## 2020-11-19 MED ADMIN — dextrose 5% and 0.9% NaCl 1,000 mL with potassium chloride 40 mEq, magnesium sulfate 2 g, folic acid 1 mg, thiamine 100 mg, mvi (adult no.4 with vit K) 10 mL infusion: INTRAVENOUS | @ 13:00:00 | NDC 63323018410

## 2020-11-19 MED ADMIN — sodium chloride 0.9 % bolus infusion 1,000 mL: INTRAVENOUS | @ 09:00:00 | NDC 00338004904

## 2020-11-19 MED ADMIN — HYDROmorphone (DILAUDID) injection 1 mg: INTRAVENOUS | @ 10:00:00 | NDC 76045000901

## 2020-11-19 MED ADMIN — piperacillin-tazobactam (ZOSYN) 3.375 g in 0.9% sodium chloride (MBP/ADV) 100 mL MBP: INTRAVENOUS | @ 15:00:00 | NDC 63323098341

## 2020-11-19 MED ADMIN — ondansetron (ZOFRAN) injection 4 mg: INTRAVENOUS | @ 14:00:00 | NDC 23155054831

## 2020-11-19 MED ADMIN — morphine injection 2 mg: INTRAVENOUS | @ 17:00:00 | NDC 00409189003

## 2020-11-19 MED ADMIN — morphine injection 2 mg: INTRAVENOUS | @ 21:00:00 | NDC 00409189003

## 2020-11-19 MED ADMIN — sodium chloride 0.9 % bolus infusion 1,000 mL: INTRAVENOUS | @ 06:00:00 | NDC 00338004904

## 2020-11-19 MED ADMIN — promethazine (PHENERGAN) 25 mg in NS IVPB: INTRAVENOUS | @ 10:00:00 | NDC 09999054225

## 2020-11-19 MED FILL — PROMETHAZINE IN NS 25 MG/50 ML IV PIGGY BAG: 25 mg/50 ml | INTRAVENOUS | Qty: 50

## 2020-11-19 MED FILL — MORPHINE 4 MG/ML SYRINGE: 4 mg/mL | INTRAMUSCULAR | Qty: 2

## 2020-11-19 MED FILL — LORAZEPAM 1 MG TAB: 1 mg | ORAL | Qty: 1

## 2020-11-19 MED FILL — PIPERACILLIN-TAZOBACTAM 3.375 GRAM IV SOLR: 3.375 gram | INTRAVENOUS | Qty: 3.38

## 2020-11-19 MED FILL — ISOVUE-300  61 % INTRAVENOUS SOLUTION: 300 mg iodine /mL (61 %) | INTRAVENOUS | Qty: 80

## 2020-11-19 MED FILL — ONDANSETRON (PF) 4 MG/2 ML INJECTION: 4 mg/2 mL | INTRAMUSCULAR | Qty: 2

## 2020-11-19 MED FILL — DEXTROSE 5% IN NORMAL SALINE IV: INTRAVENOUS | Qty: 1000

## 2020-11-19 MED FILL — DIPHENHYDRAMINE HCL 50 MG/ML IJ SOLN: 50 mg/mL | INTRAMUSCULAR | Qty: 1

## 2020-11-19 MED FILL — HEPARIN (PORCINE) 5,000 UNIT/ML IJ SOLN: 5000 unit/mL | INTRAMUSCULAR | Qty: 1

## 2020-11-19 MED FILL — MORPHINE 2 MG/ML INJECTION: 2 mg/mL | INTRAMUSCULAR | Qty: 1

## 2020-11-19 MED FILL — HYDROMORPHONE 1 MG/ML INJECTION SOLUTION: 1 mg/mL | INTRAMUSCULAR | Qty: 1

## 2020-11-19 MED FILL — SODIUM CHLORIDE 0.9 % IV: INTRAVENOUS | Qty: 1000

## 2020-11-19 MED FILL — MORPHINE 4 MG/ML SYRINGE: 4 mg/mL | INTRAMUSCULAR | Qty: 1

## 2020-11-19 MED FILL — PIPERACILLIN-TAZOBACTAM 4.5 GRAM IV SOLR: 4.5 gram | INTRAVENOUS | Qty: 4.5

## 2020-11-19 NOTE — ED Notes (Signed)
Resting in chair. Taking few ice chips. Pain controlled at this time after med admin. Awaiting admit

## 2020-11-19 NOTE — ED Notes (Signed)
Pt is on floor kneeling before chair.  States this is a position of comfort and she wants pain medication.  CIWA is 11.      Pt is given ativan for CIWA.  PT updated on POC.  White board updated.

## 2020-11-19 NOTE — ED Notes (Signed)
Medicated with benadryl  Educated pt that dilaudid added as an allergy with side effect moderate itching  Verbal order for 8mg  morphine sulfate every 3 hours prn  for severe pain in place of the dilaudid

## 2020-11-19 NOTE — ED Notes (Signed)
 Late note: assumed care of pt who is restless, flipping all  over the bed, pacing the room due to epigastric pain; + vomiting; states pancreatitis. SL inserted, labs drawn; provider tot he bedside and medicated with IVF; post med admin pt able to rest and has been asleep since. RR even and unlabored; IV site s/p

## 2020-11-19 NOTE — ED Notes (Signed)
Continues to have significant itching   No overt hives  Added dilaudid to allergies/side effect itching-moderate  Spoke to [facility] and requested benadryl  VORB for Benadryl 50 mg IV x 1

## 2020-11-19 NOTE — ED Notes (Signed)
Dr Cleotis Nipper in to see pt

## 2020-11-19 NOTE — ED Notes (Signed)
S/p dilaudid 1 mg for pain, + itching, no hives (+ histamine release) pt reports she has had this side effect before and wants to wait to see if the phenergan helps

## 2020-11-19 NOTE — H&P (Signed)
Admission History and Physical    Rhonda Hammond is a 30 y.o. female who is being admitted.  Chief Complaint   Patient presents with    Abdominal Pain     Admission diagnosis: acute on chronic pancreatitis      Impression: Plan   Acute on chronic pancreatitis. NPO status IV fluids ordered. Monitoring lipase trends. PRN pain meds PRN antiemetics  Dehydration due to #1. IV fluids   EtOH and polysubstance dependence. IV MVI thiamine folate K and Mg. CIWA protocol. Abstinence recommended.   Intractable nausea and vomiting due to #1 and cannabinoid effect. 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 She presented to the ED with CO ongoing nausea and vomiting as well as epigastric and back pain similar to previous episodes of pancreatitis. Her symptoms began the day prior to presentation and have been getting worse. Nothing she has tried has made it worse of better. Her lipase is elevated and a CT of the abdomen confirms evidence of inflammation of the pancreas. Admission is recommended for further management.   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     Intractable nausea and vomiting 06/26/2020    Obesity 12/16/2018    Pancreatitis      Past Surgical History:   Procedure Laterality Date    HX CESAREAN SECTION       Social History     Socioeconomic History    Marital status: SINGLE     Spouse name: Not on  file    Number of children: Not on file    Years of education: Not on file    Highest education level: Not on file   Occupational History    Not on file   Tobacco Use    Smoking status: Every Day    Smokeless tobacco: Not on file   Substance and Sexual Activity    Alcohol use: Yes    Drug use: Not on file    Sexual activity: Not on file   Other Topics Concern    Not on file   Social History Narrative    Not on file     Social Determinants of Health     Financial Resource Strain: Not on file   Food Insecurity: Not on file   Transportation Needs: Not on file   Physical Activity: Not on file   Stress: Not on file   Social Connections: Not on file   Intimate Partner Violence: Not on file   Housing Stability: Not on file     Family History   Problem Relation Age of Onset    No Known Problems Mother      Allergies   Allergen Reactions    Dilaudid [Hydromorphone] Itching  Home Medications:     Prior to Admission medications    Medication Sig Start Date End Date Taking? Authorizing Provider   sucralfate (Carafate) 1 gram tablet Take 1 Tablet by mouth four (4) times daily. 05/07/20   Owens Loffler, PA-C   ondansetron (ZOFRAN ODT) 4 mg disintegrating tablet Take 1 Tablet by mouth every eight (8) hours as needed for Nausea or Vomiting. 05/07/20   Owens Loffler, PA-C        Review of Systems:   Constitutional: negative for chills, diaphoresis, malaise/fatigue, night sweats, weakness and weight gain   Skin: negative for itching and rash   HENT: negative for congestion, ear discharge, ear pain, hearing loss, nose bleeds, sore throat, stridor and tinnitus   Eyes: negative for blurred vision, double vision, eye discharge, eye pain, eye redness and photophobia   Cardiovascular: negative for chest pain, claudication, orthopnea, palpitations, paroxysmal nocturnal dyspnea   Respiratory: negative for cough, hemoptysis, shortness of breath, sputum production or wheezing   Gastointestinal: See HPI   Genitourinary: Negative for  dysuria.    Musculoskeletal: negative for back pain, falls, joint pain, myalgias and neck pain   Endo: negative for cold intolerance, heat intolerance, polydipsia and polyuria.   Heme: negative for anemia, easy bleeding and easy bruising   Allergies: negative for environmental allergies, hay fever, hives and rhinorrhea   Neurological: negative for dizziness, focal weakness, level of consciousness, seizures, sensory change, speech change, tingling and tremor   Psychiatric:  negative for depression, hallucinations, insomnia, memory loss, nervous, anxious, POS for substance abuse and  Neg for suicidal ideals       Physical Assessment:   Visit Vitals  BP 118/65 (BP 1 Location: Right upper arm, BP Patient Position: Supine)   Pulse 66   Temp 97.9 ??F (36.6 ??C)   Resp 18   Ht 5' 7"  (1.702 m)   Wt 96.2 kg (212 lb)   SpO2 94%   BMI 33.20 kg/m??         Constitutional: well developed, nourished, no distress and alert and oriented x 3   HENT: atraumatic, nose normal, normocephalic, left exterior ear normal, right external ear normal and oropharynx clear and moist   Eyes: conjunctiva normal, EOM normal and PERRL   Neck: ROM normal, supple, trachea normal and cervical adenopathy   Cardiovascular: heart sounds normal, intact distal pulses, normal rate and regular rhythm   Pulmonary/Chest Wall: breath sounds normal and effort normal   Abdominal: NABS> soft. Tender; no rebound   Genitourinary/Anorectal: deferred   Musculoskeletal: normal ROM   Neurological: awake, alert and oriented x 3 and gait normal   Skin: dry, intact and warm   Psych:  appropriate     CBC w/Diff    Lab Results   Component Value Date/Time    WBC 11.8 (H) 11/19/2020 01:52 AM    WBC 11.7 (H) 05/06/2020 11:50 PM    WBC 8.8 02/25/2020 06:38 PM    Hemoglobin, POC 13.3 01/26/2010 12:53 PM    HGB 11.4 (L) 11/19/2020 01:52 AM    HGB 12.3 (L) 05/06/2020 11:50 PM    HGB 12.4 (L) 02/25/2020 06:38 PM    Hematocrit, POC 39 01/26/2010 12:53 PM    HCT 31.4 (L) 11/19/2020 01:52  AM    HCT 35.3 (L) 05/06/2020 11:50 PM    HCT 35.2 (L) 02/25/2020 06:38 PM    PLATELET 256 11/19/2020 01:52 AM    PLATELET 295 05/06/2020 11:50 PM    PLATELET 226 02/25/2020  06:38 PM    MCV 77.9 (L) 11/19/2020 01:52 AM    MCV 77.8 (L) 05/06/2020 11:50 PM    MCV 77.0 (L) 02/25/2020 06:38 PM       Recent Labs     11/19/20  0152   WBC 11.8*   RBC 4.03   HGB 11.4*   HCT 31.4*   PLT 256   GRANS 84.4*   LYMPH 8.2*   MONOS 6.0   EOS 0.6   BASOS 0.3        Basic Metabolic Profile   Lab Results   Component Value Date/Time    Sodium 137 11/19/2020 01:52 AM    Sodium 139 05/06/2020 11:50 PM    Sodium 137 02/25/2020 06:38 PM    Potassium 3.4 (L) 11/19/2020 01:52 AM    Potassium 3.3 (L) 05/06/2020 11:50 PM    Potassium 3.7 02/25/2020 06:38 PM    Chloride 103 11/19/2020 01:52 AM    Chloride 103 05/06/2020 11:50 PM    Chloride 108 (H) 02/25/2020 06:38 PM    CO2 24 11/19/2020 01:52 AM    CO2 17 (L) 05/06/2020 11:50 PM    CO2 20 (L) 02/25/2020 06:38 PM    CO2, TOTAL 28 05/07/2020 03:25 AM    Anion gap 10 11/19/2020 01:52 AM    Anion gap 19 (H) 05/06/2020 11:50 PM    Anion gap 9 02/25/2020 06:38 PM    Glucose 123 (H) 11/19/2020 01:52 AM    Glucose 144 (H) 05/06/2020 11:50 PM    Glucose 98 02/25/2020 06:38 PM    BUN 8 (L) 11/19/2020 01:52 AM    BUN <5 (L) 05/06/2020 11:50 PM    BUN 7 02/25/2020 06:38 PM    Creatinine 0.75 11/19/2020 01:52 AM    Creatinine 0.8 05/06/2020 11:50 PM    Creatinine 0.6 02/25/2020 06:38 PM    BUN/Creatinine ratio 8 (L) 06/15/2009 12:23 AM    BUN/Creatinine ratio 11 (L) 12/31/2007 02:38 PM    GFR est AA >60.0 11/19/2020 01:52 AM    GFR est AA >60.0 05/06/2020 11:50 PM    GFR est AA >60.0 02/25/2020 06:38 PM    GFR est non-AA >60 11/19/2020 01:52 AM    GFR est non-AA >60 05/06/2020 11:50 PM    GFR est non-AA >60 02/25/2020 06:38 PM    Calcium 9.7 11/19/2020 01:52 AM    Calcium 10.2 (H) 05/06/2020 11:50 PM    Calcium 9.8 02/25/2020 06:38 PM              Coagulation   No results found for: PTP, INR, APTT, INREXT      EKGs   Results for orders placed or performed during the hospital encounter of 11/18/20   CT ABD PELV W CONT    Narrative    EXAMINATION: CT ABD PELV W CONT    CLINICAL INDICATION: epigastric pain, hx pancreatitis    COMPARISON:  05/07/2020;      TECHNIQUE:  All CT exams at this facility use one or more dose reduction  techniques including automatic exposure control, ma/kV  adjustment per patient's  size, or iterative reconstruction technique.     Multiple contiguous axial CT images at 5  mm increments were obtained from the  bases of the lungs to the  pubic symphysis with intravenous contrast.    FINDINGS:    Visualized lungs/chest: Unremarkable.    Hepatobiliary: Unremarkable. Gallbladder normal.    Kidneys/ureters: Unremarkable.    Esophagus/Stomach: Unremarkable.    Adrenal glands:  Unremarkable.    Pancreas: Enlarging pancreatic head pseudocyst 2.6 x 1.8 cm. Previously 1.8 x  1.4 cm. Extensive new peripancreatic fluid.    Spleen: Unremarkable.    Vascular structures:  Unremarkable.    Peritoneum/mesentery/bowel:  Mesenteric fat stranding surrounding the pancreas.  Free fluid about the pancreas.    Retroperitoneal lymph nodes: Unremarkable    Pelvis:  Unremarkable.    Bones: No significant lesions.    Miscellaneous: None.      Impression    IMPRESSION:   1. Acute pancreatitis with increasing pancreatic head pseudocyst 2.6 x 1.8 cm.  Previously 1.8 x 1.4 cm.  2. Otherwise unremarkable exam.    Electronically signed by: Javier Glazier, MD 11/19/2020 4:36 AM EDT     CBC WITH AUTOMATED DIFF   Result Value Ref Range    WBC 11.8 (H) 4.0 - 11.0 1000/mm3    RBC 4.03 3.60 - 5.20 M/uL    HGB 11.4 (L) 13.0 - 17.2 gm/dl    HCT 31.4 (L) 37.0 - 50.0 %    MCV 77.9 (L) 80.0 - 98.0 fL    MCH 28.3 25.4 - 34.6 pg    MCHC 36.3 (H) 30.0 - 36.0 gm/dl    PLATELET 256 140 - 450 1000/mm3    MPV 11.1 (H) 6.0 - 10.0 fL    RDW-SD 46.7 (H) 36.4 - 46.3      NRBC 1 (H) 0 - 0      IMMATURE GRANULOCYTES 0.5 0.0 - 3.0 %    NEUTROPHILS 84.4  (H) 34 - 64 %    LYMPHOCYTES 8.2 (L) 28 - 48 %    MONOCYTES 6.0 1 - 13 %    EOSINOPHILS 0.6 0 - 5 %    BASOPHILS 0.3 0 - 3 %   LIPASE   Result Value Ref Range    Lipase 1,248 (H) 12 - 53 U/L   METABOLIC PANEL, COMPREHENSIVE   Result Value Ref Range    Potassium 3.4 (L) 3.5 - 5.1 mEq/L    Chloride 103 98 - 107 mEq/L    Sodium 137 136 - 145 mEq/L    CO2 24 20 - 31 mEq/L    Glucose 123 (H) 74 - 106 mg/dl    BUN 8 (L) 9 - 23 mg/dl    Creatinine 0.75 0.55 - 1.02 mg/dl    GFR est AA >60.0      GFR est non-AA >60      Calcium 9.7 8.7 - 10.4 mg/dl    Anion gap 10 5 - 15 mmol/L    AST (SGOT) 28.0 0.0 - 33.9 U/L    ALT (SGPT) 20 10 - 49 U/L    Alk. phosphatase 4 (L) 46 - 116 U/L    Bilirubin, total 0.90 0.30 - 1.20 mg/dl    Protein, total 8.1 5.7 - 8.2 gm/dl    Albumin 4.3 3.4 - 5.0 gm/dl   DRUG SCREEN, URINE   Result Value Ref Range    Amphetamine NEGATIVE NEGATIVE      Barbiturates NEGATIVE NEGATIVE      Benzodiazepines NEGATIVE NEGATIVE      Cocaine NEGATIVE NEGATIVE      Marijuana POSITIVE (A) NEGATIVE      Methadone NEGATIVE NEGATIVE      Opiates POSITIVE (A) NEGATIVE      Phencyclidine NEGATIVE NEGATIVE     ETHYL ALCOHOL   Result Value Ref Range    ALCOHOL(ETHYL),SERUM NEGATIVE 0.0 - 3.0 mg/dl   POC HCG,URINE  Result Value Ref Range    HCG urine, QL negative NEGATIVE,Negative,negative          Radiology   CT ABD PELV W CONT    Result Date: 11/19/2020  EXAMINATION: CT ABD PELV W CONT CLINICAL INDICATION: epigastric pain, hx pancreatitis COMPARISON:  05/07/2020;  TECHNIQUE:  All CT exams at this facility use one or more dose reduction techniques including automatic exposure control, ma/kV  adjustment per patient's size, or iterative reconstruction technique. Multiple contiguous axial CT images at 5  mm increments were obtained from the bases of the lungs to the  pubic symphysis with intravenous contrast. FINDINGS: Visualized lungs/chest: Unremarkable. Hepatobiliary: Unremarkable. Gallbladder normal. Kidneys/ureters:  Unremarkable. Esophagus/Stomach: Unremarkable. Adrenal glands: Unremarkable. Pancreas: Enlarging pancreatic head pseudocyst 2.6 x 1.8 cm. Previously 1.8 x 1.4 cm. Extensive new peripancreatic fluid. Spleen: Unremarkable. Vascular structures:  Unremarkable. Peritoneum/mesentery/bowel:  Mesenteric fat stranding surrounding the pancreas. Free fluid about the pancreas. Retroperitoneal lymph nodes: Unremarkable Pelvis:  Unremarkable. Bones: No significant lesions. Miscellaneous: None.     IMPRESSION: 1. Acute pancreatitis with increasing pancreatic head pseudocyst 2.6 x 1.8 cm. Previously 1.8 x 1.4 cm. 2. Otherwise unremarkable exam. Electronically signed by: Javier Glazier, MD 11/19/2020 4:36 AM EDT        Estimated Discharge Date:

## 2020-11-19 NOTE — ED Notes (Signed)
Pt given pain medication for 9/10 pain upper abdomen.      Pt also given heparin.  Offered socks.  Pt declined.

## 2020-11-19 NOTE — ED Notes (Signed)
Pt aware of admission, NPO status for pancreatitis; 2nd liter up infusing wide open; Mso4  4mg  for pain and zosyn 4.5 G. Will give phenergan at 0600. Pt still restless, sattes morphine did not work; will request additional pain meds from provider

## 2020-11-19 NOTE — ED Notes (Signed)
Moved tto OBS room 1 for comfort as has hospital bed

## 2020-11-19 NOTE — ED Notes (Signed)
Medicated for pain and vomiting. Remains NPO

## 2020-11-19 NOTE — Progress Notes (Signed)
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    Previous 4 Admission Dates Admission and Discharge Diagnosis Interventions Barriers Disposition                                 Patient and Family/Caregivers Goals of Care: home    Caregivers Participating in Plan of Care/Discharge Plan with the patient: family    Tentative dc plan: home    Anticipated DME needs for discharge: no    PRESCREENING COMPLETED FOR SNF no*  Will patient qualify for medicaid now or in the next 180 days?  Patient may qualify for medicaid      Has patient had covid vaccine? Y/N   Date and type if not in chart under immunization field  yes moderna 5/22    Does patient have an ACP? n         Does the patient have appropriate clothing available to be worn at discharge?  Family can bring      The patient and care participants are willing to travel n area for discharge facility.  The patient and plan of care participants have been provided with a list of all available Rehab Facilities or Home Health agencies as applicable. CM will follow up with a list of facilities or agencies that are offer acceptance.    CM has disclosed any financial interest that [facility] may have with any facility or agency.    Anticipated Discharge Date: 11/23/20      Barriers to  Healthcare Success/ Readmission Risk Factors: no    Consults:  Palliative Care Consult Recommended: n  Transitional Care Clinic Referral: n  Transitional Nurse Navigator Referral: n  Oncology Navigator Referral: n  SW consulted: n  [facility] (formerly Environmental consultant) Consulted: n  Outside Hospital/Community Resources Referrals and Collaboration: n    Food/Nutrition Needs:   n                   Dietician Consulted: n    RRAT Score: Low Risk            4 Total Score    4 Pt. Coverage (Medicare=5 , Medicaid, or Self-Pay=4)        Criteria that do not apply:    Has Seen PCP in Last 6 Months (Yes=3, No=0)    Married. Living with Significant Other. Assisted Living. LTAC. SNF. or   Rehab    Patient Length of Stay (>5 days = 3)    IP Visits Last 12 Months (1-3=4, 4=9, >4=11)    Charlson Comorbidity Score (Age +  Comorbid Conditions)           PCP: None . How do you get to your doctor appointment self    Specialists:     Dialysis Unit: n    Pharmacy:   name [facility]   Location of [place]  address   Are there any medications that you have trouble paying for n  Any difficulty getting your medication n    DME available at Home: none    Home O2 L Flow:  n            Home O2 Provider: n    Home Environment and Prior Level of Function: Lives at [address] [place]  [place] [address] @HOMEPHONE @. Lives with  family   How many stories is home  1   Steps into home 3  Responsibilities at home include  self    Prior to admission open services: no    Home Health Agency n  Personal Care Agency n    Extended Emergency Contact Information  Primary Emergency Contact: davis,tangia  Mobile Phone: [phone]  Relation: Friend  Secondary Emergency Contact: [facility]  Address: [address]           [place], [place]  Home Phone: [phone]  Relation: Parent     Transportation: family will transport home    Therapy Recommendations:    OT = n    PT = n    SLP =  n     RT Home O2 Evaluation =   n    Wound Care =  n    Case Management Assessment    ABUSE/NEGLECT SCREENING   Physical Abuse/Neglect: Denies   Sexual Abuse: Denies   Sexual Abuse: Denies   Other Abuse/Issues: Denies          PRIMARY DECISION MAKER                                   CARE MANAGEMENT INTERVENTIONS   Readmission Interview Completed: Not Applicable   PCP Verified by CM: Yes           Mode of Transport at Discharge: Other (see comment) (family)                   MyChart Signup: No   Discharge Durable Medical Equipment: No   Physical Therapy Consult: No   Occupational Therapy Consult: No   Speech Therapy Consult: No           History Provided By: Patient   Patient Orientation: Alert and Oriented   Cognition: Alert   Support System Response: Concerned, Cooperative   Previous Living Arrangement: Lives with Family Independent   Home Accessibility: Steps (3)   Prior Functional Level: Independent in ADLs/IADLs   Current Functional Level: Independent in ADLs/IADLs       Can patient return to prior living arrangement: Yes   Ability to make needs known:: Good   Family able to assist with home care needs:: Yes               Types of Needs Identified: Disease Management Education, Treatment Education       Confirm Follow Up Transport: Family   Confirm Transport and Arrange: Yes              DISCHARGE LOCATION

## 2020-11-19 NOTE — ED Provider Notes (Signed)
ED Provider Notes by Hilton Sinclair, PA-C at 11/19/20 0203                Author: Hilton Sinclair, PA-C  Service: Emergency Medicine  Author Type: Physician Assistant       Filed: 11/19/20 0455  Date of Service: 11/19/20 0203  Status: Attested           Editor: Artemio Aly (Physician Assistant)  Cosigner: Hortense Ramal, MD at 11/19/20 [phone]          Attestation signed by Hortense Ramal, MD at 11/19/20 [phone]          I provided a substantive portion of the care of this patient and performed the    Medical Decision Making.   Patient with epigastric pain      Findings today   Lab   Lipase 1,248   CT with acute pancreatitis with increasing pancreatic head pseudocyst      Patient treated with IV phenergan, IV reglan, IV zosyn, IV fluids      Hospitalist to admit to treat acute pancreatitis with IVF, pain control and IV antibiotics      Truddie Crumble MD   Dragon medical dictation software was used for portions of this report. Unintended voice recognition errors may occur.                                [facility]   Emergency Department Treatment Report          Patient: Rhonda Hammond  Age: 30 y.o.  Sex: female          Date of Birth: April 13, 1990  Admit Date: 11/18/2020  PCP: None     MRN: [id number]   CSN: [id number]   Attending: Hortense Ramal, MD         Room: 109/EO09  Time Dictated: 2:03 AM  APP: Hilton Sinclair, MPA, PA-C        Chief Complaint     Chief Complaint       Patient presents with        ?  Abdominal Pain               History of Present Illness     30 y.o. female who presents to the ED with PMH of ETOH-chronic pancreatitis and frequent visits to ER for intractable vomiting who has hx of cannabinoid use, amphetamine use, cocaine  use and continues to drink ETOH who presents to the ER tonight with epigastric pain that radiates to her back that she states is similar to previous pancreatitis .  This started yesterday and timing is described as persistant and worsening.  Patient  states  this is located epigastrium, describes quality as sharp and states that it does radiate to back.  Severity rated as severe.  Nothing makes it worse.  Nothing makes it better.        Associated symptoms: nausea and vomiting           Review of Systems     Review of Systems    Constitutional: Negative.     HENT: Negative.      Eyes: Negative.     Respiratory: Negative.      Cardiovascular: Negative.     Gastrointestinal:  Positive for abdominal pain, nausea  and vomiting.    Genitourinary: Negative.     Musculoskeletal: Negative.  Skin: Negative.     Neurological: Negative.     Endo/Heme/Allergies: Negative.     Psychiatric/Behavioral:  Positive for substance abuse.      All other systems reviewed and are negative.        Past Medical History          Past Medical History:        Diagnosis  Date         ?  Alcohol abuse       ?  Alcohol-induced chronic pancreatitis ([place])  01/28/2020     ?  Cocaine abuse ([place])  06/2019     ?  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  Intractable nausea and vomiting  06/26/2020     ?  Obesity  12/16/2018         ?  Pancreatitis               Surgical History          Past Surgical History:         Procedure  Laterality  Date          ?  HX CESAREAN SECTION                 Family History          Family History         Problem  Relation  Age of Onset          ?  No Known Problems  Mother               Social History     DOMENIQUE QUEST  reports that she has been smoking. She does not have any smokeless tobacco history on file. She reports current alcohol use.         Current Medications          Prior to Admission Medications     Prescriptions  Last Dose  Informant  Patient Reported?  Taking?      ondansetron (ZOFRAN ODT) 4 mg disintegrating tablet      No  No      Sig: Take 1 Tablet by mouth every eight (8) hours as needed for Nausea or Vomiting.      sucralfate (Carafate) 1 gram tablet      No  No      Sig: Take 1 Tablet by mouth four (4) times daily.                Facility-Administered Medications: None             Allergies     No Known Allergies        Physical Exam          ED Triage Vitals [11/18/20 2248]     ED Encounter Vitals Group           BP  (!) 158/90        Pulse (Heart Rate)  100        Resp Rate  18        Temp  97.7 ??F (36.5 ??C)        Temp src          O2 Sat (%)  98 %        Weight  212 lb           Height  5' 7"   Physical Exam   Vitals and nursing note reviewed.    Constitutional:        General: She is in acute distress.       Comments: 30 yrs old female who is well developed, well-nourished and appears stated age.    HENT:       Head: Normocephalic and atraumatic.       Right Ear: External ear normal.       Left Ear: External ear normal.       Nose: Nose normal.    Eyes:       Conjunctiva/sclera: Conjunctivae normal.     Cardiovascular:       Rate and Rhythm: Normal rate and regular rhythm.    Pulmonary:       Effort: Pulmonary effort is normal. No respiratory distress.     Abdominal:       General: There is no distension.       Tenderness: There is abdominal tenderness in the epigastric area .     Musculoskeletal:          General: No deformity. Normal range of motion.       Cervical back: Normal range of motion.    Skin:      General: Skin is warm and dry.    Neurological:       General: No focal deficit present.       Mental Status: She is alert.    Psychiatric:          Mood and Affect: Mood is anxious.            Impression and Management Plan     30 y.o. female presents with abdominal pain:     -  IV with fluids     -  CBC, CMP, and lipase     -  UA     -  antiemetic      -  analgesic     -  CT scan of the patient's abdomen      Differential diagnosis:  Pancreatitis, Cholecystitis, Cholangitis, Hepatitis, Appendicitis, Diverticulitis, Colitis, Gastritis, PUD, Ureterolithiasis, SBO,          Further testing and management based on pts response and evolution of sxms.         Diagnostic Studies     Lab:      Recent Results (from the past  24 hour(s))     CBC WITH AUTOMATED DIFF          Collection Time: 11/19/20  1:52 AM         Result  Value  Ref Range            WBC  11.8 (H)  4.0 - 11.0 1000/mm3       RBC  4.03  3.60 - 5.20 M/uL       HGB  11.4 (L)  13.0 - 17.2 gm/dl       HCT  31.4 (L)  37.0 - 50.0 %       MCV  77.9 (L)  80.0 - 98.0 fL       MCH  28.3  25.4 - 34.6 pg       MCHC  36.3 (H)  30.0 - 36.0 gm/dl       PLATELET  256  140 - 450 1000/mm3       MPV  11.1 (H)  6.0 - 10.0 fL       RDW-SD  46.7 (H)  36.4 -  46.3         NRBC  1 (H)  0 - 0         IMMATURE GRANULOCYTES  0.5  0.0 - 3.0 %       NEUTROPHILS  84.4 (H)  34 - 64 %       LYMPHOCYTES  8.2 (L)  28 - 48 %       MONOCYTES  6.0  1 - 13 %       EOSINOPHILS  0.6  0 - 5 %       BASOPHILS  0.3  0 - 3 %       LIPASE          Collection Time: 11/19/20  1:52 AM         Result  Value  Ref Range            Lipase  1,248 (H)  12 - 53 U/L       METABOLIC PANEL, COMPREHENSIVE          Collection Time: 11/19/20  1:52 AM         Result  Value  Ref Range            Potassium  3.4 (L)  3.5 - 5.1 mEq/L       Chloride  103  98 - 107 mEq/L       Sodium  137  136 - 145 mEq/L       CO2  24  20 - 31 mEq/L       Glucose  123 (H)  74 - 106 mg/dl       BUN  8 (L)  9 - 23 mg/dl       Creatinine  0.75  0.55 - 1.02 mg/dl       GFR est AA  >60.0          GFR est non-AA  >60          Calcium  9.7  8.7 - 10.4 mg/dl       Anion gap  10  5 - 15 mmol/L       AST (SGOT)  28.0  0.0 - 33.9 U/L       ALT (SGPT)  20  10 - 49 U/L       Alk. phosphatase  4 (L)  46 - 116 U/L       Bilirubin, total  0.90  0.30 - 1.20 mg/dl       Protein, total  8.1  5.7 - 8.2 gm/dl       Albumin  4.3  3.4 - 5.0 gm/dl       POC HCG,URINE          Collection Time: 11/19/20  4:11 AM         Result  Value  Ref Range            HCG urine, QL  negative  NEGATIVE,Negative,negative             Imaging:  (All imaging personally reviewed and read by myself in conjunction with HSU, Acquanetta Belling, MD)  My interpretation is pancreatic pseudocyst  with peripancreatic  fluid per my read.  Final overread will be done by the attending radiologist.      CT ABD PELV W CONT      Result Date: 11/19/2020   EXAMINATION: CT ABD PELV W CONT CLINICAL INDICATION: epigastric pain, hx pancreatitis COMPARISON:  05/07/2020;  TECHNIQUE:  All  CT exams at this facility use one or more dose reduction techniques including automatic exposure control, ma/kV  adjustment  per patient's size, or iterative reconstruction technique. Multiple contiguous axial CT images at 5  mm increments were obtained from the bases of the lungs to the  pubic symphysis with intravenous contrast. FINDINGS: Visualized lungs/chest: Unremarkable.  Hepatobiliary: Unremarkable. Gallbladder normal. Kidneys/ureters: Unremarkable. Esophagus/Stomach: Unremarkable. Adrenal glands: Unremarkable. Pancreas: Enlarging pancreatic head pseudocyst 2.6 x 1.8 cm. Previously 1.8 x 1.4 cm. Extensive new peripancreatic  fluid. Spleen: Unremarkable. Vascular structures:  Unremarkable. Peritoneum/mesentery/bowel:  Mesenteric fat stranding surrounding the pancreas. Free fluid about the pancreas. Retroperitoneal lymph nodes: Unremarkable Pelvis:  Unremarkable. Bones: No  significant lesions. Miscellaneous: None.       IMPRESSION: 1. Acute pancreatitis with increasing pancreatic head pseudocyst 2.6 x 1.8 cm. Previously 1.8 x 1.4 cm. 2. Otherwise unremarkable exam. Electronically signed by: Javier Glazier, MD 11/19/2020 4:36 AM EDT               ED Course     Patient Vitals for the past 24 hrs:            Temp  Pulse  Resp  BP  SpO2            11/18/20 2248  97.7 ??F (36.5 ??C)  100  18  (!) 158/90  98 %                    Medications       promethazine (PHENERGAN) 25 mg in NS IVPB (has no administration in time range)     morphine injection 4 mg (has no administration in time range)     sodium chloride 0.9 % bolus infusion 1,000 mL (has no administration in time range)     piperacillin-tazobactam (ZOSYN) 4.5 g in 0.9% sodium chloride (MBP/ADV) 100 mL MBP  (has no administration in time range)     piperacillin-tazobactam (ZOSYN) 3.375 g in 0.9% sodium chloride (MBP/ADV) 100 mL MBP (has no administration in time range)     promethazine (PHENERGAN) 25 mg in NS IVPB (0 mg IntraVENous IV Completed 11/19/20 0230)     morphine injection 8 mg (8 mg IntraVENous Given 11/19/20 0212)     sodium chloride 0.9 % bolus infusion 1,000 mL (0 mL IntraVENous IV Completed 11/19/20 0300)       iopamidoL (ISOVUE 300) 61 % contrast injection 80 mL (80 mL IntraVENous Given 11/19/20 0500)           Procedures        Medical Decision Making     Presented to the emergency room with epigastric pain with history of pancreatitis.  She has a history of alcoholic pancreatitis with multiple recurrences of this in the past.  She  states that this was similar to previous episodes.  She states that she had been drinking a few days prior to onset of pain.  Patient has markedly elevated lipase consistent with pancreatitis.  CT of abdomen shows an enlarging pancreatic head pseudocyst.   She has mild elevation of white count.  Patient was started on Zosyn to cover for possible infection.  She has been started on IV fluids and started on pain medication as well as antiemetics.  Patient will be admitted to the hospital.        Final Diagnosis                 ICD-10-CM  ICD-9-CM  1.  Acute on chronic pancreatitis (HCC)   K85.90  577.0           K86.1  577.1          2.  Chronic alcoholic pancreatitis (Uncertain)   K86.0  577.1          3.  Pancreatic pseudocyst   K86.3  577.2             Disposition     Admit to MedSurg        Current Discharge Medication List                  Type(s) of Personal Protection Equipment (PPE) worn during exam:  mask, gloves, eyeshield      Hilton Sinclair, MPA, PA-C   11/18/2020   2:03 AM      The patient was personally evaluated by myself and HSU, Acquanetta Belling, MD who agrees with the above assessment and plan.      My signature above authenticates this document and my orders, the final  diagnosis(es), discharge prescription (s), and instructions in the Epic record. If you have any questions please contact [phone].       Nursing notes have been reviewed by the Physician/Advanced Practice Clinician. Dragon medical dictation software was used for portions of this report. Unintended voice recognition errors may occur.

## 2020-11-19 NOTE — ED Notes (Signed)
 TRANSFER - IN REPORT:    Verbal report received from Denise(name) on Rhonda Hammond  being received from ED OBS(unit) for routine progression of care      Report consisted of patient's Situation, Background, Assessment and   Recommendations(SBAR).     Information from the following report(s) SBAR, ED Summary, MAR, and Recent Results was reviewed with the receiving nurse.    Opportunity for questions and clarification was provided.      Assessment completed upon patient's arrival to unit and care assumed.

## 2020-11-20 LAB — RENAL FUNCTION PANEL
Albumin: 3.9 gm/dl (ref 3.4–5.0)
Albumin: 3.9 gm/dl (ref 3.4–5.0)
Anion Gap: 9 mmol/L (ref 5–15)
Anion gap: 9 mmol/L (ref 5–15)
BUN: 6 mg/dl — ABNORMAL LOW (ref 9–23)
BUN: 6 mg/dl — ABNORMAL LOW (ref 9–23)
CO2: 22 mEq/L (ref 20–31)
CO2: 22 mEq/L (ref 20–31)
Calcium: 9.2 mg/dl (ref 8.7–10.4)
Calcium: 9.2 mg/dl (ref 8.7–10.4)
Chloride: 108 mEq/L — ABNORMAL HIGH (ref 98–107)
Chloride: 108 mEq/L — ABNORMAL HIGH (ref 98–107)
Creatinine: 0.58 mg/dl (ref 0.55–1.02)
Creatinine: 0.58 mg/dl (ref 0.55–1.02)
EGFR IF NonAfrican American: >60
GFR African American: >60
GFR est AA: >60
GFR est non-AA: >60
Glucose: 108 mg/dl — ABNORMAL HIGH (ref 74–106)
Glucose: 108 mg/dl — ABNORMAL HIGH (ref 74–106)
Phosphorus: 2.7 mg/dL (ref 2.4–5.1)
Phosphorus: 2.7 mg/dL (ref 2.4–5.1)
Potassium: 3.8 mEq/L (ref 3.5–5.1)
Potassium: 3.8 mEq/L (ref 3.5–5.1)
Sodium: 139 mEq/L (ref 136–145)
Sodium: 139 mEq/L (ref 136–145)

## 2020-11-20 LAB — CBC WITH AUTOMATED DIFF
BASOPHILS: 0.1 % (ref 0–3)
EOSINOPHILS: 0.1 % (ref 0–5)
HCT: 28.9 % — ABNORMAL LOW (ref 37.0–50.0)
HGB: 10 gm/dl — ABNORMAL LOW (ref 13.0–17.2)
IMMATURE GRANULOCYTES: 0.4 % (ref 0.0–3.0)
LYMPHOCYTES: 9.3 % — ABNORMAL LOW (ref 28–48)
MCH: 27.5 pg (ref 25.4–34.6)
MCHC: 34.6 gm/dl (ref 30.0–36.0)
MCV: 79.6 fL — ABNORMAL LOW (ref 80.0–98.0)
MONOCYTES: 6.8 % (ref 1–13)
MPV: 10.3 fL — ABNORMAL HIGH (ref 6.0–10.0)
NEUTROPHILS: 83.3 % — ABNORMAL HIGH (ref 34–64)
NRBC: 0 (ref 0–0)
PLATELET: 183 10*3/uL (ref 140–450)
RBC: 3.63 M/uL (ref 3.60–5.20)
RDW-SD: 46 (ref 36.4–46.3)
WBC: 15.9 10*3/uL — ABNORMAL HIGH (ref 4.0–11.0)

## 2020-11-20 LAB — HEPATIC FUNCTION PANEL
ALT (SGPT): 14 U/L (ref 10–49)
ALT: 14 U/L (ref 10–49)
AST (SGOT): 28 U/L (ref 0.0–33.9)
AST: 28 U/L (ref 0.0–33.9)
Albumin: 4 gm/dl (ref 3.4–5.0)
Albumin: 4 gm/dl (ref 3.4–5.0)
Alk. phosphatase: 60 U/L (ref 46–116)
Alkaline Phosphatase: 60 U/L (ref 46–116)
Bilirubin, Direct: 0.3 mg/dl (ref 0.0–0.3)
Bilirubin, direct: 0.3 mg/dl (ref 0.0–0.3)
Bilirubin, total: 0.6 mg/dl (ref 0.30–1.20)
Protein, total: 7.8 gm/dl (ref 5.7–8.2)
Total Bilirubin: 0.6 mg/dl (ref 0.30–1.20)
Total Protein: 7.8 gm/dl (ref 5.7–8.2)

## 2020-11-20 LAB — GLUCOSE, POC: Glucose (POC): 111 mg/dL — ABNORMAL HIGH (ref 65–105)

## 2020-11-20 LAB — LIPASE
Lipase: 198 U/L — ABNORMAL HIGH (ref 12–53)
Lipase: 198 U/L — ABNORMAL HIGH (ref 12–53)

## 2020-11-20 LAB — MAGNESIUM
Magnesium: 2 mg/dL (ref 1.6–2.6)
Magnesium: 2 mg/dL (ref 1.6–2.6)

## 2020-11-20 LAB — CBC WITH AUTO DIFFERENTIAL
Basophils %: 0.1 % (ref 0–3)
Eosinophils %: 0.1 % (ref 0–5)
Hematocrit: 28.9 % — ABNORMAL LOW (ref 37.0–50.0)
Hemoglobin: 10 gm/dl — ABNORMAL LOW (ref 13.0–17.2)
Immature Granulocytes: 0.4 % (ref 0.0–3.0)
Lymphocytes %: 9.3 % — ABNORMAL LOW (ref 28–48)
MCH: 27.5 pg (ref 25.4–34.6)
MCHC: 34.6 gm/dl (ref 30.0–36.0)
MCV: 79.6 fL — ABNORMAL LOW (ref 80.0–98.0)
MPV: 10.3 fL — ABNORMAL HIGH (ref 6.0–10.0)
Monocytes %: 6.8 % (ref 1–13)
Neutrophils %: 83.3 % — ABNORMAL HIGH (ref 34–64)
Nucleated RBCs: 0 (ref 0–0)
Platelets: 183 10*3/uL (ref 140–450)
RBC: 3.63 M/uL (ref 3.60–5.20)
RDW-SD: 46 (ref 36.4–46.3)
WBC: 15.9 10*3/uL — ABNORMAL HIGH (ref 4.0–11.0)

## 2020-11-20 LAB — POCT GLUCOSE: POC Glucose: 111 mg/dL — ABNORMAL HIGH (ref 65–105)

## 2020-11-20 MED ORDER — IOPAMIDOL 61 % IV SOLN
61 % | Freq: Once | INTRAVENOUS | Status: AC
Start: 2020-11-20 — End: 2020-11-20
  Administered 2020-11-20: 17:00:00 via INTRAVENOUS

## 2020-11-20 MED ADMIN — ondansetron (ZOFRAN) injection 4 mg: INTRAVENOUS | @ 23:00:00 | NDC 23155054831

## 2020-11-20 MED ADMIN — LORazepam (ATIVAN) tablet 1 mg: ORAL | NDC 60687063811

## 2020-11-20 MED ADMIN — ondansetron (ZOFRAN) injection 4 mg: INTRAVENOUS | @ 19:00:00 | NDC 23155054831

## 2020-11-20 MED ADMIN — morphine injection 2 mg: INTRAVENOUS | @ 23:00:00 | NDC 76045000401

## 2020-11-20 MED ADMIN — piperacillin-tazobactam (ZOSYN) 3.375 g in 0.9% sodium chloride (MBP/ADV) 100 mL MBP: INTRAVENOUS | @ 23:00:00 | NDC 63323098341

## 2020-11-20 MED ADMIN — piperacillin-tazobactam (ZOSYN) 3.375 g in 0.9% sodium chloride (MBP/ADV) 100 mL MBP: INTRAVENOUS | @ 09:00:00 | NDC 25021016530

## 2020-11-20 MED ADMIN — morphine injection 2 mg: INTRAVENOUS | @ 01:00:00 | NDC 76045000401

## 2020-11-20 MED ADMIN — acetaminophen (TYLENOL) tablet 650 mg: ORAL | @ 09:00:00 | NDC 50580060003

## 2020-11-20 MED ADMIN — morphine injection 2 mg: INTRAVENOUS | @ 06:00:00 | NDC 76045000410

## 2020-11-20 MED ADMIN — ondansetron (ZOFRAN) injection 4 mg: INTRAVENOUS | @ 14:00:00 | NDC 23155054831

## 2020-11-20 MED ADMIN — heparin (porcine) injection 5,000 Units: SUBCUTANEOUS | @ 19:00:00 | NDC 71288042295

## 2020-11-20 MED ADMIN — morphine injection 2 mg: INTRAVENOUS | @ 10:00:00 | NDC 76045000401

## 2020-11-20 MED ADMIN — dextrose 5% and 0.9% NaCl 1,000 mL with potassium chloride 40 mEq, magnesium sulfate 2 g, folic acid 1 mg, thiamine 100 mg, mvi (adult no.4 with vit K) 10 mL infusion: INTRAVENOUS | @ 14:00:00 | NDC 63323006441

## 2020-11-20 MED ADMIN — LORazepam (ATIVAN) tablet 1 mg: ORAL | @ 06:00:00 | NDC 60687035511

## 2020-11-20 MED ADMIN — piperacillin-tazobactam (ZOSYN) 3.375 g in 0.9% sodium chloride (MBP/ADV) 100 mL MBP: INTRAVENOUS | @ 16:00:00 | NDC 63323098341

## 2020-11-20 MED ADMIN — morphine injection 2 mg: INTRAVENOUS | @ 15:00:00 | NDC 76045000401

## 2020-11-20 MED ADMIN — morphine injection 2 mg: INTRAVENOUS | @ 19:00:00 | NDC 76045000401

## 2020-11-20 MED ADMIN — ondansetron (ZOFRAN) injection 4 mg: INTRAVENOUS | @ 10:00:00 | NDC 23155054831

## 2020-11-20 MED ADMIN — heparin (porcine) injection 5,000 Units: SUBCUTANEOUS | @ 10:00:00 | NDC 71288042295

## 2020-11-20 MED ADMIN — heparin (porcine) injection 5,000 Units: SUBCUTANEOUS | @ 01:00:00 | NDC 71288042295

## 2020-11-20 MED FILL — MORPHINE 2 MG/ML INJECTION: 2 mg/mL | INTRAMUSCULAR | Qty: 1

## 2020-11-20 MED FILL — ISOVUE-300  61 % INTRAVENOUS SOLUTION: 300 mg iodine /mL (61 %) | INTRAVENOUS | Qty: 85

## 2020-11-20 MED FILL — HEPARIN (PORCINE) 5,000 UNIT/ML IJ SOLN: 5000 unit/mL | INTRAMUSCULAR | Qty: 1

## 2020-11-20 MED FILL — PIPERACILLIN-TAZOBACTAM 3.375 GRAM IV SOLR: 3.375 gram | INTRAVENOUS | Qty: 3.38

## 2020-11-20 MED FILL — ONDANSETRON (PF) 4 MG/2 ML INJECTION: 4 mg/2 mL | INTRAMUSCULAR | Qty: 2

## 2020-11-20 MED FILL — LORAZEPAM 1 MG TAB: 1 mg | ORAL | Qty: 1

## 2020-11-20 MED FILL — TYLENOL 325 MG TABLET: 325 mg | ORAL | Qty: 2

## 2020-11-20 MED FILL — DEXTROSE 5% IN NORMAL SALINE IV: INTRAVENOUS | Qty: 1000

## 2020-11-20 NOTE — ED Notes (Signed)
Assumed care of pt who is on her knees on the floor leaning into chair, c/o abdominal pain and stating she threw up after getting po tylenol. No other orders are available for pt until 6 am when she is due to receive morphine. Will page hospitalist for notification and orders

## 2020-11-20 NOTE — Progress Notes (Signed)
Medical Progress Note      NAME: Rhonda Hammond   DOB:  1990/08/21  MRM:  [id number]    Date/Time: 11/20/2020  11:52 AM         Subjective:     CO nausea and vomiting with clear liquids  Lipase trend is down.     Past Medical History reviewed and unchanged from Admission History and Physical    Review of Systems   Constitutional:  Positive for fatigue.   HENT: Negative.     Eyes: Negative.    Respiratory: Negative.     Cardiovascular: Negative.    Gastrointestinal:  Positive for nausea and vomiting.   Endocrine: Negative.    Genitourinary: Negative.    Musculoskeletal: Negative.    Skin: Negative.    Neurological: Negative.    Hematological: Negative.           Objective:       Vitals:      Last 24hrs VS reviewed since prior progress note. Most recent are:    Visit Vitals  BP (!) 138/94   Pulse 91   Temp (!) 96.4 ??F (35.8 ??C)   Resp 18   Ht 5\' 7"  (1.702 m)   Wt 96.2 kg (212 lb)   SpO2 100%   BMI 33.20 kg/m??     SpO2 Readings from Last 6 Encounters:   11/20/20 100%   05/07/20 100%   02/25/20 98%        No intake or output data in the 24 hours ending 11/20/20 1152       Exam:      Physical Exam  Vitals and nursing note reviewed.   Constitutional:       General: She is not in acute distress.  HENT:      Head: Normocephalic.      Mouth/Throat:      Mouth: Mucous membranes are moist.   Eyes:      Extraocular Movements: Extraocular movements intact.      Pupils: Pupils are equal, round, and reactive to light.   Cardiovascular:      Rate and Rhythm: Normal rate and regular rhythm.   Pulmonary:      Effort: Pulmonary effort is normal.      Breath sounds: Normal breath sounds.   Abdominal:      General: Bowel sounds are normal.      Palpations: Abdomen is soft.      Tenderness: There is abdominal tenderness. There is no rebound.   Musculoskeletal:         General: Normal range of motion.      Cervical back: Normal range of motion.   Skin:     General: Skin is warm.   Neurological:      General: No focal deficit present.       Mental Status: She is alert and oriented to person, place, and time.       Lab Data Reviewed: (see below)      Medications:  Current Facility-Administered Medications   Medication Dose Route Frequency    piperacillin-tazobactam (ZOSYN) 3.375 g in 0.9% sodium chloride (MBP/ADV) 100 mL MBP  3.375 g IntraVENous Q8H    ondansetron (ZOFRAN) injection 4 mg  4 mg IntraVENous Q4H PRN    LORazepam (ATIVAN) tablet 1 mg  1 mg Oral Q1H PRN    Or    LORazepam (ATIVAN) injection 1 mg  1 mg IntraVENous Q1H PRN    LORazepam (ATIVAN) tablet 2 mg  2  mg Oral Q1H PRN    Or    LORazepam (ATIVAN) injection 2 mg  2 mg IntraVENous Q1H PRN    LORazepam (ATIVAN) injection 3 mg  3 mg IntraVENous Q15MIN PRN    dextrose 5% and 0.9% NaCl 1,000 mL with potassium chloride 40 mEq, magnesium sulfate 2 g, folic acid 1 mg, thiamine 100 mg, mvi (adult no.4 with vit K) 10 mL infusion   IntraVENous DAILY    naloxone (NARCAN) injection 0.1 mg  0.1 mg IntraVENous PRN    acetaminophen (TYLENOL) tablet 650 mg  650 mg Oral Q4H PRN    Or    acetaminophen (TYLENOL) solution 650 mg  650 mg Oral Q4H PRN    Or    acetaminophen (TYLENOL) suppository 650 mg  650 mg Rectal Q4H PRN    morphine injection 2 mg  2 mg IntraVENous Q4H PRN    diphenhydrAMINE (BENADRYL) injection 25 mg  25 mg IntraVENous Q4H PRN    heparin (porcine) injection 5,000 Units  5,000 Units SubCUTAneous Q8H     Current Outpatient Medications   Medication Sig Dispense    sucralfate (Carafate) 1 gram tablet Take 1 Tablet by mouth four (4) times daily. 20 Tablet    ondansetron (ZOFRAN ODT) 4 mg disintegrating tablet Take 1 Tablet by mouth every eight (8) hours as needed for Nausea or Vomiting. 20 Tablet       ______________________________________________________________________      Lab Review:     Recent Labs     11/20/20  0450 11/19/20  0152   WBC 15.9* 11.8*   HGB 10.0* 11.4*   HCT 28.9* 31.4*   PLT 183 256     Recent Labs     11/20/20  0450 11/19/20  0152   NA 139 137   K 3.8 3.4*   CL 108* 103    CO2 22 24   GLU 108* 123*   BUN 6* 8*   CREA 0.58 0.75   CA 9.2 9.7   MG 2.0  --    PHOS 2.7  --    ALB 3.9 4.3   ALT  --  20          Assessment:     Active Problems:    Acute on chronic pancreatitis (HCC) (12/16/2018)      Chronic alcoholic pancreatitis (HCC) (11/19/2020)      Pancreatic pseudocyst (11/19/2020)      Acute pancreatitis (11/19/2020)           Plan:     Risk of deterioration: medium             Acute on chronic pancreatitis due to EtOH  Polysubstance dependence  Intractable nausea and vomiting  Dehydration due to #3      NPO x ice chips  IV fluids  Symptom management  Will repeat CT abd         Total time spent with patient: 30 Minutes                  Care Plan discussed with: Patient, Nursing Staff, and >50% of time spent in counseling and coordination of care    Discussed:  Care Plan    Prophylaxis:  Hep SQ, SCD's, and H2B/PPI    Disposition:  Home w/Family           ___________________________________________________    Attending Physician: Marlene Bast, MD

## 2020-11-20 NOTE — ED Notes (Signed)
Verbal shift change report given to Amy, RN  (oncoming nurse) by Elita Quick, RN (offgoing nurse). Report included the following information SBAR, Kardex, ED Summary, MAR, and Recent Results.

## 2020-11-20 NOTE — ED Notes (Signed)
 TRANSFER - OUT REPORT:    Verbal report given to [place], RN (name) on Rhonda Hammond  being transferred to 2 [facility] (unit) for routine progression of care       Report consisted of patient's Situation, Background, Assessment and   Recommendations(SBAR).     Information from the following report(s) SBAR, Kardex, ED Summary, MAR, and Recent Results was reviewed with the receiving nurse.    Lines:   Peripheral IV 11/19/20 Left Hand (Active)   Site Assessment Clean, dry, & intact 11/19/20 1720   Dressing Status Clean, dry, & intact 11/19/20 1720   Dressing Type Transparent 11/19/20 1720   Hub Color/Line Status Flushed 11/19/20 1720        Opportunity for questions and clarification was provided.      Patient transported with:   [employer]

## 2020-11-20 NOTE — ED Notes (Signed)
No callback from hospitalist  Given 0600 meds, including sq heparin; IV Zofran and morphine; piperacillin infusing via left hand, site s/p; pt is requesting to shower, but explained to her not safe on night shift with only 2 staff. Pt is high risk for falls due to narcotic administration frequency and CIWA ativan admin. Will have to way for say shift which starts at 0730 and for some time to elapse between med/narcotic admin. Pt is not happy with this decision, but understands

## 2020-11-20 NOTE — ED Notes (Signed)
Pt hand Iv wrapped and pt to shower at this time.

## 2020-11-20 NOTE — ED Notes (Signed)
Pt is sound asleep in bedside chair

## 2020-11-20 NOTE — ED Notes (Signed)
Pt given Zofran and Morphine for pain relief and nausea.

## 2020-11-20 NOTE — ED Notes (Signed)
Medicated patient per [facility] for anxiety and pain. Patient sitting backwards in chair and rocking back and forth.

## 2020-11-20 NOTE — ED Notes (Signed)
Pt resting comfortably at this time.

## 2020-11-21 LAB — HEPATIC FUNCTION PANEL
ALT (SGPT): 12 U/L (ref 10–49)
ALT: 12 U/L (ref 10–49)
AST (SGOT): 16 U/L (ref 0.0–33.9)
AST: 16 U/L (ref 0.0–33.9)
Albumin: 3.3 gm/dl — ABNORMAL LOW (ref 3.4–5.0)
Albumin: 3.3 gm/dl — ABNORMAL LOW (ref 3.4–5.0)
Alk. phosphatase: 49 U/L (ref 46–116)
Alkaline Phosphatase: 49 U/L (ref 46–116)
Bilirubin, Direct: 0.3 mg/dl (ref 0.0–0.3)
Bilirubin, direct: 0.3 mg/dl (ref 0.0–0.3)
Bilirubin, total: 0.6 mg/dl (ref 0.30–1.20)
Protein, total: 7 gm/dl (ref 5.7–8.2)
Total Bilirubin: 0.6 mg/dl (ref 0.30–1.20)
Total Protein: 7 gm/dl (ref 5.7–8.2)

## 2020-11-21 LAB — LIPASE
Lipase: 67 U/L — ABNORMAL HIGH (ref 12–53)
Lipase: 67 U/L — ABNORMAL HIGH (ref 12–53)

## 2020-11-21 LAB — RENAL FUNCTION PANEL
Albumin: 3.3 gm/dl — ABNORMAL LOW (ref 3.4–5.0)
Albumin: 3.3 gm/dl — ABNORMAL LOW (ref 3.4–5.0)
Anion Gap: 8 mmol/L (ref 5–15)
Anion gap: 8 mmol/L (ref 5–15)
BUN: 5 mg/dl — ABNORMAL LOW (ref 9–23)
BUN: 5 mg/dl — ABNORMAL LOW (ref 9–23)
CO2: 26 mEq/L (ref 20–31)
CO2: 26 mEq/L (ref 20–31)
Calcium: 8.8 mg/dl (ref 8.7–10.4)
Calcium: 8.8 mg/dl (ref 8.7–10.4)
Chloride: 103 mEq/L (ref 98–107)
Chloride: 103 mEq/L (ref 98–107)
Creatinine: 0.51 mg/dl — ABNORMAL LOW (ref 0.55–1.02)
Creatinine: 0.51 mg/dl — ABNORMAL LOW (ref 0.55–1.02)
EGFR IF NonAfrican American: >60
GFR African American: >60
GFR est AA: >60
GFR est non-AA: >60
Glucose: 81 mg/dl (ref 74–106)
Glucose: 81 mg/dl (ref 74–106)
Phosphorus: 3.5 mg/dL (ref 2.4–5.1)
Phosphorus: 3.5 mg/dL (ref 2.4–5.1)
Potassium: 3.2 mEq/L — ABNORMAL LOW (ref 3.5–5.1)
Potassium: 3.2 mEq/L — ABNORMAL LOW (ref 3.5–5.1)
Sodium: 137 mEq/L (ref 136–145)
Sodium: 137 mEq/L (ref 136–145)

## 2020-11-21 LAB — CBC WITH AUTOMATED DIFF
BASOPHILS: 0.2 % (ref 0–3)
EOSINOPHILS: 0.9 % (ref 0–5)
HCT: 24.9 % — ABNORMAL LOW (ref 37.0–50.0)
HGB: 8.7 gm/dl — ABNORMAL LOW (ref 13.0–17.2)
IMMATURE GRANULOCYTES: 0.4 % (ref 0.0–3.0)
LYMPHOCYTES: 14.1 % — ABNORMAL LOW (ref 28–48)
MCH: 28.1 pg (ref 25.4–34.6)
MCHC: 34.9 gm/dl (ref 30.0–36.0)
MCV: 80.3 fL (ref 80.0–98.0)
MONOCYTES: 7.3 % (ref 1–13)
MPV: 11.2 fL — ABNORMAL HIGH (ref 6.0–10.0)
NEUTROPHILS: 77.1 % — ABNORMAL HIGH (ref 34–64)
NRBC: 1 — ABNORMAL HIGH (ref 0–0)
PLATELET: 174 10*3/uL (ref 140–450)
RBC: 3.1 M/uL — ABNORMAL LOW (ref 3.60–5.20)
RDW-SD: 46.4 — ABNORMAL HIGH (ref 36.4–46.3)
WBC: 13.2 10*3/uL — ABNORMAL HIGH (ref 4.0–11.0)

## 2020-11-21 LAB — CBC WITH AUTO DIFFERENTIAL
Basophils %: 0.2 % (ref 0–3)
Eosinophils %: 0.9 % (ref 0–5)
Hematocrit: 24.9 % — ABNORMAL LOW (ref 37.0–50.0)
Hemoglobin: 8.7 gm/dl — ABNORMAL LOW (ref 13.0–17.2)
Immature Granulocytes: 0.4 % (ref 0.0–3.0)
Lymphocytes %: 14.1 % — ABNORMAL LOW (ref 28–48)
MCH: 28.1 pg (ref 25.4–34.6)
MCHC: 34.9 gm/dl (ref 30.0–36.0)
MCV: 80.3 fL (ref 80.0–98.0)
MPV: 11.2 fL — ABNORMAL HIGH (ref 6.0–10.0)
Monocytes %: 7.3 % (ref 1–13)
Neutrophils %: 77.1 % — ABNORMAL HIGH (ref 34–64)
Nucleated RBCs: 1 — ABNORMAL HIGH (ref 0–0)
Platelets: 174 10*3/uL (ref 140–450)
RBC: 3.1 M/uL — ABNORMAL LOW (ref 3.60–5.20)
RDW-SD: 46.4 — ABNORMAL HIGH (ref 36.4–46.3)
WBC: 13.2 10*3/uL — ABNORMAL HIGH (ref 4.0–11.0)

## 2020-11-21 MED ORDER — SODIUM CHLORIDE 0.9 % IV
INTRAVENOUS | Status: DC
Start: 2020-11-21 — End: 2020-11-22
  Administered 2020-11-21 – 2020-11-22 (×2): via INTRAVENOUS

## 2020-11-21 MED ADMIN — morphine injection 2 mg: INTRAVENOUS | @ 10:00:00 | NDC 76045000401

## 2020-11-21 MED ADMIN — piperacillin-tazobactam (ZOSYN) 3.375 g in 0.9% sodium chloride (MBP/ADV) 100 mL MBP: INTRAVENOUS | @ 08:00:00 | NDC 63323098341

## 2020-11-21 MED ADMIN — ondansetron (ZOFRAN) injection 4 mg: INTRAVENOUS | @ 06:00:00 | NDC 23155054831

## 2020-11-21 MED ADMIN — piperacillin-tazobactam (ZOSYN) 3.375 g in 0.9% sodium chloride (MBP/ADV) 100 mL MBP: INTRAVENOUS | @ 23:00:00 | NDC 63323098341

## 2020-11-21 MED ADMIN — heparin (porcine) injection 5,000 Units: SUBCUTANEOUS | @ 17:00:00 | NDC 71288042295

## 2020-11-21 MED ADMIN — ondansetron (ZOFRAN) injection 4 mg: INTRAVENOUS | @ 15:00:00 | NDC 23155054831

## 2020-11-21 MED ADMIN — heparin (porcine) injection 5,000 Units: SUBCUTANEOUS | @ 19:00:00 | NDC 00009029101

## 2020-11-21 MED ADMIN — heparin (porcine) injection 5,000 Units: SUBCUTANEOUS | @ 01:00:00 | NDC 71288042295

## 2020-11-21 MED ADMIN — morphine injection 2 mg: INTRAVENOUS | @ 18:00:00 | NDC 76045000401

## 2020-11-21 MED ADMIN — ondansetron (ZOFRAN) injection 4 mg: INTRAVENOUS | @ 18:00:00 | NDC 23155054831

## 2020-11-21 MED ADMIN — dextrose 5% and 0.9% NaCl 1,000 mL with potassium chloride 40 mEq, magnesium sulfate 2 g, folic acid 1 mg, thiamine 100 mg, mvi (adult no.4 with vit K) 10 mL infusion: INTRAVENOUS | @ 15:00:00 | NDC 63323006441

## 2020-11-21 MED ADMIN — ondansetron (ZOFRAN) injection 4 mg: INTRAVENOUS | @ 23:00:00 | NDC 23155054831

## 2020-11-21 MED ADMIN — morphine injection 2 mg: INTRAVENOUS | @ 15:00:00 | NDC 76045000401

## 2020-11-21 MED ADMIN — morphine injection 2 mg: INTRAVENOUS | @ 06:00:00 | NDC 76045000401

## 2020-11-21 MED ADMIN — acetaminophen (TYLENOL) tablet 650 mg: ORAL | @ 20:00:00 | NDC 71399802402

## 2020-11-21 MED ADMIN — morphine injection 2 mg: INTRAVENOUS | @ 03:00:00 | NDC 76045000401

## 2020-11-21 MED ADMIN — morphine injection 2 mg: INTRAVENOUS | @ 23:00:00 | NDC 76045000401

## 2020-11-21 MED ADMIN — ondansetron (ZOFRAN) injection 4 mg: INTRAVENOUS | @ 03:00:00 | NDC 23155054831

## 2020-11-21 MED ADMIN — piperacillin-tazobactam (ZOSYN) 3.375 g in 0.9% sodium chloride (MBP/ADV) 100 mL MBP: INTRAVENOUS | @ 17:00:00 | NDC 63323098341

## 2020-11-21 MED ADMIN — heparin (porcine) injection 5,000 Units: SUBCUTANEOUS | @ 10:00:00 | NDC 71288042295

## 2020-11-21 MED ADMIN — ondansetron (ZOFRAN) injection 4 mg: INTRAVENOUS | @ 10:00:00 | NDC 23155054831

## 2020-11-21 MED FILL — SODIUM CHLORIDE 0.9 % IV: INTRAVENOUS | Qty: 1000

## 2020-11-21 MED FILL — MORPHINE 2 MG/ML INJECTION: 2 mg/mL | INTRAMUSCULAR | Qty: 1

## 2020-11-21 MED FILL — PIPERACILLIN-TAZOBACTAM 3.375 GRAM IV SOLR: 3.375 gram | INTRAVENOUS | Qty: 3.38

## 2020-11-21 MED FILL — ONDANSETRON (PF) 4 MG/2 ML INJECTION: 4 mg/2 mL | INTRAMUSCULAR | Qty: 2

## 2020-11-21 MED FILL — TYLENOL 325 MG TABLET: 325 mg | ORAL | Qty: 2

## 2020-11-21 MED FILL — DEXTROSE 5% IN NORMAL SALINE IV: INTRAVENOUS | Qty: 1000

## 2020-11-21 MED FILL — HEPARIN (PORCINE) 5,000 UNIT/ML IJ SOLN: 5000 unit/mL | INTRAMUSCULAR | Qty: 1

## 2020-11-21 NOTE — Progress Notes (Signed)
Problem: Falls - Risk of  Goal: *Absence of Falls  Description: Document Schmid Fall Risk and appropriate interventions in the flowsheet.  Outcome: Progressing Towards Goal  Note: Fall Risk Interventions:            Medication Interventions: Patient to call before getting OOB, Teach patient to arise slowly                   Problem: Patient Education: Go to Patient Education Activity  Goal: Patient/Family Education  Outcome: Progressing Towards Goal

## 2020-11-21 NOTE — Progress Notes (Signed)
Medical Progress Note      NAME: Rhonda Hammond   DOB:  [birth year]  MRM:  [id number]    Date/Time: 11/21/2020  11:52 AM         Subjective:     CO nausea and vomiting with clear liquids---still  Lipase trend is down.     Past Medical History reviewed and unchanged from Admission History and Physical    Review of Systems   Constitutional:  Positive for fatigue.   HENT: Negative.     Eyes: Negative.    Respiratory: Negative.     Cardiovascular: Negative.    Gastrointestinal:  Positive for nausea and vomiting.   Endocrine: Negative.    Genitourinary: Negative.    Musculoskeletal: Negative.    Skin: Negative.    Neurological: Negative.    Hematological: Negative.           Objective:       Vitals:      Last 24hrs VS reviewed since prior progress note. Most recent are:    Visit Vitals  BP 116/79 (BP 1 Location: Left upper arm, BP Patient Position: Supine)   Pulse 71   Temp 99.3 ??F (37.4 ??C)   Resp 15   Ht 5\' 7"  (1.702 m)   Wt 96.2 kg (212 lb)   SpO2 97%   BMI 33.20 kg/m??     SpO2 Readings from Last 6 Encounters:   11/21/20 97%   05/07/20 100%   02/25/20 98%          Intake/Output Summary (Last 24 hours) at 11/21/2020 1356  Last data filed at 11/21/2020 13/06/2020  Gross per 24 hour   Intake 520 ml   Output --   Net 520 ml            Exam:      Physical Exam  Vitals and nursing note reviewed.   Constitutional:       General: She is not in acute distress.  HENT:      Head: Normocephalic.      Mouth/Throat:      Mouth: Mucous membranes are moist.   Eyes:      Extraocular Movements: Extraocular movements intact.      Pupils: Pupils are equal, round, and reactive to light.   Cardiovascular:      Rate and Rhythm: Normal rate and regular rhythm.   Pulmonary:      Effort: Pulmonary effort is normal.      Breath sounds: Normal breath sounds.   Abdominal:      General: Bowel sounds are normal.      Palpations: Abdomen is soft.      Tenderness: There is abdominal tenderness. There is no rebound.   Musculoskeletal:         General: Normal  range of motion.      Cervical back: Normal range of motion.   Skin:     General: Skin is warm.   Neurological:      General: No focal deficit present.      Mental Status: She is alert and oriented to person, place, and time.       Lab Data Reviewed: (see below)      Medications:  Current Facility-Administered Medications   Medication Dose Route Frequency    0.9% sodium chloride infusion  125 mL/hr IntraVENous CONTINUOUS    piperacillin-tazobactam (ZOSYN) 3.375 g in 0.9% sodium chloride (MBP/ADV) 100 mL MBP  3.375 g IntraVENous Q8H    ondansetron (ZOFRAN) injection 4  mg  4 mg IntraVENous Q4H PRN    LORazepam (ATIVAN) tablet 1 mg  1 mg Oral Q1H PRN    Or    LORazepam (ATIVAN) injection 1 mg  1 mg IntraVENous Q1H PRN    LORazepam (ATIVAN) tablet 2 mg  2 mg Oral Q1H PRN    Or    LORazepam (ATIVAN) injection 2 mg  2 mg IntraVENous Q1H PRN    LORazepam (ATIVAN) injection 3 mg  3 mg IntraVENous Q15MIN PRN    naloxone (NARCAN) injection 0.1 mg  0.1 mg IntraVENous PRN    acetaminophen (TYLENOL) tablet 650 mg  650 mg Oral Q4H PRN    Or    acetaminophen (TYLENOL) solution 650 mg  650 mg Oral Q4H PRN    Or    acetaminophen (TYLENOL) suppository 650 mg  650 mg Rectal Q4H PRN    morphine injection 2 mg  2 mg IntraVENous Q4H PRN    diphenhydrAMINE (BENADRYL) injection 25 mg  25 mg IntraVENous Q4H PRN    heparin (porcine) injection 5,000 Units  5,000 Units SubCUTAneous Q8H       ______________________________________________________________________      Lab Review:     Recent Labs     11/21/20  0237 11/20/20  0450 11/19/20  0152   WBC 13.2* 15.9* 11.8*   HGB 8.7* 10.0* 11.4*   HCT 24.9* 28.9* 31.4*   PLT 174 183 256       Recent Labs     11/21/20  0237 11/20/20  0450 11/19/20  0152   NA 137 139 137   K 3.2* 3.8 3.4*   CL 103 108* 103   CO2 26 22 24    GLU 81 108* 123*   BUN 5* 6* 8*   CREA 0.51* 0.58 0.75   CA 8.8 9.2 9.7   MG  --  2.0  --    PHOS 3.5 2.7  --    ALB 3.3*   3.3* 4.0   3.9 4.3   ALT 12 14 20        ..CT Results (most  recent):  Results from Hospital Encounter encounter on 11/18/20    CT ABD PELV W CONT    Narrative  EXAMINATION: CT ABD PELV W CONT    CLINICAL INDICATION: acute pancreatitis. Worsening symptoms    COMPARISON:  11/19/2020;    TECHNIQUE:  All CT exams at this facility use one or more dose reduction  techniques including automatic exposure control, ma/kV  adjustment per patient's  size, or iterative reconstruction technique.    Multiple contiguous axial CT images at 5  mm increments were obtained from the  bases of the lungs to the  pubic symphysis with intravenous contrast.    FINDINGS:    Visualized lungs/chest: Moderate subpleural discoid atelectasis versus scarring.    Hepatobiliary: Unremarkable. Gallbladder normal.    Kidneys/ureters: Unremarkable.    Esophagus/Stomach: Unremarkable.    Adrenal glands: Unremarkable.    Pancreas: No change pancreatic head pseudocyst 2.3 x 1.7 cm. Diffuse mild edema  pancreas. Peripancreatic fluid, unchanged or improved from previous exam.    Spleen: Unremarkable.    Vascular structures:  Unremarkable.    Peritoneum/mesentery/bowel:  Unremarkable.    Retroperitoneal lymph nodes: Unremarkable    Pelvis:  Small amount of fluid in the pelvis.  Appendix tentatively visualized,  not enlarged.    Bones: No significant lesions.    Miscellaneous: None.    Impression  IMPRESSION:  1. Enlarging pancreatic head pseudocyst. No  change or slightly improving of the  peripancreatic fluid.  2. No acute change.    Electronically signed by: Earline Mayotte, MD 11/20/2020 1:36 PM EDT       Assessment:     Active Problems:    Acute on chronic pancreatitis (HCC) (12/16/2018)      Chronic alcoholic pancreatitis (HCC) (11/19/2020)      Pancreatic pseudocyst (11/19/2020)      Acute pancreatitis (11/19/2020)         Plan:     Risk of deterioration: medium             Acute on chronic pancreatitis due to EtOH  Polysubstance dependence  Intractable nausea and vomiting  Dehydration due to #3  CT shows less fluid  around the pancreas. Pseudocyst is noted.       Clear liquids as tolerated  IV fluids  Symptom management  Will repeat CT abd         Total time spent with patient: 30 Minutes                  Care Plan discussed with: Patient, Nursing Staff, and >50% of time spent in counseling and coordination of care    Discussed:  Care Plan    Prophylaxis:  Hep SQ, SCD's, and H2B/PPI    Disposition:  Home w/Family           ___________________________________________________    Attending Physician: Marlene Bast, MD

## 2020-11-21 NOTE — Progress Notes (Signed)
Medical Progress Note      NAME: Rhonda Hammond   DOB:  [birth year]  MRM:  [id number]    Date/Time: 11/21/2020  11:52 AM         Subjective:     CO nausea and vomiting with clear liquids---still  Lipase trend is down.     Past Medical History reviewed and unchanged from Admission History and Physical    Review of Systems   Constitutional:  Positive for fatigue.   HENT: Negative.     Eyes: Negative.    Respiratory: Negative.     Cardiovascular: Negative.    Gastrointestinal:  Positive for nausea and vomiting.   Endocrine: Negative.    Genitourinary: Negative.    Musculoskeletal: Negative.    Skin: Negative.    Neurological: Negative.    Hematological: Negative.           Objective:       Vitals:      Last 24hrs VS reviewed since prior progress note. Most recent are:    Visit Vitals  BP 116/79 (BP 1 Location: Left upper arm, BP Patient Position: Supine)   Pulse 71   Temp 99.3 ??F (37.4 ??C)   Resp 15   Ht 5\' 7"  (1.702 m)   Wt 96.2 kg (212 lb)   SpO2 97%   BMI 33.20 kg/m??     SpO2 Readings from Last 6 Encounters:   11/21/20 97%   05/07/20 100%   02/25/20 98%          Intake/Output Summary (Last 24 hours) at 11/21/2020 1355  Last data filed at 11/21/2020 13/06/2020  Gross per 24 hour   Intake 520 ml   Output --   Net 520 ml            Exam:      Physical Exam  Vitals and nursing note reviewed.   Constitutional:       General: She is not in acute distress.  HENT:      Head: Normocephalic.      Mouth/Throat:      Mouth: Mucous membranes are moist.   Eyes:      Extraocular Movements: Extraocular movements intact.      Pupils: Pupils are equal, round, and reactive to light.   Cardiovascular:      Rate and Rhythm: Normal rate and regular rhythm.   Pulmonary:      Effort: Pulmonary effort is normal.      Breath sounds: Normal breath sounds.   Abdominal:      General: Bowel sounds are normal.      Palpations: Abdomen is soft.      Tenderness: There is abdominal tenderness. There is no rebound.   Musculoskeletal:         General: Normal  range of motion.      Cervical back: Normal range of motion.   Skin:     General: Skin is warm.   Neurological:      General: No focal deficit present.      Mental Status: She is alert and oriented to person, place, and time.       Lab Data Reviewed: (see below)      Medications:  Current Facility-Administered Medications   Medication Dose Route Frequency    piperacillin-tazobactam (ZOSYN) 3.375 g in 0.9% sodium chloride (MBP/ADV) 100 mL MBP  3.375 g IntraVENous Q8H    ondansetron (ZOFRAN) injection 4 mg  4 mg IntraVENous Q4H PRN    LORazepam (ATIVAN)  tablet 1 mg  1 mg Oral Q1H PRN    Or    LORazepam (ATIVAN) injection 1 mg  1 mg IntraVENous Q1H PRN    LORazepam (ATIVAN) tablet 2 mg  2 mg Oral Q1H PRN    Or    LORazepam (ATIVAN) injection 2 mg  2 mg IntraVENous Q1H PRN    LORazepam (ATIVAN) injection 3 mg  3 mg IntraVENous Q15MIN PRN    dextrose 5% and 0.9% NaCl 1,000 mL with potassium chloride 40 mEq, magnesium sulfate 2 g, folic acid 1 mg, thiamine 100 mg, mvi (adult no.4 with vit K) 10 mL infusion   IntraVENous DAILY    naloxone (NARCAN) injection 0.1 mg  0.1 mg IntraVENous PRN    acetaminophen (TYLENOL) tablet 650 mg  650 mg Oral Q4H PRN    Or    acetaminophen (TYLENOL) solution 650 mg  650 mg Oral Q4H PRN    Or    acetaminophen (TYLENOL) suppository 650 mg  650 mg Rectal Q4H PRN    morphine injection 2 mg  2 mg IntraVENous Q4H PRN    diphenhydrAMINE (BENADRYL) injection 25 mg  25 mg IntraVENous Q4H PRN    heparin (porcine) injection 5,000 Units  5,000 Units SubCUTAneous Q8H       ______________________________________________________________________      Lab Review:     Recent Labs     11/21/20  0237 11/20/20  0450 11/19/20  0152   WBC 13.2* 15.9* 11.8*   HGB 8.7* 10.0* 11.4*   HCT 24.9* 28.9* 31.4*   PLT 174 183 256       Recent Labs     11/21/20  0237 11/20/20  0450 11/19/20  0152   NA 137 139 137   K 3.2* 3.8 3.4*   CL 103 108* 103   CO2 26 22 24    GLU 81 108* 123*   BUN 5* 6* 8*   CREA 0.51* 0.58 0.75   CA  8.8 9.2 9.7   MG  --  2.0  --    PHOS 3.5 2.7  --    ALB 3.3*   3.3* 4.0   3.9 4.3   ALT 12 14 20             Assessment:     Active Problems:    Acute on chronic pancreatitis (HCC) (12/16/2018)      Chronic alcoholic pancreatitis (HCC) (11/19/2020)      Pancreatic pseudocyst (11/19/2020)      Acute pancreatitis (11/19/2020)         Plan:     Risk of deterioration: medium             Acute on chronic pancreatitis due to EtOH  Polysubstance dependence  Intractable nausea and vomiting  Dehydration due to #3      Clear liquids as tolerated  IV fluids  Symptom management  Will repeat CT abd         Total time spent with patient: 30 Minutes                  Care Plan discussed with: Patient, Nursing Staff, and >50% of time spent in counseling and coordination of care    Discussed:  Care Plan    Prophylaxis:  Hep SQ, SCD's, and H2B/PPI    Disposition:  Home w/Family           ___________________________________________________    Attending Physician: 13/04/2020, MD

## 2020-11-22 LAB — RENAL FUNCTION PANEL
Albumin: 3 gm/dl — ABNORMAL LOW (ref 3.4–5.0)
Albumin: 3 gm/dl — ABNORMAL LOW (ref 3.4–5.0)
Anion Gap: 11 mmol/L (ref 5–15)
Anion gap: 11 mmol/L (ref 5–15)
BUN: <5 mg/dl — ABNORMAL LOW (ref 9–23)
BUN: <5 mg/dl — ABNORMAL LOW (ref 9–23)
CO2: 24 mEq/L (ref 20–31)
CO2: 24 mEq/L (ref 20–31)
Calcium: 8.3 mg/dl — ABNORMAL LOW (ref 8.7–10.4)
Calcium: 8.3 mg/dl — ABNORMAL LOW (ref 8.7–10.4)
Chloride: 102 mEq/L (ref 98–107)
Chloride: 102 mEq/L (ref 98–107)
Creatinine: 0.44 mg/dl — ABNORMAL LOW (ref 0.55–1.02)
Creatinine: 0.44 mg/dl — ABNORMAL LOW (ref 0.55–1.02)
EGFR IF NonAfrican American: >60
GFR African American: >60
GFR est AA: >60
GFR est non-AA: >60
Glucose: 79 mg/dl (ref 74–106)
Glucose: 79 mg/dl (ref 74–106)
Phosphorus: 4.1 mg/dL (ref 2.4–5.1)
Phosphorus: 4.1 mg/dL (ref 2.4–5.1)
Potassium: 3.4 mEq/L — ABNORMAL LOW (ref 3.5–5.1)
Potassium: 3.4 mEq/L — ABNORMAL LOW (ref 3.5–5.1)
Sodium: 137 mEq/L (ref 136–145)
Sodium: 137 mEq/L (ref 136–145)

## 2020-11-22 LAB — CBC WITH AUTOMATED DIFF
BASOPHILS: 0.2 % (ref 0–3)
EOSINOPHILS: 2.8 % (ref 0–5)
HCT: 24.1 % — ABNORMAL LOW (ref 37.0–50.0)
HGB: 8.6 gm/dl — ABNORMAL LOW (ref 13.0–17.2)
IMMATURE GRANULOCYTES: 0.4 % (ref 0.0–3.0)
LYMPHOCYTES: 19.9 % — ABNORMAL LOW (ref 28–48)
MCH: 28.8 pg (ref 25.4–34.6)
MCHC: 35.7 gm/dl (ref 30.0–36.0)
MCV: 80.6 fL (ref 80.0–98.0)
MONOCYTES: 8 % (ref 1–13)
MPV: 11.3 fL — ABNORMAL HIGH (ref 6.0–10.0)
NEUTROPHILS: 68.7 % — ABNORMAL HIGH (ref 34–64)
NRBC: 0 (ref 0–0)
PLATELET: 165 10*3/uL (ref 140–450)
RBC: 2.99 M/uL — ABNORMAL LOW (ref 3.60–5.20)
RDW-SD: 46.1 (ref 36.4–46.3)
WBC: 9.2 10*3/uL (ref 4.0–11.0)

## 2020-11-22 LAB — HEPATIC FUNCTION PANEL
ALT (SGPT): <7 U/L — ABNORMAL LOW (ref 10–49)
ALT: <7 U/L — ABNORMAL LOW (ref 10–49)
AST (SGOT): <8 U/L (ref 0.0–33.9)
AST: <8 U/L (ref 0.0–33.9)
Albumin: 3 gm/dl — ABNORMAL LOW (ref 3.4–5.0)
Albumin: 3 gm/dl — ABNORMAL LOW (ref 3.4–5.0)
Alk. phosphatase: 46 U/L (ref 46–116)
Alkaline Phosphatase: 46 U/L (ref 46–116)
Bilirubin, Direct: 0.2 mg/dl (ref 0.0–0.3)
Bilirubin, direct: 0.2 mg/dl (ref 0.0–0.3)
Bilirubin, total: 0.4 mg/dl (ref 0.30–1.20)
Protein, total: 6.2 gm/dl (ref 5.7–8.2)
Total Bilirubin: 0.4 mg/dl (ref 0.30–1.20)
Total Protein: 6.2 gm/dl (ref 5.7–8.2)

## 2020-11-22 LAB — LIPASE
Lipase: 57 U/L — ABNORMAL HIGH (ref 12–53)
Lipase: 57 U/L — ABNORMAL HIGH (ref 12–53)

## 2020-11-22 LAB — CBC WITH AUTO DIFFERENTIAL
Basophils %: 0.2 % (ref 0–3)
Eosinophils %: 2.8 % (ref 0–5)
Hematocrit: 24.1 % — ABNORMAL LOW (ref 37.0–50.0)
Hemoglobin: 8.6 gm/dl — ABNORMAL LOW (ref 13.0–17.2)
Immature Granulocytes: 0.4 % (ref 0.0–3.0)
Lymphocytes %: 19.9 % — ABNORMAL LOW (ref 28–48)
MCH: 28.8 pg (ref 25.4–34.6)
MCHC: 35.7 gm/dl (ref 30.0–36.0)
MCV: 80.6 fL (ref 80.0–98.0)
MPV: 11.3 fL — ABNORMAL HIGH (ref 6.0–10.0)
Monocytes %: 8 % (ref 1–13)
Neutrophils %: 68.7 % — ABNORMAL HIGH (ref 34–64)
Nucleated RBCs: 0 (ref 0–0)
Platelets: 165 10*3/uL (ref 140–450)
RBC: 2.99 M/uL — ABNORMAL LOW (ref 3.60–5.20)
RDW-SD: 46.1 (ref 36.4–46.3)
WBC: 9.2 10*3/uL (ref 4.0–11.0)

## 2020-11-22 MED ORDER — POTASSIUM CHLORIDE 10 MEQ/100 ML IV PIGGY BACK
10 mEq/0 mL | INTRAVENOUS | Status: AC
Start: 2020-11-22 — End: 2020-11-22
  Administered 2020-11-22 (×4): via INTRAVENOUS

## 2020-11-22 MED ORDER — NS WITH POTASSIUM CHLORIDE 20 MEQ/L IV
20 mEq/L | INTRAVENOUS | Status: AC
Start: 2020-11-22 — End: ?
  Administered 2020-11-22 – 2020-11-23 (×3): via INTRAVENOUS

## 2020-11-22 MED ADMIN — heparin (porcine) injection 5,000 Units: SUBCUTANEOUS | @ 10:00:00 | NDC 71288042295

## 2020-11-22 MED ADMIN — ondansetron (ZOFRAN) injection 4 mg: INTRAVENOUS | @ 19:00:00 | NDC 23155054831

## 2020-11-22 MED ADMIN — morphine injection 2 mg: INTRAVENOUS | @ 23:00:00 | NDC 00409189003

## 2020-11-22 MED ADMIN — morphine injection 2 mg: INTRAVENOUS | @ 07:00:00 | NDC 76045000401

## 2020-11-22 MED ADMIN — morphine injection 2 mg: INTRAVENOUS | @ 14:00:00 | NDC 00409189003

## 2020-11-22 MED ADMIN — morphine injection 2 mg: INTRAVENOUS | @ 03:00:00 | NDC 76045000401

## 2020-11-22 MED ADMIN — piperacillin-tazobactam (ZOSYN) 3.375 g in 0.9% sodium chloride (MBP/ADV) 100 mL MBP: INTRAVENOUS | @ 16:00:00 | NDC 63323098341

## 2020-11-22 MED ADMIN — piperacillin-tazobactam (ZOSYN) 3.375 g in 0.9% sodium chloride (MBP/ADV) 100 mL MBP: INTRAVENOUS | @ 08:00:00 | NDC 63323098341

## 2020-11-22 MED ADMIN — ondansetron (ZOFRAN) injection 4 mg: INTRAVENOUS | @ 03:00:00 | NDC 23155054831

## 2020-11-22 MED ADMIN — LORazepam (ATIVAN) tablet 1 mg: ORAL | @ 07:00:00 | NDC 00904600861

## 2020-11-22 MED ADMIN — piperacillin-tazobactam (ZOSYN) 3.375 g in 0.9% sodium chloride (MBP/ADV) 100 mL MBP: INTRAVENOUS | @ 23:00:00 | NDC 63323098341

## 2020-11-22 MED ADMIN — morphine injection 2 mg: INTRAVENOUS | @ 18:00:00 | NDC 00409189003

## 2020-11-22 MED ADMIN — heparin (porcine) injection 5,000 Units: SUBCUTANEOUS | @ 03:00:00 | NDC 71288042295

## 2020-11-22 MED ADMIN — heparin (porcine) injection 5,000 Units: SUBCUTANEOUS | @ 18:00:00 | NDC 71288042295

## 2020-11-22 MED ADMIN — acetaminophen (TYLENOL) tablet 650 mg: ORAL | NDC 71399802402

## 2020-11-22 MED FILL — PIPERACILLIN-TAZOBACTAM 3.375 GRAM IV SOLR: 3.375 gram | INTRAVENOUS | Qty: 3.38

## 2020-11-22 MED FILL — POTASSIUM CHLORIDE 10 MEQ/100 ML IV PIGGY BACK: 10 mEq/0 mL | INTRAVENOUS | Qty: 100

## 2020-11-22 MED FILL — ONDANSETRON (PF) 4 MG/2 ML INJECTION: 4 mg/2 mL | INTRAMUSCULAR | Qty: 2

## 2020-11-22 MED FILL — MORPHINE 2 MG/ML INJECTION: 2 mg/mL | INTRAMUSCULAR | Qty: 1

## 2020-11-22 MED FILL — NS WITH POTASSIUM CHLORIDE 20 MEQ/L IV: 20 mEq/L | INTRAVENOUS | Qty: 1000

## 2020-11-22 MED FILL — HEPARIN (PORCINE) 5,000 UNIT/ML IJ SOLN: 5000 unit/mL | INTRAMUSCULAR | Qty: 1

## 2020-11-22 MED FILL — LORAZEPAM 1 MG TAB: 1 mg | ORAL | Qty: 1

## 2020-11-22 MED FILL — ACETAMINOPHEN 325 MG TABLET: 325 mg | ORAL | Qty: 2

## 2020-11-22 MED FILL — TYLENOL 325 MG TABLET: 325 mg | ORAL | Qty: 2

## 2020-11-22 NOTE — Progress Notes (Signed)
Medicine Progress Note    Patient: Rhonda Hammond   Age:  30 y.o.  DOA: 11/18/2020     LOS:  LOS: 3 days     Assessment/Plan   Acute on chronic pancreatitis due to EtOH  Polysubstance dependence  Intractable nausea and vomiting  Dehydration due to #3  CT shows less fluid around the pancreas. Pseudocyst is noted.      -Continue IV fluids, decrease rate  -Continue clear liquid diet  -Replace potassium  -Analgesia antiemetics as necessary  -Continue empiric Zosyn  Subjective:   Patient seen and examined.  Better tolerating clear liquid diet had a little bit of nausea without vomiting today, F/C, CP or SOB      Objective:   Visit Vitals  BP (!) 145/103 (BP 1 Location: Right upper arm, BP Patient Position: Sitting)   Pulse 76   Temp 98.1 ??F (36.7 ??C)   Resp 20   Ht 5\' 7"  (1.702 m)   Wt 96.2 kg (212 lb)   SpO2 99%   BMI 33.20 kg/m??       Physical Exam:  General appearance: alert, cooperative, no distress, appears stated age  Head: Normocephalic, without obvious abnormality, atraumatic  Neck: supple, trachea midline  Lungs: clear to auscultation bilaterally  Heart: regular rate and rhythm, S1, S2 normal, no murmur, click, rub or gallop  Abdomen: Soft mild diffuse tenderness throughout   extremities: extremities normal, atraumatic, no cyanosis or edema        Medications Reviewed:  Current Facility-Administered Medications   Medication Dose Route Frequency    0.9% sodium chloride with KCl 20 mEq/L infusion   IntraVENous CONTINUOUS    piperacillin-tazobactam (ZOSYN) 3.375 g in 0.9% sodium chloride (MBP/ADV) 100 mL MBP  3.375 g IntraVENous Q8H    ondansetron (ZOFRAN) injection 4 mg  4 mg IntraVENous Q4H PRN    LORazepam (ATIVAN) tablet 1 mg  1 mg Oral Q1H PRN    Or    LORazepam (ATIVAN) injection 1 mg  1 mg IntraVENous Q1H PRN    LORazepam (ATIVAN) tablet 2 mg  2 mg Oral Q1H PRN    Or    LORazepam (ATIVAN) injection 2 mg  2 mg IntraVENous Q1H PRN    LORazepam (ATIVAN) injection 3 mg  3 mg IntraVENous Q15MIN PRN     naloxone (NARCAN) injection 0.1 mg  0.1 mg IntraVENous PRN    acetaminophen (TYLENOL) tablet 650 mg  650 mg Oral Q4H PRN    Or    acetaminophen (TYLENOL) solution 650 mg  650 mg Oral Q4H PRN    Or    acetaminophen (TYLENOL) suppository 650 mg  650 mg Rectal Q4H PRN    morphine injection 2 mg  2 mg IntraVENous Q4H PRN    diphenhydrAMINE (BENADRYL) injection 25 mg  25 mg IntraVENous Q4H PRN    heparin (porcine) injection 5,000 Units  5,000 Units SubCUTAneous Q8H         Intake and Output:  Current Shift:  11/07 0701 - 11/07 1900  In: 360 [P.O.:360]  Out: -   Last three shifts:  11/05 1901 - 11/07 0700  In: 1320 [P.O.:720; I.V.:600]  Out: -     Lab/Data Reviewed:  All lab and imaging  results for the last 24 hours reviewed.    13/07, MD  P# [phone]  November 22, 2020    [facility] medical dictation software was used for portions of this report.  Unintended voice transcription errors may have  occurred.

## 2020-11-23 LAB — RENAL FUNCTION PANEL
Albumin: 2.8 gm/dl — ABNORMAL LOW (ref 3.4–5.0)
Albumin: 2.8 gm/dl — ABNORMAL LOW (ref 3.4–5.0)
Anion Gap: 9 mmol/L (ref 5–15)
Anion gap: 9 mmol/L (ref 5–15)
BUN: <5 mg/dl — ABNORMAL LOW (ref 9–23)
BUN: <5 mg/dl — ABNORMAL LOW (ref 9–23)
CO2: 24 mEq/L (ref 20–31)
CO2: 24 mEq/L (ref 20–31)
Calcium: 8.4 mg/dl — ABNORMAL LOW (ref 8.7–10.4)
Calcium: 8.4 mg/dl — ABNORMAL LOW (ref 8.7–10.4)
Chloride: 105 mEq/L (ref 98–107)
Chloride: 105 mEq/L (ref 98–107)
Creatinine: 0.42 mg/dl — ABNORMAL LOW (ref 0.55–1.02)
Creatinine: 0.42 mg/dl — ABNORMAL LOW (ref 0.55–1.02)
EGFR IF NonAfrican American: >60
GFR African American: >60
GFR est AA: >60
GFR est non-AA: >60
Glucose: 91 mg/dl (ref 74–106)
Glucose: 91 mg/dl (ref 74–106)
Phosphorus: 3.5 mg/dL (ref 2.4–5.1)
Phosphorus: 3.5 mg/dL (ref 2.4–5.1)
Potassium: 3.7 mEq/L (ref 3.5–5.1)
Potassium: 3.7 mEq/L (ref 3.5–5.1)
Sodium: 138 mEq/L (ref 136–145)
Sodium: 138 mEq/L (ref 136–145)

## 2020-11-23 LAB — CBC WITH AUTOMATED DIFF
BASOPHILS: 0.1 % (ref 0–3)
EOSINOPHILS: 2.7 % (ref 0–5)
HCT: 23.6 % — ABNORMAL LOW (ref 37.0–50.0)
HGB: 8.2 gm/dl — ABNORMAL LOW (ref 13.0–17.2)
IMMATURE GRANULOCYTES: 0.4 % (ref 0.0–3.0)
LYMPHOCYTES: 22.9 % — ABNORMAL LOW (ref 28–48)
MCH: 28.4 pg (ref 25.4–34.6)
MCHC: 34.7 gm/dl (ref 30.0–36.0)
MCV: 81.7 fL (ref 80.0–98.0)
MONOCYTES: 9 % (ref 1–13)
MPV: 11.9 fL — ABNORMAL HIGH (ref 6.0–10.0)
NEUTROPHILS: 64.9 % — ABNORMAL HIGH (ref 34–64)
NRBC: 0 (ref 0–0)
PLATELET: 178 10*3/uL (ref 140–450)
RBC: 2.89 M/uL — ABNORMAL LOW (ref 3.60–5.20)
RDW-SD: 46.8 — ABNORMAL HIGH (ref 36.4–46.3)
WBC: 7.9 10*3/uL (ref 4.0–11.0)

## 2020-11-23 LAB — CBC WITH AUTO DIFFERENTIAL
Basophils %: 0.1 % (ref 0–3)
Eosinophils %: 2.7 % (ref 0–5)
Hematocrit: 23.6 % — ABNORMAL LOW (ref 37.0–50.0)
Hemoglobin: 8.2 gm/dl — ABNORMAL LOW (ref 13.0–17.2)
Immature Granulocytes: 0.4 % (ref 0.0–3.0)
Lymphocytes %: 22.9 % — ABNORMAL LOW (ref 28–48)
MCH: 28.4 pg (ref 25.4–34.6)
MCHC: 34.7 gm/dl (ref 30.0–36.0)
MCV: 81.7 fL (ref 80.0–98.0)
MPV: 11.9 fL — ABNORMAL HIGH (ref 6.0–10.0)
Monocytes %: 9 % (ref 1–13)
Neutrophils %: 64.9 % — ABNORMAL HIGH (ref 34–64)
Nucleated RBCs: 0 (ref 0–0)
Platelets: 178 10*3/uL (ref 140–450)
RBC: 2.89 M/uL — ABNORMAL LOW (ref 3.60–5.20)
RDW-SD: 46.8 — ABNORMAL HIGH (ref 36.4–46.3)
WBC: 7.9 10*3/uL (ref 4.0–11.0)

## 2020-11-23 MED ORDER — ONDANSETRON 4 MG TAB, RAPID DISSOLVE
4 mg | ORAL_TABLET | Freq: Three times a day (TID) | ORAL | 0 refills | Status: DC | PRN
Start: 2020-11-23 — End: 2020-11-24

## 2020-11-23 MED ORDER — OXYCODONE 5 MG TAB
5 mg | ORAL_TABLET | Freq: Four times a day (QID) | ORAL | 0 refills | Status: DC | PRN
Start: 2020-11-23 — End: 2020-11-24

## 2020-11-23 MED ADMIN — morphine injection 2 mg: INTRAVENOUS | @ 19:00:00 | NDC 76045000401

## 2020-11-23 MED ADMIN — morphine injection 2 mg: INTRAVENOUS | @ 11:00:00 | NDC 76045000401

## 2020-11-23 MED ADMIN — ondansetron (ZOFRAN) injection 4 mg: INTRAVENOUS | @ 03:00:00 | NDC 23155054831

## 2020-11-23 MED ADMIN — morphine injection 2 mg: INTRAVENOUS | @ 03:00:00 | NDC 76045000401

## 2020-11-23 MED ADMIN — piperacillin-tazobactam (ZOSYN) 3.375 g in 0.9% sodium chloride (MBP/ADV) 100 mL MBP: INTRAVENOUS | @ 16:00:00 | NDC 63323098341

## 2020-11-23 MED ADMIN — piperacillin-tazobactam (ZOSYN) 3.375 g in 0.9% sodium chloride (MBP/ADV) 100 mL MBP: INTRAVENOUS | @ 09:00:00 | NDC 63323098341

## 2020-11-23 MED ADMIN — morphine injection 2 mg: INTRAVENOUS | @ 07:00:00 | NDC 76045000401

## 2020-11-23 MED ADMIN — heparin (porcine) injection 5,000 Units: SUBCUTANEOUS | @ 03:00:00 | NDC 71288042295

## 2020-11-23 MED ADMIN — morphine injection 2 mg: INTRAVENOUS | @ 15:00:00 | NDC 76045000401

## 2020-11-23 MED ADMIN — heparin (porcine) injection 5,000 Units: SUBCUTANEOUS | @ 11:00:00 | NDC 71288042295

## 2020-11-23 MED FILL — MORPHINE 2 MG/ML INJECTION: 2 mg/mL | INTRAMUSCULAR | Qty: 1

## 2020-11-23 MED FILL — ONDANSETRON (PF) 4 MG/2 ML INJECTION: 4 mg/2 mL | INTRAMUSCULAR | Qty: 2

## 2020-11-23 MED FILL — HEPARIN (PORCINE) 5,000 UNIT/ML IJ SOLN: 5000 unit/mL | INTRAMUSCULAR | Qty: 1

## 2020-11-23 MED FILL — NS WITH POTASSIUM CHLORIDE 20 MEQ/L IV: 20 mEq/L | INTRAVENOUS | Qty: 1000

## 2020-11-23 MED FILL — PIPERACILLIN-TAZOBACTAM 3.375 GRAM IV SOLR: 3.375 gram | INTRAVENOUS | Qty: 3.38

## 2020-11-23 NOTE — Progress Notes (Signed)
Problem: Falls - Risk of  Goal: *Absence of Falls  Description: Document Schmid Fall Risk and appropriate interventions in the flowsheet.  Outcome: Progressing Towards Goal  Note: Fall Risk Interventions:            Medication Interventions: Bed/chair exit alarm, Patient to call before getting OOB, Teach patient to arise slowly    Elimination Interventions: Call light in reach, Bed/chair exit alarm    History of Falls Interventions: Bed/chair exit alarm         Problem: Patient Education: Go to Patient Education Activity  Goal: Patient/Family Education  Outcome: Progressing Towards Goal

## 2020-11-23 NOTE — Discharge Summary (Signed)
DISCHARGE SUMMARY     Patient Name: Rhonda Hammond  Medical Record Number: [id number]  Date of Birth: 11-03-1990  Discharge Provider: Kristie Cowman, MD  Primary Care Provider: None    Admit date: 11/18/2020  Discharge date: 11/23/2020  Discharge Disposition: Home  Code Status: Full Code    Follow-up appointments:  Follow-up Information       Follow up With Specialties Details Why Contact Info    None    None ([phone]) Patient stated that they have no PCP              Follow-up recommendations:  PMD 1 to 2 weeks    Discharge diagnosis:  Acute on chronic pancreatitis due to EtOH  Polysubstance dependence  Intractable nausea and vomiting  Dehydration due to #3  CT shows less fluid around the pancreas. Pseudocyst     Patient with history of previous admissions for pancreatitis due to the alcohol use presented the emergency department with complaint of abdominal pain.  In the emergency department had CT abdomen pelvis that was consistent with pancreatitis and was noted to have an elevated lipase.  This is all due to acute on chronic inflammation of her pancreas.  Due to alcohol use she continues to drink.  Patient was placed n.p.o. and given IV fluids antiemetics and analgesia as needed.  Her condition slowly improved her diet was advanced from liquid to regular and when she was tolerating regular diet with minimal pain she was discharged home, patient counseled on alcohol cessation    History of presenting illness: as per admitting physician   Rhonda Hammond is a 30 year old woman with Hx of chronic pancreatitis and polysubstance dependence including EtOH cocaine and marijuana. She presented to the ED with CO ongoing nausea and vomiting as well as epigastric and back pain similar to previous episodes of pancreatitis. Her symptoms began the day prior to presentation and have been getting worse. Nothing she has tried has made it worse of better. Her lipase is elevated and a CT of the abdomen confirms evidence of inflammation of  the pancreas. Admission is recommended for further management.               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 Max Daily Amount: 20 mg.  Qty: 12 Tablet, Refills: 0  Start date: 11/23/2020, End date: 11/26/2020    Associated Diagnoses: Idiopathic acute pancreatitis with uninfected necrosis           CONTINUE these medications which have CHANGED    Details   ondansetron (  ZOFRAN ODT) 4 mg disintegrating tablet Take 1 Tablet by mouth every eight (8) hours as needed for Nausea or Vomiting.  Qty: 20 Tablet, Refills: 0  Start date: 11/23/2020           CONTINUE these medications which have NOT CHANGED    Details   naproxen sodium (Aleve) 220 mg cap Take 220 mg by mouth as needed.      albuterol (ACCUNEB) 0.63 mg/3 mL nebulizer solution 0.63 mg by Nebulization route every six (6) hours as needed for Wheezing.           STOP taking these medications       sucralfate (Carafate) 1 gram tablet Comments:   Reason for Stopping:               Discharge Diet:  Diet: Regular Diet    Consultants: None    Labs:  Labs: Results:       Chemistry Recent Labs     11/23/20  0416 11/22/20  0608 11/21/20  0237   GLU 91 79 81   NA 138 137 137   K 3.7 3.4* 3.2*   CL 105 102 103   CO2 24 24 26    BUN <5* <5* 5*   CREA 0.42* 0.44* 0.51*   CA 8.4* 8.3* 8.8   AGAP 9 11 8    AP  --  46 49   TP  --  6.2 7.0   ALB 2.8* 3.0*   3.0* 3.3*   3.3*      CBC w/Diff Recent Labs     11/23/20  0416  11/22/20  0608 11/21/20  0237   WBC 7.9 9.2 13.2*   RBC 2.89* 2.99* 3.10*   HGB 8.2* 8.6* 8.7*   HCT 23.6* 24.1* 24.9*   PLT 178 165 174   GRANS 64.9* 68.7* 77.1*   LYMPH 22.9* 19.9* 14.1*   EOS 2.7 2.8 0.9      Cardiac Enzymes No results for input(s): CPK, CKND1, MYO in the last 72 hours.    No lab exists for component: CKRMB, TROIP   Coagulation No results for input(s): PTP, INR, APTT, INREXT in the last 72 hours.    Lipid Panel No results found for: CHOL, CHOLPOCT, CHOLX, CHLST, CHOLV, 884269, HDL, HDLP, LDL, LDLC, DLDLP, 804564, VLDLC, VLDL, TGLX, TRIGL, TRIGP, TGLPOCT, CHHD, CHHDX   BNP No results for input(s): BNPP in the last 72 hours.   Liver Enzymes Recent Labs     11/23/20  0416 11/22/20  0608   TP  --  6.2   ALB 2.8* 3.0*   3.0*   AP  --  46      Thyroid Studies No results found for: T4, T3U, TSH, TSHEXT             Physical exam: not performed on day of discharge     Discharge condition:  stable    Discharge activity and restrictions: Activity as tolerated    Time spent with discharging patient: 99239- total time spent 35 min      13/08/22, MD  11/23/2020  2:10 PM    [employer] medical dictation software was used for portions of this report.  Unintended voice transcription errors may have occurred.

## 2020-11-23 NOTE — Progress Notes (Signed)
Progress Notes by Kristie Cowman, MD at 11/23/20 1410                Author: Kristie Cowman, MD  Service: Hospitalist  Author Type: Physician       Filed: 11/23/20 1410  Date of Service: 11/23/20 1410  Status: Signed          Editor: Kristie Cowman, MD (Physician)                                                         _   November 23, 2020   RE: Rhonda Hammond               To Whom It May Concern,       This is to certify that Rhonda Hammond was admitted to [facility] medical center from 11/18/2020 to November 23, 2020. Upon discharge may return to work November 11,2022 .       Please feel free to contact my office if you have any questions or concerns. Thank you for your assistance in this matter.          Sincerely,    Kristie Cowman MD    [phone]           _________________________________

## 2020-11-24 MED ORDER — OXYCODONE 5 MG TAB
5 mg | ORAL_TABLET | Freq: Four times a day (QID) | ORAL | 0 refills | Status: AC | PRN
Start: 2020-11-24 — End: 2020-11-27

## 2020-11-24 MED ORDER — ONDANSETRON 4 MG TAB, RAPID DISSOLVE
4 mg | ORAL_TABLET | Freq: Three times a day (TID) | ORAL | 0 refills | Status: DC | PRN
Start: 2020-11-24 — End: 2021-01-08

## 2021-01-08 ENCOUNTER — Inpatient Hospital Stay
Admit: 2021-01-08 | Discharge: 2021-01-09 | Disposition: A | Discharge status: Home / Self Care | Payer: PRIVATE HEALTH INSURANCE | Attending: Student in an Organized Health Care Education/Training Program

## 2021-01-08 DIAGNOSIS — R748 Abnormal levels of other serum enzymes: Secondary | ICD-10-CM

## 2021-01-08 DIAGNOSIS — R1013 Epigastric pain: Secondary | ICD-10-CM

## 2021-01-08 LAB — CBC WITH AUTOMATED DIFF
BASOPHILS: 0.2 % (ref 0–3)
EOSINOPHILS: 1.3 % (ref 0–5)
HCT: 33.2 % — ABNORMAL LOW (ref 37.0–50.0)
HGB: 11.8 gm/dl — ABNORMAL LOW (ref 13.0–17.2)
IMMATURE GRANULOCYTES: 0.2 % (ref 0.0–3.0)
LYMPHOCYTES: 23 % — ABNORMAL LOW (ref 28–48)
MCH: 26.8 pg (ref 25.4–34.6)
MCHC: 35.5 gm/dl (ref 30.0–36.0)
MCV: 75.5 fL — ABNORMAL LOW (ref 80.0–98.0)
MONOCYTES: 6.5 % (ref 1–13)
MPV: 10.7 fL — ABNORMAL HIGH (ref 6.0–10.0)
NEUTROPHILS: 68.8 % — ABNORMAL HIGH (ref 34–64)
NRBC: 0 (ref 0–0)
PLATELET: 237 10*3/uL (ref 140–450)
RBC: 4.4 M/uL (ref 3.60–5.20)
RDW-SD: 40.4 (ref 36.4–46.3)
WBC: 8.3 10*3/uL (ref 4.0–11.0)

## 2021-01-08 LAB — POC URINE MACROSCOPIC
Blood, Urine: NEGATIVE
Blood: NEGATIVE
Glucose, Ur: NEGATIVE mg/dl
Glucose: NEGATIVE mg/dl
Nitrite, Urine: NEGATIVE
Nitrites: NEGATIVE
Protein, UA: >=300 mg/dl — AB
Protein: >=300 mg/dl — AB
Specific Gravity, UA: >=1.03 (ref 1.005–1.030)
Specific gravity: >=1.03 (ref 1.005–1.030)
Urobilinogen, UA, POCT: 1 EU/dl (ref 0.0–1.0)
Urobilinogen: 1 EU/dl (ref 0.0–1.0)
pH (UA): 7 (ref 5–9)
pH, UA: 7 (ref 5–9)

## 2021-01-08 LAB — CBC WITH AUTO DIFFERENTIAL
Basophils %: 0.2 % (ref 0–3)
Eosinophils %: 1.3 % (ref 0–5)
Hematocrit: 33.2 % — ABNORMAL LOW (ref 37.0–50.0)
Hemoglobin: 11.8 gm/dl — ABNORMAL LOW (ref 13.0–17.2)
Immature Granulocytes: 0.2 % (ref 0.0–3.0)
Lymphocytes %: 23 % — ABNORMAL LOW (ref 28–48)
MCH: 26.8 pg (ref 25.4–34.6)
MCHC: 35.5 gm/dl (ref 30.0–36.0)
MCV: 75.5 fL — ABNORMAL LOW (ref 80.0–98.0)
MPV: 10.7 fL — ABNORMAL HIGH (ref 6.0–10.0)
Monocytes %: 6.5 % (ref 1–13)
Neutrophils %: 68.8 % — ABNORMAL HIGH (ref 34–64)
Nucleated RBCs: 0 (ref 0–0)
Platelets: 237 10*3/uL (ref 140–450)
RBC: 4.4 M/uL (ref 3.60–5.20)
RDW-SD: 40.4 (ref 36.4–46.3)
WBC: 8.3 10*3/uL (ref 4.0–11.0)

## 2021-01-08 MED ORDER — ONDANSETRON (PF) 4 MG/2 ML INJECTION
4 mg/2 mL | Freq: Once | INTRAMUSCULAR | Status: AC
Start: 2021-01-08 — End: 2021-01-08
  Administered 2021-01-08: via INTRAVENOUS

## 2021-01-08 MED ORDER — SODIUM CHLORIDE 0.9% BOLUS IV
0.9 % | INTRAVENOUS | Status: AC
Start: 2021-01-08 — End: 2021-01-08
  Administered 2021-01-08: via INTRAVENOUS

## 2021-01-08 MED ORDER — MORPHINE 10 MG/ML IJ SOLN
10 mg/mL | INTRAMUSCULAR | Status: AC
Start: 2021-01-08 — End: 2021-01-08
  Administered 2021-01-08: via INTRAVENOUS

## 2021-01-08 MED FILL — MORPHINE 10 MG/ML IJ SOLN: 10 mg/mL | INTRAMUSCULAR | Qty: 1

## 2021-01-08 MED FILL — ONDANSETRON (PF) 4 MG/2 ML INJECTION: 4 mg/2 mL | INTRAMUSCULAR | Qty: 2

## 2021-01-08 NOTE — ED Notes (Signed)
Pt resting in room with blanket. Pt requested the lights be turned off

## 2021-01-08 NOTE — ED Notes (Signed)
Pt sleeping in room, NAD noted

## 2021-01-08 NOTE — ED Notes (Signed)
Discharged by provider

## 2021-01-08 NOTE — ED Notes (Signed)
Pt ambulatory to bathroom and was able to provide urine sample.     Urine dipped and sent upstairs

## 2021-01-08 NOTE — ED Provider Notes (Signed)
ED Provider Notes by Chesley Noon, PA-C at 01/08/21 1900                Author: Chesley Noon, PA-C  Service: Emergency Medicine  Author Type: Physician Assistant       Filed: 01/08/21 2103  Date of Service: 01/08/21 1900  Status: Attested           Editor: Chesley Noon, PA-C (Physician Assistant)  Cosigner: Leonides Cave, DO at 01/09/21 1135          Attestation signed by Leonides Cave, DO at 01/09/21 1135          I have discussed this case with the advanced practice provider. We have reviewed the presentation, pertinent historical and physical exam findings, as well  as relevant  findings. I have been available at all times and agree with the management and disposition documented here.      Leonides Cave, DO   January 09, 2021                              [place]   Emergency Department Treatment Report          Patient: Rhonda Hammond  Age: 30 y.o.  Sex: female          Date of Birth: 04-07-1990  Admit Date: 01/08/2021  PCP: None     MRN: [id number]   CSN: [id number]   Leonides Cave, DO         Room: ER41/ER41  Time Dictated: 7:00 PM  [place]           Chief Complaint         Chief Complaint       Patient presents with        ?  Abdominal Pain             History of Present Illness        This is a 30 y.o. female presenting with what she suspects is alcohol-induced pancreatitis. She is having mostly abdominal pain, some nausea/vomiting.  Patient states pain is now 10/10 in severity radiating to her upper back similar to prior episodes  of pancreatitis.  She does admit that she had some alcohol last night, and the pain is worsened throughout the day today.  Patient denies any fevers hematemesis melena hematochezia or abnormal bowel movements or irritative voiding symptoms.  Presents  for further evaluation to the emergency department      Social Determinants of Health: History of alcohol use/abuse      Additional independent historians: None        Contributing comorbid conditions: None        Review of Systems        Constitutional:  No fevers or chills.  Eyes: No visual symptoms.  ENT: No sore throat, runny nose or ear pain.  Respiratory: No cough, dyspnea or wheezing.  [place]: No chest pain, pressure, palpitations, tightness or heaviness.   Gastrointestinal: As above.  Genitourinary: No dysuria, frequency, or urgency.  Musculoskeletal: No joint pain or swelling.  Integumentary: No rashes.  Neurological: No headaches, sensory or motor symptoms.          Past Medical/Surgical History          Past Medical History:        Diagnosis  Date         ?  Alcohol abuse       ?  Alcohol-induced chronic pancreatitis ([place])  01/28/2020     ?  Cocaine abuse ([place])  06/2019     ?  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  Intractable nausea and vomiting  06/26/2020     ?  Obesity  12/16/2018         ?  Pancreatitis            Past Surgical History:         Procedure  Laterality  Date          ?  HX CESAREAN SECTION                 Social History          Social History          Socioeconomic History         ?  Marital status:  SINGLE       Tobacco Use         ?  Smoking status:  Every Day       Substance and Sexual Activity         ?  Alcohol use:  Yes          Family History          Family History         Problem  Relation  Age of Onset          ?  No Known Problems  Mother                  Current Medications          Current Outpatient Medications          Medication  Sig  Dispense  Refill           ?  oxyCODONE-acetaminophen (Percocet) 5-325 mg per tablet  Take 1 Tablet by mouth every six (6) hours as needed for Pain for up to 3 days. Max Daily Amount: 4 Tablets.  8 Tablet  0     ?  famotidine (Pepcid) 20 mg tablet  Take 1 Tablet by mouth two (2) times a day for 10 days.  20 Tablet  0     ?  ondansetron (ZOFRAN ODT) 4 mg disintegrating tablet  Take 1 Tablet by mouth every eight (8) hours as needed for Nausea or Vomiting.  20 Tablet  0           ?   naproxen sodium (Aleve) 220 mg cap  Take 220 mg by mouth as needed.               ?  albuterol (ACCUNEB) 0.63 mg/3 mL nebulizer solution  0.63 mg by Nebulization route every six (6) hours as needed for Wheezing.                 Allergies          Allergies        Allergen  Reactions         ?  Dilaudid [Hydromorphone]  Itching                Physical Exam        Visit Vitals      BP  125/61     Pulse  81     Temp  98.8 ??F (37.1 ??C)     Resp  17        SpO2  100%  Constitutional: Patient appears well developed and well nourished. Marland Kitchen Appearance and behavior are age and situation appropriate.   Eyes: Conjutivae clear, lids normal. Pupils equal, symmetrical, and normally reactive.   HEENT: Mucous membranes moist, non-erythematous. Surface of the pharynx, palate, and tongue are pink, moist and without lesions.   Neck: supple, non tender, symmetrical, no masses or meningismus.    Respiratory: lungs clear to auscultation, nonlabored respirations. No tachypnea or accessory muscle use.   Cardiovascular: heart regular rate and rhythm without murmur rubs or gallops.    Calves soft and non-tender. Distal pulses 2+ and equal bilaterally.  No peripheral edema or significant variscosities.     Gastrointestinal: Fuhs tenderness across the epigastrium no rebound no guarding no CVA or flank tenderness no lower abdominal tenderness.   Musculoskeletal: Nail beds pink with prompt capillary refill   Integumentary: warm and dry without rashes or lesions   Neurologic: alert and oriented, Sensation intact, motor strength equal and symmetric.  No facial asymmetry or dysarthria.              Impression and Management Plan        Will obtain screening laboratory studies to evaluate for infectious metabolic abnormalities, suspect as does the patient this is likely alcohol induced pancreatitis acute on chronic.  She does wish to follow-up with an outpatient provider.  If laboratory  studies are minimally elevated lipase and no other  acute findings, patient may be able to be treated on an outpatient basis.      Procedures       This patient was evaluated in the emergency department for symptoms described in the history of present illness. They were evaluated in the context of global COVID-19 pandemic, which necessitated consideration of the patient may be at  risk for infection  with the SARS-COV-2 virus that causes COVID-19.  Institutional protocols and algorithms that pertain to the evaluation of patients at risk for COVID-19 are in a state of rapid change based on the information released by regulatory bodies including the  CDC and federal state organizations.  These policies and algorithms were followed during the patient's care in the emergency department.          Diagnostic Studies        Lab/Imaging:         Results for orders placed or performed during the hospital encounter of 01/08/21     CBC WITH AUTOMATED DIFF         Result  Value  Ref Range            WBC  8.3  4.0 - 11.0 1000/mm3       RBC  4.40  3.60 - 5.20 M/uL       HGB  11.8 (L)  13.0 - 17.2 gm/dl       HCT  33.2 (L)  37.0 - 50.0 %       MCV  75.5 (L)  80.0 - 98.0 fL       MCH  26.8  25.4 - 34.6 pg       MCHC  35.5  30.0 - 36.0 gm/dl       PLATELET  237  140 - 450 1000/mm3       MPV  10.7 (H)  6.0 - 10.0 fL       RDW-SD  40.4  36.4 - 46.3         NRBC  0  0 - 0  IMMATURE GRANULOCYTES  0.2  0.0 - 3.0 %       NEUTROPHILS  68.8 (H)  34 - 64 %       LYMPHOCYTES  23.0 (L)  28 - 48 %       MONOCYTES  6.5  1 - 13 %       EOSINOPHILS  1.3  0 - 5 %       BASOPHILS  0.2  0 - 3 %       LIPASE         Result  Value  Ref Range            Lipase  84 (H)  12 - 53 U/L       METABOLIC PANEL, COMPREHENSIVE         Result  Value  Ref Range            Potassium  3.7  3.5 - 5.1 mEq/L       Chloride  108 (H)  98 - 107 mEq/L       Sodium  143  136 - 145 mEq/L       CO2  26  20 - 31 mEq/L       Glucose  97  74 - 106 mg/dl       BUN  <5 (L)  9 - 23 mg/dl       Creatinine  0.63  0.55 - 1.02  mg/dl       GFR est AA  >60.0          GFR est non-AA  >60          Calcium  9.7  8.7 - 10.4 mg/dl       Anion gap  9  5 - 15 mmol/L       AST (SGOT)  18.0  0.0 - 33.9 U/L       ALT (SGPT)  8 (L)  10 - 49 U/L       Alk. phosphatase  52  46 - 116 U/L       Bilirubin, total  0.20 (L)  0.30 - 1.20 mg/dl       Protein, total  8.2  5.7 - 8.2 gm/dl       Albumin  4.4  3.4 - 5.0 gm/dl       MAGNESIUM         Result  Value  Ref Range            Magnesium  1.8  1.6 - 2.6 mg/dL       ETHYL ALCOHOL         Result  Value  Ref Range            ALCOHOL(ETHYL),SERUM  121.1 (H)  0.0 - 3.0 mg/dl       POC URINE MACROSCOPIC         Result  Value  Ref Range            Glucose  Negative  NEGATIVE,Negative mg/dl       Bilirubin  Small (A)  NEGATIVE,Negative         Ketone  Trace (A)  NEGATIVE,Negative mg/dl       Specific gravity  >=1.030  1.005 - 1.030         Blood  Negative  NEGATIVE,Negative         pH (UA)  7.0  5 - 9         Protein  >=  300 (A)  NEGATIVE,Negative mg/dl       Urobilinogen  1.0  0.0 - 1.0 EU/dl       Nitrites  Negative  NEGATIVE,Negative         Leukocyte Esterase  Trace (A)  NEGATIVE,Negative         Color  Yellow          Appearance  Clear          POC URINE MICROSCOPIC         Result  Value  Ref Range            Epithelial cells, squamous  10-14  /LPF       WBC  5-9  /HPF       RBC  OCCASIONAL  /HPF       Bacteria  2+  /HPF       POC HCG,URINE         Result  Value  Ref Range            HCG urine, QL  negative  NEGATIVE,Negative,negative                EKG:           ED Course/Additional MDM        Urine appears to be contaminated, patient with no irritative voiding symptoms.  Lipase slightly elevated, will treat empirically for early exacerbation of pancreatitis presumptively with Percocet, Pepcid in case this is alcohol gastritis.  Follow up with  your family doctor or provider given.  Return or return with new or worsening symptoms, or any concerns.  Patient comfortable with this plan.  She is not vomiting.   Clear liquid diet for 48 hours return precautions and close follow-up with gastroenterology  groups provided      Admission considered? No                Medications       sodium chloride 0.9 % bolus infusion 1,000 mL (0 mL IntraVENous IV Completed 01/08/21 2058)     ondansetron (ZOFRAN) injection 4 mg (4 mg IntraVENous Given 01/08/21 1847)     morphine 10 mg/mL injection 6 mg (6 mg IntraVENous Given 01/08/21 1847)       acetaminophen (OFIRMEV) infusion 1,000 mg (0 mg IntraVENous IV Completed 01/08/21 2101)             Final Diagnosis          Encounter Diagnoses              ICD-10-CM  ICD-9-CM          1.  Elevated lipase   R74.8  790.5          2.  Abdominal pain, epigastric   R10.13  789.06              Disposition        Patient was discharged home in stable condition with discharge instructions on the same.       Return to the ER if condition worsens or new symptoms develop.    Follow up with primary care as discussed.       The patient was personally evaluated by myself and discussed with [facility], KENNETH, DO who agrees with the above assessment and plan.         Chesley Noon, PA-C   January 08, 2021      My signature above authenticates this document and my orders, the final  diagnosis (es), discharge prescription (s), and instructions in the Epic     record.   If you have any questions please contact [phone].       Nursing notes have been reviewed by the physician/ advanced practice     Clinician.

## 2021-01-08 NOTE — ED Notes (Signed)
Pt comes by ems for abdominal pain- extensive hx of pancreatitis

## 2021-01-09 LAB — METABOLIC PANEL, COMPREHENSIVE
ALT (SGPT): 8 U/L — ABNORMAL LOW (ref 10–49)
AST (SGOT): 18 U/L (ref 0.0–33.9)
Albumin: 4.4 gm/dl (ref 3.4–5.0)
Alk. phosphatase: 52 U/L (ref 46–116)
Anion gap: 9 mmol/L (ref 5–15)
BUN: <5 mg/dl — ABNORMAL LOW (ref 9–23)
Bilirubin, total: 0.2 mg/dl — ABNORMAL LOW (ref 0.30–1.20)
CO2: 26 mEq/L (ref 20–31)
Calcium: 9.7 mg/dl (ref 8.7–10.4)
Chloride: 108 mEq/L — ABNORMAL HIGH (ref 98–107)
Creatinine: 0.63 mg/dl (ref 0.55–1.02)
GFR est AA: >60
GFR est non-AA: >60
Glucose: 97 mg/dl (ref 74–106)
Potassium: 3.7 mEq/L (ref 3.5–5.1)
Protein, total: 8.2 gm/dl (ref 5.7–8.2)
Sodium: 143 mEq/L (ref 136–145)

## 2021-01-09 LAB — POC URINE MICROSCOPIC

## 2021-01-09 LAB — LIPASE
Lipase: 84 U/L — ABNORMAL HIGH (ref 12–53)
Lipase: 84 U/L — ABNORMAL HIGH (ref 12–53)

## 2021-01-09 LAB — MAGNESIUM
Magnesium: 1.8 mg/dL (ref 1.6–2.6)
Magnesium: 1.8 mg/dL (ref 1.6–2.6)

## 2021-01-09 LAB — ETHYL ALCOHOL
ALCOHOL(ETHYL),SERUM: 121.1 mg/dl — ABNORMAL HIGH (ref 0.0–3.0)
Ethyl Alcohol: 121.1 mg/dl — ABNORMAL HIGH (ref 0.0–3.0)

## 2021-01-09 LAB — POC HCG,URINE
HCG urine, QL: NEGATIVE
Pregnancy Test(Urn): NEGATIVE

## 2021-01-09 LAB — COMPREHENSIVE METABOLIC PANEL
ALT: 8 U/L — ABNORMAL LOW (ref 10–49)
AST: 18 U/L (ref 0.0–33.9)
Albumin: 4.4 gm/dl (ref 3.4–5.0)
Alkaline Phosphatase: 52 U/L (ref 46–116)
Anion Gap: 9 mmol/L (ref 5–15)
BUN: <5 mg/dl — ABNORMAL LOW (ref 9–23)
CO2: 26 mEq/L (ref 20–31)
Calcium: 9.7 mg/dl (ref 8.7–10.4)
Chloride: 108 mEq/L — ABNORMAL HIGH (ref 98–107)
Creatinine: 0.63 mg/dl (ref 0.55–1.02)
EGFR IF NonAfrican American: >60
GFR African American: >60
Glucose: 97 mg/dl (ref 74–106)
Potassium: 3.7 mEq/L (ref 3.5–5.1)
Sodium: 143 mEq/L (ref 136–145)
Total Bilirubin: 0.2 mg/dl — ABNORMAL LOW (ref 0.30–1.20)
Total Protein: 8.2 gm/dl (ref 5.7–8.2)

## 2021-01-09 MED ORDER — ACETAMINOPHEN 1,000 MG/100 ML (10 MG/ML) IV
1000 mg/100 mL (10 mg/mL) | Freq: Once | INTRAVENOUS | Status: AC
Start: 2021-01-09 — End: 2021-01-08
  Administered 2021-01-09: 02:00:00 via INTRAVENOUS

## 2021-01-09 MED ORDER — OXYCODONE-ACETAMINOPHEN 5 MG-325 MG TAB
5-325 mg | ORAL_TABLET | Freq: Four times a day (QID) | ORAL | 0 refills | Status: AC | PRN
Start: 2021-01-09 — End: 2021-01-11

## 2021-01-09 MED ORDER — ONDANSETRON 4 MG TAB, RAPID DISSOLVE
4 mg | ORAL_TABLET | Freq: Three times a day (TID) | ORAL | 0 refills | Status: AC | PRN
Start: 2021-01-09 — End: 2021-02-15

## 2021-01-09 MED ORDER — FAMOTIDINE 20 MG TAB
20 mg | ORAL_TABLET | Freq: Two times a day (BID) | ORAL | 0 refills | Status: AC
Start: 2021-01-09 — End: 2021-01-18

## 2021-01-09 MED FILL — ACETAMINOPHEN 1,000 MG/100 ML (10 MG/ML) IV: 1000 mg/100 mL (10 mg/mL) | INTRAVENOUS | Qty: 100

## 2021-02-15 ENCOUNTER — Inpatient Hospital Stay
Admit: 2021-02-15 | Discharge: 2021-02-16 | Disposition: A | Discharge status: Home / Self Care | Payer: PRIVATE HEALTH INSURANCE | Attending: Student in an Organized Health Care Education/Training Program

## 2021-02-15 LAB — METABOLIC PANEL, COMPREHENSIVE
ALT (SGPT): 9 U/L — ABNORMAL LOW (ref 10–49)
AST (SGOT): 16 U/L (ref 0.0–33.9)
Albumin: 3.8 gm/dl (ref 3.4–5.0)
Alk. phosphatase: 48 U/L (ref 46–116)
Anion gap: 10 mmol/L (ref 5–15)
BUN: 5 mg/dl — ABNORMAL LOW (ref 9–23)
Bilirubin, total: 0.5 mg/dl (ref 0.30–1.20)
CO2: 24 mEq/L (ref 20–31)
Calcium: 9.8 mg/dl (ref 8.7–10.4)
Chloride: 107 mEq/L (ref 98–107)
Creatinine: 0.48 mg/dl — ABNORMAL LOW (ref 0.55–1.02)
GFR est AA: >60
GFR est non-AA: >60
Glucose: 94 mg/dl (ref 74–106)
Potassium: 4.1 mEq/L (ref 3.5–5.1)
Protein, total: 7.6 gm/dl (ref 5.7–8.2)
Sodium: 140 mEq/L (ref 136–145)

## 2021-02-15 LAB — POC URINE MACROSCOPIC
Glucose, Ur: NEGATIVE mg/dl
Glucose: NEGATIVE mg/dl
Ketone: 15 mg/dl — AB
Ketones, Urine: 15 mg/dl — AB
Nitrite, Urine: NEGATIVE
Nitrites: NEGATIVE
Protein, UA: 30 mg/dl — AB
Protein: 30 mg/dl — AB
Specific Gravity, UA: 1.02 (ref 1.005–1.030)
Specific gravity: 1.02 (ref 1.005–1.030)
Urobilinogen, UA, POCT: 1 EU/dl (ref 0.0–1.0)
Urobilinogen: 1 EU/dl (ref 0.0–1.0)
pH (UA): 7 (ref 5–9)
pH, UA: 7 (ref 5–9)

## 2021-02-15 LAB — POC URINE MICROSCOPIC

## 2021-02-15 LAB — CBC WITH AUTOMATED DIFF
BASOPHILS: 0.3 % (ref 0–3)
EOSINOPHILS: 2.5 % (ref 0–5)
HCT: 30 % — ABNORMAL LOW (ref 37.0–50.0)
HGB: 10.4 gm/dl — ABNORMAL LOW (ref 13.0–17.2)
IMMATURE GRANULOCYTES: 0.3 % (ref 0.0–3.0)
LYMPHOCYTES: 19 % — ABNORMAL LOW (ref 28–48)
MCH: 25.6 pg (ref 25.4–34.6)
MCHC: 34.7 gm/dl (ref 30.0–36.0)
MCV: 73.9 fL — ABNORMAL LOW (ref 80.0–98.0)
MONOCYTES: 8.1 % (ref 1–13)
MPV: 11.6 fL — ABNORMAL HIGH (ref 6.0–10.0)
NEUTROPHILS: 69.8 % — ABNORMAL HIGH (ref 34–64)
NRBC: 0 (ref 0–0)
PLATELET: 220 10*3/uL (ref 140–450)
RBC: 4.06 M/uL (ref 3.60–5.20)
RDW-SD: 41.4 (ref 36.4–46.3)
WBC: 7.9 10*3/uL (ref 4.0–11.0)

## 2021-02-15 LAB — LIPASE
Lipase: 38 U/L (ref 12–53)
Lipase: 38 U/L (ref 12–53)

## 2021-02-15 LAB — POC HCG,URINE
HCG urine, QL: NEGATIVE
Pregnancy Test(Urn): NEGATIVE

## 2021-02-15 LAB — COMPREHENSIVE METABOLIC PANEL
ALT: 9 U/L — ABNORMAL LOW (ref 10–49)
AST: 16 U/L (ref 0.0–33.9)
Albumin: 3.8 gm/dl (ref 3.4–5.0)
Alkaline Phosphatase: 48 U/L (ref 46–116)
Anion Gap: 10 mmol/L (ref 5–15)
BUN: 5 mg/dl — ABNORMAL LOW (ref 9–23)
CO2: 24 mEq/L (ref 20–31)
Calcium: 9.8 mg/dl (ref 8.7–10.4)
Chloride: 107 mEq/L (ref 98–107)
Creatinine: 0.48 mg/dl — ABNORMAL LOW (ref 0.55–1.02)
EGFR IF NonAfrican American: >60
GFR African American: >60
Glucose: 94 mg/dl (ref 74–106)
Potassium: 4.1 mEq/L (ref 3.5–5.1)
Sodium: 140 mEq/L (ref 136–145)
Total Bilirubin: 0.5 mg/dl (ref 0.30–1.20)
Total Protein: 7.6 gm/dl (ref 5.7–8.2)

## 2021-02-15 LAB — CBC WITH AUTO DIFFERENTIAL
Basophils %: 0.3 % (ref 0–3)
Eosinophils %: 2.5 % (ref 0–5)
Hematocrit: 30 % — ABNORMAL LOW (ref 37.0–50.0)
Hemoglobin: 10.4 gm/dl — ABNORMAL LOW (ref 13.0–17.2)
Immature Granulocytes: 0.3 % (ref 0.0–3.0)
Lymphocytes %: 19 % — ABNORMAL LOW (ref 28–48)
MCH: 25.6 pg (ref 25.4–34.6)
MCHC: 34.7 gm/dl (ref 30.0–36.0)
MCV: 73.9 fL — ABNORMAL LOW (ref 80.0–98.0)
MPV: 11.6 fL — ABNORMAL HIGH (ref 6.0–10.0)
Monocytes %: 8.1 % (ref 1–13)
Neutrophils %: 69.8 % — ABNORMAL HIGH (ref 34–64)
Nucleated RBCs: 0 (ref 0–0)
Platelets: 220 10*3/uL (ref 140–450)
RBC: 4.06 M/uL (ref 3.60–5.20)
RDW-SD: 41.4 (ref 36.4–46.3)
WBC: 7.9 10*3/uL (ref 4.0–11.0)

## 2021-02-15 MED ORDER — SODIUM CHLORIDE 0.9 % IJ SYRG
Freq: Once | INTRAMUSCULAR | Status: AC
Start: 2021-02-15 — End: 2021-02-15
  Administered 2021-02-15: 20:00:00 via INTRAVENOUS

## 2021-02-15 MED ORDER — ONDANSETRON (PF) 4 MG/2 ML INJECTION
4 mg/2 mL | Freq: Once | INTRAMUSCULAR | Status: AC
Start: 2021-02-15 — End: 2021-02-15
  Administered 2021-02-15: 20:00:00 via INTRAVENOUS

## 2021-02-15 MED ORDER — MORPHINE 4 MG/ML SYRINGE
4 mg/mL | INTRAMUSCULAR | Status: AC
Start: 2021-02-15 — End: 2021-02-15
  Administered 2021-02-15: 20:00:00 via INTRAVENOUS

## 2021-02-15 MED ORDER — SODIUM CHLORIDE 0.9 % IRRIGATION SOLN
0.9 % | Freq: Once | Status: AC
Start: 2021-02-15 — End: 2021-02-15
  Administered 2021-02-15: 23:00:00 via RECTAL

## 2021-02-15 MED ORDER — PROMETHAZINE IN NS 12.5 MG/50 ML IV PIGGY BAG
12.5 mg/50 ml | INTRAVENOUS | Status: AC
Start: 2021-02-15 — End: 2021-02-15
  Administered 2021-02-15: 23:00:00 via INTRAVENOUS

## 2021-02-15 MED ORDER — MORPHINE 4 MG/ML SYRINGE
4 mg/mL | INTRAMUSCULAR | Status: AC
Start: 2021-02-15 — End: 2021-02-15
  Administered 2021-02-15: 23:00:00 via INTRAVENOUS

## 2021-02-15 MED FILL — ONDANSETRON (PF) 4 MG/2 ML INJECTION: 4 mg/2 mL | INTRAMUSCULAR | Qty: 2

## 2021-02-15 MED FILL — MORPHINE 4 MG/ML SYRINGE: 4 mg/mL | INTRAMUSCULAR | Qty: 1

## 2021-02-15 MED FILL — GLYCERIN 99.5 % TOPICAL SOLN: 99.5 % | CUTANEOUS | Qty: 125

## 2021-02-15 MED FILL — PROMETHAZINE IN NS 12.5 MG/50 ML IV PIGGY BAG: 12.5 mg/50 ml | INTRAVENOUS | Qty: 50

## 2021-02-15 NOTE — Progress Notes (Signed)
Attempted to round on pt. Pt with provider.

## 2021-02-15 NOTE — ED Notes (Signed)
Pt arrives via EMS from home with complaints of abdominal pain x 10 days.     Pt got gall bladder removed on Saturday. Pt has had pain since, went to [facility] yesterday and was d/c. Patient arrives today with N, V, and abdominal pain

## 2021-02-15 NOTE — ED Provider Notes (Signed)
[place]  Emergency Department Treatment Report    Patient: Rhonda Hammond Age: 31 y.o. Sex: female    Date of Birth: 1990/06/12 Admit Date: 02/15/2021 PCP: None   MRN: [id number]  CSN: [id number]     Room: ER07/ER07 Time Dictated: 3:03 PM      Payor: [place] / Plan: [place] [employer] DUAL / Product Type: Managed Care Medicaid /     Chief Complaint   Chief Complaint   Patient presents with    Vomiting    Abdominal Pain       History of Present Illness   This is a 31 y.o. female alcohol abuse and previous episodes of alcohol induced pancreatitis, obesity, and recent acute cholecystitis with cholecystectomy on 02/10/21 presents via ambulance for complaint of ongoing abdominal pain, nausea, and vomiting.  She reports abdominal pain started prior to her cholecystectomy and has not significantly improved since her surgery.  She reports having been seen again at [facility] yesterday where they did labs and a repeat CT scan which were largely unremarkable.  The CT scan did show some edema and inflammation around the proximal duodenum that was felt to be secondary to mild reactive duodenitis with significant evidence of constipation.  She was counseled on the importance of using laxative medications and told to take MiraLAX.  She reports that she has been able to tolerate this medication due to ongoing nausea and vomiting.  She describes abdominal pain as "sharp and cramping" in the generalized abdomen.  She reports some radiation of pain into her lower back.  She reports severity as "10/10" currently and "10/10" at its worst.     She reports abdominal pain is aggravated by eating and general activity.  She reports abdominal pain is not improved by anything.  She reports associated nausea and vomiting.  She denies associated diarrhea or blood in her stool.  She reports significant associated constipation with report that her last bowel movement  was at least 4 days ago if not longer.  She denies associated urinary symptoms.  She denies any associated fever.      She reports similar episodes of abdominal pain in the past week since her surgery.  She reports that she is currently not on any opioid pain medications and is only using Tylenol and Motrin.  She reports prior abdominal surgery with a cesarean section in addition to her recent cholecystectomy.  Review of records is also notable for the patient having history of alcohol induced pancreatitis and polysubstance abuse.    Review of Systems   Review of Systems   Constitutional:  Negative for chills, fever and malaise/fatigue.   HENT:  Negative for congestion, sinus pain and sore throat.    Eyes:  Negative for blurred vision and double vision.   Respiratory:  Positive for cough. Negative for hemoptysis, sputum production and shortness of breath.    Cardiovascular:  Positive for leg swelling (chronic). Negative for chest pain and palpitations.   Gastrointestinal:  Negative for abdominal pain, blood in stool, constipation, diarrhea, nausea and vomiting.   Genitourinary:  Negative for dysuria, flank pain, frequency, hematuria and urgency.   Musculoskeletal:  Positive for back pain. Negative for neck pain.   Skin:  Negative for itching and rash.   Neurological:  Negative for dizziness, sensory change, focal weakness and headaches.   Endo/Heme/Allergies:  Does not bruise/bleed easily.   Psychiatric/Behavioral:  Positive for substance abuse (  current tobacco use and recent alcohol use). Negative for depression. The patient is nervous/anxious.      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     Intractable nausea and vomiting 06/26/2020    Obesity 12/16/2018    Pancreatitis      Past Surgical History:   Procedure Laterality Date    HX  CESAREAN SECTION      HX CHOLECYSTECTOMY       Social History     Social History     Socioeconomic History    Marital status: SINGLE   Tobacco Use    Smoking status: Every Day   Substance and Sexual Activity    Alcohol use: Yes    Drug use: Not Currently       Family History     Family History   Problem Relation Age of Onset    No Known Problems Mother        Current Medications     Current Facility-Administered Medications   Medication Dose Route Frequency Provider Last Rate Last Admin    sodium chloride (NS) flush 5-10 mL  5-10 mL IntraVENous ONCE Leonides Cave, DO        ondansetron [facility]) injection 4 mg  4 mg IntraVENous ONCE Leonides Cave, DO        morphine injection 4 mg  4 mg IntraVENous NOW Leonides Cave, DO         Current Outpatient Medications   Medication Sig Dispense Refill    ondansetron (ZOFRAN ODT) 4 mg disintegrating tablet Take 1 Tablet by mouth every eight (8) hours as needed for Nausea or Vomiting. 20 Tablet 0    naproxen sodium (Aleve) 220 mg cap Take 220 mg by mouth as needed.      albuterol (ACCUNEB) 0.63 mg/3 mL nebulizer solution 0.63 mg by Nebulization route every six (6) hours as needed for Wheezing.         Allergies     Allergies   Allergen Reactions    Dilaudid [Hydromorphone] Itching       Physical Exam     ED Triage Vitals [02/15/21 1452]   ED Encounter Vitals Group      BP (!) 173/116      Pulse (Heart Rate) 86      Resp Rate 18      Temp 98.8 ??F (37.1 ??C)      Temp src       O2 Sat (%) 100 %      Weight 223 lb      Height 5' 7"      Patient Vitals for the past 24 hrs:   Temp Pulse Resp BP SpO2   02/15/21 1452 98.8 ??F (37.1 ??C) 86 18 (!) 173/116 100 %     I have reviewed documented vital signs, which are within age-appropriate parameters.   I have reviewed nursing documentation.    Physical Exam  Vitals and nursing note reviewed.   Constitutional:       General: She is in acute distress (moderate painful distress with anxiety).   HENT:      Head: Normocephalic and  atraumatic.      Right Ear: External ear normal.      Left Ear: External ear normal.  Nose: Nose normal.      Mouth/Throat:      Mouth: Mucous membranes are moist.      Pharynx: Oropharynx is clear.   Eyes:      Conjunctiva/sclera: Conjunctivae normal.   Cardiovascular:      Rate and Rhythm: Normal rate and regular rhythm.      Heart sounds: No murmur heard.  Pulmonary:      Effort: Pulmonary effort is normal. No respiratory distress.      Breath sounds: Normal breath sounds.   Abdominal:      General: Bowel sounds are normal. There is no distension.      Palpations: Abdomen is soft.      Tenderness: There is generalized abdominal tenderness. There is no right CVA tenderness, left CVA tenderness, guarding or rebound.   Musculoskeletal:         General: No tenderness or deformity.      Right lower leg: No edema.      Left lower leg: No edema.   Skin:     General: Skin is warm and dry.      Findings: No rash.   Neurological:      General: No focal deficit present.      Mental Status: She is alert and oriented to person, place, and time.   Psychiatric:         Mood and Affect: Mood is anxious.         Behavior: Behavior normal.     Impression and Management Plan   This is a 31 y.o. female with recent cholecystectomy that was reportedly uncomplicated who is presented now for the second time with complaints of ongoing abdominal pain, nausea, and vomiting.  She was seen yesterday at an outside facility and was told that her CT was consistent with significant constipation without other acute abnormalities.  She was counseled on the importance of taking antiemetics as well as laxative medications.  She returns stating that she is unable to tolerate MiraLAX which she was prescribed due to ongoing nausea and vomiting.  We will treat her pain at this time as well as her nausea and vomiting and will obtain a plain film to rule out evidence of ileus or small bowel obstruction.  We will repeat labs to include a CBC and CMP.   The patient will likely require enema but we do not feel that she needs further imaging with CT scans of the abdomen and pelvis at this time.    Differential diagnoses: Ileus, small bowel obstruction, obstipation, fecal impaction, complications of recent surgery including intra-abdominal abscess, duodenitis, and various etiologies of colitis, as well as pain seeking behavior in patient with a history of polysubstance abuse among others.    Diagnostic Studies   Lab:   Recent Results (from the past 24 hour(s))   CBC WITH AUTOMATED DIFF    Collection Time: 02/15/21  3:22 PM   Result Value Ref Range    WBC 7.9 4.0 - 11.0 1000/mm3    RBC 4.06 3.60 - 5.20 M/uL    HGB 10.4 (L) 13.0 - 17.2 gm/dl    HCT 30.0 (L) 37.0 - 50.0 %    MCV 73.9 (L) 80.0 - 98.0 fL    MCH 25.6 25.4 - 34.6 pg    MCHC 34.7 30.0 - 36.0 gm/dl    PLATELET 220 140 - 450 1000/mm3    MPV 11.6 (H) 6.0 - 10.0 fL    RDW-SD 41.4 36.4 - 46.3  NRBC 0 0 - 0      IMMATURE GRANULOCYTES 0.3 0.0 - 3.0 %    NEUTROPHILS 69.8 (H) 34 - 64 %    LYMPHOCYTES 19.0 (L) 28 - 48 %    MONOCYTES 8.1 1 - 13 %    EOSINOPHILS 2.5 0 - 5 %    BASOPHILS 0.3 0 - 3 %   METABOLIC PANEL, COMPREHENSIVE    Collection Time: 02/15/21  3:22 PM   Result Value Ref Range    Potassium 4.1 3.5 - 5.1 mEq/L    Chloride 107 98 - 107 mEq/L    Sodium 140 136 - 145 mEq/L    CO2 24 20 - 31 mEq/L    Glucose 94 74 - 106 mg/dl    BUN 5 (L) 9 - 23 mg/dl    Creatinine 0.48 (L) 0.55 - 1.02 mg/dl    GFR est AA >60.0      GFR est non-AA >60      Calcium 9.8 8.7 - 10.4 mg/dl    Anion gap 10 5 - 15 mmol/L    AST (SGOT) 16.0 0.0 - 33.9 U/L    ALT (SGPT) 9 (L) 10 - 49 U/L    Alk. phosphatase 48 46 - 116 U/L    Bilirubin, total 0.50 0.30 - 1.20 mg/dl    Protein, total 7.6 5.7 - 8.2 gm/dl    Albumin 3.8 3.4 - 5.0 gm/dl   LIPASE    Collection Time: 02/15/21  3:22 PM   Result Value Ref Range    Lipase 38 12 - 53 U/L   POC URINE MACROSCOPIC    Collection Time: 02/15/21  3:46 PM   Result Value Ref Range    Glucose  Negative NEGATIVE,Negative mg/dl    Bilirubin Small (A) NEGATIVE,Negative      Ketone 15 (A) NEGATIVE,Negative mg/dl    Specific gravity 1.020 1.005 - 1.030      Blood Large (A) NEGATIVE,Negative      pH (UA) 7.0 5 - 9      Protein 30 (A) NEGATIVE,Negative mg/dl    Urobilinogen 1.0 0.0 - 1.0 EU/dl    Nitrites Negative NEGATIVE,Negative      Leukocyte Esterase Trace (A) NEGATIVE,Negative      Color Amber      Appearance Cloudy     POC HCG,URINE    Collection Time: 02/15/21  3:46 PM   Result Value Ref Range    HCG urine, QL negative NEGATIVE,Negative,negative       Based on my interpretation the CBC shows mild anemia without other significant acute abnormalities.  Based on my interpretation the CMP shows no evidence of significant acute abnormalities.  Based on my interpretation the urinalysis shows large blood and ketones consistent with likely dehydration and a negative urine pregnancy test.  I have reviewed all ordered labs and used them in my medical decision making (MDM) as documented below.    Imaging:    XR ABD (KUB)    Result Date: 02/15/2021  INDICATION: R/O SBO PROCEDURE: XR ABD (KUB) COMPARISON: 11/20/2020 FINDINGS: Normal intestinal gas pattern. No evidence of bowel obstruction. No free air identified.     IMPRESSION: Unremarkable abdomen. No evidence of bowel obstruction. Electronically signed by: Lenise Arena, MD 02/15/2021 3:40 PM EST      Based on my interpretation the abdominal plain film shows no evidence of bowel obstruction.    I have reviewed all ordered imaging studies and used them in my medical decision making (MDM)  as documented below.    Cardiac Monitor Rhythm Strip Interpretation     Ordered in this patient due to abdominal pain in the setting of recent cholecystectomy.  My interpretation is that the cardiac monitor shows sinus rhythm at a rate of 88 beats per minute.    Leonides Cave, DO  February 15, 2021    ED Course     RECORDS REVIEWED: I reviewed the patient's previous records here  at [facility] and available outside facilities and note that she was admitted for pancreatitis back in November 2022 at which time it was noted that the patient continue to use alcohol, tobacco, and marijuana.  She returned to this hospital on 01/09/21 with ongoing abdominal pain issues and elevation of the lipase.  She was counseled to follow-up for likely need for cholecystectomy at some point.    EXTERNAL RESULTS REVIEWED: I reviewed notes from [facility] where she presented on 02/10/21 where she presented with acute cholecystitis.  She was treated with laparoscopic cholecystectomy and then returned yesterday 02/14/21 for ongoing pain complaints and had repeat labs and CT scan and was discharged with counseling on use of laxative medications as they felt that she was having ongoing pain from constipation.    SOCIAL DETERMINANTS impacting Evaluation and Management: Health Services as the patient has had difficulty with follow-up after her recent cholecystectomy does not seem to be able to get in with her primary care provider to discuss additional treatment of constipation in the setting of this recent procedure.  Personal Health Habits and Adaptability as the patient has continued to have alcohol and drug use despite her history of recurrent episodes of pancreatitis.    Comorbidities impacting Evaluation and Management: Chronic abdominal pain    Medical Decision Making   Chief Complaint: Vomiting, Abdominal Pain    31 y.o. female with abdominal pain, nausea, and vomiting with history and exam consistent with postoperative constipation likely secondary to opioid pain medication use.    Initial considerations in this patient included ileus, small bowel obstruction, obstipation, fecal impaction, complications of recent surgery including intra-abdominal abscess, duodenitis, and various etiologies of colitis, as well as pain seeking behavior in patient with a history of polysubstance abuse among  others.    Patient presented with abdominal pain in setting of recent cholecystectomy likely secondary to constipation issues.  The patient had been seen the day before with a repeat CT scan showing no evidence of acute intra-abdominal processes but with some findings consistent with reactive duodenitis.  The patient was noted to have no focal tenderness on exam.  The patient was noted to have no evidence of peritonitis.  She reported that it been more than 4 days since her last bowel movement and she was unable to tolerate laxative medications due to ongoing nausea and vomiting.  Labs obtained and noted to be grossly unremarkable.  The patient was noted to have no fever or other clear infectious symptoms.  A plain film of the abdomen was obtained with no evidence of bowel obstruction or findings suggestive of ileus.  Patient was felt to have likely pain secondary to constipation with noted ability to tolerate food and fluids by mouth after treatment with antiemetics in the ED.  We discussed plan to treat with enema and if symptoms improve would likely be able to discharge her home with follow-up with general surgery as previously scheduled.    Planned admission to ED observation for treatment with enema was discussed with the patient who  demonstrated understanding and agreement with this plan.    Medications   sodium chloride (NS) flush 5-10 mL (has no administration in time range)   ondansetron (ZOFRAN) injection 4 mg (has no administration in time range)   morphine injection 4 mg (has no administration in time range)       Final Diagnosis       ICD-10-CM ICD-9-CM   1. Abdominal pain, generalized  R10.84 789.07   2. Drug-induced constipation  K59.03 564.09     E980.5   3. Nausea and vomiting, unspecified vomiting type  R11.2 787.01       Disposition   Admitted to ED Observation for constipation and abdominal pain, nausea and vomiting noted to not be intractable.         Leonides Cave, DO  February 15, 2021    My  signature above authenticates this document and my orders, the final    diagnosis (es), discharge prescription (s), and instructions in the Epic    record.  If you have any questions please contact [phone].     Nursing notes have been reviewed by the physician/ advanced practice    Clinician.    Dragon medical dictation software was used for portions of this report. Unintended voice recognition grammatical errors may occur.

## 2021-02-15 NOTE — ED Notes (Signed)
Pt ambulated to restroom and had large bowel movement. Back in bed, on monitor, and no further needs at this time.

## 2021-02-15 NOTE — ED Notes (Signed)
7:31 PM  02/15/21     Discharge instructions given to [place]  (name) with verbalization of understanding. Patient accompanied by patient.  Patient discharged with the following prescriptions   Current Discharge Medication List        START taking these medications    Details   polyethylene glycol (Miralax) 17 gram/dose powder Take 17 g by mouth two (2) times a day. 1 tablespoon with 8 oz of water daily.  Hold for loose or watery bowel movements.  Qty: 116 g, Refills: 0  Start date: 02/15/2021      oxyCODONE-acetaminophen (Percocet) 5-325 mg per tablet Take 1 Tablet by mouth every six (6) hours as needed for Pain for up to 3 days. Max Daily Amount: 4 Tablets.  Qty: 12 Tablet, Refills: 0  Start date: 02/15/2021, End date: 02/18/2021    Associated Diagnoses: Abdominal pain, generalized      ondansetron (ZOFRAN ODT) 4 mg disintegrating tablet Take 1 Tablet by mouth every eight (8) hours as needed for Nausea or Vomiting.  Qty: 20 Tablet, Refills: 0  Start date: 02/15/2021      promethazine (PHENERGAN) 25 mg suppository Insert 1 Suppository into rectum every six (6) hours as needed for Nausea for up to 7 days.  Qty: 12 Suppository, Refills: 0  Start date: 02/15/2021, End date: 02/22/2021          . Patient discharged to Home (destination).      Donia Ast, RN

## 2021-02-15 NOTE — ED Provider Notes (Signed)
[facility]  Emergency ObservationDepartment   Discharge Summary    Patient: Rhonda Hammond Age: 31 y.o. Sex: female    Date of Birth: 08-26-1990 Admit Date: 02/15/2021 PCP: None   MRN: [id number]  CSN: [id number]     Room: ER07/ER07       ED Physician   Johny Shock DO    Discharge Physician   Johny Shock DO    Observation Admission   Placed into ED observation for abdominal pain in setting of likely drug-induced constipation in the setting of recent cholecystectomy, 02/15/21 @ 4:21 PM    Observation Discharge  Abdominal pain and drug-induced constipation, 02/15/21 @ 7:30 PM    History of Present Illness   31 y.o. female patient placed into ED observation to receive enema for drug-induced constipation in setting of recent cholecystectomy.    ED Observation Course     7:15 PM  Reassessed patient who notes significant improvement in abdominal pain at this time.  She reports she has had a large bowel movement.  She did have additional nausea and vomiting but this seems to have improved at this time.  We discussed plan to discharge her home with plan to continue MiraLAX twice daily until she is having loose or watery bowel movements.  Patient demonstrated understanding and agreement with this plan.  We did discuss starting her on a short course of opioid analgesics, but cautioned her that it is important to keep taking water and MiraLAX as these will exacerbate constipation.  The patient demonstrated understanding and agreement with this recommendation prior to discharge.    MORE THAN 30 MINUTES OF TIME WAS SPENT ON DISCHARGE SERVICES  Physical Exam   Visit Vitals  BP (!) 154/92   Pulse 86   Temp 98.8 ??F (37.1 ??C)   Resp 18   Ht 5\' 7"  (1.702 m)   Wt 101.2 kg (223 lb)   SpO2 98%   BMI 34.93 kg/m??      Physical Exam  Vitals and nursing note reviewed.   Constitutional:       General: She is resting comfortably at this time.   HENT:      Mouth/Throat:      Mouth: Mucous membranes are moist.       Pharynx: Oropharynx is clear.   Eyes:      Conjunctiva/sclera: Conjunctivae normal.   Cardiovascular:      Rate and Rhythm: Normal rate and regular rhythm.      Heart sounds: No murmur heard.  Pulmonary:      Effort: Pulmonary effort is normal. No respiratory distress.      Breath sounds: Normal breath sounds.   Abdominal:      General: Bowel sounds are normal. There is no distension.      Palpations: Abdomen is soft.      Tenderness: There is mild generalized abdominal tenderness. There is no right CVA tenderness, left CVA tenderness, guarding or rebound.   Musculoskeletal:         General: No tenderness or deformity.      Right lower leg: No edema.      Left lower leg: No edema.   Skin:     General: Skin is warm and dry.      Findings: No rash.   Neurological:      General: No focal deficit present.      Mental Status: She is alert and oriented to person, place, and time.   Psychiatric:  Mood and Affect: Mood is normal.         Behavior: Behavior normal.     Clinical Impression/diagnosis       ICD-10-CM ICD-9-CM   1. Abdominal pain, generalized  R10.84 789.07   2. Drug-induced constipation  K59.03 564.09     E980.5   3. Nausea and vomiting, unspecified vomiting type  R11.2 787.01         Disposition and Plan   Patient is discharged home in stable condition. Advised to follow up with PCP.  Patient advised to return for any new or worsening symptoms.        Current Discharge Medication List        START taking these medications    Details   polyethylene glycol (Miralax) 17 gram/dose powder Take 17 g by mouth two (2) times a day. 1 tablespoon with 8 oz of water daily.  Hold for loose or watery bowel movements.  Qty: 116 g, Refills: 0  Start date: 02/15/2021      oxyCODONE-acetaminophen (Percocet) 5-325 mg per tablet Take 1 Tablet by mouth every six (6) hours as needed for Pain for up to 3 days. Max Daily Amount: 4 Tablets.  Qty: 12 Tablet, Refills: 0  Start date: 02/15/2021, End date: 02/18/2021    Associated  Diagnoses: Abdominal pain, generalized      ondansetron (ZOFRAN ODT) 4 mg disintegrating tablet Take 1 Tablet by mouth every eight (8) hours as needed for Nausea or Vomiting.  Qty: 20 Tablet, Refills: 0  Start date: 02/15/2021      promethazine (PHENERGAN) 25 mg suppository Insert 1 Suppository into rectum every six (6) hours as needed for Nausea for up to 7 days.  Qty: 12 Suppository, Refills: 0  Start date: 02/15/2021, End date: 02/22/2021             [facility] medical dictation software was used for portions of this report. Unintended errors may occur.    Johny Shock, DO  February 15, 2021

## 2021-02-16 MED ORDER — POLYETHYLENE GLYCOL 3350 100 % ORAL POWDER
17 gram/dose | Freq: Two times a day (BID) | ORAL | 0 refills | Status: AC
Start: 2021-02-16 — End: ?

## 2021-02-16 MED ORDER — PROMETHAZINE 25 MG RECTAL SUPPOSITORY
25 mg | Freq: Four times a day (QID) | RECTAL | 0 refills | Status: DC | PRN
Start: 2021-02-16 — End: 2021-02-21

## 2021-02-16 MED ORDER — ONDANSETRON 4 MG TAB, RAPID DISSOLVE
4 mg | ORAL_TABLET | Freq: Three times a day (TID) | ORAL | 0 refills | Status: DC | PRN
Start: 2021-02-16 — End: 2021-02-21

## 2021-02-16 MED ORDER — OXYCODONE-ACETAMINOPHEN 5 MG-325 MG TAB
5-325 mg | ORAL_TABLET | Freq: Four times a day (QID) | ORAL | 0 refills | Status: AC | PRN
Start: 2021-02-16 — End: 2021-02-18

## 2021-02-21 ENCOUNTER — Inpatient Hospital Stay
Admit: 2021-02-21 | Discharge: 2021-02-21 | Disposition: A | Discharge status: Home / Self Care | Payer: PRIVATE HEALTH INSURANCE | Attending: Emergency Medicine

## 2021-02-21 DIAGNOSIS — R1013 Epigastric pain: Secondary | ICD-10-CM

## 2021-02-21 LAB — METABOLIC PANEL, COMPREHENSIVE
ALT (SGPT): 12 U/L (ref 10–49)
AST (SGOT): 16 U/L (ref 0.0–33.9)
Albumin: 4 gm/dl (ref 3.4–5.0)
Alk. phosphatase: 56 U/L (ref 46–116)
Anion gap: 7 mmol/L (ref 5–15)
BUN: 5 mg/dl — ABNORMAL LOW (ref 9–23)
Bilirubin, total: 0.3 mg/dl (ref 0.30–1.20)
CO2: 23 mEq/L (ref 20–31)
Calcium: 9.2 mg/dl (ref 8.7–10.4)
Chloride: 109 mEq/L — ABNORMAL HIGH (ref 98–107)
Creatinine: 0.6 mg/dl (ref 0.55–1.02)
GFR est AA: >60
GFR est non-AA: >60
Glucose: 112 mg/dl — ABNORMAL HIGH (ref 74–106)
Potassium: 3.6 mEq/L (ref 3.5–5.1)
Protein, total: 7.7 gm/dl (ref 5.7–8.2)
Sodium: 139 mEq/L (ref 136–145)

## 2021-02-21 LAB — CBC WITH AUTOMATED DIFF
BASOPHILS: 0.2 % (ref 0–3)
EOSINOPHILS: 0.6 % (ref 0–5)
HCT: 30.9 % — ABNORMAL LOW (ref 37.0–50.0)
HGB: 10.6 gm/dl — ABNORMAL LOW (ref 13.0–17.2)
IMMATURE GRANULOCYTES: 0.3 % (ref 0.0–3.0)
LYMPHOCYTES: 13.7 % — ABNORMAL LOW (ref 28–48)
MCH: 25.2 pg — ABNORMAL LOW (ref 25.4–34.6)
MCHC: 34.3 gm/dl (ref 30.0–36.0)
MCV: 73.4 fL — ABNORMAL LOW (ref 80.0–98.0)
MONOCYTES: 6.4 % (ref 1–13)
MPV: 11.4 fL — ABNORMAL HIGH (ref 6.0–10.0)
NEUTROPHILS: 78.8 % — ABNORMAL HIGH (ref 34–64)
NRBC: 0 (ref 0–0)
PLATELET: 302 10*3/uL (ref 140–450)
RBC: 4.21 M/uL (ref 3.60–5.20)
RDW-SD: 41.1 (ref 36.4–46.3)
WBC: 9.8 10*3/uL (ref 4.0–11.0)

## 2021-02-21 LAB — GLUCOSE, POC: Glucose (POC): 124 mg/dL — ABNORMAL HIGH (ref 65–105)

## 2021-02-21 LAB — LIPASE
Lipase: 28 U/L (ref 12–53)
Lipase: 28 U/L (ref 12–53)

## 2021-02-21 LAB — COMPREHENSIVE METABOLIC PANEL
ALT: 12 U/L (ref 10–49)
AST: 16 U/L (ref 0.0–33.9)
Albumin: 4 gm/dl (ref 3.4–5.0)
Alkaline Phosphatase: 56 U/L (ref 46–116)
Anion Gap: 7 mmol/L (ref 5–15)
BUN: 5 mg/dl — ABNORMAL LOW (ref 9–23)
CO2: 23 mEq/L (ref 20–31)
Calcium: 9.2 mg/dl (ref 8.7–10.4)
Chloride: 109 mEq/L — ABNORMAL HIGH (ref 98–107)
Creatinine: 0.6 mg/dl (ref 0.55–1.02)
EGFR IF NonAfrican American: >60
GFR African American: >60
Glucose: 112 mg/dl — ABNORMAL HIGH (ref 74–106)
Potassium: 3.6 mEq/L (ref 3.5–5.1)
Sodium: 139 mEq/L (ref 136–145)
Total Bilirubin: 0.3 mg/dl (ref 0.30–1.20)
Total Protein: 7.7 gm/dl (ref 5.7–8.2)

## 2021-02-21 LAB — CBC WITH AUTO DIFFERENTIAL
Basophils %: 0.2 % (ref 0–3)
Eosinophils %: 0.6 % (ref 0–5)
Hematocrit: 30.9 % — ABNORMAL LOW (ref 37.0–50.0)
Hemoglobin: 10.6 gm/dl — ABNORMAL LOW (ref 13.0–17.2)
Immature Granulocytes: 0.3 % (ref 0.0–3.0)
Lymphocytes %: 13.7 % — ABNORMAL LOW (ref 28–48)
MCH: 25.2 pg — ABNORMAL LOW (ref 25.4–34.6)
MCHC: 34.3 gm/dl (ref 30.0–36.0)
MCV: 73.4 fL — ABNORMAL LOW (ref 80.0–98.0)
MPV: 11.4 fL — ABNORMAL HIGH (ref 6.0–10.0)
Monocytes %: 6.4 % (ref 1–13)
Neutrophils %: 78.8 % — ABNORMAL HIGH (ref 34–64)
Nucleated RBCs: 0 (ref 0–0)
Platelets: 302 10*3/uL (ref 140–450)
RBC: 4.21 M/uL (ref 3.60–5.20)
RDW-SD: 41.1 (ref 36.4–46.3)
WBC: 9.8 10*3/uL (ref 4.0–11.0)

## 2021-02-21 LAB — POCT GLUCOSE: POC Glucose: 124 mg/dL — ABNORMAL HIGH (ref 65–105)

## 2021-02-21 MED ORDER — DICYCLOMINE 20 MG TAB
20 mg | ORAL_TABLET | Freq: Four times a day (QID) | ORAL | 0 refills | Status: AC | PRN
Start: 2021-02-21 — End: ?

## 2021-02-21 MED ORDER — DIPHENHYDRAMINE HCL 50 MG/ML IJ SOLN
50 mg/mL | INTRAMUSCULAR | Status: AC
Start: 2021-02-21 — End: 2021-02-21
  Administered 2021-02-21: 20:00:00 via INTRAVENOUS

## 2021-02-21 MED ORDER — PROCHLORPERAZINE EDISYLATE 5 MG/ML INJECTION
5 mg/mL | INTRAMUSCULAR | Status: AC
Start: 2021-02-21 — End: 2021-02-21
  Administered 2021-02-21: 22:00:00 via INTRAVENOUS

## 2021-02-21 MED ORDER — DICYCLOMINE 10 MG/ML IM SOLN
10 mg/mL | INTRAMUSCULAR | Status: AC
Start: 2021-02-21 — End: 2021-02-21
  Administered 2021-02-21: 23:00:00 via INTRAMUSCULAR

## 2021-02-21 MED ORDER — ONDANSETRON 4 MG TAB, RAPID DISSOLVE
4 mg | ORAL_TABLET | Freq: Three times a day (TID) | ORAL | 0 refills | Status: AC | PRN
Start: 2021-02-21 — End: ?

## 2021-02-21 MED ORDER — SODIUM CHLORIDE 0.9% BOLUS IV
0.9 % | INTRAVENOUS | Status: AC
Start: 2021-02-21 — End: 2021-02-21
  Administered 2021-02-21: 20:00:00 via INTRAVENOUS

## 2021-02-21 MED ORDER — DICYCLOMINE 10 MG/ML IM SOLN
10 mg/mL | INTRAMUSCULAR | Status: DC
Start: 2021-02-21 — End: 2021-02-21

## 2021-02-21 MED ORDER — DROPERIDOL 2.5 MG/ML IJ SOLN
2.5 mg/mL | Freq: Once | INTRAMUSCULAR | Status: AC
Start: 2021-02-21 — End: 2021-02-21
  Administered 2021-02-21: 20:00:00 via INTRAVENOUS

## 2021-02-21 MED ORDER — PROMETHAZINE 25 MG RECTAL SUPPOSITORY
25 mg | Freq: Four times a day (QID) | RECTAL | 0 refills | Status: AC | PRN
Start: 2021-02-21 — End: 2021-02-28

## 2021-02-21 MED FILL — DROPERIDOL 2.5 MG/ML IJ SOLN: 2.5 mg/mL | INTRAMUSCULAR | Qty: 2

## 2021-02-21 MED FILL — DIPHENHYDRAMINE HCL 50 MG/ML IJ SOLN: 50 mg/mL | INTRAMUSCULAR | Qty: 1

## 2021-02-21 MED FILL — PROCHLORPERAZINE EDISYLATE 5 MG/ML INJECTION: 5 mg/mL | INTRAMUSCULAR | Qty: 2

## 2021-02-21 MED FILL — DICYCLOMINE 10 MG/ML IM SOLN: 10 mg/mL | INTRAMUSCULAR | Qty: 2

## 2021-02-21 NOTE — ED Notes (Signed)
Pt came up to triage nurse asking howm uch longer, pt diaphoretic and endorses chills with NV.  VS retaken and [id number]    PT reports she is in a lot of paoin

## 2021-02-21 NOTE — ED Notes (Signed)
I have reviewed discharge instructions with the patient.  The patient verbalized understanding.   Current Discharge Medication List        START taking these medications    Details   dicyclomine (BENTYL) 20 mg tablet Take 1 Tablet by mouth every six (6) hours as needed for Abdominal Cramps.  Qty: 20 Tablet, Refills: 0  Start date: 02/21/2021           CONTINUE these medications which have CHANGED    Details   ondansetron (ZOFRAN ODT) 4 mg disintegrating tablet Take 1 Tablet by mouth every eight (8) hours as needed for Nausea or Vomiting.  Qty: 20 Tablet, Refills: 0  Start date: 02/21/2021      promethazine (PHENERGAN) 25 mg suppository Insert 1 Suppository into rectum every six (6) hours as needed for Nausea for up to 7 days.  Qty: 12 Suppository, Refills: 0  Start date: 02/21/2021, End date: 02/28/2021

## 2021-02-21 NOTE — Progress Notes (Signed)
Rounded on Pt. Pt resting in recliner, room lights off. No needs or concerns expressed at this time.

## 2021-02-21 NOTE — ED Provider Notes (Signed)
[place]  Emergency Department Treatment Report        Patient: Rhonda Hammond Age: 31 y.o. Sex: female    Date of Birth: [birth year] Admit Date: 02/21/2021 PCP: None   MRN: [id number]  CSN: [id number]     Room: ER45/ER45 Time Dictated: 4:25 PM            Chief Complaint   Chief Complaint   Patient presents with    Abdominal Pain       History of Present Illness   This is a 31 y.o. female who presents complaining of abdominal pain nausea and vomiting.  It began 2 nights ago.  She says that she has a history of alcoholic pancreatitis.  She also has had her gallbladder out because she keeps having episodes of intense epigastric abdominal pain coupled with nausea and vomiting.  She has had recurrent symptoms since last night.  She cannot keep anything down  Rates her pain is severe  This is the same as previous episodes    Review of Systems   Review of Systems   Constitutional:  Negative for chills and fever.   Respiratory:  Negative for shortness of breath.    Cardiovascular:  Negative for chest pain.   Gastrointestinal:  Positive for abdominal pain, nausea and vomiting. Negative for diarrhea.   Genitourinary:  Negative for dysuria.   Psychiatric/Behavioral:  Negative for substance abuse.      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    Cocaine abuse ([place]) 06/2019    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     Intractable nausea and vomiting 06/26/2020    Obesity 12/16/2018    Pancreatitis      Past Surgical History:   Procedure Laterality Date    HX CESAREAN SECTION      HX CHOLECYSTECTOMY           Social History     Social History     Socioeconomic History    Marital status: SINGLE     Spouse name: Not on file    Number of children: Not on file    Years of education: Not on file    Highest education level: Not on file   Occupational History    Not on file   Tobacco Use    Smoking  status: Every Day    Smokeless tobacco: Not on file   Substance and Sexual Activity    Alcohol use: Yes    Drug use: Not Currently    Sexual activity: Not on file   Other Topics Concern    Not on file   Social History Narrative    Not on file     Social Determinants of Health     Financial Resource Strain: Not on file   Food Insecurity: Not on file   Transportation Needs: Not on file   Physical Activity: Not on file   Stress: Not on file   Social Connections: Not on file   Intimate Partner Violence: Not on file   Housing Stability: Not on file         Family History     Family History   Problem Relation Age of Onset    No Known Problems Mother          Current Medications     Current Outpatient Medications   Medication  Sig Dispense Refill    ondansetron (ZOFRAN ODT) 4 mg disintegrating tablet Take 1 Tablet by mouth every eight (8) hours as needed for Nausea or Vomiting. 20 Tablet 0    promethazine (PHENERGAN) 25 mg suppository Insert 1 Suppository into rectum every six (6) hours as needed for Nausea for up to 7 days. 12 Suppository 0    dicyclomine (BENTYL) 20 mg tablet Take 1 Tablet by mouth every six (6) hours as needed for Abdominal Cramps. 20 Tablet 0    polyethylene glycol (Miralax) 17 gram/dose powder Take 17 g by mouth two (2) times a day. 1 tablespoon with 8 oz of water daily.  Hold for loose or watery bowel movements. 116 g 0    naproxen sodium (Aleve) 220 mg cap Take 220 mg by mouth as needed.      albuterol (ACCUNEB) 0.63 mg/3 mL nebulizer solution 0.63 mg by Nebulization route every six (6) hours as needed for Wheezing.         Allergies     Allergies   Allergen Reactions    Dilaudid [Hydromorphone] Itching       Physical Exam   Patient Vitals for the past 24 hrs:   Temp Pulse Resp BP SpO2   02/21/21 1436 97.9 ??F (36.6 ??C) 85 -- (!) 143/90 100 %   02/21/21 1337 98.7 ??F (37.1 ??C) 72 18 (!) 158/96 99 %     Physical Exam  Constitutional:       General: She is in acute distress.   HENT:      Head:  Normocephalic and atraumatic.   Eyes:      General: No scleral icterus.  Cardiovascular:      Rate and Rhythm: Regular rhythm. Tachycardia present.   Pulmonary:      Effort: Pulmonary effort is normal. No respiratory distress.      Breath sounds: Normal breath sounds.   Abdominal:      Palpations: Abdomen is soft.      Comments: Moderate epigastric abdominal tenderness to palpation without peritonitis    The rest of the abdomen is soft and nontender   Skin:     General: Skin is warm and dry.   Neurological:      General: No focal deficit present.      Mental Status: She is alert.   Psychiatric:         Mood and Affect: Mood is anxious.      Comments: Anxious and tearful due to pain        Impression and Management Plan   Patient with an acute exacerbation of chronic abdominal pain nausea and vomiting  Differential diagnoses pancreatitis, gastritis, cyclic vomiting syndrome    Diagnostic Studies   Lab:   Results for orders placed or performed during the hospital encounter of 82/95/62   METABOLIC PANEL, COMPREHENSIVE   Result Value Ref Range    Potassium 3.6 3.5 - 5.1 mEq/L    Chloride 109 (H) 98 - 107 mEq/L    Sodium 139 136 - 145 mEq/L    CO2 23 20 - 31 mEq/L    Glucose 112 (H) 74 - 106 mg/dl    BUN 5 (L) 9 - 23 mg/dl    Creatinine 0.60 0.55 - 1.02 mg/dl    GFR est AA >60.0      GFR est non-AA >60      Calcium 9.2 8.7 - 10.4 mg/dl    Anion gap 7 5 - 15 mmol/L  AST (SGOT) 16.0 0.0 - 33.9 U/L    ALT (SGPT) 12 10 - 49 U/L    Alk. phosphatase 56 46 - 116 U/L    Bilirubin, total 0.30 0.30 - 1.20 mg/dl    Protein, total 7.7 5.7 - 8.2 gm/dl    Albumin 4.0 3.4 - 5.0 gm/dl   LIPASE   Result Value Ref Range    Lipase 28 12 - 53 U/L   CBC WITH AUTOMATED DIFF   Result Value Ref Range    WBC 9.8 4.0 - 11.0 1000/mm3    RBC 4.21 3.60 - 5.20 M/uL    HGB 10.6 (L) 13.0 - 17.2 gm/dl    HCT 30.9 (L) 37.0 - 50.0 %    MCV 73.4 (L) 80.0 - 98.0 fL    MCH 25.2 (L) 25.4 - 34.6 pg    MCHC 34.3 30.0 - 36.0 gm/dl    PLATELET 302 140 - 450  1000/mm3    MPV 11.4 (H) 6.0 - 10.0 fL    RDW-SD 41.1 36.4 - 46.3      NRBC 0 0 - 0      IMMATURE GRANULOCYTES 0.3 0.0 - 3.0 %    NEUTROPHILS 78.8 (H) 34 - 64 %    LYMPHOCYTES 13.7 (L) 28 - 48 %    MONOCYTES 6.4 1 - 13 %    EOSINOPHILS 0.6 0 - 5 %    BASOPHILS 0.2 0 - 3 %   GLUCOSE, POC   Result Value Ref Range    Glucose (POC) 124 (H) 65 - 105 mg/dL     Imaging:    No results found.    EKG:       Prehospital EKG:       Cardiac Monitor Interpretation:       Imaging: Based on my personal interpretation, the    shows       Other studies:  My interpretation of other studies is that they show, among other things,       ED Course         EXTERNAL RECORDS REVIEWED:  I reviewed the patient's previous records here at [facility] and available outside facilities and note that she was last seen in the [facility] emergency department on 30 January.  She was seen by Dr. Rolene Arbour.  At that time it was felt that she was suffering from constipation and a UTI and was sent home for outpatient follow-up    PREVIOUS RESULTS REVIEWED: CT scan performed on 30 January that showed postoperative changes with some inflammation around the proximal duodenum        Severe exacerbation or progression of chronic illness: This is a severe exacerbation of her chronic abdominal pain and recurrent intractable nausea and vomiting          SOCIAL DETERMINANTS  impacting Evaluation and Management: Lack of access to reliable outpatient follow-up            Medications   sodium chloride 0.9 % bolus infusion 1,000 mL (0 mL IntraVENous IV Completed 02/21/21 1703)   droperidoL (INAPSINE) injection 1.25 mg (1.25 mg IntraVENous Given 02/21/21 1511)   diphenhydrAMINE (BENADRYL) injection 12.5 mg (12.5 mg IntraVENous Given 02/21/21 1511)   prochlorperazine (COMPAZINE) injection 10 mg (10 mg IntraVENous Given 02/21/21 1703)           NARRATIVE:  She remained stable.  She has no peritonitis.  She has a normal white count.  She has normal labs otherwise.  Normal lipase.  She was given  IV droperidol and Benadryl and Phenergan.  She has been sleeping.  She is calm.  She looks well.    I have prescribed Phenergan and Zofran and Bentyl          Medical Decision Making         I considered the following testing, treatment, or disposition:   Considered a CT scan but decided not to pursue due to her positive response to medications here and her recent CT scan that showed nothing emergent    I considered prescribing narcotic pain medicine and decided that the risks outweighed the benefits for this acute exacerbation of what is a chronic problem    I have answered all questions.  I have discussed my recommendations for follow-up and reasons to return.  I have encouraged early return should worrisome signs or symptoms develop or if there is any worsening or unanticipated persistence of their presenting condition.  I have discussed any limitations to their daily activities and the treatment plan for at home.      Final Diagnosis       ICD-10-CM ICD-9-CM   1. Abdominal pain, acute, epigastric  R10.13 789.06     338.19   2. Intractable vomiting with nausea  R11.2 536.2   3. Dehydration  E86.0 276.51   4. Alcohol use disorder, moderate, dependence ([place])  F10.20 303.90       Disposition   home    Jones Viviani A. Dennison Bulla, MD F-ACEP  February 21, 2021    My signature above authenticates this document and my orders, the final    diagnosis (es), discharge prescription (s), and instructions in the Epic    record.  If you have any questions please contact [phone].     Nursing notes have been reviewed by the physician/ advanced practice    Clinician.

## 2021-02-21 NOTE — ED Notes (Signed)
Patient here via EMS for complaints of abdominal pain. Patient had her gallbladder removed 10 days ago. A&O x4.

## 2021-04-20 ENCOUNTER — Emergency Department: Admit: 2021-04-20 | Payer: PRIVATE HEALTH INSURANCE

## 2021-04-20 ENCOUNTER — Inpatient Hospital Stay
Admit: 2021-04-20 | Discharge: 2021-04-20 | Disposition: A | Discharge status: Home / Self Care | Payer: PRIVATE HEALTH INSURANCE | Attending: Emergency Medicine

## 2021-04-20 DIAGNOSIS — R1013 Epigastric pain: Secondary | ICD-10-CM

## 2021-04-20 LAB — COMPREHENSIVE METABOLIC PANEL
ALT: 9 U/L — ABNORMAL LOW (ref 10–49)
AST: 19 U/L (ref 0.0–33.9)
Albumin: 4.3 gm/dl (ref 3.4–5.0)
Alkaline Phosphatase: 66 U/L (ref 46–116)
Anion Gap: 7 mmol/L (ref 5–15)
BUN: <5 mg/dl — ABNORMAL LOW (ref 9–23)
CO2: 23 mEq/L (ref 20–31)
Calcium: 9.7 mg/dl (ref 8.7–10.4)
Chloride: 109 mEq/L — ABNORMAL HIGH (ref 98–107)
Creatinine: 0.67 mg/dl (ref 0.55–1.02)
GFR African American: >60
GFR Non-African American: >60
Glucose: 101 mg/dl (ref 74–106)
Potassium: 4.1 mEq/L (ref 3.5–5.1)
Sodium: 139 mEq/L (ref 136–145)
Total Bilirubin: 0.5 mg/dl (ref 0.30–1.20)
Total Protein: 8.5 gm/dl — ABNORMAL HIGH (ref 5.7–8.2)

## 2021-04-20 LAB — CBC WITH AUTO DIFFERENTIAL
Basophils: 0.1 % (ref 0–3)
Eosinophils: 1.7 % (ref 0–5)
Hematocrit: 33.4 % — ABNORMAL LOW (ref 37.0–50.0)
Hemoglobin: 11.4 gm/dl — ABNORMAL LOW (ref 13.0–17.2)
Immature Granulocytes: 0.1 % (ref 0.0–3.0)
Lymphocytes: 23.5 % — ABNORMAL LOW (ref 28–48)
MCH: 25.4 pg (ref 25.4–34.6)
MCHC: 34.1 gm/dl (ref 30.0–36.0)
MCV: 74.6 fL — ABNORMAL LOW (ref 80.0–98.0)
MPV: 10.5 fL — ABNORMAL HIGH (ref 6.0–10.0)
Monocytes: 8.1 % (ref 1–13)
Neutrophils Segmented: 66.5 % — ABNORMAL HIGH (ref 34–64)
Nucleated RBCs: 0 (ref 0–0)
Platelets: 262 10*3/uL (ref 140–450)
RBC: 4.48 M/uL (ref 3.60–5.20)
RDW: 44.6 (ref 36.4–46.3)
WBC: 7 10*3/uL (ref 4.0–11.0)

## 2021-04-20 LAB — POCT URINALYSIS DIPSTICK
Blood, Urine: NEGATIVE
Glucose, Ur: NEGATIVE mg/dl
Ketones, Urine: NEGATIVE mg/dl
Leukocyte Esterase, Urine: NEGATIVE
Nitrite, Urine: NEGATIVE
Protein, Urine: 30 mg/dl — AB
Specific Gravity, Urine: >=1.03 (ref 1.005–1.030)
Urobilinogen, Urine: 0.2 EU/dl (ref 0.0–1.0)
pH, Urine: 5.5 (ref 5–9)

## 2021-04-20 LAB — POC PREGNANCY UR-QUAL: Pregnancy, Urine: NEGATIVE

## 2021-04-20 LAB — LIPASE: Lipase: 61 U/L — ABNORMAL HIGH (ref 12–53)

## 2021-04-20 MED ORDER — SODIUM CHLORIDE 0.9 % IV BOLUS
0.9 % | Freq: Once | INTRAVENOUS | Status: AC
Start: 2021-04-20 — End: 2021-04-20
  Administered 2021-04-20: 16:00:00 1000 mL via INTRAVENOUS

## 2021-04-20 MED ORDER — ONDANSETRON HCL 4 MG/2ML IJ SOLN
4 MG/2ML | Freq: Once | INTRAMUSCULAR | Status: AC
Start: 2021-04-20 — End: 2021-04-20
  Administered 2021-04-20: 16:00:00 4 mg via INTRAVENOUS

## 2021-04-20 MED ORDER — IOPAMIDOL 61 % IV SOLN
61 % | Freq: Once | INTRAVENOUS | Status: AC | PRN
Start: 2021-04-20 — End: 2021-04-20
  Administered 2021-04-20: 19:00:00 80 mL via INTRAVENOUS

## 2021-04-20 MED ORDER — MORPHINE SULFATE 4 MG/ML IJ SOLN
4 MG/ML | INTRAMUSCULAR | Status: AC
Start: 2021-04-20 — End: 2021-04-20
  Administered 2021-04-20: 16:00:00 4 mg via INTRAVENOUS

## 2021-04-20 MED ORDER — MORPHINE SULFATE 4 MG/ML IJ SOLN
4 MG/ML | INTRAMUSCULAR | Status: AC
Start: 2021-04-20 — End: 2021-04-20
  Administered 2021-04-20: 20:00:00 4 mg via INTRAVENOUS

## 2021-04-20 MED FILL — MORPHINE SULFATE 4 MG/ML IJ SOLN: 4 mg/mL | INTRAMUSCULAR | Qty: 1

## 2021-04-20 MED FILL — ONDANSETRON HCL 4 MG/2ML IJ SOLN: 4 MG/2ML | INTRAMUSCULAR | Qty: 2

## 2021-04-20 MED FILL — ISOVUE-300 61 % IV SOLN: 61 % | INTRAVENOUS | Qty: 80

## 2021-04-20 NOTE — ED Notes (Signed)
Pt. Reports she has had transverse upper abd pain x 1 day. Reports she had 3 very strong alcoholic drinks yesterday. Concerned that it is a flair up of pancreatitis.      Theodosia Blender, RN  04/20/21 1204

## 2021-04-20 NOTE — ED Notes (Signed)
Patient returned from CT, states she is still in pain, provider made aware     Gibson Ramp, RN  04/20/21 [phone]

## 2021-04-20 NOTE — Discharge Instructions (Signed)
Liquid diet for 3 days.  Abstain from alcohol.  Do not drink any alcohol at all.

## 2021-04-20 NOTE — ED Triage Notes (Signed)
Patient here via EMS for complaints of abdominal pain for 1 day. A&O x4.

## 2021-04-20 NOTE — ED Provider Notes (Signed)
[facility]  Emergency Department Treatment Report        Patient: Rhonda Hammond Age: 31 y.o. Sex: female    Date of Birth: 07-Nov-1990 Admit Date: 04/20/2021 PCP: None None   MRN: [id number]  CSN: [id number]     Room: ER45/ER45 Time Dictated: 4:36 PM            Chief Complaint   Chief Complaint   Patient presents with    Abdominal Pain       History of Present Illness   31 y.o. female with history of alcohol induced pancreatitis as well as status post cholecystectomy in January 2022 presents with complaint of epigastric pain and pain across upper abdomen with nausea and vomiting since last night.  She does admit to having 1 alcoholic drink yesterday.  She denies fever, chills, diarrhea.  There has been no injury.    Review of Systems   ROS   As outlined in HPI    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     Intractable nausea and vomiting 06/26/2020    Obesity 12/16/2018    Pancreatitis      Past Surgical History:   Procedure Laterality Date    CESAREAN SECTION      CHOLECYSTECTOMY         Social History     Social History     Socioeconomic History    Marital status: Single     Spouse name: Not on file    Number of children: Not on file    Years of education: Not on file    Highest education level: Not on file   Occupational History    Not on file   Tobacco Use    Smoking status: Every Day     Types: Cigars    Smokeless tobacco: Not on file   Substance and Sexual Activity    Alcohol use: Yes     Alcohol/week: 21.0 standard drinks     Types: 21 Shots of liquor per week     Comment: 2-3 drinks a day, sometimes more    Drug use: Yes     Types: Marijuana Sheran Fava)    Sexual activity: Not on file   Other Topics Concern    Not on file   Social History Narrative    Not on file     Social Determinants of Health     Financial  Resource Strain: Not on file   Food Insecurity: Not on file   Transportation Needs: Not on file   Physical Activity: Not on file   Stress: Not on file   Social Connections: Not on file   Intimate Partner Violence: Not on file   Housing Stability: Not on file       Family History     Family History   Problem Relation Age of Onset    No Known Problems Mother        Current Medications     No current facility-administered medications for this encounter.     Current Outpatient Medications   Medication Sig Dispense Refill    albuterol (ACCUNEB) 0.63 MG/3ML nebulizer solution Inhale 0.63 mg into the lungs every 6 hours as needed      Naproxen Sodium 220 MG CAPS Take  220 mg by mouth as needed (Patient not taking: Reported on 04/20/2021)      ondansetron (ZOFRAN-ODT) 4 MG disintegrating tablet Take 4 mg by mouth every 8 hours as needed (Patient not taking: Reported on 04/20/2021)         Allergies     Allergies   Allergen Reactions    Hydromorphone Itching       Physical Exam     ED Triage Vitals [04/20/21 1042]   BP Temp Temp Source Heart Rate Resp SpO2 Height Weight   116/72 98.2 F (36.8 C) Oral 89 18 100 % -- --      Physical Exam  Vitals and nursing note reviewed.   Constitutional:       General: She is not in acute distress.  HENT:      Head: Normocephalic and atraumatic.   Eyes:      General: No scleral icterus.  Cardiovascular:      Rate and Rhythm: Normal rate and regular rhythm.      Heart sounds: Normal heart sounds.   Pulmonary:      Effort: Pulmonary effort is normal. No respiratory distress.      Breath sounds: Normal breath sounds.   Abdominal:      General: Bowel sounds are normal.      Palpations: Abdomen is soft.      Tenderness: There is abdominal tenderness (Tenderness in epigastrium and across upper abdomen).   Skin:     General: Skin is warm and dry.      Coloration: Skin is not jaundiced.   Neurological:      Mental Status: She is alert and oriented to person, place, and time. Mental status is at  baseline.   Psychiatric:         Behavior: Behavior normal.         Impression and Management Plan   The patient presents with abdominal pain of uncertain etiology.  Medical and surgical causes such as infectious, gastrointestinal, genitourinary, hepatobiliary, pancreatic, neoplastic, mechanical and traumatic pathologies are all considered.  Assessment including, but not limited to, labs, CT, [place], Xray is considered for evaluation.  Pain in upper abdomen.  History of pancreatitis related to alcohol use.  She is status postcholecystectomy.  Did have alcohol last night.  Will rule out acute pancreatitis.  Check abdominal labs.  Check CT abdomen pelvis.  Symptomatic management.  IV fluids.  Reassess.      Diagnostic Studies   Lab:   Results for orders placed or performed during the hospital encounter of 04/20/21   CT ABDOMEN PELVIS W IV CONTRAST Additional Contrast? None    Narrative    CT ABDOMEN PELVIS W IV CONTRAST     INDICATION:  Diffuse abdominal pain.     COMPARISON: 11/20/2020    TECHNIQUE: CT of the abdomen/pelvis was performed with intravenous contrast with  coronal and sagittal reformatted images.  Multiplanar projection reconstructions  were performed and reviewed.    All CT exams at this facility use one or more dose reduction techniques  including automatic exposure control, mA/kV adjustment per patient's size, or  iterative reconstruction technique.    FINDINGS:    LOWER THORAX: Negative     LIVER: Negative    BILIARY: Cholecystectomy. No discrete biliary ductal dilation.     SPLEEN: Negative    ADRENALS: Negative    PANCREAS: Continued increased size of pancreatic head cystic structure measuring  3.4 x 3.1 cm, previously 2.0 x 1.7 cm. This is  compatible with a pseudocyst  given patient's history. Resolved peripancreatic inflammatory changes from prior  exam. No pancreatic ductal dilation. No discrete abnormal enhancement.    RENAL: Negative.     BLADDER: Decompressed    PELVIS: Anteverted retroflexed  uterus. Hypodense endometrium, not uncommon for  patient's age. Bilateral ovarian cystic structures, grossly normal for patient's  age.    GI TRACT: Colon is decompressed with suggestion of mucosal fatty infiltration..  Normal appendix. No discrete active inflammation. No bowel obstruction.      VASCULATURE: No evidence of aneurysm.    LYMPH NODES: No abdominal or pelvic lymph nodes measuring greater than 1 cm in  short axis.    PERITONEUM: There is no free air or free fluid.     BONES: No acute osseous abnormalities.    BODY WALL: Negative      Impression    IMPRESSION:    1.  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  Peripancreatic inflammation has resolved.   2.  Otherwise, no acute findings    Electronically signed by: Linus GalasJonathan Bulzan, DO 04/20/2021 3:28 PM EDT     CBC with Diff   Result Value Ref Range    WBC 7.0 4.0 - 11.0 1000/mm3    RBC 4.48 3.60 - 5.20 M/uL    Hemoglobin 11.4 (L) 13.0 - 17.2 gm/dl    Hematocrit 16.133.4 (L) 37.0 - 50.0 %    MCV 74.6 (L) 80.0 - 98.0 fL    MCH 25.4 25.4 - 34.6 pg    MCHC 34.1 30.0 - 36.0 gm/dl    Platelets 096262 045140 - 450 1000/mm3    MPV 10.5 (H) 6.0 - 10.0 fL    RDW 44.6 36.4 - 46.3      Nucleated RBCs 0 0 - 0      Immature Granulocytes 0.1 0.0 - 3.0 %    Neutrophils Segmented 66.5 (H) 34 - 64 %    Lymphocytes 23.5 (L) 28 - 48 %    Monocytes 8.1 1 - 13 %    Eosinophils 1.7 0 - 5 %    Basophils 0.1 0 - 3 %   CMP   Result Value Ref Range    Potassium 4.1 3.5 - 5.1 mEq/L    Chloride 109 (H) 98 - 107 mEq/L    Sodium 139 136 - 145 mEq/L    CO2 23 20 - 31 mEq/L    Glucose 101 74 - 106 mg/dl    BUN <5 (L) 9 - 23 mg/dl    Creatinine 4.090.67 8.110.55 - 1.02 mg/dl    GFR African American >60.0      GFR Non-African American >60      Calcium 9.7 8.7 - 10.4 mg/dl    Anion Gap 7 5 - 15 mmol/L    AST 19.0 0.0 - 33.9 U/L    ALT 9 (L) 10 - 49 U/L    Alkaline Phosphatase 66 46 - 116 U/L    Total Bilirubin 0.50 0.30 - 1.20 mg/dl    Total Protein 8.5 (H) 5.7 - 8.2 gm/dl     Albumin 4.3 3.4 - 5.0 gm/dl   Lipase   Result Value Ref Range    Lipase 61 (H) 12 - 53 U/L   POCT Urinalysis no Micro   Result Value Ref Range    Glucose, Ur Negative NEGATIVE,Negative mg/dl    Bilirubin, Urine Small (A) NEGATIVE,Negative      Ketones, Urine Negative NEGATIVE,Negative mg/dl  Specific Gravity, Urine >=1.030 1.005 - 1.030      Blood, Urine Negative NEGATIVE,Negative      pH, Urine 5.5 5 - 9      Protein, Urine 30 (A) NEGATIVE,Negative mg/dl    Urobilinogen, Urine 0.2 0.0 - 1.0 EU/dl    Nitrite, Urine Negative NEGATIVE,Negative      Leukocyte Esterase, Urine Negative NEGATIVE,Negative      Color, UA Dark yellow      Clarity, UA Cloudy     POC Pregnancy Urine Qual   Result Value Ref Range    Pregnancy, Urine negative NEGATIVE,Negative,negative                Medications   0.9 % sodium chloride bolus (1,000 mLs IntraVENous New Bag 04/20/21 1217)   morphine injection 4 mg (4 mg IntraVENous Given 04/20/21 1220)   ondansetron (ZOFRAN) injection 4 mg (4 mg IntraVENous Given 04/20/21 1217)   iopamidol (ISOVUE-300) 61 % injection 80 mL (80 mLs IntraVENous Given 04/20/21 1513)   morphine injection 4 mg (4 mg IntraVENous Given 04/20/21 1538)       Procedures    Medical Decision Making/ED Course   Patient remains medically stable through emergency department evaluation and observation.  History of alcohol induced pancreatitis.  Had alcohol last night.  Lipase is mildly elevated.  There are no inflammatory changes involving the pancreas on CT abdomen pelvis.  Pancreatic pseudocyst is relatively stable compared with CT from March 12, 2021 where it measured 3.2 cm.  3.4 cm x 3.1 cm today.  Labs otherwise unremarkable.  Patient is in no distress.  She is recommended to abstain from alcohol completely.  Liquid diet for the next few days.  Outpatient follow-up with general surgery and gastroenterology.  Recently evaluated by [facility] surgery.  Given contact information for Dr. Waterville Riding.  Also recently evaluated by Dr. Alycia Rossetti.  She  is given his contact information as well for GI follow-up.      Medical Decision Making  Amount and/or Complexity of Data Reviewed  Labs: ordered. Decision-making details documented in ED Course.  Radiology: ordered. Decision-making details documented in ED Course.    Risk  Prescription drug management.        RECORD REVIEW:  I reviewed the patient's previous records here at [facility] and available outside facilities and note that patient was admitted in outside hospital in February 2023 with acute on chronic pancreatitis with pancreatic pseudocyst.  She was to follow-up with general surgery and gastroenterology per discharge summary.    COMORBIDITIES impacting Evaluation and Management: Alcohol abuse, pancreatitis with pseudocyst.    Threat to body function without evaluation and management: Gastrointestinal      Final Diagnosis       ICD-10-CM    1. Abdominal pain, epigastric  R10.13       2. 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 Nausea and vomiting, unspecified vomiting type  R11.2            Disposition   Discharge    Lanyah Spengler C. Kizzie Bane, MD   April 20, 2021    *Portions of this electronic record were dictated using Dragon voice recognition software.  Unintended errors in translation may occur.    My signature above authenticates this document and my orders, the final    diagnosis (es), discharge prescription (s), and instructions in the Epic    record.  If you have any questions please contact [phone].     Nursing notes have  been reviewed by the physician/ advanced practice    Clinician.               Erling Conte, MD  04/20/21 [phone]

## 2021-04-23 ENCOUNTER — Emergency Department: Admit: 2021-04-23 | Payer: PRIVATE HEALTH INSURANCE

## 2021-04-23 ENCOUNTER — Inpatient Hospital Stay
Admit: 2021-04-23 | Discharge: 2021-04-23 | Disposition: A | Discharge status: Home / Self Care | Payer: PRIVATE HEALTH INSURANCE | Attending: Emergency Medicine

## 2021-04-23 DIAGNOSIS — R1010 Upper abdominal pain, unspecified: Secondary | ICD-10-CM

## 2021-04-23 LAB — COMPREHENSIVE METABOLIC PANEL
ALT: 9 U/L — ABNORMAL LOW (ref 10–49)
AST: 22 U/L (ref 0.0–33.9)
Albumin: 3.9 gm/dl (ref 3.4–5.0)
Alkaline Phosphatase: 58 U/L (ref 46–116)
Anion Gap: 7 mmol/L (ref 5–15)
BUN: 6 mg/dl — ABNORMAL LOW (ref 9–23)
CO2: 23 mEq/L (ref 20–31)
Calcium: 9.3 mg/dl (ref 8.7–10.4)
Chloride: 109 mEq/L — ABNORMAL HIGH (ref 98–107)
Creatinine: 0.71 mg/dl (ref 0.55–1.02)
GFR African American: >60
GFR Non-African American: >60
Glucose: 97 mg/dl (ref 74–106)
Potassium: 4.1 mEq/L (ref 3.5–5.1)
Sodium: 139 mEq/L (ref 136–145)
Total Bilirubin: 0.5 mg/dl (ref 0.30–1.20)
Total Protein: 7.7 gm/dl (ref 5.7–8.2)

## 2021-04-23 LAB — CBC WITH AUTO DIFFERENTIAL
Basophils: 0.3 % (ref 0–3)
Eosinophils: 2.3 % (ref 0–5)
Hematocrit: 30.6 % — ABNORMAL LOW (ref 37.0–50.0)
Hemoglobin: 10.7 gm/dl — ABNORMAL LOW (ref 13.0–17.2)
Immature Granulocytes: 0.3 % (ref 0.0–3.0)
Lymphocytes: 26.2 % — ABNORMAL LOW (ref 28–48)
MCH: 26 pg (ref 25.4–34.6)
MCHC: 35 gm/dl (ref 30.0–36.0)
MCV: 74.5 fL — ABNORMAL LOW (ref 80.0–98.0)
MPV: 10.3 fL — ABNORMAL HIGH (ref 6.0–10.0)
Monocytes: 8.5 % (ref 1–13)
Neutrophils Segmented: 62.4 % (ref 34–64)
Nucleated RBCs: 0 (ref 0–0)
Platelets: 239 10*3/uL (ref 140–450)
RBC: 4.11 M/uL (ref 3.60–5.20)
RDW: 45.2 (ref 36.4–46.3)
WBC: 7.4 10*3/uL (ref 4.0–11.0)

## 2021-04-23 LAB — POCT URINALYSIS DIPSTICK
Bilirubin, Urine: NEGATIVE
Blood, Urine: NEGATIVE
Glucose, Ur: NEGATIVE mg/dl
Ketones, Urine: NEGATIVE mg/dl
Nitrite, Urine: NEGATIVE
Protein, Urine: NEGATIVE mg/dl
Specific Gravity, Urine: 1.02 (ref 1.005–1.030)
Urobilinogen, Urine: 0.2 EU/dl (ref 0.0–1.0)
pH, Urine: 5.5 (ref 5–9)

## 2021-04-23 LAB — MICROSCOPIC URINALYSIS

## 2021-04-23 LAB — POC PREGNANCY UR-QUAL: Pregnancy, Urine: NEGATIVE

## 2021-04-23 LAB — LIPASE: Lipase: 102 U/L — ABNORMAL HIGH (ref 12–53)

## 2021-04-23 LAB — POCT CREATININE - BLOOD: Creatinine: 0.6 mg/dl (ref 0.6–1.3)

## 2021-04-23 MED ORDER — SODIUM CHLORIDE 0.9 % IV BOLUS
0.9 % | Freq: Once | INTRAVENOUS | Status: AC
Start: 2021-04-23 — End: 2021-04-23
  Administered 2021-04-23: 16:00:00 1000 mL via INTRAVENOUS

## 2021-04-23 MED ORDER — MORPHINE SULFATE 4 MG/ML IJ SOLN
4 MG/ML | INTRAMUSCULAR | Status: AC
Start: 2021-04-23 — End: 2021-04-23
  Administered 2021-04-23: 16:00:00 4 mg via INTRAVENOUS

## 2021-04-23 MED ORDER — IOPAMIDOL 61 % IV SOLN
61 % | Freq: Once | INTRAVENOUS | Status: AC | PRN
Start: 2021-04-23 — End: 2021-04-23
  Administered 2021-04-23: 16:00:00 85 mL via INTRAVENOUS

## 2021-04-23 MED ORDER — OXYCODONE-ACETAMINOPHEN 7.5-325 MG PO TABS
ORAL_TABLET | Freq: Four times a day (QID) | ORAL | 0 refills | Status: AC | PRN
Start: 2021-04-23 — End: 2021-04-26

## 2021-04-23 MED ORDER — ONDANSETRON HCL 4 MG/2ML IJ SOLN
4 MG/2ML | Freq: Once | INTRAMUSCULAR | Status: AC
Start: 2021-04-23 — End: 2021-04-23
  Administered 2021-04-23: 14:00:00 4 mg via INTRAVENOUS

## 2021-04-23 MED ORDER — ONDANSETRON 4 MG PO TBDP
4 MG | ORAL_TABLET | Freq: Three times a day (TID) | ORAL | 0 refills | Status: DC | PRN
Start: 2021-04-23 — End: 2021-05-09

## 2021-04-23 MED ORDER — SODIUM CHLORIDE 0.9 % IV BOLUS
0.9 % | Freq: Once | INTRAVENOUS | Status: AC
Start: 2021-04-23 — End: 2021-04-23
  Administered 2021-04-23: 14:00:00 1000 mL via INTRAVENOUS

## 2021-04-23 MED ORDER — MORPHINE SULFATE 4 MG/ML IJ SOLN
4 MG/ML | INTRAMUSCULAR | Status: AC
Start: 2021-04-23 — End: 2021-04-23
  Administered 2021-04-23: 14:00:00 4 mg via INTRAVENOUS

## 2021-04-23 MED FILL — ISOVUE-300 61 % IV SOLN: 61 % | INTRAVENOUS | Qty: 80

## 2021-04-23 MED FILL — MORPHINE SULFATE 4 MG/ML IJ SOLN: 4 mg/mL | INTRAMUSCULAR | Qty: 1

## 2021-04-23 MED FILL — ONDANSETRON HCL 4 MG/2ML IJ SOLN: 4 MG/2ML | INTRAMUSCULAR | Qty: 2

## 2021-04-23 NOTE — Discharge Instructions (Signed)
Clear liquids for 48 hours.  Avoid alcohol.  Return to the ED for any new or worsening symptoms.    Results for orders placed or performed during the hospital encounter of 04/23/21   CT ABDOMEN PELVIS W IV CONTRAST Additional Contrast? None    Narrative    INDICATION: Upper abdominal pain, h/o pancreatitis and pseudocyst, concern for  acute pancreatitis    COMPARISON: None available.    TECHNIQUE:   CT of the abdomen and pelvis WITH intravenous contrast. DICOM format image data  is available to non-affiliated external healthcare facilities or entities on a  secure, media free, reciprocally searchable basis with patient authorization for  12 months following the date of the study.       All CT exams at this facility use one or more dose reduction techniques  including automatic exposure control, mA/kV adjustment per patient's size, or  iterative reconstruction technique.    WORKSTATION ID: [id number]    Findings:    Mild diffuse hepatic steatosis. Cholecystectomy. A stable indeterminate 3.2 x  3.1 cm cystic lesion within the pancreatic head. No significant peripancreatic  inflammatory changes. Spleen, adrenal glands and kidneys grossly unremarkable.    Small large bowel grossly unremarkable. A tubular structure which likely  reflects the normal appendix.    No significant lymphadenopathy.        Probable developing cyst/dominant follicle within the left ovary measuring 2.8  cm in maximal dimension.    No aggressive osseous lesions.      Impression    IMPRESSION:  1. Grossly stable cystic pancreatic head lesion, which may reflect a pseudocyst.  Continued surveillance until resolution.  2. No CT evidence of acute pancreatitis.  3. Mild hepatic steatosis.  4. Cholecystectomy.  5. Left ovarian cyst.    Electronically signed by: Mikal Plane, MD 04/23/2021 12:04 PM EDT     CBC with Auto Differential   Result Value Ref Range    WBC 7.4 4.0 - 11.0 1000/mm3    RBC 4.11 3.60 - 5.20 M/uL    Hemoglobin 10.7 (L) 13.0 - 17.2  gm/dl    Hematocrit 16.1 (L) 37.0 - 50.0 %    MCV 74.5 (L) 80.0 - 98.0 fL    MCH 26.0 25.4 - 34.6 pg    MCHC 35.0 30.0 - 36.0 gm/dl    Platelets 096 045 - 450 1000/mm3    MPV 10.3 (H) 6.0 - 10.0 fL    RDW 45.2 36.4 - 46.3      Nucleated RBCs 0 0 - 0      Immature Granulocytes 0.3 0.0 - 3.0 %    Neutrophils Segmented 62.4 34 - 64 %    Lymphocytes 26.2 (L) 28 - 48 %    Monocytes 8.5 1 - 13 %    Eosinophils 2.3 0 - 5 %    Basophils 0.3 0 - 3 %   CMP   Result Value Ref Range    Potassium 4.1 3.5 - 5.1 mEq/L    Chloride 109 (H) 98 - 107 mEq/L    Sodium 139 136 - 145 mEq/L    CO2 23 20 - 31 mEq/L    Glucose 97 74 - 106 mg/dl    BUN 6 (L) 9 - 23 mg/dl    Creatinine 4.09 8.11 - 1.02 mg/dl    GFR African American >60.0      GFR Non-African American >60      Calcium 9.3 8.7 - 10.4 mg/dl    Anion Gap  7 5 - 15 mmol/L    AST 22.0 0.0 - 33.9 U/L    ALT 9 (L) 10 - 49 U/L    Alkaline Phosphatase 58 46 - 116 U/L    Total Bilirubin 0.50 0.30 - 1.20 mg/dl    Total Protein 7.7 5.7 - 8.2 gm/dl    Albumin 3.9 3.4 - 5.0 gm/dl   Lipase   Result Value Ref Range    Lipase 102 (H) 12 - 53 U/L   Microscopic Urinalysis   Result Value Ref Range    Squam Epithel, UA 15-29 /LPF    WBC, UA OCCASIONAL /HPF    RBC, UA OCCASIONAL /HPF    BACTERIA, URINE 1+ /HPF   POCT Creatinine - BLOOD   Result Value Ref Range    Creatinine 0.6 0.6 - 1.3 mg/dl   POCT Urinalysis no Micro   Result Value Ref Range    Glucose, Ur Negative NEGATIVE,Negative mg/dl    Bilirubin, Urine Negative NEGATIVE,Negative      Ketones, Urine Negative NEGATIVE,Negative mg/dl    Specific Gravity, Urine 1.020 1.005 - 1.030      Blood, Urine Negative NEGATIVE,Negative      pH, Urine 5.5 5 - 9      Protein, Urine Negative NEGATIVE,Negative mg/dl    Urobilinogen, Urine 0.2 0.0 - 1.0 EU/dl    Nitrite, Urine Negative NEGATIVE,Negative      Leukocyte Esterase, Urine Trace (A) NEGATIVE,Negative      Color, UA Dark yellow      Clarity, UA Cloudy     POC Pregnancy Urine Qual   Result Value Ref  Range    Pregnancy, Urine negative NEGATIVE,Negative,negative

## 2021-04-23 NOTE — ED Provider Notes (Signed)
[facility]  Emergency Department Treatment Report    Patient: Rhonda Hammond Age: 31 y.o. Sex: female    Date of Birth: 19-Nov-1990 Admit Date: 04/23/2021 PCP: None None   MRN: [id number]  CSN: [id number]  Attending:  Konrad Felix, MD    Room: [id number] Time Dictated: 12:49 PM APP:  Thea Silversmith       Chief Complaint   Chief Complaint   Patient presents with    Abdominal Pain     History of Present Illness   31 y.o. female history of alcohol induced pancreatitis presents to the ED for continued upper abdominal pain that she says was present when she was here 3 days ago.  She says that she has not had any alcohol to drink since she was here and the pain never went away.  She endorses nausea and vomiting but denies fever, chills, chest pain, trouble breathing, urinary trouble, diarrhea or constipation.  Of note patient says she had cholecystectomy January 2023.    Review of Systems   Constitutional:  No fever or chills  Eyes: No eye redness or drainage.  ENT: No sore throat, runny nose.  Respiratory: No cough, dyspnea or wheezing.  Cardiovascular: No chest pain, pressure, palpitations, tightness or heaviness.  Gastrointestinal: See HPI  Genitourinary: No dysuria, frequency, or urgency.  Musculoskeletal: No joint pain or swelling.  Integumentary: No rashes.  Neurological: No headaches, focal sensory or motor symptoms.  Denies complaints in all other systems.    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     Intractable nausea and vomiting 06/26/2020    Obesity 12/16/2018    Pancreatitis      Past Surgical History:   Procedure Laterality Date    CESAREAN SECTION      CHOLECYSTECTOMY             Social History     Social History     Socioeconomic History    Marital status: Single   Tobacco Use    Smoking status: Every Day      Types: Cigars   Substance and Sexual Activity    Alcohol use: Yes     Alcohol/week: 21.0 standard drinks     Types: 21 Shots of liquor per week     Comment: 2-3 drinks a day, sometimes more    Drug use: Yes     Types: Marijuana Sheran Fava)       Family History     Family History   Problem Relation Age of Onset    No Known Problems Mother        Home Medications     Previous Medications    ALBUTEROL (ACCUNEB) 0.63 MG/3ML NEBULIZER SOLUTION    Inhale 0.63 mg into the lungs every 6 hours as needed    NAPROXEN SODIUM 220 MG CAPS    Take 220 mg by mouth as needed    ONDANSETRON (ZOFRAN-ODT) 4 MG DISINTEGRATING TABLET    Take 4 mg by mouth every 8 hours as needed       Allergies     Allergies   Allergen Reactions    Hydromorphone Itching       Physical Exam     Vitals:    04/23/21 0900   BP: 131/85  Pulse: 89   Resp: 18   SpO2: 100%      Constitutional: Patient appears well developed and well nourished. Appearance and behavior are age and situation appropriate.  HEENT: Conjunctiva clear.  PERRLA. Mucous membranes moist, non-erythematous.   Neck: Symmetrical, no masses. Normal range of motion.  Respiratory: Lungs clear to auscultation, nonlabored respirations. No tachypnea or accessory muscle use.  Cardiovascular: Heart regular rate and rhythm without murmur rubs or gallops.     Gastrointestinal:  Abdomen soft, tender to palpation upper abdomen.  No masses, rebound or guarding.  Musculoskeletal: No deformities of the limbs.  Integumentary: Warm and dry without rashes or lesions  Neurologic: Alert and moving all limbs spontaneously.    Type(s) of Personal Protection Equipment (PPE) worn during exam: Surgical mask, gloves  Impression and Management Plan   31 year old female with history of alcohol induced pancreatitis here with upper abdominal pain.  Labs ordered, will consider CT abdomen pelvis.  Will medicate and hydrate.  Will keep NPO.    Differential diagnoses: Pancreatitis, gastritis, retained gallstone, bowel  obstruction, viral illness, colitis, diverticulitis, appendicitis.  Diagnostic Studies       CT abdomen pelvis with IV contrast shows stable pseudocyst pancreatic head as interpreted by me.    Imaging and Labs:  Results for orders placed or performed during the hospital encounter of 04/23/21   CT ABDOMEN PELVIS W IV CONTRAST Additional Contrast? None    Narrative    INDICATION: Upper abdominal pain, h/o pancreatitis and pseudocyst, concern for  acute pancreatitis    COMPARISON: None available.    TECHNIQUE:   CT of the abdomen and pelvis WITH intravenous contrast. DICOM format image data  is available to non-affiliated external healthcare facilities or entities on a  secure, media free, reciprocally searchable basis with patient authorization for  12 months following the date of the study.       All CT exams at this facility use one or more dose reduction techniques  including automatic exposure control, mA/kV adjustment per patient's size, or  iterative reconstruction technique.    WORKSTATION ID: [id number]    Findings:    Mild diffuse hepatic steatosis. Cholecystectomy. A stable indeterminate 3.2 x  3.1 cm cystic lesion within the pancreatic head. No significant peripancreatic  inflammatory changes. Spleen, adrenal glands and kidneys grossly unremarkable.    Small large bowel grossly unremarkable. A tubular structure which likely  reflects the normal appendix.    No significant lymphadenopathy.        Probable developing cyst/dominant follicle within the left ovary measuring 2.8  cm in maximal dimension.    No aggressive osseous lesions.      Impression    IMPRESSION:  1. Grossly stable cystic pancreatic head lesion, which may reflect a pseudocyst.  Continued surveillance until resolution.  2. No CT evidence of acute pancreatitis.  3. Mild hepatic steatosis.  4. Cholecystectomy.  5. Left ovarian cyst.    Electronically signed by: Mikal Plane, MD 04/23/2021 12:04 PM EDT     CBC with Auto Differential   Result  Value Ref Range    WBC 7.4 4.0 - 11.0 1000/mm3    RBC 4.11 3.60 - 5.20 M/uL    Hemoglobin 10.7 (L) 13.0 - 17.2 gm/dl    Hematocrit 97.6 (L) 37.0 - 50.0 %    MCV 74.5 (L) 80.0 - 98.0 fL    MCH 26.0 25.4 - 34.6 pg    MCHC 35.0 30.0 - 36.0 gm/dl    Platelets 734  140 - 450 1000/mm3    MPV 10.3 (H) 6.0 - 10.0 fL    RDW 45.2 36.4 - 46.3      Nucleated RBCs 0 0 - 0      Immature Granulocytes 0.3 0.0 - 3.0 %    Neutrophils Segmented 62.4 34 - 64 %    Lymphocytes 26.2 (L) 28 - 48 %    Monocytes 8.5 1 - 13 %    Eosinophils 2.3 0 - 5 %    Basophils 0.3 0 - 3 %   CMP   Result Value Ref Range    Potassium 4.1 3.5 - 5.1 mEq/L    Chloride 109 (H) 98 - 107 mEq/L    Sodium 139 136 - 145 mEq/L    CO2 23 20 - 31 mEq/L    Glucose 97 74 - 106 mg/dl    BUN 6 (L) 9 - 23 mg/dl    Creatinine 6.96 2.95 - 1.02 mg/dl    GFR African American >60.0      GFR Non-African American >60      Calcium 9.3 8.7 - 10.4 mg/dl    Anion Gap 7 5 - 15 mmol/L    AST 22.0 0.0 - 33.9 U/L    ALT 9 (L) 10 - 49 U/L    Alkaline Phosphatase 58 46 - 116 U/L    Total Bilirubin 0.50 0.30 - 1.20 mg/dl    Total Protein 7.7 5.7 - 8.2 gm/dl    Albumin 3.9 3.4 - 5.0 gm/dl   Lipase   Result Value Ref Range    Lipase 102 (H) 12 - 53 U/L   Microscopic Urinalysis   Result Value Ref Range    Squam Epithel, UA 15-29 /LPF    WBC, UA OCCASIONAL /HPF    RBC, UA OCCASIONAL /HPF    BACTERIA, URINE 1+ /HPF   POCT Creatinine - BLOOD   Result Value Ref Range    Creatinine 0.6 0.6 - 1.3 mg/dl   POCT Urinalysis no Micro   Result Value Ref Range    Glucose, Ur Negative NEGATIVE,Negative mg/dl    Bilirubin, Urine Negative NEGATIVE,Negative      Ketones, Urine Negative NEGATIVE,Negative mg/dl    Specific Gravity, Urine 1.020 1.005 - 1.030      Blood, Urine Negative NEGATIVE,Negative      pH, Urine 5.5 5 - 9      Protein, Urine Negative NEGATIVE,Negative mg/dl    Urobilinogen, Urine 0.2 0.0 - 1.0 EU/dl    Nitrite, Urine Negative NEGATIVE,Negative      Leukocyte Esterase, Urine Trace (A)  NEGATIVE,Negative      Color, UA Dark yellow      Clarity, UA Cloudy     POC Pregnancy Urine Qual   Result Value Ref Range    Pregnancy, Urine negative NEGATIVE,Negative,negative              Lab:   Recent Results (from the past 24 hour(s))   CBC with Auto Differential    Collection Time: 04/23/21  9:54 AM   Result Value Ref Range    WBC 7.4 4.0 - 11.0 1000/mm3    RBC 4.11 3.60 - 5.20 M/uL    Hemoglobin 10.7 (L) 13.0 - 17.2 gm/dl    Hematocrit 28.4 (L) 37.0 - 50.0 %    MCV 74.5 (L) 80.0 - 98.0 fL    MCH 26.0 25.4 - 34.6 pg    MCHC 35.0 30.0 - 36.0 gm/dl    Platelets 132 440 -  450 1000/mm3    MPV 10.3 (H) 6.0 - 10.0 fL    RDW 45.2 36.4 - 46.3      Nucleated RBCs 0 0 - 0      Immature Granulocytes 0.3 0.0 - 3.0 %    Neutrophils Segmented 62.4 34 - 64 %    Lymphocytes 26.2 (L) 28 - 48 %    Monocytes 8.5 1 - 13 %    Eosinophils 2.3 0 - 5 %    Basophils 0.3 0 - 3 %   CMP    Collection Time: 04/23/21  9:54 AM   Result Value Ref Range    Potassium 4.1 3.5 - 5.1 mEq/L    Chloride 109 (H) 98 - 107 mEq/L    Sodium 139 136 - 145 mEq/L    CO2 23 20 - 31 mEq/L    Glucose 97 74 - 106 mg/dl    BUN 6 (L) 9 - 23 mg/dl    Creatinine 9.60 4.54 - 1.02 mg/dl    GFR African American >60.0      GFR Non-African American >60      Calcium 9.3 8.7 - 10.4 mg/dl    Anion Gap 7 5 - 15 mmol/L    AST 22.0 0.0 - 33.9 U/L    ALT 9 (L) 10 - 49 U/L    Alkaline Phosphatase 58 46 - 116 U/L    Total Bilirubin 0.50 0.30 - 1.20 mg/dl    Total Protein 7.7 5.7 - 8.2 gm/dl    Albumin 3.9 3.4 - 5.0 gm/dl   Lipase    Collection Time: 04/23/21  9:54 AM   Result Value Ref Range    Lipase 102 (H) 12 - 53 U/L   POCT Creatinine - BLOOD    Collection Time: 04/23/21  9:59 AM   Result Value Ref Range    Creatinine 0.6 0.6 - 1.3 mg/dl   POCT Urinalysis no Micro    Collection Time: 04/23/21 11:14 AM   Result Value Ref Range    Glucose, Ur Negative NEGATIVE,Negative mg/dl    Bilirubin, Urine Negative NEGATIVE,Negative      Ketones, Urine Negative NEGATIVE,Negative mg/dl     Specific Gravity, Urine 1.020 1.005 - 1.030      Blood, Urine Negative NEGATIVE,Negative      pH, Urine 5.5 5 - 9      Protein, Urine Negative NEGATIVE,Negative mg/dl    Urobilinogen, Urine 0.2 0.0 - 1.0 EU/dl    Nitrite, Urine Negative NEGATIVE,Negative      Leukocyte Esterase, Urine Trace (A) NEGATIVE,Negative      Color, UA Dark yellow      Clarity, UA Cloudy     Microscopic Urinalysis    Collection Time: 04/23/21 11:14 AM   Result Value Ref Range    Squam Epithel, UA 15-29 /LPF    WBC, UA OCCASIONAL /HPF    RBC, UA OCCASIONAL /HPF    BACTERIA, URINE 1+ /HPF   POC Pregnancy Urine Qual    Collection Time: 04/23/21 11:17 AM   Result Value Ref Range    Pregnancy, Urine negative NEGATIVE,Negative,negative         Other studies: My interpretation of other studies is that they show, among other things, no leukocytosis, white blood cell count 7.4.  ED Course            Patient received the following medications during stay:  Medications   0.9 % sodium chloride bolus (1,000 mLs IntraVENous New Bag 04/23/21 1154)  0.9 % sodium chloride bolus (1,000 mLs IntraVENous New Bag 04/23/21 0958)   morphine injection 4 mg (4 mg IntraVENous Given 04/23/21 1002)   ondansetron (ZOFRAN) injection 4 mg (4 mg IntraVENous Given 04/23/21 1002)   iopamidol (ISOVUE-300) 61 % injection 80 mL (85 mLs IntraVENous Given 04/23/21 1152)   morphine injection 4 mg (4 mg IntraVENous Given 04/23/21 1218)         Most recent vital signs:  BP 131/85   Pulse 89   Resp 18   LMP 04/01/2021   SpO2 100%       RECORDS REVIEWED: I reviewed the patient's previous records here at [facility] and available outside facilities and note that patient seen at numerous facilities over the last 4 months for abdominal pain.    EXTERNAL RESULTS REVIEWED: Lipase 04/20/2021 was 61, CT abdomen and pelvis also done on the same day showed continued increasing size of pancreatic head pseudocyst since prior exam.  Peripancreatic inflammation resolved.  No acute findings.    Severe  exacerbation or progression of chronic illness: Alcohol abuse, pancreatitis    Threat to body function without evaluation and management: Intestinal, vascular, metabolic    SOCIAL DETERMINANTS impacting Evaluation and Management: Provider availability and quality of care      Medical Decision Making     NARRATIVE: CBC CMP stable without elevated bilirubin, LFTs.  Lipase increased from 61 3 days ago, up to 102 today.  CT abdomen pelvis shows no signs of acute pancreatitis.  Shows stable pseudocyst.    Patient safe for discharge, recommend clear liquids for 48 hours, will treat pain and nausea as prescribed below.  Patient says she has not had any alcohol to drink in the last 3 days and she maintains commitment to not drinking alcohol further.  Patient feels comfortable going home.    Patient will be discharged with the following medication prescriptions:     Medication List        START taking these medications      oxyCODONE-acetaminophen 7.5-325 MG per tablet  Commonly known as: Percocet  Take 1 tablet by mouth every 6 hours as needed for Pain for up to 3 days. Intended supply: 3 days Max Daily Amount: 4 tablets            CHANGE how you take these medications      * ondansetron 4 MG disintegrating tablet  Commonly known as: ZOFRAN-ODT  What changed: Another medication with the same name was added. Make sure you understand how and when to take each.     * ondansetron 4 MG disintegrating tablet  Commonly known as: ZOFRAN-ODT  Take 1 tablet by mouth 3 times daily as needed for Nausea or Vomiting  What changed: You were already taking a medication with the same name, and this prescription was added. Make sure you understand how and when to take each.           * This list has 2 medication(s) that are the same as other medications prescribed for you. Read the directions carefully, and ask your doctor or other care provider to review them with you.                ASK your doctor about these medications      albuterol  0.63 MG/3ML nebulizer solution  Commonly known as: ACCUNEB     Naproxen Sodium 220 MG Caps               Where to  Get Your Medications        These medications were sent to [id number] [place] [place], [place] - [address] - P [phone] Carmon Ginsberg- F [phone]  [address], [place] [id number]      Phone: [phone]   ondansetron 4 MG disintegrating tablet  oxyCODONE-acetaminophen 7.5-325 MG per tablet         Patient instructed to return to the ED for any new or worsening symptoms, otherwise, they are to follow up with primary care physician.    Final Diagnosis     1. Upper abdominal pain    2. Pancreatic pseudocyst    3. Elevated lipase    4. Intractable nausea and vomiting    5. Dehydration    6. Chronic alcoholic pancreatitis (HCC)        Disposition   The patient was discharged in stable condition will follow-up as outlined above.      The patient was personally evaluated by myself and discussed with Konrad FelixLewis H Siegel, MD   who agrees with the above assessment and plan.      Elray BubaSarah E. Satara Virella, PA-C  April 23, 2021    My signature above authenticates this document and my orders, the final    diagnosis (es), discharge prescription (s), and instructions in the Epic    record.  If you have any questions please contact [phone].     Nursing notes have been reviewed by the physician/ advanced practice    Clinician.         Elray BubaSarah E Thamas Appleyard, PA-C  04/23/21 1252

## 2021-04-23 NOTE — ED Triage Notes (Signed)
Pt comes by ems -complains of abd pain. Pt ambulatory into ER. Seen recently for the same.

## 2021-04-23 NOTE — ED Notes (Signed)
Patient taken to the waiting room, medicaid cab called for the patient     Gibson Ramp, RN  04/23/21 1259

## 2021-04-23 NOTE — ED Notes (Signed)
Pt escorted back to ER45. Pt states she was just here for the same issue and had her gallbladder removed in January and has had problems with N/V ever since.    Pt sitting in chair. Call light in reach. No needs at this time.       Volney Presser, RN  04/23/21 0930

## 2021-04-24 LAB — CULTURE, URINE: Culture Result: 80000 — AB

## 2021-05-07 ENCOUNTER — Inpatient Hospital Stay
Admit: 2021-05-07 | Discharge: 2021-05-07 | Disposition: A | Discharge status: Home / Self Care | Payer: PRIVATE HEALTH INSURANCE | Attending: Emergency Medicine

## 2021-05-07 DIAGNOSIS — R1013 Epigastric pain: Secondary | ICD-10-CM

## 2021-05-07 LAB — POCT URINALYSIS DIPSTICK
Bilirubin, Urine: NEGATIVE
Glucose, Ur: NEGATIVE mg/dl
Ketones, Urine: NEGATIVE mg/dl
Nitrite, Urine: NEGATIVE
Protein, Urine: NEGATIVE mg/dl
Specific Gravity, Urine: <=1.005 (ref 1.005–1.030)
Urobilinogen, Urine: 0.2 EU/dl (ref 0.0–1.0)
pH, Urine: 6 (ref 5–9)

## 2021-05-07 LAB — COMPREHENSIVE METABOLIC PANEL
ALT: <7 U/L — ABNORMAL LOW (ref 10–49)
AST: 11 U/L (ref 0.0–33.9)
Albumin: 3.6 gm/dl (ref 3.4–5.0)
Alkaline Phosphatase: 50 U/L (ref 46–116)
Anion Gap: 7 mmol/L (ref 5–15)
BUN: 6 mg/dl — ABNORMAL LOW (ref 9–23)
CO2: 25 mEq/L (ref 20–31)
Calcium: 9 mg/dl (ref 8.7–10.4)
Chloride: 109 mEq/L — ABNORMAL HIGH (ref 98–107)
Creatinine: 0.64 mg/dl (ref 0.55–1.02)
GFR African American: >60
GFR Non-African American: >60
Glucose: 98 mg/dl (ref 74–106)
Potassium: 3.6 mEq/L (ref 3.5–5.1)
Sodium: 141 mEq/L (ref 136–145)
Total Bilirubin: 0.3 mg/dl (ref 0.30–1.20)
Total Protein: 7.1 gm/dl (ref 5.7–8.2)

## 2021-05-07 LAB — MICROSCOPIC URINALYSIS

## 2021-05-07 LAB — CBC WITH AUTO DIFFERENTIAL
Basophils: 0.3 % (ref 0–3)
Eosinophils: 2.3 % (ref 0–5)
Hematocrit: 27.4 % — ABNORMAL LOW (ref 37.0–50.0)
Hemoglobin: 9.4 gm/dl — ABNORMAL LOW (ref 13.0–17.2)
Immature Granulocytes: 0.3 % (ref 0.0–3.0)
Lymphocytes: 22.5 % — ABNORMAL LOW (ref 28–48)
MCH: 25.7 pg (ref 25.4–34.6)
MCHC: 34.3 gm/dl (ref 30.0–36.0)
MCV: 74.9 fL — ABNORMAL LOW (ref 80.0–98.0)
MPV: 10.4 fL — ABNORMAL HIGH (ref 6.0–10.0)
Monocytes: 7.3 % (ref 1–13)
Neutrophils Segmented: 67.3 % — ABNORMAL HIGH (ref 34–64)
Nucleated RBCs: 0 (ref 0–0)
Platelets: 196 10*3/uL (ref 140–450)
RBC: 3.66 M/uL (ref 3.60–5.20)
RDW: 43.9 (ref 36.4–46.3)
WBC: 8 10*3/uL (ref 4.0–11.0)

## 2021-05-07 LAB — LIPASE: Lipase: 36 U/L (ref 12–53)

## 2021-05-07 LAB — POC PREGNANCY UR-QUAL: Pregnancy, Urine: NEGATIVE

## 2021-05-07 MED ORDER — LIDOCAINE VISCOUS HCL 2 % MT SOLN
2 % | OROMUCOSAL | Status: AC
Start: 2021-05-07 — End: 2021-05-07
  Administered 2021-05-07: 08:00:00 15 mL via OROMUCOSAL

## 2021-05-07 MED ORDER — OXYCODONE HCL 5 MG PO TABS
5 MG | ORAL | Status: AC
Start: 2021-05-07 — End: 2021-05-07
  Administered 2021-05-07: 07:00:00 5 mg via ORAL

## 2021-05-07 MED ORDER — ALUM & MAG HYDROXIDE-SIMETH 200-200-20 MG/5ML PO SUSP
200-200-20 MG/5ML | ORAL | Status: AC
Start: 2021-05-07 — End: 2021-05-07
  Administered 2021-05-07: 08:00:00 30 mL via ORAL

## 2021-05-07 MED ORDER — SODIUM CHLORIDE 0.9 % IV BOLUS
0.9 % | Freq: Once | INTRAVENOUS | Status: AC
Start: 2021-05-07 — End: 2021-05-07
  Administered 2021-05-07: 07:00:00 1000 mL via INTRAVENOUS

## 2021-05-07 MED ORDER — ONDANSETRON HCL 4 MG/2ML IJ SOLN
4 MG/2ML | Freq: Once | INTRAMUSCULAR | Status: AC
Start: 2021-05-07 — End: 2021-05-07
  Administered 2021-05-07: 07:00:00 4 mg via INTRAVENOUS

## 2021-05-07 MED ORDER — PANTOPRAZOLE SODIUM 40 MG PO TBEC
40 MG | ORAL_TABLET | Freq: Every day | ORAL | 0 refills | Status: DC
Start: 2021-05-07 — End: 2021-05-09

## 2021-05-07 MED ORDER — SODIUM CHLORIDE (PF) 0.9 % IJ SOLN
0.9 % | INTRAMUSCULAR | Status: AC
Start: 2021-05-07 — End: 2021-05-07
  Administered 2021-05-07: 07:00:00 20 mg via INTRAVENOUS

## 2021-05-07 MED FILL — OXYCODONE HCL 5 MG PO TABS: 5 MG | ORAL | Qty: 1

## 2021-05-07 MED FILL — LIDOCAINE VISCOUS HCL 2 % MT SOLN: 2 % | OROMUCOSAL | Qty: 15

## 2021-05-07 MED FILL — FAMOTIDINE (PF) 20 MG/2ML IV SOLN: 20 MG/2ML | INTRAVENOUS | Qty: 2

## 2021-05-07 MED FILL — SODIUM CHLORIDE 0.9 % IV SOLN: 0.9 % | INTRAVENOUS | Qty: 1000

## 2021-05-07 MED FILL — ALUM & MAG HYDROXIDE-SIMETH 200-200-20 MG/5ML PO SUSP: 200-200-20 MG/5ML | ORAL | Qty: 30

## 2021-05-07 MED FILL — ONDANSETRON HCL 4 MG/2ML IJ SOLN: 4 MG/2ML | INTRAMUSCULAR | Qty: 2

## 2021-05-07 NOTE — ED Notes (Signed)
Dc and fu instructions given  Pt verbalizes understanding      Tresa Garter, RN  05/07/21 [phone]

## 2021-05-07 NOTE — ED Notes (Signed)
Assumed care of pt  Tearful, sitting on edge of bed with legs dangling  C/o nausea with abdominal pain  IVF up for bolus, medicated for pain and nausea, site s/p  NP Clark at bedside to talk to pt about dropping H/H, pt denied overt bloody stool or dark stools;        Tresa Garter, RN  05/07/21 [phone]

## 2021-05-07 NOTE — ED Notes (Signed)
Pt resting comfortably  IVF bolus complete  No longer tearful  VSS  States pain Is better 4/10     Tresa Garter, RN  05/07/21 [phone]

## 2021-05-07 NOTE — ED Notes (Signed)
Urine dipped and preg.  Urine sent to lab     [place], [place]  05/07/21 [phone]

## 2021-05-07 NOTE — ED Provider Notes (Signed)
[facility] Care  Emergency Department Treatment Report        Patient: Rhonda Hammond Age: 31 y.o. Sex: female    Date of Birth: 08-30-1990 Admit Date: 05/07/2021 PCP: None None   MRN: [id number]  CSN: [id number]  Attending Despina Hick, MD    Room: 112/EO12 Time Dictated: 2:45 AM APP: Willette Brace, FNP-C            Chief Complaint   Chief Complaint   Patient presents with    Abdominal Pain       History of Present Illness   This is a 31 y.o. female with a history of asthma, pancreatitis, cholecystectomy who presents to the ER with complaints of upper abdominal pain.  Patient reports that she woke up this morning having minimal pain to her Upper abdominal area.  She states abdominal pain improved after she had taken her regular medications.  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   She states that she does have episodes of vomiting, upper abdominal pain returned, prompting her to come to the ER.   Patient states that she smokes Black and milds, marijuana, states has not had any alcohol x1 month.   Patient states that she last ate home made spaghetti at 1 PM.  She denies any chest pain, shortness of breath, fevers, falls, injury, dysuria, hematuria  She denies any dark tarry stools, or blood in her vomit  She states her menses completed yesterday  Patient states that her first appointment with gastroenterology will be on 05/16/2021.   Review of Systems   Review of Systems   Constitutional:  Negative for activity change and fever.   Respiratory:  Negative for chest tightness and shortness of breath.    Cardiovascular:  Negative for chest pain.   Gastrointestinal:  Positive for abdominal pain, nausea and vomiting. Negative for diarrhea.   Genitourinary:  Negative for dysuria and hematuria.   Musculoskeletal:  Negative for back pain and neck pain.   Neurological:  Negative for weakness and numbness.    All other systems reviewed and are negative.      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     Intractable nausea and vomiting 06/26/2020    Obesity 12/16/2018    Pancreatitis      Past Surgical History:   Procedure Laterality Date    CESAREAN SECTION      CHOLECYSTECTOMY         Social History     Social History     Socioeconomic History    Marital status: Single     Spouse name: Not on file    Number of children: Not on file    Years of education: Not on file    Highest education level: Not on file   Occupational History    Not on file   Tobacco Use    Smoking status: Every Day     Types: Cigars    Smokeless tobacco: Not on file   Substance and Sexual Activity    Alcohol use: Yes     Alcohol/week: 21.0 standard drinks     Types: 21 Shots of liquor  per week     Comment: 2-3 drinks a day, sometimes more    Drug use: Yes     Types: Marijuana Sheran Fava)    Sexual activity: Not on file   Other Topics Concern    Not on file   Social History Narrative    Not on file     Social Determinants of Health     Financial Resource Strain: Not on file   Food Insecurity: Not on file   Transportation Needs: Not on file   Physical Activity: Not on file   Stress: Not on file   Social Connections: Not on file   Intimate Partner Violence: Not on file   Housing Stability: Not on file       Family History     Family History   Problem Relation Age of Onset    No Known Problems Mother         Current Medications     Current Facility-Administered Medications   Medication Dose Route Frequency Provider Last Rate Last Admin    0.9 % sodium chloride bolus  1,000 mL IntraVENous Once Apolonio Schneiders, APRN - NP 495.9 mL/hr at 05/07/21 0237 1,000 mL at 05/07/21 0237     Current Outpatient Medications   Medication Sig Dispense Refill    ondansetron (ZOFRAN-ODT) 4 MG  disintegrating tablet Take 1 tablet by mouth 3 times daily as needed for Nausea or Vomiting 21 tablet 0    albuterol (ACCUNEB) 0.63 MG/3ML nebulizer solution Inhale 0.63 mg into the lungs every 6 hours as needed      Naproxen Sodium 220 MG CAPS Take 220 mg by mouth as needed (Patient not taking: Reported on 04/20/2021)      ondansetron (ZOFRAN-ODT) 4 MG disintegrating tablet Take 4 mg by mouth every 8 hours as needed (Patient not taking: Reported on 04/20/2021)         Allergies   alg  Allergies   Allergen Reactions    Hydromorphone Itching       Physical Exam   Patient Vitals for the past 24 hrs:   Temp Pulse Resp BP SpO2   05/07/21 0148 98.4 F (36.9 C) -- -- -- --   05/07/21 0143 98.4 F (36.9 C) (!) 105 16 (!) 156/75 98 %     Physical Exam  Constitutional:       Appearance: Normal appearance.   HENT:      Head: Normocephalic and atraumatic.   Cardiovascular:      Rate and Rhythm: Normal rate and regular rhythm.   Pulmonary:      Effort: Pulmonary effort is normal.      Breath sounds: Normal breath sounds.   Abdominal:      General: Abdomen is flat.      Tenderness: There is abdominal tenderness in the epigastric area.   Musculoskeletal:         General: Normal range of motion.      Cervical back: Normal range of motion and neck supple.   Skin:     General: Skin is warm and dry.      Capillary Refill: Capillary refill takes less than 2 seconds.   Neurological:      Mental Status: She is alert.         Impression and Management Plan   Patient is a 31 year old female who presents to the ER with epigastric pain.     Upper abd pain DDx includes but is not limited to the following: PUD,  gastritis, gastroesophageal reflux, acute cholecystitis, biliary colic, pancreatitis, hepatitis, transverse colitis, atypical appendicitis           Diagnostic Studies   Lab:   Results for orders placed or performed during the hospital encounter of 05/07/21   CBC with Diff   Result Value Ref Range    WBC 8.0 4.0 - 11.0 1000/mm3    RBC  3.66 3.60 - 5.20 M/uL    Hemoglobin 9.4 (L) 13.0 - 17.2 gm/dl    Hematocrit 16.127.4 (L) 37.0 - 50.0 %    MCV 74.9 (L) 80.0 - 98.0 fL    MCH 25.7 25.4 - 34.6 pg    MCHC 34.3 30.0 - 36.0 gm/dl    Platelets 096196 045140 - 450 1000/mm3    MPV 10.4 (H) 6.0 - 10.0 fL    RDW 43.9 36.4 - 46.3      Nucleated RBCs 0 0 - 0      Immature Granulocytes 0.3 0.0 - 3.0 %    Neutrophils Segmented 67.3 (H) 34 - 64 %    Lymphocytes 22.5 (L) 28 - 48 %    Monocytes 7.3 1 - 13 %    Eosinophils 2.3 0 - 5 %    Basophils 0.3 0 - 3 %   POCT Urinalysis no Micro   Result Value Ref Range    Glucose, Ur Negative NEGATIVE,Negative mg/dl    Bilirubin, Urine Negative NEGATIVE,Negative      Ketones, Urine Negative NEGATIVE,Negative mg/dl    Specific Gravity, Urine <=1.005 1.005 - 1.030      Blood, Urine Trace-lysed (A) NEGATIVE,Negative      pH, Urine 6.0 5 - 9      Protein, Urine Negative NEGATIVE,Negative mg/dl    Urobilinogen, Urine 0.2 0.0 - 1.0 EU/dl    Nitrite, Urine Negative NEGATIVE,Negative      Leukocyte Esterase, Urine Trace (A) NEGATIVE,Negative      Color, UA Light yellow      Clarity, UA Clear     POC Pregnancy Urine Qual   Result Value Ref Range    Pregnancy, Urine negative NEGATIVE,Negative,negative           Imaging:    No results found.           Other studies:  My interpretation of other studies is that they show, among other things, labs without leukocytosis, H&H 9.4 and 27.4  Urinalysis with trace blood, and leukocytes, negative pregnancy      Procedures    ED Course / Medical Decision Making        Patient presents with upper abdominal pain.   The patient has a relatively benign exam with no signs of surgical pathology.    Labs are in process       Pt has been turned over to Dr Sherian MaroonSharkey   Patients disposition will be determined based on the results of her diagnostic studies in her continued clinical progress.    Please see notes by Dr Sherian MaroonSharkey                 RECORDS REVIEWED:  I reviewed the patient's [facility] and  available outside facilities and note that patient last seen here on 04/23/2021 with complaints of recurring abdominal pain.  CT abdomen pelvis with grossly stable cystic pancreatic head lesion which may reflect pseudocyst.  No CT evidence of acute pancreatitis  Labs on 04/23/2020, with lipase elevated at 102, no electrolyte abnormality, no leukocytosis  EXTERNAL RESULTS REVIEWED:    Patient seen at [facility] on 05/02/2021 with complaints of Abd pain, workup with labs with LFTs that are not elevated, lipase 29, urinalysis was positive for trichomonas  patient was treated symptomatically with roxicodone for her pancreatitis flareup was found stable for discharge  Lipase elevated on 11/19/2020 at 1,248          INDEPENDENT HISTORIAN:  History and/or plan development assisted by: patient     Severe exacerbation or progression of chronic illness: abd pain      Threat to body function without evaluation and management:  pancreas       SOCIAL DETERMINANTS  impacting Evaluation and Management: Personal health habits, health services          Medications   0.9 % sodium chloride bolus (1,000 mLs IntraVENous New Bag 05/07/21 0237)   ondansetron (ZOFRAN) injection 4 mg (4 mg IntraVENous Given 05/07/21 0242)   famotidine (PEPCID) 20 mg in sodium chloride (PF) 0.9 % 10 mL injection (20 mg IntraVENous Given 05/07/21 0242)   oxyCODONE (ROXICODONE) immediate release tablet 5 mg (5 mg Oral Given 05/07/21 0242)           Final Diagnosis       ICD-10-CM    1. Pain of upper abdomen  R10.10                Disposition       Pending     Ernst Spell, NP   May 07, 2021            The patient was personally evaluated by myself and evaluated by Despina Hick, MD who agrees with the above assessment and plan.    My signature above authenticates this document and my orders, the final    diagnosis (es), discharge prescription (s), and instructions in the Epic    record.  If you have any questions please contact  [phone].     Nursing notes have been reviewed by the physician/ advanced practice    Clinician.                             Apolonio Schneiders, APRN - NP  05/07/21 [phone]

## 2021-05-07 NOTE — ED Triage Notes (Signed)
Pt arrived via EMS from home. Pt c/o abdominal pain. History of pancreatitic issues. 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

## 2021-05-08 ENCOUNTER — Inpatient Hospital Stay
Admit: 2021-05-08 | Discharge: 2021-05-08 | Disposition: A | Discharge status: Home / Self Care | Payer: PRIVATE HEALTH INSURANCE | Attending: Emergency Medicine

## 2021-05-08 DIAGNOSIS — R1013 Epigastric pain: Secondary | ICD-10-CM

## 2021-05-08 LAB — COMPREHENSIVE METABOLIC PANEL
ALT: <7 U/L — ABNORMAL LOW (ref 10–49)
AST: 14 U/L (ref 0.0–33.9)
Albumin: 3.9 gm/dl (ref 3.4–5.0)
Alkaline Phosphatase: 53 U/L (ref 46–116)
Anion Gap: 7 mmol/L (ref 5–15)
BUN: <5 mg/dl — ABNORMAL LOW (ref 9–23)
CO2: 24 mEq/L (ref 20–31)
Calcium: 9 mg/dl (ref 8.7–10.4)
Chloride: 108 mEq/L — ABNORMAL HIGH (ref 98–107)
Creatinine: 0.55 mg/dl (ref 0.55–1.02)
GFR African American: >60
GFR Non-African American: >60
Glucose: 90 mg/dl (ref 74–106)
Potassium: 3.7 mEq/L (ref 3.5–5.1)
Sodium: 139 mEq/L (ref 136–145)
Total Bilirubin: 0.4 mg/dl (ref 0.30–1.20)
Total Protein: 7.4 gm/dl (ref 5.7–8.2)

## 2021-05-08 LAB — CBC WITH AUTO DIFFERENTIAL
Basophils: 0 % (ref 0–3)
Eosinophils: 0 % (ref 0–5)
Hematocrit: 29 % — ABNORMAL LOW (ref 37.0–50.0)
Hemoglobin: 9.9 gm/dl — ABNORMAL LOW (ref 13.0–17.2)
Immature Granulocytes: 0.1 % (ref 0.0–3.0)
Lymphocytes: 12 % — ABNORMAL LOW (ref 28–48)
MCH: 25.4 pg (ref 25.4–34.6)
MCHC: 34.1 gm/dl (ref 30.0–36.0)
MCV: 74.4 fL — ABNORMAL LOW (ref 80.0–98.0)
MPV: 10.4 fL — ABNORMAL HIGH (ref 6.0–10.0)
Monocytes: 3 % (ref 1–13)
Neutrophils Segmented: 85 % — ABNORMAL HIGH (ref 34–64)
Nucleated RBCs: 0 (ref 0–0)
Platelet Appearance: NORMAL
Platelets: 194 10*3/uL (ref 140–450)
RBC: 3.9 M/uL (ref 3.60–5.20)
RDW: 43.7 (ref 36.4–46.3)
WBC: 6.9 10*3/uL (ref 4.0–11.0)

## 2021-05-08 LAB — POC PREGNANCY UR-QUAL: Pregnancy, Urine: NEGATIVE

## 2021-05-08 LAB — LIPASE: Lipase: 30 U/L (ref 12–53)

## 2021-05-08 MED ORDER — OXYCODONE HCL 5 MG PO TABA
5 MG | Freq: Four times a day (QID) | ORAL | 0 refills | Status: DC | PRN
Start: 2021-05-08 — End: 2021-05-08

## 2021-05-08 MED ORDER — OXYCODONE HCL 5 MG PO TABA
5 MG | Freq: Four times a day (QID) | ORAL | 0 refills | Status: AC | PRN
Start: 2021-05-08 — End: 2021-05-11

## 2021-05-08 MED ORDER — MORPHINE SULFATE 4 MG/ML IJ SOLN
4 MG/ML | INTRAMUSCULAR | Status: AC
Start: 2021-05-08 — End: 2021-05-08
  Administered 2021-05-08: 19:00:00 4 mg via INTRAVENOUS

## 2021-05-08 MED ORDER — ONDANSETRON HCL 4 MG PO TABS
4 MG | ORAL_TABLET | Freq: Three times a day (TID) | ORAL | 0 refills | Status: DC | PRN
Start: 2021-05-08 — End: 2021-05-08

## 2021-05-08 MED ORDER — LACTATED RINGERS IV BOLUS
Freq: Once | INTRAVENOUS | Status: AC
Start: 2021-05-08 — End: 2021-05-08
  Administered 2021-05-08: 22:00:00 1000 mL via INTRAVENOUS

## 2021-05-08 MED ORDER — ALUM & MAG HYDROXIDE-SIMETH 200-200-20 MG/5ML PO SUSP
200-200-20 MG/5ML | ORAL | Status: AC
Start: 2021-05-08 — End: 2021-05-08
  Administered 2021-05-08: 19:00:00 30 mL via ORAL

## 2021-05-08 MED ORDER — MORPHINE SULFATE 4 MG/ML IJ SOLN
4 MG/ML | INTRAMUSCULAR | Status: AC
Start: 2021-05-08 — End: 2021-05-08
  Administered 2021-05-08: 22:00:00 4 mg via INTRAVENOUS

## 2021-05-08 MED ORDER — ONDANSETRON HCL 4 MG/2ML IJ SOLN
4 MG/2ML | Freq: Once | INTRAMUSCULAR | Status: AC
Start: 2021-05-08 — End: 2021-05-08
  Administered 2021-05-08: 19:00:00 4 mg via INTRAVENOUS

## 2021-05-08 MED ORDER — ONDANSETRON HCL 4 MG PO TABS
4 MG | ORAL_TABLET | Freq: Three times a day (TID) | ORAL | 0 refills | Status: DC | PRN
Start: 2021-05-08 — End: 2021-06-01

## 2021-05-08 MED FILL — MORPHINE SULFATE 4 MG/ML IJ SOLN: 4 mg/mL | INTRAMUSCULAR | Qty: 1

## 2021-05-08 MED FILL — ALUM & MAG HYDROXIDE-SIMETH 200-200-20 MG/5ML PO SUSP: 200-200-20 MG/5ML | ORAL | Qty: 30

## 2021-05-08 MED FILL — ONDANSETRON HCL 4 MG/2ML IJ SOLN: 4 MG/2ML | INTRAMUSCULAR | Qty: 2

## 2021-05-08 MED FILL — LACTATED RINGERS IV SOLN: INTRAVENOUS | Qty: 1000

## 2021-05-08 NOTE — ED Triage Notes (Signed)
Seen here 2 days ago, [place] yesterday. Here with upper abd pain. States she was prescribed some medications from [facility] but the pharmacy didn't fill it. Told EMS if she had the medications she would feel better.   Zofran and another unknown medication.   Nausea/ vomiting x 2 days.       Hx of asthma and pancreatitis.

## 2021-05-08 NOTE — ED Provider Notes (Shared)
[facility] Care  Emergency Department Treatment Report    Patient: Rhonda Hammond Age: 31 y.o. Sex: female    Date of Birth: Oct 07, 1990 Admit Date: 05/08/2021 PCP: None None   MRN: [id number]  CSN: [id number]     Room: 110/EO10 Time Dictated: 2:41 PM        Chief Complaint   Epigastric abdominal pain    History of Present Illness   31 y.o. 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  Patient has been in several ED's over the past few days to receive treatment for her abdominal pain.  She reports nausea and vomiting.  She denies fever, chills, chest pain, shortness of breath, diarrhea, or dysuria.  She does not recall any relieving or exacerbating factors of her symptoms.  She states that she has not been drinking any alcohol.    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     Intractable nausea and vomiting 06/26/2020    Obesity 12/16/2018    Pancreatitis      Past Surgical History:   Procedure Laterality Date    CESAREAN SECTION      CHOLECYSTECTOMY         Social History     Social History     Socioeconomic History    Marital status: Single   Tobacco Use    Smoking status: Every Day     Types: Cigars   Substance and Sexual Activity    Alcohol use: Yes     Alcohol/week: 21.0 standard drinks     Types: 21 Shots of liquor per week     Comment: 2-3 drinks a day, sometimes more    Drug use: Yes     Types: Marijuana Sheran Fava)       Family History     Family History   Problem Relation Age of Onset    No Known Problems Mother         Current Medications     Current Facility-Administered Medications   Medication Dose Route Frequency Provider Last Rate Last Admin    ondansetron (ZOFRAN) injection 4 mg  4 mg IntraVENous Once [employer], DO        morphine injection 4 mg  4 mg IntraVENous NOW Winifred Bodiford L Versia Mignogna, DO        aluminum & magnesium hydroxide-simethicone (MAALOX) 200-200-20 MG/5ML suspension 30 mL  30 mL Oral NOW Safiyya Stokes L Taji Barretto, DO        lactated ringers bolus  1,000 mL IntraVENous Once Fonda Kinder, DO         Current Outpatient Medications   Medication Sig Dispense Refill    pantoprazole (PROTONIX) 40 MG tablet Take 1 tablet by mouth every morning (before breakfast) 30 tablet 0    ondansetron (ZOFRAN-ODT) 4 MG disintegrating tablet Take 1 tablet by mouth 3 times daily as needed for Nausea or Vomiting 21 tablet 0    albuterol (ACCUNEB) 0.63 MG/3ML nebulizer solution Inhale 0.63 mg into  the lungs every 6 hours as needed      Naproxen Sodium 220 MG CAPS Take 220 mg by mouth as needed (Patient not taking: Reported on 04/20/2021)      ondansetron (ZOFRAN-ODT) 4 MG disintegrating tablet Take 4 mg by mouth every 8 hours as needed (Patient not taking: Reported on 04/20/2021)         Allergies     Allergies   Allergen Reactions    Hydromorphone Itching       Physical Exam     ED Triage Vitals   Enc Vitals Group      BP 05/08/21 1407 (!) 144/86      Heart Rate 05/08/21 1407 74      Resp 05/08/21 1407 18      Temp 05/08/21 1423 98.1 F (36.7 C)      Temp Source 05/08/21 1423 Temporal      SpO2 05/08/21 1407 97 %      Weight 05/08/21 1424 103 kg      Height 05/08/21 1424 1.702 m      Head Circumference --       Peak Flow --       Pain Score --       Pain Loc --       Pain Edu? --       Excl. in GC? --          Physical Exam  Vitals and nursing note reviewed.   Constitutional:       General: She is not in acute distress.     Appearance: She is not ill-appearing.   HENT:      Head: Normocephalic and atraumatic.      Nose: Nose normal. No  rhinorrhea.   Eyes:      General: No scleral icterus.     Extraocular Movements: Extraocular movements intact.   Cardiovascular:      Rate and Rhythm: Normal rate and regular rhythm.      Heart sounds: No murmur heard.  Pulmonary:      Effort: Pulmonary effort is normal. No respiratory distress.      Breath sounds: No wheezing, rhonchi or rales.   Chest:      Chest wall: No tenderness.   Abdominal:      General: Abdomen is flat.      Palpations: Abdomen is soft.      Tenderness: There is abdominal tenderness in the right upper quadrant and epigastric area. There is no guarding or rebound.   Musculoskeletal:         General: No swelling or signs of injury.   Skin:     General: Skin is warm and dry.      Capillary Refill: Capillary refill takes less than 2 seconds.   Neurological:      General: No focal deficit present.      Mental Status: She is alert.   Psychiatric:         Mood and Affect: Mood normal.         Behavior: Behavior normal.         Diagnostic Studies   Lab:   No results found for this or any previous visit (from the past 12 hour(s)).    Imaging:    No orders to display         My Interpretation of imaging shows *** on ***    EKG-***  Telemetry-***      Impression and Management Plan   31 year old  female is presenting to the ED for evaluation of epigastric abdominal pain.  Patient has a history of pancreatic pseudocyst and alcohol induced pancreatitis.    - Recheck abdominal labs and lipase  - IVF  - Pain control    DDX considered but not limited to ***      Medical Decision Making   Vital signs are within acceptable limits.     Patient comorbidities: see HPI    Data reviewed: Nursing notes, vital signs, prior visits, labs, imaging     External chart review performed: Reviewed CT of the abdomen pelvis from earlier today at [facility] facility-stable 2.8 cm presumed pancreatic pseudocyst at the pancreatic head, otherwise no acute intra-abdominal abnormalities    Tests and treatments considered:  ***    Social determinants affecting health: History of substance abuse    Multidisciplinary management discussions: ***    Shared decision making: Discussed all labs and imaging findings with patient. All questions were answered and patient is in agreement with plan for discharge/admission.     Threat to body function without assessment and management: Uncontrolled pain, pancreatitis    Severe exacerbation or progression of illness:     Critical Care:***    Patient looks well, in no acute distress, and non-toxic appearing at time of discharge. ***  ED Course        Procedures    Final Diagnosis   No diagnosis found.    Disposition   DISPOSITION      No follow-up provider specified.  New Prescriptions    No medications on file           Judithann Sauger, DO  May 08, 2021    My signature above authenticates this document and my orders, the final    diagnosis (es), discharge prescription (s), and instructions in the Epic    record.  If you have any questions please contact [phone].     Nursing notes have been reviewed by the physician/ advanced practice    Clinician.

## 2021-05-08 NOTE — Discharge Instructions (Addendum)
Please follow-up with gastroenterology. Return to the ED for uncontrolled symptoms or if you have any other concerns.

## 2021-05-08 NOTE — ED Notes (Signed)
5:20 PM  05/08/21     Discharge instructions given to Rhonda Hammond (name) with verbalization of understanding. Patient accompanied by self.  Patient discharged to home (destination).      Sloan Leiter, RN      Sloan Leiter, RN  05/08/21 [phone]

## 2021-05-08 NOTE — ED Notes (Signed)
Patient called stating that her pharmacy told her that she cannot pick up her" pain medicine" because her insurance would not cover it.  Oxycodone was E scribed to the pharmacy.  I told the patient I would try to reach them and find out what the issue was.  I called multiple times and no one will answer the doctor line.  I left a voicemail message for them asking for them to give me a call.     Dyanne Iha, LPN  19/14/78 [phone]

## 2021-05-08 NOTE — Case Communication (Signed)
This patient called again in regard to pain medication which was sent in after her visit for abdominal pain. States pharmacy [facility]) is telling her her insurance will not cover it and it costs over $100. I see my colleague's note stating she was unable to get in touch with anyone at the [facility]; I have attempted to call twice with no answer. I did access the patient's PMP indicating she filled a 4 day prescription for oxycodone on 05/02/21. She states she has an appointment with her PCP on the 1st. She has not contacted her gastroenterologist. I am hesitant to send a new narcotic prescription without being able to speak with pharmacy staff. Will have call back staff attempt to contact pharmacy again tomorrow.    [address] [place], [place]  05/09/21  2:54 PM

## 2021-05-08 NOTE — ED Notes (Signed)
Pt medicated as ordered for pain and nausea.  Pt in room crying because of abdominal discomfort.     Sloan Leiter, RN  05/08/21 1510

## 2021-05-09 ENCOUNTER — Inpatient Hospital Stay
Admit: 2021-05-09 | Discharge: 2021-05-09 | Disposition: A | Discharge status: Home / Self Care | Payer: PRIVATE HEALTH INSURANCE | Attending: Emergency Medicine

## 2021-05-09 DIAGNOSIS — K859 Acute pancreatitis without necrosis or infection, unspecified: Secondary | ICD-10-CM

## 2021-05-09 DIAGNOSIS — K861 Other chronic pancreatitis: Secondary | ICD-10-CM

## 2021-05-09 MED ORDER — DICYCLOMINE HCL 20 MG PO TABS
20 MG | ORAL_TABLET | Freq: Four times a day (QID) | ORAL | 0 refills | Status: DC | PRN
Start: 2021-05-09 — End: 2021-06-01

## 2021-05-09 MED ORDER — FAMOTIDINE 10 MG PO TABS
10 MG | ORAL | Status: AC
Start: 2021-05-09 — End: 2021-05-09
  Administered 2021-05-09: 23:00:00 20 mg via ORAL

## 2021-05-09 MED ORDER — LIDOCAINE VISCOUS HCL 2 % MT SOLN
2 % | OROMUCOSAL | Status: AC
Start: 2021-05-09 — End: 2021-05-09
  Administered 2021-05-09: 23:00:00 15 mL via OROMUCOSAL

## 2021-05-09 MED ORDER — PANTOPRAZOLE SODIUM 40 MG PO TBEC
40 MG | ORAL_TABLET | Freq: Two times a day (BID) | ORAL | 0 refills | Status: DC
Start: 2021-05-09 — End: 2021-10-12

## 2021-05-09 MED ORDER — SUCRALFATE 1 GM/10ML PO SUSP
1 GM/0ML | ORAL | Status: AC
Start: 2021-05-09 — End: 2021-05-09
  Administered 2021-05-09: 23:00:00 1 g via ORAL

## 2021-05-09 MED ORDER — HALOPERIDOL 5 MG PO TABS
5 MG | ORAL_TABLET | Freq: Four times a day (QID) | ORAL | 0 refills | Status: AC | PRN
Start: 2021-05-09 — End: 2021-12-19

## 2021-05-09 MED ORDER — PROMETHAZINE HCL 25 MG PO TABS
25 MG | ORAL_TABLET | Freq: Four times a day (QID) | ORAL | 0 refills | Status: AC | PRN
Start: 2021-05-09 — End: 2021-05-16

## 2021-05-09 MED ORDER — METOCLOPRAMIDE HCL 10 MG PO TABS
10 MG | ORAL | Status: AC
Start: 2021-05-09 — End: 2021-05-09
  Administered 2021-05-09: 23:00:00 10 mg via ORAL

## 2021-05-09 MED ORDER — DICYCLOMINE HCL 10 MG PO CAPS
10 MG | ORAL | Status: AC
Start: 2021-05-09 — End: 2021-05-09
  Administered 2021-05-09: 23:00:00 20 mg via ORAL

## 2021-05-09 MED ORDER — HALOPERIDOL 5 MG PO TABS
5 MG | ORAL | Status: AC
Start: 2021-05-09 — End: 2021-05-09
  Administered 2021-05-09: 23:00:00 5 mg via ORAL

## 2021-05-09 MED FILL — DICYCLOMINE HCL 10 MG PO CAPS: 10 MG | ORAL | Qty: 2

## 2021-05-09 MED FILL — SUCRALFATE 1 GM/10ML PO SUSP: 1 GM/0ML | ORAL | Qty: 10

## 2021-05-09 MED FILL — LIDOCAINE VISCOUS HCL 2 % MT SOLN: 2 % | OROMUCOSAL | Qty: 15

## 2021-05-09 MED FILL — HALOPERIDOL 5 MG PO TABS: 5 MG | ORAL | Qty: 1

## 2021-05-09 MED FILL — FAMOTIDINE 10 MG PO TABS: 10 MG | ORAL | Qty: 2

## 2021-05-09 MED FILL — METOCLOPRAMIDE HCL 10 MG PO TABS: 10 MG | ORAL | Qty: 1

## 2021-05-09 NOTE — ED Triage Notes (Signed)
Patient returns to the ED today following a visit here yesterday for ongoing abdominal pain and N/V.

## 2021-05-09 NOTE — ED Provider Notes (Signed)
EMERGENCY DEPARTMENT HISTORY AND PHYSICAL EXAM      Date: 05/09/2021  Patient Name: Rhonda Hammond    History of Presenting Illness     Chief Complaint   Patient presents with    Abdominal Pain       History Provided By: History provided by: Patient    Chief Complaint: Abdominal pain and pancreatitis flare    Additional History (Context): LAURALYNN Hammond is a 31 y.o. female who presents with abdominal pain and pancreatitis flare.  Patient was actually seen in this emergency department yesterday, was given prescriptions for discharge, however when she went to the pharmacy they did not accept her insurance and she was unable to afford the prescriptions, so she did not pick up anything.  Notes that she did have some Zofran, which she took and helped with her nausea, but is still having abdominal pain.  Pain is aching, epigastric, radiates through to her back.  This feels like her previous episodes of pancreatitis.  Denies any current alcohol abuse.    PCP: None None    Current Facility-Administered Medications   Medication Dose Route Frequency Provider Last Rate Last Admin    haloperidol (HALDOL) tablet 5 mg  5 mg Oral NOW Jaynie Collins, MD        sucralfate (CARAFATE) 1 GM/10ML suspension 1 g  1 g Oral NOW Jaynie Collins, MD        lidocaine viscous hcl (XYLOCAINE) 2 % solution 15 mL  15 mL Mouth/Throat NOW Jaynie Collins, MD        famotidine (PEPCID) tablet 20 mg  20 mg Oral NOW Jaynie Collins, MD        dicyclomine (BENTYL) capsule 20 mg  20 mg Oral NOW Jaynie Collins, MD        metoclopramide (REGLAN) tablet 10 mg  10 mg Oral NOW Jaynie Collins, MD         Current Outpatient Medications   Medication Sig Dispense Refill    haloperidol (HALDOL) 5 MG tablet Take 1 tablet by mouth 4 times daily as needed (abdominal pain) 30 tablet 0    dicyclomine (BENTYL) 20 MG tablet Take 1 tablet by mouth 4 times daily as needed (abdominal pain) 30 tablet 0    promethazine (PHENERGAN) 25 MG tablet Take 1 tablet by mouth every 6  hours as needed for Nausea (and/or abdominal cramping) 30 tablet 0    pantoprazole (PROTONIX) 40 MG tablet Take 1 tablet by mouth 2 times daily (before meals) 60 tablet 0    ondansetron (ZOFRAN) 4 MG tablet Take 1 tablet by mouth every 8 hours as needed for Nausea or Vomiting 12 tablet 0    oxyCODONE HCl 5 MG TABA Take 5 mg by mouth every 6 hours as needed (severe pain) for up to 3 days. 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     Intractable nausea and vomiting 06/26/2020    Obesity 12/16/2018    Pancreatitis        Past Surgical History:  Past Surgical History:   Procedure Laterality Date    CESAREAN SECTION      CHOLECYSTECTOMY         Family History:  Family History   Problem Relation Age of Onset    No Known Problems Mother        Social History:  Social History     Tobacco Use    Smoking status: Every Day     Types: Cigars   Substance Use Topics    Alcohol use: Yes     Alcohol/week: 21.0 standard drinks     Types: 21 Shots of liquor per week     Comment: 2-3 drinks a day, sometimes more    Drug use: Yes     Types: Marijuana Sheran Fava(Weed)       Allergies:  Allergies   Allergen Reactions    Hydromorphone Itching         Review of Systems   Review of Systems   Constitutional:  Negative for activity change, appetite change, diaphoresis, fatigue and fever.   HENT:  Negative for congestion, ear pain, mouth sores, nosebleeds, sore throat and trouble swallowing.    Eyes:  Negative for photophobia, pain, discharge, redness, itching and visual disturbance.   Respiratory:  Negative for cough, chest tightness, shortness of breath and wheezing.    Cardiovascular:  Negative for chest pain,  palpitations and leg swelling.   Gastrointestinal:  Positive for abdominal pain, nausea and vomiting. Negative for abdominal distention, constipation and diarrhea.   Endocrine: Negative for polydipsia, polyphagia and polyuria.   Genitourinary:  Negative for enuresis, flank pain, frequency and hematuria.   Musculoskeletal:  Negative for arthralgias, back pain, joint swelling, myalgias, neck pain and neck stiffness.   Skin:  Negative for pallor, rash and wound.   Neurological:  Negative for dizziness, tremors, seizures, facial asymmetry, weakness, light-headedness, numbness and headaches.   Hematological:  Does not bruise/bleed easily.   Psychiatric/Behavioral:  Negative for agitation, confusion, dysphoric mood, hallucinations, self-injury, sleep disturbance and suicidal ideas.    All other systems reviewed and are negative.    Physical Exam     ED Triage Vitals [05/09/21 1749]   BP Temp Temp Source Heart Rate Resp SpO2 Height Weight   133/80 99.1 F (37.3 C) Oral 77 18 98 % -- --      Physical Exam  Vitals and nursing note reviewed.   Constitutional:       General: She is not in acute distress.     Appearance: She is well-developed. She is obese. She is not ill-appearing.   HENT:      Head: Normocephalic and atraumatic.      Mouth/Throat:      Mouth: Mucous membranes are moist.      Pharynx: Oropharynx is clear.   Eyes:      Extraocular Movements: Extraocular movements intact.      Pupils: Pupils are equal, round, and reactive to light.   Cardiovascular:      Rate and Rhythm: Normal rate and regular rhythm.      Heart sounds: Normal heart sounds. No murmur heard.  Pulmonary:      Effort: Pulmonary effort is normal.      Breath sounds: Normal breath sounds. No wheezing.   Abdominal:      General: Bowel sounds are normal. There is no distension.      Palpations: Abdomen is soft.      Tenderness: There is abdominal tenderness in the epigastric area. There is no  right CVA tenderness, left CVA tenderness, guarding or  rebound. Negative signs include Murphy's sign, Rovsing's sign and McBurney's sign.   Skin:     Capillary Refill: Capillary refill takes less than 2 seconds.   Neurological:      General: No focal deficit present.      Mental Status: She is alert.   Psychiatric:         Behavior: Behavior normal.           Diagnostic Study Results     Labs -   No results found for this or any previous visit (from the past 12 hour(s)).    Radiologic Studies -   No orders to display          Medical Decision Making   I am the first provider for this patient.    I reviewed the vital signs, available nursing notes, past medical history, past surgical history, family history and social history.    Vital Signs-Reviewed the patient's vital signs.    Records Reviewed: I reviewed patient's previous CT scans which show pancreatic cysts/pseudocyst, as well as patient's laboratory studies from yesterday which are reassuring.    ED Course:      Remained stable during her emergency department stay, felt better after treatment    Other considerations:   Severe exacerbation or progression of chronic illness: Acute exacerbation of chronic pancreatitis    Threat to body function without evaluation and management: Pancreatitis can be acute life-threatening    SOCIAL DETERMINANTS impacting Evaluation and Management: Patient's alcohol abuse complicates her current pancreatitis    I considered the following testing, treatment, or disposition:   Labs to include CBC and CMP and lipase and CT scan of abdomen and pelvis with IV contrast, but decided not to pursue due to his history and exam are adequate to guide her management and disposition    Brief differential diagnosis includes acute pancreatitis, chronic pancreatitis, dyspepsia, GERD, and a host of acute intra-abdominal surgical and infectious processes    Disposition:  Discharge    DISCHARGE NOTE:     Pt has been reexamined. Patient has no new complaints, changes, or physical findings.  Care plan  outlined and precautions discussed.  Results of labs from yesterday were reviewed with the patient. All medications were reviewed with the patient; will d/c home with symptomatic medications as below. All of pt's questions and concerns were addressed. Patient was instructed and agrees to follow up with primary care, as well as to return to the ED upon further deterioration. Patient is ready to go home.    Follow-up:  Yisroel Ramming, MD  [address]  [address]  [place] [address]  [phone]    Schedule an appointment as soon as possible for a visit in 1 week      your primary care provider    Call in 3 days  As needed, If symptoms worsen    [facility] EMERGENCY DEPT  [address] [place] [address]  [phone]    As needed, If symptoms worsen           Medication List        START taking these medications      dicyclomine 20 MG tablet  Commonly known as: BENTYL  Take 1 tablet by mouth 4 times daily as needed (abdominal pain)     haloperidol 5 MG tablet  Commonly known as: HALDOL  Take 1 tablet by mouth 4 times daily as  needed (abdominal pain)     pantoprazole 40 MG tablet  Commonly known as: PROTONIX  Take 1 tablet by mouth 2 times daily (before meals)     promethazine 25 MG tablet  Commonly known as: PHENERGAN  Take 1 tablet by mouth every 6 hours as needed for Nausea (and/or abdominal cramping)            ASK your doctor about these medications      albuterol 0.63 MG/3ML nebulizer solution  Commonly known as: ACCUNEB     ondansetron 4 MG tablet  Commonly known as: Zofran  Take 1 tablet by mouth every 8 hours as needed for Nausea or Vomiting     oxyCODONE HCl 5 MG Taba  Take 5 mg by mouth every 6 hours as needed (severe pain) for up to 3 days. Max Daily Amount: 20 mg               Where to Get Your Medications        Information about where to get these medications is not yet available    Ask your nurse or doctor about these medications  dicyclomine 20 MG tablet  haloperidol 5  MG tablet  pantoprazole 40 MG tablet  promethazine 25 MG tablet           Medical Decision Making   31 year old female with acute exacerbation of chronic pancreatitis, has benign abdominal exam, I feel she can be managed with oral medications as an outpatient, will give symptomatic meds as above, return precautions, outpatient primary care and gastroenterology follow-up.      Diagnosis     Clinical Impression:     ICD-10-CM    1. Acute on chronic pancreatitis (HCC)  K85.90     K86.1       2. Abdominal pain, epigastric  R10.13       3. Dyspepsia  R10.13                   Jaynie Collins, MD  05/09/21 [phone]

## 2021-05-17 ENCOUNTER — Inpatient Hospital Stay
Admit: 2021-05-17 | Discharge: 2021-05-17 | Disposition: A | Discharge status: Home / Self Care | Payer: PRIVATE HEALTH INSURANCE | Attending: Emergency Medicine

## 2021-05-17 DIAGNOSIS — R1010 Upper abdominal pain, unspecified: Secondary | ICD-10-CM

## 2021-05-17 MED ORDER — ONDANSETRON 4 MG PO TBDP
4 MG | Freq: Once | ORAL | Status: AC
Start: 2021-05-17 — End: 2021-05-17
  Administered 2021-05-17: 22:00:00 4 mg via SUBLINGUAL

## 2021-05-17 MED ORDER — OXYCODONE-ACETAMINOPHEN 5-325 MG PO TABS
5-325 MG | ORAL_TABLET | Freq: Four times a day (QID) | ORAL | 0 refills | Status: AC | PRN
Start: 2021-05-17 — End: 2021-05-20

## 2021-05-17 MED ORDER — OXYCODONE-ACETAMINOPHEN 5-325 MG PO TABS
5-325 MG | ORAL | Status: AC
Start: 2021-05-17 — End: 2021-05-17
  Administered 2021-05-17: 22:00:00 1 via ORAL

## 2021-05-17 MED FILL — ONDANSETRON 4 MG PO TBDP: 4 MG | ORAL | Qty: 1

## 2021-05-17 MED FILL — OXYCODONE-ACETAMINOPHEN 5-325 MG PO TABS: 5-325 MG | ORAL | Qty: 1

## 2021-05-17 NOTE — Discharge Instructions (Addendum)
Call your insurance carrier to get a list of GI physicians who will take your insurance    Your labs have returned within normal limits today while you were at [facility] no need to repeat    Use your Zofran odt for nausea you have at home    Please do not drink or drive on the medications you have been prescribed.  These medications should be used only when you are in a safe environment. The medications can cause you to be sleepy. Be careful when using Tylenol with these drugs. You should have no more than 3 grams of tylenol in 24 hours. Check your over the counter bottle for the amount of tylenol in the product.  The medication you have been prescribed contains 325mg  per dose. Please ask your local pharmacist if you have any questions about combining tylenol with this medication.

## 2021-05-17 NOTE — ED Triage Notes (Signed)
Pt states that she is having pain across the top of her abdomen. She has had similar pain in the past when she had pancreatitis. Pt states that she has had her gallbladder out and needs to find a GI doctor.

## 2021-05-17 NOTE — ED Notes (Signed)
Pt comes by ems from home for abd pain- seen earlier today at [facility] for this and every other day of the week.  Asked pt what she was looking for from this ER- she states it just keeps hurting     Asked if she had GI doctor and she said no they were trying to help me yesterday find one that my medicaid would accept      Elby Beck, RN  05/17/21 1627

## 2021-05-17 NOTE — ED Notes (Signed)
Patient well appearing at time of discharge. Education provided for home care of patient, medication use, and follow up as referred by provider. Patient verbalized understanding.       Levy Pupa, RN  05/17/21 [phone]

## 2021-05-17 NOTE — ED Provider Notes (Incomplete)
[facility]  Emergency Department Treatment Report    Patient: Rhonda Hammond Age: 31 y.o. Sex: female    Date of Birth: 1991/01/11 Admit Date: 05/17/2021 PCP: None None   MRN: [id number]  CSN: [id number]  Attending: Fonda Kinder, DO   Room: [phone] Time Dictated: 6:03 PM APP: Shireen Quan, PA-C             Chief Complaint   Chief Complaint   Patient presents with    Abdominal Pain        History of Present Illness     31 y.o. female with a history of chronic abdominal pain, intractable nausea and vomiting, acute on chronic pancreatitis, cannabinoid use, and pancreatic pseudocyst presents to the emergency department with complaints of abdominal pain.  Abdominal pain has been present for the past year but it seems to have been worsening over the past month.  She states that the pain radiates across the top of her abdomen similar to pain in the past when she had pancreatitis.  Patient was seen today at [facility] however she left prior to a formal discharge.  She states she has a long chronic history of abdominal pain and has been to multiple ERs.  She has been referred to gastroenterology however the last the gastrointestinal physician did not accept her insurance.  She denies any hematemesis.  She denies blood in the stool.  Pain is a sharp crampy type pain.  Review of Systems     Constitutional: No fever, chills, or weight loss  Eyes: No visual symptoms.  ENT: No sore throat, runny nose or ear pain.  Respiratory: No cough, dyspnea or wheezing.  Cardiovascular: No chest pain, pressure, palpitations, tightness or heaviness.  Gastrointestinal:  vomiting,  abdominal pain.  Genitourinary: No dysuria, frequency, or urgency.  Musculoskeletal: No joint pain or swelling.  Integumentary: No rashes.  Neurological: No headaches, sensory or motor symptoms.  Denies complaints in all other systems.      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     Intractable nausea and vomiting 06/26/2020    Obesity 12/16/2018    Pancreatitis      Past Surgical History:   Procedure Laterality Date    CESAREAN SECTION      CHOLECYSTECTOMY           Social History     Social History     Socioeconomic History    Marital status: Single   Tobacco Use    Smoking status: Every Day     Types: Cigars   Substance and Sexual Activity    Alcohol use: Yes     Alcohol/week: 21.0 standard drinks     Types: 21 Shots of liquor per week     Comment: 2-3 drinks a day, sometimes more    Drug use: Yes     Types: Marijuana Sheran Fava)       Family History     Family History   Problem Relation Age of Onset    No Known Problems Mother        Current Medications     Previous Medications    ALBUTEROL (ACCUNEB) 0.63 MG/3ML NEBULIZER SOLUTION    Inhale 0.63 mg into the lungs every 6 hours as  needed    DICYCLOMINE (BENTYL) 20 MG TABLET    Take 1 tablet by mouth 4 times daily as needed (abdominal pain)    HALOPERIDOL (HALDOL) 5 MG TABLET    Take 1 tablet by mouth 4 times daily as needed (abdominal pain)    ONDANSETRON (ZOFRAN) 4 MG TABLET    Take 1 tablet by mouth every 8 hours as needed for Nausea or Vomiting    PANTOPRAZOLE (PROTONIX) 40 MG TABLET    Take 1 tablet by mouth 2 times daily (before meals)         Allergies     Allergies   Allergen Reactions    Hydromorphone Itching       Physical Exam     ED Triage Vitals [05/17/21 1631]   Enc Vitals Group      BP (!) 140/90      Pulse 69      Respirations 16      Temp 98 F (36.7 C)      Temp Source Oral      SpO2 100 %       Constitutional: Patient appears well developed and well nourished.  Appearance and behavior are age and situation appropriate.  HEENT: Conjunctiva clear.  PERRL. Bilateral TM non edematous or erythematous. Mucous membranes moist, non-erythematous. Surface of the pharynx, palate, and  tongue are pink, moist and without lesions.  Neck: supple, non tender, symmetrical, no masses or JVD.   Respiratory: lungs clear to auscultation, nonlabored respirations. No tachypnea or accessory muscle use.  Cardiovascular: heart regular rate and rhythm without murmur rubs or gallops. Distal pulses 2+ and equal bilaterally.  No peripheral edema.  Gastrointestinal:  Abdomen soft, tender with complaint of pain to palpation epigastrium with  no peritoneal findings.   Musculoskeletal: Back/Spine: No stepoffs, no tenderness to palpation midline, , no lacerations/abrasions/ecchymosis  Ext: Warm/well perfused, DP/PT/radial pulses palpable bilaterally, no bony tenderness, no obvious deformities or lesions.  Integumentary: warm and dry without rashes or lesions  Neurologic: alert and oriented,      Impression and Management Plan     31 year old afebrile nontoxic-appearing female who presents to the emergency department with complaints of abdominal pain.  This feels like her normal abdominal pain and she has been seen this morning at [facility].  Differential does include pancreatic pseudocyst pancreatitis peptic ulcer disease gastritis.  At this time we will review all labs          This patient was evaluated in the emergency department for symptoms described in the history of present illness.  They were evaluated in the context of global COVID-19 pandemic, which necessitated consideration of the patient may be at  risk for infection with the SARS-COV-2 virus that causes COVID-19.  Institutional protocols and algorithms that pertain to the evaluation of patients at risk for COVID-19 are in a state of rapid change based on the information released by regulatory bodies including the CDC and federal state organizations.  These policies and algorithms were followed during the patient's care in the emergency department.          Diagnostic Studies     Lab:   No results found for this or any previous visit  (from the past 12 hour(s)).    Imaging:    No orders to display         No results found for any visits on 05/17/21.      ED Course/ Medical Decision Making  Medications   oxyCODONE-acetaminophen (PERCOCET) 5-325 MG per tablet 1 tablet (has no administration in time range)   ondansetron (ZOFRAN-ODT) disintegrating tablet 4 mg (has no administration in time range)                 RECORDS REVIEWED:  I reviewed the patient's previous records here at [facility] and available outside facilities and note that ***    EXTERNAL RESULTS REVIEWED:        ABNORMAL) CBC WITH DIFFERENTIAL AUTO (05/17/2021 6:53 AM EDT)  Lab Results - (ABNORMAL) CBC WITH DIFFERENTIAL AUTO (05/17/2021 6:53 AM EDT)  Component Value Ref Range Test Method Analysis Time Performed At Pathologist Signature   WBC 6.2 4.0 - 11.0 K/uL   05/17/2021 8:01 AM EDT [facility]     RBC 3.96 3.80 - 5.20 M/uL   05/17/2021 8:01 AM EDT [facility]     HGB 10.1 (L) 11.7 - 15.5 g/dL   16/10/9602 5:40 AM EDT [facility]     HCT 29.1 (L) 35.1 - 46.5 %   05/17/2021 8:01 AM EDT [facility]     MCV 74 (L) 80 - 99 fL   05/17/2021 8:01 AM EDT [facility]     MCH 26 26 - 34 pg   05/17/2021 8:01 AM EDT [facility]     MCHC 35 31 - 36 g/dL   98/11/9145 8:29 AM EDT [facility]     RDW 15.7 (H) 10.0 - 15.5 %   05/17/2021 8:01 AM EDT [facility]     Platelet 247 140 - 440 K/uL   05/17/2021 8:01 AM EDT [facility]     MPV 10.8 9.0 - 13.0 fL   05/17/2021 8:01 AM EDT [facility]     Segmented Neutrophils (Auto) 60 40 - 75 %   05/17/2021 8:01 AM EDT [facility]     Lymphocytes (Auto) 28 20 - 45 %   05/17/2021 8:01 AM EDT [facility]     Monocytes (Auto) 10 3 - 12 %   05/17/2021 8:01 AM EDT [facility]     Eosinophils (Auto) 2 0 - 6 %   05/17/2021 8:01 AM EDT [facility]     Basophils (Auto) 0 0 - 2 %   05/17/2021 8:01 AM EDT [facility]      Absolute Neutrophils (Auto) 3.7 1.8 - 7.7 K/uL   05/17/2021 8:01 AM EDT [facility]     Absolute Lymphocytes (Auto) 1.7 1.0 - 4.8 K/uL   05/17/2021 8:01 AM EDT [facility]     Absolute Monocytes (Auto) 0.6 0.1 - 1.0 K/uL   05/17/2021 8:01 AM EDT [facility]     Absolute Eosinophils (Auto) 0.1 0.0 - 0.5 K/uL   05/17/2021 8:01 AM EDT [facility]     Absolute Basophils (Auto) 0.0 0.0 - 0.2 K/uL   05/17/2021 8:01 AM EDT [facility]       Lab Results - (ABNORMAL) CBC WITH DIFFERENTIAL     Back to top of Lab Results       HEPATIC FUNCTION PANEL (05/17/2021 6:53 AM EDT)  Lab Results - HEPATIC FUNCTION PANEL (05/17/2021 6:53 AM EDT)  Component Value Ref Range Test Method Analysis Time Performed At Pathologist Signature   Albumin 4.0 3.5 - 5.0 g/dL [facility] [id number]-HYQM-VHQ-4_[facility]EX [facility]   05/17/2021 8:23 AM EDT [facility]     Total Protein  6.9 6.4 - 8.3 g/dL [facility] ZOXW-RUEA-[id number]-9_[id number] [facility]   05/17/2021 8:23 AM EDT [facility]     Globulin 2.9 2.0 - 4.0 g/dL [facility] YNWG-NFAO-ZHY-8_MVHQI [facility]   05/17/2021 8:23 AM EDT [facility]     A/G Ratio 1.4 1.1 - 2.6 ratio [facility] ANTI-SARS-COV-2_[facility]   05/17/2021 8:23 AM EDT [facility]     Bilirubin Total 0.3 0.2 - 1.2 mg/dL [facility] ONGE-XBMW-UXL-2_GMWNU [facility]   05/17/2021 8:23 AM EDT [facility]     Bilirubin Direct <0.2 0.0 - 0.3 mg/dL [facility] UVOZ-DGUY-QIH-4_VQQVZ [facility]   05/17/2021 8:23 AM EDT [facility]     SGOT (AST) 11 10 - 37 U/L [facility]COV[facility]   05/17/2021 8:23 AM EDT [facility]     Alkaline Phosphatase 56 25 - 115 U/L [facility]COV[facility]   05/17/2021 8:23 AM EDT [facility]     SGPT (ALT) 10 5 - 40 U/L [facility]SARS[facility]COV[facility]   05/17/2021 8:23 AM EDT [facility]            LIPASE  (05/17/2021 6:53 AM EDT)  Lab Results - LIPASE (05/17/2021 6:53 AM EDT)  Component Value Ref Range Test Method Analysis Time Performed At Pathologist Signature   Lipase <20 7 - 60 U/L [facility]OV[facility]   05/17/2021 8:27 AM EDT [facility]            (ABNORMAL) CBC WITH DIFFERENTIAL (05/17/2021 6:53 AM EDT)  Lab Results - (ABNORMAL) CBC WITH DIFFERENTIAL (05/17/2021 6:53 AM EDT)  Specimen (Source) Anatomical Location / Laterality Collection Method / Volume Collection Time Received Time   Blood BLOOD / Unknown   05/17/2021 6:53 AM EDT              (ABNORMAL) BASIC METABOLIC PANEL (05/17/2021 6:53 AM EDT)  Lab Results - (ABNORMAL) BASIC METABOLIC PANEL (05/17/2021 6:53 AM EDT)  Component Value Ref Range Test Method Analysis Time Performed At Pathologist Signature   Potassium 3.8 3.5 - 5.5 mmol/L [facility]   05/17/2021 8:23 AM EDT [facility]     Sodium 139 133 - 145 mmol/L [facility] ANTI-SARS-COV[facility]   05/17/2021 8:23 AM EDT [facility]     Chloride 106 98 - 110 mmol/L [facility]TI-SARS[facility]COV[facility]   05/17/2021 8:23 AM EDT [facility]     Glucose 91 70 - 99 mg/dL [facility] DGLO-VFIE-PPI-9_JJOAC [facility]   05/17/2021 8:23 AM EDT [facility]     Calcium 9.0 8.4 - 10.5 mg/dL [facility] ZYSA-YTKZ-SWF-0_XNATF [facility]_EUA   05/17/2021 8:23 AM EDT [facility]     BUN 5 (L) 6 - 22 mg/dL [facility] TDDU-KGUR-KYH-0_WCBJS [facility]_E[facility]   05/17/2021 8:23 AM EDT [facility]     Creatinine 0.5 0.5 - 1.2 mg/dL [facility] EGBT-DVVO-HYW-7_PXTGG [facility]_EUA   05/17/2021 8:23 AM EDT [facility]     CO2 24 20 - 32 mmol/L [facility] ANTI-SARS-COV[facility]   05/17/2021 8:23 AM EDT [facility]     eGFR >60.0 >60.0 mL/min/1.73 sq.m. [facility]TI-SARS-COV[facility]   05/17/2021 8:23 AM EDT [facility]     Comment:    eGFR calculation based on the Chronic Kidney Disease Epidemiology Collaboration (CKD-EPI) equation refit without adjustment for race.     This eGFR is validated for stable chronic renal failure patients. This equation is unreliable in acute illness or patients with normal renal function.     Anion Gap 9.0 3.0 - 15.0 mmol/L [facility]  ANTI-SARS-COV-2_[facility]   05/17/2021 8:23 AM EDT [facility]     Comment: Anion Gap calculation based on electrolyte reference ranges.        PREGNANCY URINE (05/17/2021 6:26 AM EDT)  Lab Results - PREGNANCY URINE (05/17/2021 6:26 AM EDT)  Component Value Ref Range Test Method Analysis Time Performed At Pathologist Signature   PREGNANCY URINE Negative Negative   05/17/2021 7:57 AM EDT [facility]            (ABNORMAL) Urinalysis (05/17/2021 6:26 AM EDT)  Lab Results - (ABNORMAL) Urinalysis (05/17/2021 6:26 AM EDT)  Component Value Ref Range Test Method Analysis Time Performed At Pathologist Signature   Urine Color Yellow Colorless, Pale Yellow, Light Yellow, Yellow, Dark Yellow, Straw   05/17/2021 7:55 AM EDT [facility]     Urine Clarity Clear Clear, Slightly Cloudy   05/17/2021 7:55 AM EDT [facility]     Urine pH 7.0 5.0 - 8.0 pH   05/17/2021 7:55 AM EDT [facility]     Urine Protein Screen Trace Negative, Trace mg/dL   13/08/6576 4:69 AM EDT [facility]     Urine Glucose Negative Negative mg/dL   62/95/2841 3:24 AM EDT [facility]     Urine Ketones Trace (A) Negative mg/dL   40/10/2723 3:66 AM EDT [facility]     Urine Occult Blood Negative Negative   05/17/2021 7:55 AM EDT [facility]     Urine Specific Gravity 1.015 1.005 - 1.030   05/17/2021 7:55 AM EDT [facility]     Urine Nitrite Negative Negative   05/17/2021 7:55 AM EDT  [facility]     Urine Leukocyte Esterase Negative Negative   05/17/2021 7:55 AM EDT [facility]     Urine Bilirubin Negative Negative   05/17/2021 7:55 AM EDT [facility]     Urine Urobilinogen 2.0 (H) <2.0 mg/dL mg/dL   44/03/4740 5:95 AM EDT [facility]         INDEPENDENT HISTORIAN:  History and/or plan development assisted by: ***    Severe exacerbation or progression of chronic illness:***      Threat to body function without evaluation and management:  ***      SOCIAL DETERMINANTS  impacting Evaluation and Management: ***      Comorbidities impacting Evaluation and Management: ***      Critical Care Time (if necessary) ***      I am the first provider for this patient.     I reviewed the vital signs, available nursing notes, past medical history, past surgical history, family history and social history.      I have spoken to Fonda Kinder, DO regarding this patient's care and we discussed ***    I considered the following testing, treatment, or disposition *** but decided not to pursue due to ***    I considered prescribing *** and ***     Final Diagnosis     No diagnosis found.      Disposition          Medication List        ASK your doctor about these medications      albuterol 0.63 MG/3ML nebulizer solution  Commonly known as: ACCUNEB     dicyclomine 20 MG  tablet  Commonly known as: BENTYL  Take 1 tablet by mouth 4 times daily as needed (abdominal pain)     haloperidol 5 MG tablet  Commonly known as: HALDOL  Take 1 tablet by mouth 4 times daily as needed (abdominal pain)     ondansetron 4 MG tablet  Commonly known as: Zofran  Take 1 tablet by mouth every 8 hours as needed for Nausea or Vomiting     pantoprazole 40 MG tablet  Commonly known as: PROTONIX  Take 1 tablet by mouth 2 times daily (before meals)                 Patient discharged stable to     [facility]  May 17, 2021        The patient was personally evaluated by myself and Fonda Kinder, DO who agrees with the above assessment and plan.    My signature above authenticates this document and my orders, the final    diagnosis (es), discharge prescription (s), and instructions in the Epic    record.  If you have any questions please contact [phone].     Nursing notes have been reviewed by the physician/ advanced practice    Clinician.  Dragon medical dictation software was used for portions of this report. Unintended voice recognition errors may occur.

## 2021-05-24 ENCOUNTER — Inpatient Hospital Stay
Admit: 2021-05-24 | Discharge: 2021-05-24 | Disposition: A | Discharge status: Home / Self Care | Payer: PRIVATE HEALTH INSURANCE | Attending: Emergency Medicine

## 2021-05-24 DIAGNOSIS — R1084 Generalized abdominal pain: Secondary | ICD-10-CM

## 2021-05-24 LAB — COMPREHENSIVE METABOLIC PANEL
ALT: 8 U/L — ABNORMAL LOW (ref 10–49)
AST: 24 U/L (ref 0.0–33.9)
Albumin: 4 gm/dl (ref 3.4–5.0)
Alkaline Phosphatase: 50 U/L (ref 46–116)
Anion Gap: 6 mmol/L (ref 5–15)
BUN: 6 mg/dl — ABNORMAL LOW (ref 9–23)
CO2: 23 mEq/L (ref 20–31)
Calcium: 9.7 mg/dl (ref 8.7–10.4)
Chloride: 110 mEq/L — ABNORMAL HIGH (ref 98–107)
Creatinine: 0.48 mg/dl — ABNORMAL LOW (ref 0.55–1.02)
GFR African American: >60
GFR Non-African American: >60
Glucose: 93 mg/dl (ref 74–106)
Potassium: 4.9 mEq/L (ref 3.5–5.1)
Sodium: 139 mEq/L (ref 136–145)
Total Bilirubin: 0.2 mg/dl — ABNORMAL LOW (ref 0.30–1.20)
Total Protein: 7.9 gm/dl (ref 5.7–8.2)

## 2021-05-24 LAB — POCT URINALYSIS DIPSTICK
Bilirubin, Urine: NEGATIVE
Blood, Urine: NEGATIVE
Glucose, Ur: NEGATIVE mg/dl
Ketones, Urine: NEGATIVE mg/dl
Leukocyte Esterase, Urine: NEGATIVE
Nitrite, Urine: NEGATIVE
Protein, Urine: NEGATIVE mg/dl
Specific Gravity, Urine: 1.02 (ref 1.005–1.030)
Urobilinogen, Urine: 0.2 EU/dl (ref 0.0–1.0)
pH, Urine: 8.5 (ref 5–9)

## 2021-05-24 LAB — CBC WITH AUTO DIFFERENTIAL
Basophils: 0.3 % (ref 0–3)
Eosinophils: 3.1 % (ref 0–5)
Hematocrit: 29.4 % — ABNORMAL LOW (ref 37.0–50.0)
Hemoglobin: 10.4 gm/dl — ABNORMAL LOW (ref 13.0–17.2)
Immature Granulocytes: 0.1 % (ref 0.0–3.0)
Lymphocytes: 29 % (ref 28–48)
MCH: 25.6 pg (ref 25.4–34.6)
MCHC: 35.4 gm/dl (ref 30.0–36.0)
MCV: 72.4 fL — ABNORMAL LOW (ref 80.0–98.0)
MPV: 11.1 fL — ABNORMAL HIGH (ref 6.0–10.0)
Monocytes: 9.5 % (ref 1–13)
Neutrophils Segmented: 58 % (ref 34–64)
Nucleated RBCs: 0 (ref 0–0)
Platelets: 222 10*3/uL (ref 140–450)
RBC: 4.06 M/uL (ref 3.60–5.20)
RDW: 42.8 (ref 36.4–46.3)
WBC: 6.7 10*3/uL (ref 4.0–11.0)

## 2021-05-24 LAB — LIPASE: Lipase: 45 U/L (ref 12–53)

## 2021-05-24 MED ORDER — SODIUM CHLORIDE (PF) 0.9 % IJ SOLN
0.9 % | Freq: Once | INTRAMUSCULAR | Status: AC
Start: 2021-05-24 — End: 2021-05-24
  Administered 2021-05-24: 20:00:00 40 mg via INTRAVENOUS

## 2021-05-24 MED ORDER — ONDANSETRON HCL 4 MG/2ML IJ SOLN
4 MG/2ML | Freq: Once | INTRAMUSCULAR | Status: AC
Start: 2021-05-24 — End: 2021-05-24
  Administered 2021-05-24: 20:00:00 4 mg via INTRAVENOUS

## 2021-05-24 MED ORDER — MORPHINE SULFATE 4 MG/ML IJ SOLN
4 MG/ML | INTRAMUSCULAR | Status: AC
Start: 2021-05-24 — End: 2021-05-24
  Administered 2021-05-24: 21:00:00 4 mg via INTRAVENOUS

## 2021-05-24 MED ORDER — SODIUM CHLORIDE 0.9 % IV BOLUS
0.9 % | Freq: Once | INTRAVENOUS | Status: AC
Start: 2021-05-24 — End: 2021-05-24
  Administered 2021-05-24: 20:00:00 1000 mL via INTRAVENOUS

## 2021-05-24 MED ORDER — OXYCODONE-ACETAMINOPHEN 5-325 MG PO TABS
5-325 MG | ORAL | Status: AC
Start: 2021-05-24 — End: 2021-05-24
  Administered 2021-05-24: 20:00:00 1 via ORAL

## 2021-05-24 MED FILL — MORPHINE SULFATE 4 MG/ML IJ SOLN: 4 mg/mL | INTRAMUSCULAR | Qty: 1

## 2021-05-24 MED FILL — PANTOPRAZOLE SODIUM 40 MG IV SOLR: 40 MG | INTRAVENOUS | Qty: 40

## 2021-05-24 MED FILL — ONDANSETRON HCL 4 MG/2ML IJ SOLN: 4 MG/2ML | INTRAMUSCULAR | Qty: 2

## 2021-05-24 MED FILL — OXYCODONE-ACETAMINOPHEN 5-325 MG PO TABS: 5-325 MG | ORAL | Qty: 1

## 2021-05-24 NOTE — ED Triage Notes (Signed)
Pt comes by ems from home for abd pain since yest hx of pancreatis.       Pt here recently for same so will wait for provider to evaluate to determine what orders they want done

## 2021-05-24 NOTE — ED Notes (Signed)
Discharge instruction given to patient. Patient is asking why she didn't get any prescription for pain. Referred to Dr. Carmela Hurt who stated patient just received a prescription yesterday Zofran and Tylenol and will not give more prescription. Patient left ambulatory unsatisfied on the disposition.     Informed Diplomatic Services operational officer to arrange a medicaid cab for this patient     Guy Franco, RN  05/24/21 1725

## 2021-05-24 NOTE — ED Provider Notes (Signed)
[facility]  Emergency Department Treatment Report        Patient: Rhonda Hammond Age: 31 y.o. Sex: female    Date of Birth: 1990-08-21 Admit Date: 05/24/2021 PCP: None None   MRN: [id number]  CSN: [id number]  @   Room: ON62/XB28 Time Dictated: 9:24 PM            Chief Complaint   Chief Complaint   Patient presents with    Abdominal Pain       History of Present Illness   This is a 31 y.o. female with history of alcohol use disorder, alcohol induced chronic pancreatitis, acute on chronic pancreatitis, pancreatic pseudocyst, chronic abdominal pain, cannabis use, cocaine abuse, gastritis, presents to the emergency room today with chief complaint of abdominal pain that is in the epigastric region, midline that radiates around to the left and sometimes through to her back, made better by a hot shower, associated with nausea and vomiting, but not diarrhea.  This pain is consistent with prior abdominal pain. she denies dysuria or chance of pregnancy.  She states its been weeks since she has had had any alcohol but she is continuing to havepain and she has an appointment with a gastroenterologist in June.  She had a negative pregnancy test yesterday, she had no UTI yesterday,    Review of Systems   Review of Systems   Constitutional:  Negative for fever.   Gastrointestinal:  Positive for abdominal pain, nausea and vomiting. Negative for blood in stool and diarrhea.   Genitourinary:  Negative for dysuria.       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     Intractable nausea and vomiting 06/26/2020    Obesity 12/16/2018    Pancreatitis      Past Surgical History:   Procedure Laterality Date    CESAREAN SECTION      CHOLECYSTECTOMY         Social History     Social History     Socioeconomic History    Marital status:  Single     Spouse name: Not on file    Number of children: Not on file    Years of education: Not on file    Highest education level: Not on file   Occupational History    Not on file   Tobacco Use    Smoking status: Every Day     Types: Cigars    Smokeless tobacco: Not on file   Substance and Sexual Activity    Alcohol use: Yes     Alcohol/week: 21.0 standard drinks     Types: 21 Shots of liquor per week     Comment: 2-3 drinks a day, sometimes more    Drug use: Yes     Types: Marijuana Sheran Fava)    Sexual activity: Not on file   Other Topics Concern    Not on file   Social History Narrative    Not on file     Social Determinants of Health     Financial Resource Strain: Not on file   Food Insecurity: Not on file   Transportation Needs: Not on file   Physical Activity: Not on file   Stress: Not on file   Social Connections: Not on file  Intimate Partner Violence: Not on file   Housing Stability: Not on file       Family History     Family History   Problem Relation Age of Onset    No Known Problems Mother        Current Medications     No current facility-administered medications for this encounter.     Current Outpatient Medications   Medication Sig Dispense Refill    haloperidol (HALDOL) 5 MG tablet Take 1 tablet by mouth 4 times daily as needed (abdominal pain) 30 tablet 0    dicyclomine (BENTYL) 20 MG tablet Take 1 tablet by mouth 4 times daily as needed (abdominal pain) 30 tablet 0    pantoprazole (PROTONIX) 40 MG tablet Take 1 tablet by mouth 2 times daily (before meals) 60 tablet 0    ondansetron (ZOFRAN) 4 MG tablet Take 1 tablet by mouth every 8 hours as needed for Nausea or Vomiting 12 tablet 0    albuterol (ACCUNEB) 0.63 MG/3ML nebulizer solution Inhale 0.63 mg into the lungs every 6 hours as needed         Allergies     Allergies   Allergen Reactions    Hydromorphone Itching       Physical Exam   Patient Vitals for the past 24 hrs:   Temp Pulse Resp BP SpO2   05/24/21 1648 -- 66 16 127/80 99 %   05/24/21  1402 99 F (37.2 C) 81 16 (!) 109/97 --     Physical Exam  Vitals and nursing note reviewed.   Constitutional:       Appearance: She is not toxic-appearing.   HENT:      Head: Normocephalic.      Nose: Nose normal.      Mouth/Throat:      Mouth: Mucous membranes are moist.   Eyes:      Pupils: Pupils are equal, round, and reactive to light.   Cardiovascular:      Rate and Rhythm: Normal rate and regular rhythm.      Pulses: Normal pulses.   Pulmonary:      Effort: Pulmonary effort is normal.      Breath sounds: Normal breath sounds.   Abdominal:      General: Abdomen is protuberant. Bowel sounds are normal.      Palpations: Abdomen is soft.      Tenderness: There is abdominal tenderness in the right upper quadrant, epigastric area and left upper quadrant.   Musculoskeletal:         General: Normal range of motion.      Cervical back: Normal range of motion and neck supple.   Skin:     General: Skin is warm and dry.      Capillary Refill: Capillary refill takes less than 2 seconds.   Neurological:      General: No focal deficit present.      Mental Status: She is alert and oriented to person, place, and time.         Impression and Management Plan   31 year old with chronic abdominal pain acute on chronic pancreatitis, pancreatic pseudocyst presenting with abdominal pain nausea and vomiting typical of prior episodes of pancreatitis.  Differential diagnoses pancreatitis, cannabinoid hyperemesis syndrome, gastritis    Diagnostic Studies     Recent Results (from the past 24 hour(s))   CBC with Diff    Collection Time: 05/24/21  3:45 PM   Result Value Ref Range    WBC  6.7 4.0 - 11.0 1000/mm3    RBC 4.06 3.60 - 5.20 M/uL    Hemoglobin 10.4 (L) 13.0 - 17.2 gm/dl    Hematocrit 83.6 (L) 37.0 - 50.0 %    MCV 72.4 (L) 80.0 - 98.0 fL    MCH 25.6 25.4 - 34.6 pg    MCHC 35.4 30.0 - 36.0 gm/dl    Platelets 629 476 - 450 1000/mm3    MPV 11.1 (H) 6.0 - 10.0 fL    RDW 42.8 36.4 - 46.3      Nucleated RBCs 0 0 - 0      Immature  Granulocytes 0.1 0.0 - 3.0 %    Neutrophils Segmented 58.0 34 - 64 %    Lymphocytes 29.0 28 - 48 %    Monocytes 9.5 1 - 13 %    Eosinophils 3.1 0 - 5 %    Basophils 0.3 0 - 3 %   CMP    Collection Time: 05/24/21  3:45 PM   Result Value Ref Range    Potassium 4.9 3.5 - 5.1 mEq/L    Chloride 110 (H) 98 - 107 mEq/L    Sodium 139 136 - 145 mEq/L    CO2 23 20 - 31 mEq/L    Glucose 93 74 - 106 mg/dl    BUN 6 (L) 9 - 23 mg/dl    Creatinine 5.46 (L) 0.55 - 1.02 mg/dl    GFR African American >60.0      GFR Non-African American >60      Calcium 9.7 8.7 - 10.4 mg/dl    Anion Gap 6 5 - 15 mmol/L    AST 24.0 0.0 - 33.9 U/L    ALT 8 (L) 10 - 49 U/L    Alkaline Phosphatase 50 46 - 116 U/L    Total Bilirubin 0.20 (L) 0.30 - 1.20 mg/dl    Total Protein 7.9 5.7 - 8.2 gm/dl    Albumin 4.0 3.4 - 5.0 gm/dl   Lipase    Collection Time: 05/24/21  3:45 PM   Result Value Ref Range    Lipase 45 12 - 53 U/L   POCT Urinalysis no Micro    Collection Time: 05/24/21  4:40 PM   Result Value Ref Range    Glucose, Ur Negative NEGATIVE,Negative mg/dl    Bilirubin, Urine Negative NEGATIVE,Negative      Ketones, Urine Negative NEGATIVE,Negative mg/dl    Specific Gravity, Urine 1.020 1.005 - 1.030      Blood, Urine Negative NEGATIVE,Negative      pH, Urine 8.5 5 - 9      Protein, Urine Negative NEGATIVE,Negative mg/dl    Urobilinogen, Urine 0.2 0.0 - 1.0 EU/dl    Nitrite, Urine Negative NEGATIVE,Negative      Leukocyte Esterase, Urine Negative NEGATIVE,Negative      Color, UA Yellow      Clarity, UA Clear        No orders to display           Other studies:  My interpretation of other studies is that they show, among other things, no urinary tract infection, lipase of 45, no LFT elevation, anemia consistent with prior baseline and labs drawn yesterday, no severe electrolyte derangement.    ED Course         RECORDS REVIEWED:  I reviewed the patient's previous records here at [facility] and available outside facilities and note that patient been seen in the  emergency department in different locations on April 30,  May 1 May 2 at this facility and [facility], and yesterday for same complaint and presentation.  Chest prescribed Zofran on May 9 of this year as well as Tylenol            Threat to body function without evaluation and management: Yes, pancreatitis, gastrointestinal            Comorbidities impacting Evaluation and Management: Alcohol use disorder, acute on chronic pancreatitis          Medications   ondansetron (ZOFRAN) injection 4 mg (4 mg IntraVENous Given 05/24/21 1559)   oxyCODONE-acetaminophen (PERCOCET) 5-325 MG per tablet 1 tablet (1 tablet Oral Given 05/24/21 1600)   pantoprazole (PROTONIX) 40 mg in sodium chloride (PF) 0.9 % 10 mL injection (40 mg IntraVENous Given 05/24/21 1610)   0.9 % sodium chloride bolus (0 mLs IntraVENous Stopped 05/24/21 1715)   morphine injection 4 mg (4 mg IntraVENous Given 05/24/21 1643)               Medical Decision Making     Dr. Carmela HurtFickenscher took over this patient see his note for MDM.    Final Diagnosis       ICD-10-CM    1. Generalized abdominal pain  R10.84       2. Alcohol-induced chronic pancreatitis (HCC)  K86.0       3. Polysubstance use disorder  F19.90            Disposition   Discharged    The patient was personally evaluated by myself and Dr. Dixie DialsBen Fickenscher, MD who agrees with the above assessment and plan.  Brett CanalesMelissa Amera Banos, PA-C    May 24, 2021    My signature above authenticates this document and my orders, the final    diagnosis (es), discharge prescription (s), and instructions in the Epic    record.  If you have any questions please contact [phone].     Nursing notes have been reviewed by the physician/ advanced practice    Clinician.                             Loi Rennaker V, PA-C  05/24/21 2124

## 2021-05-24 NOTE — Care Coordination-Inpatient (Signed)
Patient identified as an EDIE PATIENT   E.D. Visit Count Total 35 visits/ 12 months   Facility Visits  [facility] 11  [facility] 2  [facility] [address] - Emergency Care 1  Total 35    May 23, 2021 [facility]   ABD PAIN  Personal history of other diseases of the digestive system  Other chronic pain  Unspecified abdominal pain  Nausea with vomiting, unspecified  Cannabis abuse, uncomplicated    May 17, 2021 [facility]  Abdominal Pain  Upper abdominal pain, unspecified    May 17, 2021 [facility]  abd pain  Right upper quadrant pain  Left upper quadrant pain  Cannabis use, unspecified, uncomplicated   Vomiting, unspecified  Procedure and treatment not carried out because of patient's  decision for other reasons   Pseudocyst of pancreas  Alcohol-induced chronic pancreatitis   Acquired absence of other specified parts of digestive tract    May 1,2023 [facility]  ABDOMINAL PAIN  Unspecified abdominal pain  Other chronic pain  Other chronic pancreatitis

## 2021-05-24 NOTE — Discharge Instructions (Signed)
Please follow-up with your primary care doctor as well as your gastroenterologist as soon as possible    Abstain from alcohol and other substance abuse    Follow-up for changing conditions or other concerns

## 2021-05-24 NOTE — Progress Notes (Signed)
Rounded on Pt. I was notified about their concerns. I notified the medical staff. No further needs or concerns at this time.

## 2021-05-24 NOTE — ED Provider Notes (Signed)
I provided a substantive portion of the care of this patient.  I personally performed the MEDICAL DECISION MAKING, in its entirety, for this encounter.    .pa  I interviewed and examined the patient. I discussed with the mid-level provider and agree with their evaluation and plan as documented here.    She has her typical constellation of symptoms related to chronic pancreatitis and gastritis and chronic abdominal pain    All of this is complicated by her polysubstance use disorder    She has received symptomatic care and hydration here    We have stressed the need for abstinence from her substances and for following up with substance abuse counseling providers.  I have provided her with written resources    Since she had a visit yesterday at an outside facility and got prescriptions I do not believe she requires any prescriptions today    I have answered all questions.  I have discussed my recommendations for follow-up and reasons to return.  I have encouraged early return should worrisome signs or symptoms develop or if there is any worsening or unanticipated persistence of their presenting condition.  I have discussed any limitations to their daily activities and the treatment plan for at home.       Christiana Pellant, MD  05/24/21 [phone]

## 2021-05-31 DIAGNOSIS — R109 Unspecified abdominal pain: Secondary | ICD-10-CM

## 2021-05-31 NOTE — ED Triage Notes (Signed)
Pt arrived via EMS from home c/o upper abdominal pain with Nausea and vomiting since yesterday,

## 2021-05-31 NOTE — ED Notes (Signed)
Medicated pt per MAR.      Gladis Riffle, RN  05/31/21 [phone]

## 2021-05-31 NOTE — ED Provider Notes (Incomplete)
[facility]  Emergency Department Treatment Report        Patient: Rhonda Hammond Age: 31 y.o. Sex: female    Date of Birth: 1990-12-14 Admit Date: 05/31/2021 PCP: None None   MRN: [id number]  CSN: [id number]  PRETKO, RACHEL L, DO   Room: H02/H02 Time Dictated: 11:28 PM Rejeana Brock, APRN - NP       Chief Complaint   Chief Complaint   Patient presents with    Abdominal Pain       History of Present Illness   This is a 31 y.o. female with history of alcohol abuse, pancreatitis, cocaine abuse presents to the emergency department for chief complaint of abdominal pain.  Patient reports that this morning while at work she drinks some coffee and then shortly thereafter drink some orange juice.  After drinking the orange juice she began experiencing abdominal pain that similar to her pancreatitis.  She reports that she has been experiencing this pain all day with associated nausea and vomiting.  She reports that despite taking Tylenol, she is not experiencing any relief of her symptoms.    Patient reports that she has a GI appointment on June 22.    Patient states that she does smoke black and milds.  Reports that she has "cut down" on her alcohol intake.  Now drinking 2 glasses of wine daily.  She also reports marijuana use.  Review of Systems   Review of Systems   Constitutional:  Negative for fever.   Respiratory:  Negative for shortness of breath.    Cardiovascular:  Negative for chest pain.   Gastrointestinal:  Positive for abdominal pain, nausea and vomiting.   Genitourinary:  Negative for dysuria.   Neurological:  Negative for headaches.     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     Intractable nausea and vomiting 06/26/2020    Obesity 12/16/2018    Pancreatitis      Past Surgical History:   Procedure  Laterality Date    CESAREAN SECTION      CHOLECYSTECTOMY         Social History     Social History     Socioeconomic History    Marital status: Single     Spouse name: Not on file    Number of children: Not on file    Years of education: Not on file    Highest education level: Not on file   Occupational History    Not on file   Tobacco Use    Smoking status: Every Day     Types: Cigars    Smokeless tobacco: Not on file   Substance and Sexual Activity    Alcohol use: Yes     Alcohol/week: 21.0 standard drinks     Types: 21 Shots of liquor per week     Comment: 2-3 drinks a day, sometimes more    Drug use: Yes     Types: Marijuana Sheran Fava)    Sexual activity: Not on file   Other Topics Concern    Not on file   Social History Narrative    Not on file     Social Determinants of Health     Financial Resource Strain: Not on file   Food Insecurity:  Not on file   Transportation Needs: Not on file   Physical Activity: Not on file   Stress: Not on file   Social Connections: Not on file   Intimate Partner Violence: Not on file   Housing Stability: Not on file      Family History     Family History   Problem Relation Age of Onset    No Known Problems Mother       Current Medications     No current facility-administered medications on file prior to encounter.     Current Outpatient Medications on File Prior to Encounter   Medication Sig Dispense Refill    haloperidol (HALDOL) 5 MG tablet Take 1 tablet by mouth 4 times daily as needed (abdominal pain) 30 tablet 0    dicyclomine (BENTYL) 20 MG tablet Take 1 tablet by mouth 4 times daily as needed (abdominal pain) 30 tablet 0    pantoprazole (PROTONIX) 40 MG tablet Take 1 tablet by mouth 2 times daily (before meals) 60 tablet 0    ondansetron (ZOFRAN) 4 MG tablet Take 1 tablet by mouth every 8 hours as needed for Nausea or Vomiting 12 tablet 0    albuterol (ACCUNEB) 0.63 MG/3ML nebulizer solution Inhale 0.63 mg into the lungs every 6 hours as needed       Allergies     Allergies    Allergen Reactions    Hydromorphone Itching     Physical Exam   Patient Vitals for the past 24 hrs:   Temp Pulse Resp BP SpO2   05/31/21 2020 98.1 F (36.7 C) 87 16 (!) 125/91 98 %       Constitutional: Well developed, well nourished. No acute distress.  Nontoxic-appearing.  EYES: Conjuctivae clear, lids normal. No discharge  NECK: Supple. No midline cervical spinal tenderness with palpation.  RESP: There is faint wheezing and diminished lung sounds to the bilateral lung bases.  No rhonchi, crackles, or rales. No respiratory distress, tachypnea, or accessory muscle use.  CV: Heart: Regular, without murmurs, gallops, rubs, or thrills.   GI:  Abdomen soft, non-distended, nontender to palpation. No guarding. No rebound.  MSK: Moves all 4 extremities spontaneously. She is changing positions without difficulty. Bilateral calves are soft and nontender.  Ambulatory with steady gait.  SKIN: Warm and dry.  Capillary refill less than 2 seconds.  NEURO: Alert, oriented. Sensation intact, motor strength equal and symmetric. Follows commands.    Impression and Management Plan   This is a patient presenting to the emergency department chief complaint of chronic abdominal pain.    Differential diagnoses: pancreatitis, gastritis, dehydration, chronic abdominal pain    Patient's abdominal exam is otherwise unremarkable.  Left low suspicion for acute surgical abdomen as the patient endorses this as her typical pancreatitis pain.  She does not appear toxic.  Pending abdominal labs will determine need for imaging    Diagnostic Studies   Lab:   Recent Results (from the past 24 hour(s))   CBC with Diff    Collection Time: 05/31/21 10:09 PM   Result Value Ref Range    WBC 7.3 4.0 - 11.0 1000/mm3    RBC 3.95 3.60 - 5.20 M/uL    Hemoglobin 9.9 (L) 13.0 - 17.2 gm/dl    Hematocrit 16.1 (L) 37.0 - 50.0 %    MCV 73.2 (L) 80.0 - 98.0 fL    MCH 25.1 (L) 25.4 - 34.6 pg    MCHC 34.3 30.0 - 36.0 gm/dl  Platelets 229 140 - 450 1000/mm3    MPV  10.6 (H) 6.0 - 10.0 fL    RDW 41.6 36.4 - 46.3      Nucleated RBCs 0 0 - 0      Immature Granulocytes 0.3 0.0 - 3.0 %    Neutrophils Segmented 56.6 34 - 64 %    Lymphocytes 31.0 28 - 48 %    Monocytes 8.9 1 - 13 %    Eosinophils 2.9 0 - 5 %    Basophils 0.3 0 - 3 %   CMP    Collection Time: 05/31/21 10:09 PM   Result Value Ref Range    Potassium 3.9 3.5 - 5.1 mEq/L    Chloride 109 (H) 98 - 107 mEq/L    Sodium 140 136 - 145 mEq/L    CO2 24 20 - 31 mEq/L    Glucose 90 74 - 106 mg/dl    BUN 5 (L) 9 - 23 mg/dl    Creatinine 1.61 0.96 - 1.02 mg/dl    GFR African American >60.0      GFR Non-African American >60      Calcium 9.4 8.7 - 10.4 mg/dl    Anion Gap 7 5 - 15 mmol/L    AST 20.0 0.0 - 33.9 U/L    ALT 9 (L) 10 - 49 U/L    Alkaline Phosphatase 51 46 - 116 U/L    Total Bilirubin 0.40 0.30 - 1.20 mg/dl    Total Protein 8.0 5.7 - 8.2 gm/dl    Albumin 4.3 3.4 - 5.0 gm/dl   Lipase    Collection Time: 05/31/21 10:09 PM   Result Value Ref Range    Lipase 38 12 - 53 U/L        Imaging:    No results found.    ED Course   RECORDS REVIEWED/EXTERNAL RESULTS REVIEWED:  I reviewed the patient's previous records here at [facility] and available outside facilities and note that patient has been seen frequently at various emergency departments.  In May alone this is now the patient's ninth ED visit for abdominal pain.    INDEPENDENT HISTORIAN:  History and/or plan development assisted by: self    Severe exacerbation or progression of chronic illness: chronic abdominal pain/pancreatitis    Threat to body function without evaluation and management:  GI    SOCIAL DETERMINANTS impacting Evaluation and Management:     Personal Health Habits and Adaptability    Health services    Comorbidities impacting Evaluation and Management: Chronic abdominal pain/pancreatitis, alcohol use, illicit drug use      Medications   0.9 % sodium chloride bolus (1,000 mLs IntraVENous New Bag 05/31/21 2326)   promethazine (PHENERGAN) 12.5mg  in sodium chloride 0.9% 50  mL IVPB SOLN 12.5 mg (has no administration in time range)   dicyclomine (BENTYL) injection 20 mg (20 mg IntraMUSCular Given 05/31/21 2326)   famotidine (PEPCID) 20 mg in sodium chloride (PF) 0.9 % 10 mL injection (20 mg IntraVENous Given 05/31/21 2326)       Medical Decision Making   NARRATIVE:    I have spoken to *** regarding this patient's care and we discussed ***    I considered the following testing, treatment, or disposition:   *** but decided not to pursue due to ***    I considered prescribing *** and ***     ***       Final Diagnosis     No diagnosis found.  Disposition   DISPOSITION      New Prescriptions    No medications on file           Dalbert Mayotte, FNP-BC  May 31, 2021     The patient was personally evaluated by myself and *** with Dr. Liane Comber, RACHEL L, DO who agrees with the above assessment and plan.    Dragon medical dictation software was used for portions of this report. Unintended errors may occur.     My signature above authenticates this document and my orders, the final diagnosis(es), discharge prescription(s), and instructions in the Epic record.    If you have any questions please contact [phone].     Nursing notes have been reviewed by the physician/advanced practice clinician.

## 2021-06-01 ENCOUNTER — Inpatient Hospital Stay
Admit: 2021-06-01 | Discharge: 2021-06-01 | Disposition: A | Discharge status: Home / Self Care | Payer: PRIVATE HEALTH INSURANCE | Attending: Emergency Medicine

## 2021-06-01 LAB — CBC WITH AUTO DIFFERENTIAL
Basophils: 0.3 % (ref 0–3)
Eosinophils: 2.9 % (ref 0–5)
Hematocrit: 28.9 % — ABNORMAL LOW (ref 37.0–50.0)
Hemoglobin: 9.9 gm/dl — ABNORMAL LOW (ref 13.0–17.2)
Immature Granulocytes: 0.3 % (ref 0.0–3.0)
Lymphocytes: 31 % (ref 28–48)
MCH: 25.1 pg — ABNORMAL LOW (ref 25.4–34.6)
MCHC: 34.3 gm/dl (ref 30.0–36.0)
MCV: 73.2 fL — ABNORMAL LOW (ref 80.0–98.0)
MPV: 10.6 fL — ABNORMAL HIGH (ref 6.0–10.0)
Monocytes: 8.9 % (ref 1–13)
Neutrophils Segmented: 56.6 % (ref 34–64)
Nucleated RBCs: 0 (ref 0–0)
Platelets: 229 10*3/uL (ref 140–450)
RBC: 3.95 M/uL (ref 3.60–5.20)
RDW: 41.6 (ref 36.4–46.3)
WBC: 7.3 10*3/uL (ref 4.0–11.0)

## 2021-06-01 LAB — POCT URINALYSIS DIPSTICK
Glucose, Ur: NEGATIVE mg/dl
Ketones, Urine: NEGATIVE mg/dl
Leukocyte Esterase, Urine: NEGATIVE
Nitrite, Urine: NEGATIVE
Protein, Urine: 30 mg/dl — AB
Specific Gravity, Urine: 1.025 (ref 1.005–1.030)
Urobilinogen, Urine: 1 EU/dl (ref 0.0–1.0)
pH, Urine: 6 (ref 5–9)

## 2021-06-01 LAB — LIPASE: Lipase: 38 U/L (ref 12–53)

## 2021-06-01 LAB — COMPREHENSIVE METABOLIC PANEL
ALT: 9 U/L — ABNORMAL LOW (ref 10–49)
AST: 20 U/L (ref 0.0–33.9)
Albumin: 4.3 gm/dl (ref 3.4–5.0)
Alkaline Phosphatase: 51 U/L (ref 46–116)
Anion Gap: 7 mmol/L (ref 5–15)
BUN: 5 mg/dl — ABNORMAL LOW (ref 9–23)
CO2: 24 mEq/L (ref 20–31)
Calcium: 9.4 mg/dl (ref 8.7–10.4)
Chloride: 109 mEq/L — ABNORMAL HIGH (ref 98–107)
Creatinine: 0.59 mg/dl (ref 0.55–1.02)
GFR African American: >60
GFR Non-African American: >60
Glucose: 90 mg/dl (ref 74–106)
Potassium: 3.9 mEq/L (ref 3.5–5.1)
Sodium: 140 mEq/L (ref 136–145)
Total Bilirubin: 0.4 mg/dl (ref 0.30–1.20)
Total Protein: 8 gm/dl (ref 5.7–8.2)

## 2021-06-01 LAB — POC PREGNANCY UR-QUAL: Pregnancy, Urine: NEGATIVE

## 2021-06-01 MED ORDER — KETOROLAC TROMETHAMINE 15 MG/ML IJ SOLN
15 MG/ML | Freq: Once | INTRAMUSCULAR | Status: AC
Start: 2021-06-01 — End: 2021-06-01
  Administered 2021-06-01: 04:00:00 15 mg via INTRAVENOUS

## 2021-06-01 MED ORDER — SODIUM CHLORIDE 0.9 % IV BOLUS
0.9 % | Freq: Once | INTRAVENOUS | Status: AC
Start: 2021-06-01 — End: 2021-06-01
  Administered 2021-06-01: 03:00:00 1000 mL via INTRAVENOUS

## 2021-06-01 MED ORDER — DICYCLOMINE HCL 10 MG/ML IM SOLN
10 MG/ML | INTRAMUSCULAR | Status: AC
Start: 2021-06-01 — End: 2021-05-31
  Administered 2021-06-01: 03:00:00 20 mg via INTRAMUSCULAR

## 2021-06-01 MED ORDER — HALOPERIDOL LACTATE 5 MG/ML IJ SOLN
5 MG/ML | Freq: Once | INTRAMUSCULAR | Status: AC
Start: 2021-06-01 — End: 2021-06-01
  Administered 2021-06-01: 04:00:00 5 mg via INTRAVENOUS

## 2021-06-01 MED ORDER — SODIUM CHLORIDE (PF) 0.9 % IJ SOLN
0.9 % | INTRAMUSCULAR | Status: AC
Start: 2021-06-01 — End: 2021-05-31
  Administered 2021-06-01: 03:00:00 20 mg via INTRAVENOUS

## 2021-06-01 MED ORDER — PROMETHAZINE (PHENERGAN) 12.5 MG 50 ML IVPB
12.5 mg/50 mL | Freq: Four times a day (QID) | INTRAVENOUS | Status: AC | PRN
Start: 2021-06-01 — End: 2021-06-01

## 2021-06-01 MED ORDER — DICYCLOMINE HCL 20 MG PO TABS
20 MG | ORAL_TABLET | Freq: Four times a day (QID) | ORAL | 0 refills | Status: DC | PRN
Start: 2021-06-01 — End: 2021-12-19

## 2021-06-01 MED ORDER — ONDANSETRON HCL 4 MG PO TABS
4 MG | ORAL_TABLET | Freq: Three times a day (TID) | ORAL | 0 refills | Status: DC | PRN
Start: 2021-06-01 — End: 2022-01-20

## 2021-06-01 MED FILL — DICYCLOMINE HCL 10 MG/ML IM SOLN: 10 MG/ML | INTRAMUSCULAR | Qty: 2

## 2021-06-01 MED FILL — FAMOTIDINE (PF) 20 MG/2ML IV SOLN: 20 MG/2ML | INTRAVENOUS | Qty: 2

## 2021-06-01 MED FILL — KETOROLAC TROMETHAMINE 15 MG/ML IJ SOLN: 15 MG/ML | INTRAMUSCULAR | Qty: 1

## 2021-06-01 MED FILL — PROMETHAZINE (PHENERGAN) 12.5 MG 50 ML IVPB: 12.5 mg/50 mL | INTRAVENOUS | Qty: 50

## 2021-06-01 MED FILL — HALOPERIDOL LACTATE 5 MG/ML IJ SOLN: 5 MG/ML | INTRAMUSCULAR | Qty: 1

## 2021-06-01 NOTE — Discharge Instructions (Addendum)
Please follow up with primary care within 1-2 days for recheck, additional treatment, and follow up referrals as needed. If you do not have one, one was referred to you in your discharge paperwork. If you have any change, worsening, or new concerns, please return to the ER for further evaluation.    If you were referred to a specialist, please call the office to schedule an appointment for evaluation.  Please inform them that you were seen and treated in the emergency department.    Take the Bentyl as needed for your abdominal cramping.    You have been prescribed Zofran to take every 8 hours as needed for nausea and vomiting.  Please allow the medication to dissolve completely in your mouth, and then wait 15 to 20 minutes before eating and drinking.  Please eat and drink small amounts.  Please avoid any foods or drinks that may exacerbate your nausea and vomiting.    Continue drink plenty of water.  Please consume a bland diet until your symptoms improve.  Please abstain from excessive alcohol use and marijuana use.    Return to the emergency department if develop any new or worsening signs and symptoms such as chest pain, fevers, shortness of breath, increasing abdominal pain, persistent vomiting, or other concerning symptoms.

## 2021-06-07 ENCOUNTER — Emergency Department: Admit: 2021-06-07 | Payer: PRIVATE HEALTH INSURANCE

## 2021-06-07 ENCOUNTER — Inpatient Hospital Stay
Admit: 2021-06-07 | Discharge: 2021-06-07 | Disposition: A | Discharge status: Home / Self Care | Payer: PRIVATE HEALTH INSURANCE | Attending: Emergency Medicine

## 2021-06-07 DIAGNOSIS — R1010 Upper abdominal pain, unspecified: Secondary | ICD-10-CM

## 2021-06-07 DIAGNOSIS — R1012 Left upper quadrant pain: Secondary | ICD-10-CM

## 2021-06-07 LAB — EKG 12-LEAD
Atrial Rate: 81 {beats}/min
Calculated P Axis: 28 degrees
Calculated R Axis: 71 degrees
Calculated T Axis: 35 degrees
P-R Interval: 162 ms
Q-T Interval: 378 ms
QRS Duration: 90 ms
QTC Calculation (Bezet): 439 ms
Ventricular Rate: 81 {beats}/min

## 2021-06-07 LAB — COMPREHENSIVE METABOLIC PANEL
ALT: 12 U/L (ref 10–49)
AST: 24 U/L (ref 0.0–33.9)
Albumin: 4.4 gm/dl (ref 3.4–5.0)
Alkaline Phosphatase: 65 U/L (ref 46–116)
Anion Gap: 8 mmol/L (ref 5–15)
BUN: <5 mg/dl — ABNORMAL LOW (ref 9–23)
CO2: 23 mEq/L (ref 20–31)
Calcium: 9.9 mg/dl (ref 8.7–10.4)
Chloride: 107 mEq/L (ref 98–107)
Creatinine: 0.56 mg/dl (ref 0.55–1.02)
GFR African American: >60
GFR Non-African American: >60
Glucose: 100 mg/dl (ref 74–106)
Potassium: 3.8 mEq/L (ref 3.5–5.1)
Sodium: 138 mEq/L (ref 136–145)
Total Bilirubin: 0.8 mg/dl (ref 0.30–1.20)
Total Protein: 8.4 gm/dl — ABNORMAL HIGH (ref 5.7–8.2)

## 2021-06-07 LAB — CBC WITH AUTO DIFFERENTIAL
Basophils: 0.1 % (ref 0–3)
Eosinophils: 0.8 % (ref 0–5)
Hematocrit: 32.5 % — ABNORMAL LOW (ref 35.0–47.0)
Hemoglobin: 11.5 gm/dl (ref 11.0–16.0)
Immature Granulocytes: 0.1 % (ref 0.0–3.0)
Lymphocytes: 22.5 % — ABNORMAL LOW (ref 28–48)
MCH: 25.1 pg — ABNORMAL LOW (ref 25.4–34.6)
MCHC: 35.4 gm/dl (ref 30.0–36.0)
MCV: 71 fL — ABNORMAL LOW (ref 80.0–98.0)
MPV: 10.2 fL — ABNORMAL HIGH (ref 6.0–10.0)
Monocytes: 10.2 % (ref 1–13)
Neutrophils Segmented: 66.3 % — ABNORMAL HIGH (ref 34–64)
Nucleated RBCs: 0 (ref 0–0)
Platelets: 220 10*3/uL (ref 140–450)
RBC: 4.58 M/uL (ref 3.60–5.20)
RDW: 39.8 (ref 36.4–46.3)
WBC: 7.3 10*3/uL (ref 4.0–11.0)

## 2021-06-07 LAB — LIPASE: Lipase: 31 U/L (ref 12–53)

## 2021-06-07 LAB — TROPONIN: Troponin, High Sensitivity: <3 ng/L (ref 0–34)

## 2021-06-07 MED ORDER — ONDANSETRON HCL 4 MG/2ML IJ SOLN
4 MG/2ML | INTRAMUSCULAR | Status: AC
Start: 2021-06-07 — End: 2021-06-07
  Administered 2021-06-07: 17:00:00 4

## 2021-06-07 MED ORDER — SODIUM CHLORIDE 0.9 % IV BOLUS
0.9 % | Freq: Once | INTRAVENOUS | Status: AC
Start: 2021-06-07 — End: 2021-06-07
  Administered 2021-06-07: 19:00:00 1000 mL via INTRAVENOUS

## 2021-06-07 MED ORDER — HYDROMORPHONE HCL 1 MG/ML IJ SOLN
1 MG/ML | INTRAMUSCULAR | Status: AC
Start: 2021-06-07 — End: 2021-06-07
  Administered 2021-06-07: 17:00:00 1 mg via INTRAVENOUS

## 2021-06-07 MED ORDER — ALBUTEROL SULFATE HFA 108 (90 BASE) MCG/ACT IN AERS
108 (90 Base) MCG/ACT | Freq: Four times a day (QID) | RESPIRATORY_TRACT | 3 refills | Status: AC | PRN
Start: 2021-06-07 — End: 2022-04-15

## 2021-06-07 MED FILL — DILAUDID 1 MG/ML IJ SOLN: 1 MG/ML | INTRAMUSCULAR | Qty: 1

## 2021-06-07 MED FILL — ONDANSETRON HCL 4 MG/2ML IJ SOLN: 4 MG/2ML | INTRAMUSCULAR | Qty: 2

## 2021-06-07 NOTE — Progress Notes (Signed)
Medicaid/Dr. Blanchie Serve listed.  Dr. Lynnda Shields is located in [place], [place].  [facility] sent to pt in the mail: a letter, cceva information, list of Medicaid PCP's and addiction treatment information.  LC encouraged pt to call Medicaid to change her PCP.  LC looks forward to working with pt should she reach out for assistance.

## 2021-06-07 NOTE — ED Triage Notes (Signed)
Ab pain UPPER R/L QUADRANT WITH NAUSEA; PT HAS HX OF PANCREATITIS; PT WAS TRIPODING AND SOB AT [facility]; HX OF ASTHMA

## 2021-06-07 NOTE — ED Provider Notes (Signed)
[facility]  Emergency Department Treatment Report        Patient: Rhonda Hammond Age: 31 y.o. Sex: female    Date of Birth: 1990-04-28 Admit Date: 06/07/2021 PCP: None None   MRN: [id number]  CSN: [id number]     Room: ER38/ER38 Time Dictated: 2:58 PM            Chief Complaint   Chief Complaint   Patient presents with    Abdominal Pain       History of Present Illness   This is a 31 y.o. female history of pancreatitis cocaine abuse gastritis presents with abdominal pain and shortness of breath.  Patient states she feels like the asthma was set off by her abdominal pain started acutely shortly prior to arrival.  No chest pain no nausea or vomiting.  No fevers or chills.  Abdominal pain is bilateral upper and similar to prior pain pancreatitis    Review of Systems   Review of Systems   Constitutional:  Negative for activity change.   HENT:  Negative for congestion.    Cardiovascular:  Negative for chest pain.   All other systems reviewed and are negative.      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     Intractable nausea and vomiting 06/26/2020    Obesity 12/16/2018    Pancreatitis      Past Surgical History:   Procedure Laterality Date    CESAREAN SECTION      CHOLECYSTECTOMY         Social History     Social History     Socioeconomic History    Marital status: Single     Spouse name: Not on file    Number of children: Not on file    Years of education: Not on file    Highest education level: Not on file   Occupational History    Not on file   Tobacco Use    Smoking status: Every Day     Types: Cigars    Smokeless tobacco: Not on file   Substance and Sexual Activity    Alcohol use: Yes     Alcohol/week: 21.0 standard drinks     Types: 21 Shots of liquor per week     Comment: 2-3 drinks a day, sometimes more    Drug  use: Yes     Types: Marijuana Sheran Fava)    Sexual activity: Not on file   Other Topics Concern    Not on file   Social History Narrative    Not on file     Social Determinants of Health     Financial Resource Strain: Not on file   Food Insecurity: Not on file   Transportation Needs: Not on file   Physical Activity: Not on file   Stress: Not on file   Social Connections: Not on file   Intimate Partner Violence: Not on file   Housing Stability: Not on file       Family History     Family History   Problem Relation Age of Onset    No Known Problems Mother          Current Facility-Administered Medications   Medication Dose Route Frequency Provider Last Rate Last Admin  0.9 % sodium chloride bolus  1,000 mL IntraVENous Once Algis Downs, MD 495.9 mL/hr at 06/07/21 1435 1,000 mL at 06/07/21 1435     Current Outpatient Medications   Medication Sig Dispense Refill    dicyclomine (BENTYL) 20 MG tablet Take 1 tablet by mouth 4 times daily as needed (abdominal pain) 20 tablet 0    ondansetron (ZOFRAN) 4 MG tablet Take 1 tablet by mouth every 8 hours as needed for Nausea or Vomiting 12 tablet 0    haloperidol (HALDOL) 5 MG tablet Take 1 tablet by mouth 4 times daily as needed (abdominal pain) 30 tablet 0    pantoprazole (PROTONIX) 40 MG tablet Take 1 tablet by mouth 2 times daily (before meals) 60 tablet 0    albuterol (ACCUNEB) 0.63 MG/3ML nebulizer solution Inhale 0.63 mg into the lungs every 6 hours as needed         Allergies   No Known Allergies    Physical Exam   Patient Vitals for the past 24 hrs:   Temp Pulse Resp BP SpO2   06/07/21 1400 -- 78 13 109/76 98 %   06/07/21 1225 98 F (36.7 C) (!) 108 22 (!) 154/99 97 %     Physical Exam  Constitutional:       Appearance: Normal appearance.   HENT:      Head: Normocephalic and atraumatic.      Nose: Nose normal.      Mouth/Throat:      Mouth: Mucous membranes are moist.   Eyes:      Conjunctiva/sclera: Conjunctivae normal.      Pupils: Pupils are equal, round, and  reactive to light.   Cardiovascular:      Rate and Rhythm: Normal rate.   Pulmonary:      Effort: Pulmonary effort is normal.   Abdominal:      General: Abdomen is flat.      Palpations: Abdomen is soft.      Tenderness: There is abdominal tenderness in the right upper quadrant, epigastric area and left upper quadrant.   Musculoskeletal:         General: Normal range of motion.      Cervical back: Normal range of motion and neck supple.   Skin:     General: Skin is warm and dry.      Capillary Refill: Capillary refill takes less than 2 seconds.   Neurological:      General: No focal deficit present.      Mental Status: She is alert.   Psychiatric:         Mood and Affect: Mood normal.         Thought Content: Thought content normal.         Impression and Management Plan   RECORDS REVIEWED:  I reviewed the patient's previous records here at [facility] and available outside facilities and note that patient seen frequently at this facility as well as other facilities for chronic abdominal pain    EXTERNAL RESULTS REVIEWED: Followed outpatient for cholecystectomy    INDEPENDENT HISTORIAN:  History and/or plan development assisted by: Patient    Severe exacerbation or progression of chronic illness: Acute abdominal pain      Threat to body function without evaluation and management: Loss of life or limb      SOCIAL DETERMINANTS  impacting Evaluation and Management:      Comorbidities impacting Evaluation and Management:     Differential diagnoses abdominal pain pancreatitis acute asthma  Initial impression: Presents abdominal pain similar to prior pancreatitis.  Long history of similar.  Check basic labs and reassess    Diagnostic Studies   Lab:   Results for orders placed or performed during the hospital encounter of 06/07/21   CBC with Auto Differential   Result Value Ref Range    WBC 7.3 4.0 - 11.0 1000/mm3    RBC 4.58 3.60 - 5.20 M/uL    Hemoglobin 11.5 11.0 - 16.0 gm/dl    Hematocrit 37.2 (L) 35.0 - 47.0 %    MCV 71.0  (L) 80.0 - 98.0 fL    MCH 25.1 (L) 25.4 - 34.6 pg    MCHC 35.4 30.0 - 36.0 gm/dl    Platelets 902 111 - 450 1000/mm3    MPV 10.2 (H) 6.0 - 10.0 fL    RDW 39.8 36.4 - 46.3      Nucleated RBCs 0 0 - 0      Immature Granulocytes 0.1 0.0 - 3.0 %    Neutrophils Segmented 66.3 (H) 34 - 64 %    Lymphocytes 22.5 (L) 28 - 48 %    Monocytes 10.2 1 - 13 %    Eosinophils 0.8 0 - 5 %    Basophils 0.1 0 - 3 %   CMP   Result Value Ref Range    Potassium 3.8 3.5 - 5.1 mEq/L    Chloride 107 98 - 107 mEq/L    Sodium 138 136 - 145 mEq/L    CO2 23 20 - 31 mEq/L    Glucose 100 74 - 106 mg/dl    BUN <5 (L) 9 - 23 mg/dl    Creatinine 5.52 0.80 - 1.02 mg/dl    GFR African American >60.0      GFR Non-African American >60      Calcium 9.9 8.7 - 10.4 mg/dl    Anion Gap 8 5 - 15 mmol/L    AST 24.0 0.0 - 33.9 U/L    ALT 12 10 - 49 U/L    Alkaline Phosphatase 65 46 - 116 U/L    Total Bilirubin 0.80 0.30 - 1.20 mg/dl    Total Protein 8.4 (H) 5.7 - 8.2 gm/dl    Albumin 4.4 3.4 - 5.0 gm/dl   Lipase   Result Value Ref Range    Lipase 31 12 - 53 U/L   Troponin   Result Value Ref Range    Troponin, High Sensitivity <3 0 - 34 ng/L     Imaging:    XR CHEST (2 VW)   Final Result   IMPRESSION: No acute cardiopulmonary process.      Electronically signed by: Albin Felling, MD 06/07/2021 1:21 PM EDT               EKG: Interpreted by me as sinus at 81 normal axis normal intervals no ST elevation or depression no hypertrophy PVCs    Imaging: Based on my personal interpretation, the x-ray shows no infiltrate    Other studies:  My interpretation of other studies is that they show, among other things, blood work unremarkable mild anemia troponin negative I do not think need to repeat lipase negative    ED Course         Critical Care Time (if necessary)     Medications   0.9 % sodium chloride bolus (1,000 mLs IntraVENous New Bag 06/07/21 1435)   HYDROmorphone (DILAUDID) injection 1 mg (1 mg IntraVENous Given 06/07/21 1305)   ondansetron (ZOFRAN) 4  MG/2ML injection  (4 mg  Given 06/07/21 1305)           NARRATIVE:  Patient presents with abdominal pain similar to prior history does note long history of chronic abdominal pain.  She was given pain medication now feels better will discharge for outpatient follow-up I do not think we need to repeat troponins I do not think this is cardiac in nature EKG was reviewed.  Given the similarity to prior episodes I do not think this is cardiac.      Medical Decision Making     As per narrative    Final Diagnosis       ICD-10-CM    1. Pain of upper abdomen  R10.10               Disposition   dc  Renae FickleMichael Belmont Valli, MD  Jun 07, 2021    My signature above authenticates this document and my orders, the final    diagnosis (es), discharge prescription (s), and instructions in the Epic    record.  If you have any questions please contact [phone]     Nursing notes have been reviewed by the physician/ advanced practice    Clinician.       Algis DownsMichael E Jalee Saine, MD  06/07/21 [phone]

## 2021-09-07 NOTE — ED Notes (Signed)
Formatting of this note might be different from the original.  Vs rechecked, documented on doc flow. Medicated as noted on MAR for 7/10 abd pain. Tolerated well.   Electronically signed by Lynnda Shields, RN at 09/07/2021 10:10 PM EDT

## 2021-09-07 NOTE — ED Provider Notes (Signed)
Formatting of this note is different from the original.    [place]    Time of Arrival:   09/07/21 1413    Final diagnoses:   [K86.1] Other chronic pancreatitis ([place]) (Primary)     Medical Decision Making:      Patient presents here with acute exacerbation of her worsening pancreatitis abdominal pain.  White count normal hemoglobin at baseline no left shift, lipase normal.  Renal function negative.    Given IV fluids, Zofran, Dilaudid    Not suggestive of UTI or signs of pregnancy    Offered CT imaging which she declined  .    Received pain medication, felt better    [facility] exam acceptable    Disposition:  Home    Discharge Medication List as of 09/07/2021 11:44 PM       Chief Complaint   Patient presents with    ABDOMINAL PAIN     Chief complaint: Worsening abdominal pain    31 year old presents here with worsening abdominal pain.  No fevers or chills.  History of chronic pancreatitis, and pseudocysts.  Notes the pain started over the last 12 hours.  Feels similar to prior flares.  States she still passing flatus and having bowel movements.        Review of Systems    Physical Exam  Vitals and nursing note reviewed.   Constitutional:       General: She is not in acute distress.  HENT:      Head: Normocephalic and atraumatic.   Eyes:      General: No scleral icterus.  Cardiovascular:      Rate and Rhythm: Normal rate and regular rhythm.      Heart sounds: Normal heart sounds.   Pulmonary:      Effort: Pulmonary effort is normal. No respiratory distress.      Breath sounds: Normal breath sounds.   Abdominal:      Palpations: Abdomen is soft.      Comments: Tenderness in the epigastric region   Musculoskeletal:         General: No tenderness. Normal range of motion.      Cervical back: Normal range of motion.   Skin:     General: Skin is warm and dry.   Neurological:      Mental Status: She is alert.      Comments: Moving all 4 extremities, no obvious gross motor weakness on the  face     Past Medical History:   Diagnosis Date    Alcohol-induced acute pancreatitis     no longer drinks EtOH regularly    Class 1 obesity with body mass index (BMI) of 32.0 to 32.9 in adult     Gallstones     per pt report     Past Surgical History:   Procedure Laterality Date    LAP CHOLECYSTECTOMY N/A 02/10/2021    Procedure: CHOLECYSTECTOMY, LAPAROSCOPIC, WITH CHOLANGIOGRAM;  Surgeon: Wynelle Fanny, MD     Family History   Problem Relation Age of Onset    No Known Problems Mother     Other Family History Father         died from trauma     Social History     Occupational History    Not on file   Tobacco Use    Smoking status: Every Day     Types: Cigarettes    Smokeless tobacco: Never   Substance and Sexual Activity  Alcohol use: Not on file    Drug use: Not on file    Sexual activity: Not on file     No outpatient medications have been marked as taking for the 09/07/21 encounter [facility] Encounter).     No Known Allergies    Vital Signs:  Patient Vitals for the past 72 hrs:   Temp Heart Rate Resp BP BP Mean SpO2 Weight   09/07/21 2355 98.1 F (36.7 C) 63 16 120/75 90 MM HG 99 % --   09/07/21 2210 98 F (36.7 C) 61 14 145/71 96 MM HG 99 % --   09/07/21 1935 98.2 F (36.8 C) 72 16 138/98 (!) 111 MM HG 98 % --   09/07/21 1600 -- 56 20 138/93 (!) 108 MM HG 100 % --   09/07/21 1429 97.9 F (36.6 C) 68 20 133/86 (!) 102 MM HG 100 % 97.5 kg (215 lb)     Diagnostics:  Labs:    Results for orders placed or performed during the hospital encounter of 93/73/42   BASIC METABOLIC PANEL   Result Value Ref Range    Potassium 4.3 3.5 - 5.5 mmol/L    Sodium 139 133 - 145 mmol/L    Chloride 103 98 - 110 mmol/L    Glucose 93 70 - 99 mg/dL    Calcium 9.2 8.4 - 10.5 mg/dL    BUN 6 6 - 22 mg/dL    Creatinine 0.5 0.5 - 1.2 mg/dL    CO2 23 20 - 32 mmol/L    eGFR >60.0 >60.0 mL/min/1.73 sq.m.    Anion Gap 13.0 3.0 - 15.0 mmol/L   HEPATIC FUNCTION PANEL   Result Value Ref Range    Albumin 4.3 3.5 - 5.0 g/dL    Total Protein 7.4  6.4 - 8.3 g/dL    Globulin 3.1 2.0 - 4.0 g/dL    A/G Ratio 1.4 1.1 - 2.6 ratio    Bilirubin Total 0.3 0.2 - 1.2 mg/dL    Bilirubin Direct <0.2 0.0 - 0.3 mg/dL    SGOT (AST) 18 10 - 37 U/L    Alkaline Phosphatase 61 25 - 115 U/L    SGPT (ALT) 7 5 - 40 U/L   Lipase   Result Value Ref Range    Lipase 22 7 - 60 U/L   Pregnancy, Serum   Result Value Ref Range    PREGNANCY SERUM Negative Negative   CBC WITH DIFFERENTIAL AUTO   Result Value Ref Range    WBC 6.2 4.0 - 11.0 K/uL    RBC 4.21 3.80 - 5.20 M/uL    HGB 10.7 (L) 11.7 - 15.5 g/dL    HCT 30.8 (L) 35.1 - 46.5 %    MCV 73 (L) 80 - 99 fL    MCH 25 (L) 26 - 34 pg    MCHC 35 31 - 36 g/dL    RDW 18.7 (H) 10.0 - 15.5 %    Platelet 262 140 - 440 K/uL    MPV 10.9 9.0 - 13.0 fL    Segmented Neutrophils (Auto) 55 40 - 75 %    Lymphocytes (Auto) 34 20 - 45 %    Monocytes (Auto) 8 3 - 12 %    Eosinophils (Auto) 3 0 - 6 %    Basophils (Auto) 0 0 - 2 %    Absolute Neutrophils (Auto) 3.4 1.8 - 7.7 K/uL    Absolute Lymphocytes (Auto) 2.1 1.0 - 4.8 K/uL    Absolute  Monocytes (Auto) 0.5 0.1 - 1.0 K/uL    Absolute Eosinophils (Auto) 0.2 0.0 - 0.5 K/uL    Absolute Basophils (Auto) 0.0 0.0 - 0.2 K/uL   Urinalysis with Microscopic   Result Value Ref Range    Urine Color Yellow Colorless, Pale Yellow, Light Yellow, Yellow, Dark Yellow, Straw    Urine Clarity Clear Clear, Slightly Cloudy    Urine pH 8.0 5.0 - 8.0 pH    Urine Protein Screen Negative Negative, Trace mg/dL    Urine Glucose Negative Negative mg/dL    Urine Ketones Negative Negative mg/dL    Urine Occult Blood Negative Negative    Urine Specific Gravity 1.014 1.005 - 1.030    Urine Nitrite Negative Negative    Urine Leukocyte Esterase Negative Negative    Urine Bilirubin Negative Negative    Urine Urobilinogen 0.2 <2.0 mg/dL mg/dL    Urine RBC 0-2 Negative, 0-2 /hpf    Urine WBC 3-5 0 - 5 /hpf    Urine Bacteria Present (A) Negative    Squamous Epithelial Cells 6-10 (A) None, 0-2 /hpf    Hyaline Cast 0-2 0 - 2 /lpf   PREGNANCY  URINE (Lab)   Result Value Ref Range    PREGNANCY URINE Negative Negative     ECG:  No results found for this visit on 09/07/21.    Medications ordered/given in the ED  Medications   sodium chloride (normal saline) 0.9% infusion (1,000 mL Intravenous New Bag 09/07/21 2012)   HYDROmorphone (Dilaudid) injection 1 mg (1 mg IV Push Given 09/07/21 2014)   ondansetron (PF) (Zofran) injection 4 mg (4 mg IV Push Given 09/07/21 2014)   HYDROmorphone (Dilaudid) injection 1 mg (1 mg IV Push Given 09/07/21 2211)   diphenhydrAMINE (BenadryL) capsule 25 mg (25 mg Oral Given 09/07/21 2349)       Electronically signed by Derinda Sis, MD at 09/08/2021 12:00 AM EDT

## 2021-09-07 NOTE — ED Notes (Signed)
Formatting of this note is different from the original.     09/07/21 1600   PIV: 09/07/21 1651 20 gauge Antecubital Fossa Right   Placement Date/Time: 09/07/21 1651   Pre-existing LDA: No  Local Anesthetic: None  Catheter Type: Insyte  Needle Gauge: 20 gauge  Location: Antecubital Fossa  Orientation: Right   Site Assessment WDL   Line status Saline lock   Dressing Type Transparent / Semipermeable   Dressing Status WDL   Date on Dressing  09/07/21       Electronically signed by Lonzo Cloud at 09/07/2021  4:53 PM EDT

## 2021-09-07 NOTE — ED Notes (Signed)
Formatting of this note is different from the original.     09/07/21 1935   Vital signs   BP 138/98   BP Mean (!) 111 MM HG   Heart Rate 72   Temp 98.2 F (36.8 C)   Temp Source Oral   Resp 16   SpO2 98 %   Device (Oxygen Therapy) room air       Electronically signed by Kathrene Bongo at 09/07/2021  7:43 PM EDT

## 2021-09-07 NOTE — ED Notes (Signed)
Formatting of this note is different from the original.     09/07/21 1600   Lab Draw   Lab Draw Time 1651   Lab Sent Time 1653   Lab Draw Site Right;Antecubital   Lab Draw Device Angiocatheter   Device Gauge 20   # of Lab Draw Attempts 1 Attempt   Labs Drawn with IV Start Yes   Lab Tubes Pink;Lavender;Blue;Green;SST       Electronically signed by Lonzo Cloud at 09/07/2021  4:53 PM EDT

## 2021-09-07 NOTE — ED Notes (Signed)
Formatting of this note might be different from the original.  Pain assessment on discharge was tolerable.  Condition stable.  Patient discharged to home.  Patient education was completed:  yes  Education taught to:  patient  Teaching method used was discussion and handout.  Understanding of teaching was good.  Patient was discharged ambulatory.  Discharged with self.  Valuables were given to: patient.    Cab form submitted at this time.   Electronically signed by Jacquenette Shone, RN at 09/07/2021 11:56 PM EDT

## 2021-09-07 NOTE — ED Notes (Signed)
Formatting of this note is different from the original.     09/07/21 1429   Vital signs   BP 133/86   BP Mean (!) 102 MM HG   Heart Rate 68   Temp 97.9 F (36.6 C)   Temp Source Tympanic   Resp 20   SpO2 100 %   Device (Oxygen Therapy) room air   Height 5\' 5"  (1.651 m)   Height Method Stated   Weight 97.5 kg (215 lb)   BMI (Calculated) 35.78   Weight Method  Standing weight       Electronically signed by at 09/07/2021  2:31 PM EDT

## 2021-09-07 NOTE — ED Triage Notes (Signed)
Formatting of this note might be different from the original.  Patient arrives to ER via [facility] for abdominal pain and vomiting. Hx of pancreatitis.   Electronically signed by Adalberto Cole, RN at 09/07/2021  2:31 PM EDT

## 2021-09-08 NOTE — ED Notes (Signed)
Formatting of this note is different from the original.     09/08/21 1522   Lab Draw   Lab Draw Time 1522   Lab Sent Time 1522   Lab Draw Site Left;Antecubital   Lab Draw Device Angiocatheter   Device Gauge 20   Lab Tubes Blue;Lavender;Green;SST       Electronically signed by Carlota Raspberry at 09/08/2021  3:22 PM EDT

## 2021-09-08 NOTE — ED Provider Notes (Signed)
Formatting of this note is different from the original.  Images from the original note were not included.  [place]    Time of Arrival:   09/08/21 1431     Final diagnoses:   [R10.9, G89.29] Chronic abdominal pain (Primary)   [R11.2] Nausea and vomiting, unspecified vomiting type   [N30.90] Cystitis     Medical Decision Making:      Differential Diagnosis:   Acute on chronic upper abdominal pain.  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  History of alcohol induced pancreatitis though notes she has discontinued alcohol use.  Her lipase has been normal on multiple ED visits recently.    She was seen in the ED 09/01/2021 for the same, was prescribed a short course of narcotic pain medication and referred to pain management specialist, has not yet contacted pain management to establish care  Was seen yesterday for the same, received 2 doses of Dilaudid, felt better and was discharged home.    We will screen labs, treat symptomatically  Pregnancy test from less than 24 hours ago which was negative  CBC with mild anemia hemoglobin 9.7, no leukocytosis  BMP is unremarkable  LFTs and lipase are normal  Urinalysis appears contaminated, 11-20 WBC, positive bacteria.  Culture was sent.  Patient states she was recently diagnosed with UTI and produces several medications from her purse, none of which are antibiotic's.  We will treat with Keflex.    ED Course as of 09/08/21 1925   Thu Sep 08, 2021   1520 Patient refuses droperidol.  Will order Zofran. [AO]   1308 Reassessed patient who is feeling better after medications.  Discussed findings.  Reassuring work-up here.  I recommend she discontinue  marijuana use as this is likely contributing to her chronic abdominal pain and vomiting.  She adamantly denies that this could possibly be contributory.  I recommend she follow-up with a GI specialist and PCP. [GK]     Return precautions discussed. Patient will return to the emergency department for any new or worsening symptoms.  Follow-up with primary care provider as needed.  Patient agrees with plan, patient stable for discharge.    Documentation/Prior Results Review:  Old medical records, Nursing notes, Previous radiology studies    Imaging Interpreted by me: Not Applicable    No orders to display     Disposition:  Home    .    Discharge Medication List as of 09/08/2021  6:20 PM       START taking these medications    Details   ondansetron (ZOFRAN) 4 mg PO ODT. Take 1 Tab by Mouth Every 8 Hours As Needed for Nausea (PRN FOR NAUSEA AND VOMITING). ALLOW TABLET TO DISSOLVE ON TONGUE., Disp-12 Tab, R-0, Print         Chief Complaint   Patient presents with    ABDOMINAL PAIN    VOMITING     Rhonda Hammond is a 31 y.o. female with history of alcoholic pancreatitis, pancreatic pseudocyst, chronic abdominal pain who presents emergency department for evaluation of abdominal pain and vomiting.  Patient reports pain began again today approximately 3 hours ago.  She reports diffuse upper abdominal pain with associated nausea and vomiting.  She reports 3 episodes of nonbloody emesis today.  Denies associated fever or diarrhea.  She states she has been referred to pain management specialist as well as GI provider and is having difficulty establishing care due to Medicaid.  She states she no longer drinks alcohol but reports marijuana use, last use last night.  She states she was evaluated by her primary care provider yesterday who has referred her to a pain management provider and is expecting a phone call to establish care.  Reports LMP 09/02/2021  Reports history of cholecystectomy    Review of Systems     Physical  Exam  Vitals and nursing note reviewed.   Constitutional:       General: She is not in acute distress.     Appearance: She is well-developed. She is not ill-appearing or toxic-appearing.      Comments: Tearful   HENT:      Head: Normocephalic and atraumatic.   Eyes:      Extraocular Movements: Extraocular movements intact.      Conjunctiva/sclera: Conjunctivae normal.   Cardiovascular:      Rate and Rhythm: Normal rate and regular rhythm.      Pulses: Normal pulses.      Heart sounds: Normal heart sounds.   Pulmonary:      Effort: Pulmonary effort is normal. No respiratory distress.      Breath sounds: Normal breath sounds.   Abdominal:      Palpations: Abdomen is soft.      Tenderness: There is generalized abdominal tenderness. There is no guarding or rebound.        Comments: Diffuse upper abdominal tenderness   Musculoskeletal:      Cervical back: Normal range of motion.   Skin:     General: Skin is warm.      Capillary Refill: Capillary refill takes less than 2 seconds.   Neurological:      Mental Status: She is alert and oriented to person, place, and time.      Comments: Ambulatory with normal gait     Past Medical History:   Diagnosis Date    Alcohol-induced acute pancreatitis     no longer drinks EtOH regularly    Class 1 obesity with body mass index (BMI) of 32.0 to 32.9 in adult     Gallstones     per pt report     Past Surgical History:   Procedure Laterality Date    LAP CHOLECYSTECTOMY N/A 02/10/2021    Procedure: CHOLECYSTECTOMY, LAPAROSCOPIC, WITH CHOLANGIOGRAM;  Surgeon: Wynelle Fanny, MD     Family History   Problem Relation Age of Onset    No Known Problems Mother     Other Family History Father         died from trauma     Social History     Occupational History    Not on file   Tobacco Use    Smoking status: Every Day     Types: Cigarettes    Smokeless tobacco: Never   Substance and Sexual Activity    Alcohol use: Not on file    Drug use: Not on file    Sexual activity: Not on file     Outpatient  Medications Marked as Taking for the 09/08/21 encounter [facility] Encounter)   Medication Sig Dispense Refill    cephALEXin (  KEFLEX) 500 mg PO CAPS Take 1 Cap by Mouth 2 (two) times a day for 5 days. 10 Cap 0    ondansetron (ZOFRAN) 4 mg PO ODT. Take 1 Tab by Mouth Every 8 Hours As Needed for Nausea (PRN FOR NAUSEA AND VOMITING). ALLOW TABLET TO DISSOLVE ON TONGUE. 12 Tab 0     No Known Allergies    Vital Signs:  Patient Vitals for the past 72 hrs:   Temp Heart Rate Pulse Resp BP BP Mean SpO2   09/08/21 1802 97.6 F (36.4 C) 62 -- 16 120/77 91 MM HG 98 %   09/08/21 1756 -- 76 76 16 -- -- --   09/08/21 1452 97.8 F (36.6 C) 76 -- -- (!) 189/118 (!) 142 MM HG --   09/08/21 1438 -- 88 -- 20 -- -- 99 %     Diagnostics:  Labs:    Results for orders placed or performed during the hospital encounter of 72/53/66   BASIC METABOLIC PANEL   Result Value Ref Range    Potassium 4.2 3.5 - 5.5 mmol/L    Sodium 138 133 - 145 mmol/L    Chloride 105 98 - 110 mmol/L    Glucose 93 70 - 99 mg/dL    Calcium 8.9 8.4 - 10.5 mg/dL    BUN 5 (L) 6 - 22 mg/dL    Creatinine 0.5 0.5 - 1.2 mg/dL    CO2 23 20 - 32 mmol/L    eGFR >60.0 >60.0 mL/min/1.73 sq.m.    Anion Gap 10.0 3.0 - 15.0 mmol/L   HEPATIC FUNCTION PANEL   Result Value Ref Range    Albumin 4.2 3.5 - 5.0 g/dL    Total Protein 7.5 6.4 - 8.3 g/dL    Globulin 3.3 2.0 - 4.0 g/dL    A/G Ratio 1.3 1.1 - 2.6 ratio    Bilirubin Total 0.2 0.2 - 1.2 mg/dL    Bilirubin Direct <0.2 0.0 - 0.3 mg/dL    SGOT (AST) 13 10 - 37 U/L    Alkaline Phosphatase 58 25 - 115 U/L    SGPT (ALT) 6 5 - 40 U/L   LIPASE   Result Value Ref Range    Lipase 25 7 - 60 U/L   CBC WITH DIFFERENTIAL AUTO   Result Value Ref Range    WBC 6.1 4.0 - 11.0 K/uL    RBC 3.85 3.80 - 5.20 M/uL    HGB 9.7 (L) 11.7 - 15.5 g/dL    HCT 28.0 (L) 35.1 - 46.5 %    MCV 73 (L) 80 - 99 fL    MCH 25 (L) 26 - 34 pg    MCHC 35 31 - 36 g/dL    RDW 18.5 (H) 10.0 - 15.5 %    Platelet 227 140 - 440 K/uL    MPV 9.9 9.0 - 13.0 fL    Segmented  Neutrophils (Auto) 65 40 - 75 %    Lymphocytes (Auto) 24 20 - 45 %    Monocytes (Auto) 8 3 - 12 %    Eosinophils (Auto) 2 0 - 6 %    Basophils (Auto) 0 0 - 2 %    Absolute Neutrophils (Auto) 3.9 1.8 - 7.7 K/uL    Absolute Lymphocytes (Auto) 1.5 1.0 - 4.8 K/uL    Absolute Monocytes (Auto) 0.5 0.1 - 1.0 K/uL    Absolute Eosinophils (Auto) 0.1 0.0 - 0.5 K/uL    Absolute Basophils (Auto) 0.0 0.0 - 0.2 K/uL  Magnesium, Serum   Result Value Ref Range    Magnesium 1.8 1.6 - 2.5 mg/dL   Urinalysis with Microscopic   Result Value Ref Range    Urine Color Yellow Colorless, Pale Yellow, Light Yellow, Yellow, Dark Yellow, Straw    Urine Clarity Cloudy (A) Clear, Slightly Cloudy    Urine pH 6.5 5.0 - 8.0 pH    Urine Protein Screen Trace Negative, Trace mg/dL    Urine Glucose Negative Negative mg/dL    Urine Ketones Negative Negative mg/dL    Urine Occult Blood Negative Negative    Urine Specific Gravity 1.016 1.005 - 1.030    Urine Nitrite Negative Negative    Urine Leukocyte Esterase Negative Negative    Urine Bilirubin Negative Negative    Urine Urobilinogen 1.0 <2.0 mg/dL mg/dL    Urine RBC 0-2 Negative, 0-2 /hpf    Urine WBC 11-20 (A) 0 - 5 /hpf    Urine Bacteria Present (A) Negative    Squamous Epithelial Cells 11-20 (A) None, 0-2 /hpf    Hyaline Cast 0-2 0 - 2 /lpf     Medications given in the ED  Medications   sodium chloride (normal saline) 0.9% infusion (0 mL Intravenous End Infusion 09/08/21 1800)   ondansetron (PF) (Zofran) injection 4 mg (4 mg Intravenous Given 09/08/21 1540)   famotidine (PF) (Pepcid) injection 20 mg (20 mg Intravenous Given 09/08/21 1540)   HYDROmorphone (Dilaudid) injection 1 mg (1 mg IV Push Given 09/08/21 1533)   HYDROmorphone (Dilaudid) injection 0.5 mg (0.5 mg IV Push Given 09/08/21 1756)       Electronically signed by Marva Panda, MD at 09/09/2021 12:02 AM EDT

## 2021-09-08 NOTE — ED Notes (Signed)
Formatting of this note might be different from the original.  Pain assessment on discharge was 0.  Condition stable.  Patient discharged to home.  Patient education was completed:  yes  Education taught to:  patient  Teaching method used was discussion.  Understanding of teaching was good.  Patient was discharged ambulatory.  Discharged with family.  Valuables were given to: patient.   Electronically signed by Wonda Cerise, RN at 09/08/2021  6:18 PM EDT

## 2021-09-08 NOTE — ED Triage Notes (Signed)
Formatting of this note might be different from the original.  Ambulated to triage with c/o upper abdominal pain, N/V x 3 hours    Endorses hx of pancreatitis   Electronically signed by Raj Janus, RN at 09/08/2021  2:38 PM EDT

## 2021-09-11 NOTE — ED Provider Notes (Signed)
Formatting of this note is different from the original.    [facility] GENERAL EMERGENCY DEPARTMENT    Time of Arrival:   09/11/21 4098    Final diagnoses:   [R10.9, G89.29] Chronic abdominal pain (Primary)   [R11.2] Nausea and vomiting, unspecified vomiting type   [F12.188] Cannabis hyperemesis syndrome concurrent with and due to cannabis abuse [facility])     Medical Decision Making:      Chronic abdominal pain, intractable nausea and vomiting, cannabis hyperemesis    Differential Diagnosis:   Likely cannabis hyperemesis syndrome, consider other acute intra-abdominal issue.    Social Determinants of Health:  social factors reviewed, did not limit treatment                               Supplemental Historians include:  patient    ED Course:     Very comfortable appearing patient in no acute distress, walking around room, vomiting into a bag  Vitals within normal limits except for mild tachycardia  Nonfocal neurological exam  Patient has been seen a significant number of times in this emergency department for similar symptoms, usually receives IV fluids, Zofran, pain medication and feels better and is discharged home  Patient endorses continued cannabis use which is likely causative agent of her intractable abdominal pain and nausea vomiting  We will get basic labs, medicate as above, consider imaging if indicated  Counseled on cannabis cessation.    09/11/2021, 5:33 AM  Patient sleeping, vital stable, no other nausea vomiting, looks much better at this time  Labs showing chronic appearing anemia, BMP, LFTs, lipase within normal limits  UA negative  Urine pregnancy negative  Home with antiemetics.    On discharge, VSS, NAD, afebrile, ambulatory without deficit.  Pt voices understanding and agreement with plan of care and any follow up.        Documentation/Prior Results Review:  Initial ED Provider Note , Old medical records, Nursing notes, Previous radiology studies    Rhythm interpretation from monitor:  N/A    Imaging Interpreted by me: Not Applicable    No orders to display     .     Discussion of Mangement with other Physicians, QHP or Appropriate Source:   None    .    Disposition:  Home    New Prescriptions    No medications on file     Chief Complaint   Patient presents with    ABDOMINAL PAIN     Rhonda Hammond is a 31 y.o. female who presents today with Abdominal pain, nausea, vomiting.  Patient says that she has this frequently.  Patient endorses cannabis use.    Location: diffuse abdomen  Severity: severe  Duration: 1 day    Associated symptoms include none    nothing makes it better, nothing makes it worse    no Smoking  no Alcohol  no Drug use    Past Medical History:  No date: Alcohol-induced acute pancreatitis      Comment:  no longer drinks EtOH regularly  No date: Class 1 obesity with body mass index (BMI) of 32.0 to 32.9   in adult  No date: Gallstones      Comment:  per pt report  Review of patient's family history indicates:  Problem: No Known Problems      Relation: Mother          Age of Onset: (Not Specified)  Problem:  Other Family History      Relation: Father          Age of Onset: (Not Specified)          Comment: died from trauma    Review of Systems    Physical Exam  Vitals and nursing note reviewed.   Constitutional:       General: She is not in acute distress.     Appearance: She is well-developed. She is ill-appearing. She is not diaphoretic.   HENT:      Head: Normocephalic and atraumatic.      Right Ear: External ear normal.      Left Ear: External ear normal.      Nose: Nose normal.      Mouth/Throat:      Pharynx: No oropharyngeal exudate.   Eyes:      General: No scleral icterus.        Right eye: No discharge.         Left eye: No discharge.      Conjunctiva/sclera: Conjunctivae normal.      Pupils: Pupils are equal, round, and reactive to light.   Neck:      Thyroid: No thyromegaly.      Vascular: No JVD.      Trachea: No tracheal deviation.   Cardiovascular:      Rate and  Rhythm: Normal rate and regular rhythm.      Heart sounds: Normal heart sounds. No murmur heard.     No friction rub. No gallop.   Pulmonary:      Effort: Pulmonary effort is normal. No respiratory distress.      Breath sounds: Normal breath sounds. No stridor. No wheezing, rhonchi or rales.   Chest:      Chest wall: No tenderness.   Abdominal:      General: Bowel sounds are normal. There is no distension.      Palpations: Abdomen is soft. There is no mass.      Tenderness: There is abdominal tenderness. There is no guarding or rebound.      Hernia: No hernia is present.   Musculoskeletal:         General: No tenderness or deformity. Normal range of motion.      Cervical back: Normal range of motion and neck supple.      Right lower leg: No edema.      Left lower leg: No edema.   Lymphadenopathy:      Cervical: No cervical adenopathy.   Skin:     General: Skin is warm and dry.      Capillary Refill: Capillary refill takes less than 2 seconds.      Coloration: Skin is not pale.      Findings: No erythema or rash.   Neurological:      General: No focal deficit present.      Mental Status: She is alert and oriented to person, place, and time. Mental status is at baseline.      Cranial Nerves: No cranial nerve deficit.      Sensory: No sensory deficit.      Coordination: Coordination normal.   Psychiatric:         Behavior: Behavior normal.         Thought Content: Thought content normal.         Judgment: Judgment normal.     Past Medical History:   Diagnosis Date    Alcohol-induced acute pancreatitis  no longer drinks EtOH regularly    Class 1 obesity with body mass index (BMI) of 32.0 to 32.9 in adult     Gallstones     per pt report     Past Surgical History:   Procedure Laterality Date    LAP CHOLECYSTECTOMY N/A 02/10/2021    Procedure: CHOLECYSTECTOMY, LAPAROSCOPIC, WITH CHOLANGIOGRAM;  Surgeon: Wandra Scot, MD     Family History   Problem Relation Age of Onset    No Known Problems Mother     Other Family  History Father         died from trauma     Social History     Occupational History    Not on file   Tobacco Use    Smoking status: Every Day     Types: Cigarettes    Smokeless tobacco: Never   Substance and Sexual Activity    Alcohol use: Not on file    Drug use: Not on file    Sexual activity: Not on file     No outpatient medications have been marked as taking for the 09/11/21 encounter [facility] Encounter).     No Known Allergies    Vital Signs:  Patient Vitals for the past 72 hrs:   Temp Heart Rate Resp BP BP Mean SpO2 Weight   09/11/21 0218 98.2 F (36.8 C) 101 20 144/89 (!) 107 MM HG 95 % 95.5 kg (210 lb 9.6 oz)     Diagnostics:  Labs:  No results found for this visit on 09/11/21.  ECG:  No results found for this visit on 09/11/21.    Medications ordered/given in the ED  Medications   sodium chloride (normal saline) 0.9% infusion (has no administration in time range)   ondansetron (PF) (Zofran) injection 4 mg (has no administration in time range)   HYDROmorphone (Dilaudid) injection 1 mg (has no administration in time range)     I discussed the above case with Dr. Rogene Houston and he/she is aware of the patients case including but not limited to history, physical exam findings, lab and imaging results. He/She is in agreement with my treatment plan.      Electronically signed by Wanita Chamberlain, MD at 09/11/2021 10:15 AM EDT

## 2021-09-11 NOTE — ED Notes (Signed)
Formatting of this note might be different from the original.  Pain assessment on discharge was tolerable. Prior to DC patient asleep, and heavily snoring in room.  Condition stable.  Patient discharged to home.  Patient education was completed:  yes  Education taught to:  patient  Teaching method used was discussion and handout.  Understanding of teaching was good.  Patient was discharged ambulatory.  Discharged with self  Valuables were given to: patient.    Aox4. DC paperwork in hands.     Electronically signed by Desiree Lucy, RN at 09/11/2021  5:22 AM EDT

## 2021-09-11 NOTE — ED Notes (Signed)
Formatting of this note is different from the original.     09/11/21 0247   Lab Draw   Lab Draw Time 0247   Lab Sent Time 0249   Lab Draw Site Left;Hand   Lab Draw Device Angiocatheter   Device Gauge 20   # of Lab Draw Attempts 1 Attempt   Labs Drawn with IV Start Yes   Lab Tubes Rainbow       Electronically signed by Pietro Cassis at 09/11/2021  2:58 AM EDT

## 2021-09-11 NOTE — ED Triage Notes (Signed)
Formatting of this note might be different from the original.  Arrived by EMS. C/o abdominal pain and nausea/vomiting.   Symptoms started this evening. Was @ [facility] on 8/24 for similar complaint.     Electronically signed by Ree Edman, RN at 09/11/2021  2:25 AM EDT

## 2021-09-18 NOTE — ED Triage Notes (Signed)
Formatting of this note is different from the original.  Here for pancreatitis flare-up starting yesterday: nausea, vomiting, pain.    Past Medical History:   Diagnosis Date    Alcohol-induced acute pancreatitis     no longer drinks EtOH regularly    Class 1 obesity with body mass index (BMI) of 32.0 to 32.9 in adult     Gallstones     per pt report       Electronically signed by Genia Del, RN at 09/18/2021 11:22 AM EDT

## 2021-09-18 NOTE — ED Notes (Signed)
Formatting of this note might be different from the original.  Pain assessment on discharge was tolerable.  Condition stable.  Patient discharged to home.  Patient education was completed:  yes  Education taught to:  patient.   Teaching method used was discussion and handout.  Understanding of teaching was good.  Patient was discharged ambulatory.  Discharged with self.  Valuables were given to: patient.    Electronically signed by William Dalton, RN at 09/18/2021  3:47 PM EDT

## 2021-09-18 NOTE — ED Notes (Signed)
Formatting of this note might be different from the original.  Patient refused pregnancy test and Bentyl. MD Rice aware and spoke with patient.     Electronically signed by William Dalton, RN at 09/18/2021  3:47 PM EDT

## 2021-09-18 NOTE — ED Notes (Signed)
Formatting of this note might be different from the original.  Urine sample sent.   Electronically signed by William Dalton, RN at 09/18/2021  1:15 PM EDT

## 2021-09-18 NOTE — ED Provider Notes (Signed)
Formatting of this note is different from the original.    [place]    Time of Arrival:   09/18/21 1112    Final diagnoses:   [R10.13] Abdominal pain, epigastric (Primary)     Medical Decision Making:    31 year old female with prior medical history of alcoholic pancreatitis, gallstones, chronic cannabis use, multiple recent ED visits for abdominal pain and vomiting presents to ED with abdominal pain and vomiting.  Differential Diagnosis:   High suspicion for cannabinoid hyperemesis syndrome, however will obtain labs to rule out electrolyte abnormality, AKI, pancreatitis, infection, pregnancy    Social Determinants of Health:  substance use                              Supplemental Historians include:  patient and medical records    ED Course:     ED Course as of 09/18/21 1541   Sun Sep 18, 2021   1327 Patient reports "the same pain and vomiting as usual".  Reports that hot showers are the most helpful thing for pain control, but that Zofran also works, however she has run out of her supply of Zofran. Suspect CHS, however given history will do lab work to rule out acute pancreatitis, other intra-abdominal abnormalities [CR]   2 Bloodwork reassuring regarding pancreatitis, electrolytes, infection, kidney function [CR]   1452 Pt reports additional pain, will give one-time dose of bentyl and discharge with Zofran prescription and follow-up instructions for pain control. Pt cleared for discharge pending pregnancy test [CR]   1540 Pt now expresses desire to go home, declining pregnancy test and bentyl. Pt is stable for discharge, will cancel pending upreg/bentyl and discharge [CR]     Documentation/Prior Results Review:  Old medical records  5 prior ED provider notes from last 30 days for same concern    Rhythm interpretation from monitor: normal sinus rhythm, occasional premature atrial beats    Imaging Interpreted by me: Not Applicable    EKG 20-URKY   Final Result       .      Discussion of Mangement with other Physicians, QHP or Appropriate Source:   None    .    Disposition:  Home    New Prescriptions    ONDANSETRON (ZOFRAN) 4 MG PO ODT.    Take 1 Tab by Mouth Every 8 Hours As Needed for Nausea (PRN FOR NAUSEA AND VOMITING). ALLOW TABLET TO DISSOLVE ON TONGUE.     Chief Complaint   Patient presents with   ? ABDOMINAL PAIN     Patient is a 31 year old female with prior medical history of alcoholic pancreatitis, gallstones, chronic cannabis use, multiple recent ED visits for abdominal pain and vomiting now presenting to the emergency department with abdominal pain and vomiting.  Describes her pain and other symptoms as "the same as usual".  Reports that at home symptoms are helped with hot showers and Zofran, however has run out of her home Zofran.  Seems more amenable to idea that her symptoms are a reaction to heavy cannabis use then at past visits.  Reports that she has a history of "facial twitching" as a reaction to droperidol.  Reports that she has an initial pain management appointment this Thursday. Denies recent fevers, chills, night sweats, diarrhea, paresthesias, chest pain, shortness of breath, dizziness.    ABDOMINAL PAIN      Review of Systems:  All other systems reviewed and  are negative.    Physical Exam  Vitals reviewed.   Constitutional:       Comments: Visibly uncomfortable but in no acute distress   HENT:      Head: Normocephalic and atraumatic.      Right Ear: External ear normal.      Left Ear: External ear normal.      Nose: Nose normal. No congestion or rhinorrhea.      Mouth/Throat:      Mouth: Mucous membranes are moist.      Pharynx: Oropharynx is clear. No oropharyngeal exudate or posterior oropharyngeal erythema.   Eyes:      General: No scleral icterus.     Conjunctiva/sclera: Conjunctivae normal.   Cardiovascular:      Rate and Rhythm: Normal rate and regular rhythm.      Pulses: Normal pulses.      Heart sounds: Normal heart sounds. No murmur heard.      No friction rub. No gallop.   Pulmonary:      Effort: Pulmonary effort is normal. No respiratory distress.      Breath sounds: Normal breath sounds. No stridor. No wheezing, rhonchi or rales.   Chest:      Chest wall: No tenderness.   Abdominal:      General: Bowel sounds are normal.      Palpations: Abdomen is soft.      Tenderness: There is abdominal tenderness (most notable in epigastrium). There is no guarding.   Musculoskeletal:      Cervical back: Normal range of motion. No rigidity.   Skin:     General: Skin is warm and dry.      Coloration: Skin is not jaundiced.   Neurological:      Mental Status: She is alert.     Past Medical History:   Diagnosis Date   ? Alcohol-induced acute pancreatitis     no longer drinks EtOH regularly   ? Class 1 obesity with body mass index (BMI) of 32.0 to 32.9 in adult    ? Gallstones     per pt report     Past Surgical History:   Procedure Laterality Date   ? LAP CHOLECYSTECTOMY N/A 02/10/2021    Procedure: CHOLECYSTECTOMY, LAPAROSCOPIC, WITH CHOLANGIOGRAM;  Surgeon: Wynelle Fanny, MD     Family History   Problem Relation Age of Onset   ? No Known Problems Mother    ? Other Family History Father         died from trauma     Social History     Occupational History   ? Not on file   Tobacco Use   ? Smoking status: Every Day     Types: Cigarettes   ? Smokeless tobacco: Never   Substance and Sexual Activity   ? Alcohol use: Not on file   ? Drug use: Not on file   ? Sexual activity: Not on file     Outpatient Medications Marked as Taking for the 09/18/21 encounter [facility] Encounter)   Medication Sig Dispense Refill   ? ondansetron (ZOFRAN) 4 mg PO ODT. Take 1 Tab by Mouth Every 8 Hours As Needed for Nausea (PRN FOR NAUSEA AND VOMITING). ALLOW TABLET TO DISSOLVE ON TONGUE. 20 Tab 0     No Known Allergies    Vital Signs:  Patient Vitals for the past 72 hrs:   Temp Heart Rate BP BP Mean SpO2 Weight   09/18/21 1533 98.2 F (36.8 C) 67 138/82 (!)  101 MM HG 97 % --   09/18/21 1120 97.5  F (36.4 C) 73 139/77 98 MM HG 99 % 93.9 kg (207 lb)     Diagnostics:  Labs:    Results for orders placed or performed during the hospital encounter of 48/54/62   BASIC METABOLIC PANEL   Result Value Ref Range    Potassium 4.4 3.5 - 5.5 mmol/L    Sodium 142 133 - 145 mmol/L    Chloride 105 98 - 110 mmol/L    Glucose 86 70 - 99 mg/dL    Calcium 9.7 8.4 - 10.5 mg/dL    BUN 6 6 - 22 mg/dL    Creatinine 0.6 0.5 - 1.2 mg/dL    CO2 23 20 - 32 mmol/L    eGFR >60.0 >60.0 mL/min/1.73 sq.m.    Anion Gap 14.0 3.0 - 15.0 mmol/L   HEPATIC FUNCTION PANEL   Result Value Ref Range    Albumin 4.8 3.5 - 5.0 g/dL    Total Protein 8.1 6.4 - 8.3 g/dL    Globulin 3.3 2.0 - 4.0 g/dL    A/G Ratio 1.5 1.1 - 2.6 ratio    Bilirubin Total 0.3 0.2 - 1.2 mg/dL    Bilirubin Direct <0.2 0.0 - 0.3 mg/dL    SGOT (AST) 21 10 - 37 U/L    Alkaline Phosphatase 63 25 - 115 U/L    SGPT (ALT) 8 5 - 40 U/L   LIPASE   Result Value Ref Range    Lipase 52 7 - 60 U/L   CBC WITH DIFFERENTIAL AUTO   Result Value Ref Range    WBC 6.7 4.0 - 11.0 K/uL    RBC 4.27 3.80 - 5.20 M/uL    HGB 10.8 (L) 11.7 - 15.5 g/dL    HCT 31.1 (L) 35.1 - 46.5 %    MCV 73 (L) 80 - 99 fL    MCH 25 (L) 26 - 34 pg    MCHC 35 31 - 36 g/dL    RDW 18.5 (H) 10.0 - 15.5 %    Platelet 229 140 - 440 K/uL    MPV 10.3 9.0 - 13.0 fL    Segmented Neutrophils (Auto) 57 40 - 75 %    Lymphocytes (Auto) 33 20 - 45 %    Monocytes (Auto) 8 3 - 12 %    Eosinophils (Auto) 3 0 - 6 %    Basophils (Auto) 0 0 - 2 %    Absolute Neutrophils (Auto) 3.8 1.8 - 7.7 K/uL    Absolute Lymphocytes (Auto) 2.2 1.0 - 4.8 K/uL    Absolute Monocytes (Auto) 0.5 0.1 - 1.0 K/uL    Absolute Eosinophils (Auto) 0.2 0.0 - 0.5 K/uL    Absolute Basophils (Auto) 0.0 0.0 - 0.2 K/uL   Urinalysis w Micro Reflex Culture    Specimen: Clean Catch Urine   Result Value Ref Range    Source Urine      Urine Color Yellow Colorless, Pale Yellow, Light Yellow, Yellow, Dark Yellow, Straw    Urine Clarity Clear Clear, Slightly Cloudy    Urine pH 8.0  5.0 - 8.0 pH    Urine Protein Screen Negative Negative, Trace mg/dL    Urine Glucose Negative Negative mg/dL    Urine Ketones Negative Negative mg/dL    Urine Occult Blood Negative Negative    Urine Specific Gravity 1.012 1.005 - 1.030    Urine Nitrite Negative Negative    Urine Leukocyte Esterase Negative Negative  Urine Bilirubin Negative Negative    Urine Urobilinogen 0.2 <2.0 mg/dL mg/dL    Urine RBC 0-2 Negative, 0-2 /hpf    Urine WBC 3-5 0 - 5 /hpf    Urine Bacteria Negative Negative    Squamous Epithelial Cells 6-10 (A) None, 0-2 /hpf    Hyaline Cast 0-2 0 - 2 /lpf     ECG:  Results for orders placed or performed during the hospital encounter of 09/18/21   EKG 12-LEAD   Result Value Ref Range Status    Heart Rate 59 bpm Final    RR Interval 1,004 ms Final    Atrial Rate 56 ms Final    P-R Interval 158 ms Final    P Duration 115 ms Final    P Horizontal Axis 24 deg Final    P Front Axis 40 deg Final    Q Onset 501 ms Final    QRSD Interval 79 ms Final    QT Interval 430 ms Final    QTcB 429 ms Final    QTcF 428 ms Final    QRS Horizontal Axis -22 deg Final    QRS Axis 49 deg Final    I-40 Front Axis 43 deg Final    t-40 Horizontal Axis -34 deg Final    T-40 Front Axis 57 deg Final    T Horizontal Axis 45 deg Final    T Wave Axis 36 deg Final    S-T Horizontal Axis 67 deg Final    S-T Front Axis 41 deg Final    Impression - OTHERWISE NORMAL ECG -  Final    Impression   Final     -No significant change since prior tracing  c/w 07/23/21-    Impression SR-Sinus rhythm-normal P axis, V-rate 50-99  Final    Impression   Final     APC-Atrial premature complex-SV complex w/ short R-R interval     Medications ordered/given in the ED  Medications   dicyclomine (BentyL) injection 20 mg (has no administration in time range)   ondansetron (PF) (Zofran) injection 4 mg (4 mg Intravenous Given 09/18/21 1321)   HYDROmorphone (Dilaudid) injection 1 mg (1 mg IV Push Given 09/18/21 1321)   Lactated Ringers (LR) infusion (1,000 mL  Intravenous New Bag 09/18/21 1322)       Electronically signed by Phoebe Sharps, MD at 09/18/2021  4:10 PM EDT

## 2021-09-18 NOTE — ED Provider Notes (Signed)
Formatting of this note is different from the original.  ED Resident Supervision Note    I have seen and evaluated Rhonda Hammond and agree with the provider?s findings (exceptions, if any, noted below).  I reviewed and directed the Emergency Department treatment given to Rhonda Hammond     A/P: Abdominal discomfort and vomiting, multiple frequent visits for same.  Nontoxic-appearing.  Multiple advanced imaging.  Will get screening labs and treat symptomatically.  Consider imaging if indicated.    S:  31 y.o. female  with a chief complaint of ABDOMINAL PAIN    O:  Patient Vitals for the past 72 hrs:   Temp Heart Rate BP BP Mean SpO2 Weight   09/18/21 1533 98.2 F (36.8 C) 67 138/82 (!) 101 MM HG 97 % --   09/18/21 1120 97.5 F (36.4 C) 73 139/77 98 MM HG 99 % 93.9 kg (207 lb)         Electronically signed by Tyrone Schimke, MD at 09/18/2021  5:05 PM EDT

## 2021-09-18 NOTE — ED Notes (Signed)
Formatting of this note might be different from the original.  Patient medicated per MAR. Medications verified and checked.   Patients ID and allergies were verified with patient and cross checked with ID band prior to administration.   Patient verbalized understanding of medications being administered.    States to have 10/10 upper abdominal pain and nausea.   Electronically signed by William Dalton, RN at 09/18/2021  1:27 PM EDT

## 2021-09-20 ENCOUNTER — Inpatient Hospital Stay
Admit: 2021-09-20 | Discharge: 2021-09-20 | Disposition: A | Discharge status: Home / Self Care | Payer: PRIVATE HEALTH INSURANCE | Attending: Emergency Medicine

## 2021-09-20 DIAGNOSIS — K861 Other chronic pancreatitis: Secondary | ICD-10-CM

## 2021-09-20 DIAGNOSIS — R1013 Epigastric pain: Secondary | ICD-10-CM

## 2021-09-20 DIAGNOSIS — K859 Acute pancreatitis without necrosis or infection, unspecified: Secondary | ICD-10-CM

## 2021-09-20 LAB — COMPREHENSIVE METABOLIC PANEL
ALT: 9 U/L — ABNORMAL LOW (ref 10–49)
AST: 19 U/L (ref 0.0–33.9)
Albumin: 4.4 gm/dl (ref 3.4–5.0)
Alkaline Phosphatase: 53 U/L (ref 46–116)
Anion Gap: 8 mmol/L (ref 5–15)
BUN: 6 mg/dl — ABNORMAL LOW (ref 9–23)
CO2: 22 mEq/L (ref 20–31)
Calcium: 9.3 mg/dl (ref 8.7–10.4)
Chloride: 111 mEq/L — ABNORMAL HIGH (ref 98–107)
Creatinine: 0.6 mg/dl (ref 0.55–1.02)
GFR African American: >60
GFR Non-African American: >60
Glucose: 116 mg/dl — ABNORMAL HIGH (ref 74–106)
Potassium: 3.7 mEq/L (ref 3.5–5.1)
Sodium: 141 mEq/L (ref 136–145)
Total Bilirubin: 0.4 mg/dl (ref 0.30–1.20)
Total Protein: 7.6 gm/dl (ref 5.7–8.2)

## 2021-09-20 LAB — POCT URINALYSIS DIPSTICK
Bilirubin, Urine: NEGATIVE
Blood, Urine: NEGATIVE
Glucose, Ur: NEGATIVE mg/dl
Ketones, Urine: 15 mg/dl — AB
Leukocyte Esterase, Urine: NEGATIVE
Nitrite, Urine: NEGATIVE
Protein, Urine: 100 mg/dl — AB
Specific Gravity, Urine: 1.02 (ref 1.005–1.030)
Urobilinogen, Urine: 0.2 EU/dl (ref 0.0–1.0)
pH, Urine: 7 (ref 5–9)

## 2021-09-20 LAB — CBC WITH AUTO DIFFERENTIAL
Basophils: 0.1 % (ref 0–3)
Eosinophils: 0.1 % (ref 0–5)
Hematocrit: 28.6 % — ABNORMAL LOW (ref 35.0–47.0)
Hemoglobin: 9.7 gm/dl — ABNORMAL LOW (ref 11.0–16.0)
Immature Granulocytes: 0.3 % (ref 0.0–3.0)
Lymphocytes: 10.5 % — ABNORMAL LOW (ref 28–48)
MCH: 24.9 pg — ABNORMAL LOW (ref 25.4–34.6)
MCHC: 33.9 gm/dl (ref 30.0–36.0)
MCV: 73.3 fL — ABNORMAL LOW (ref 80.0–98.0)
MPV: 10.3 fL — ABNORMAL HIGH (ref 6.0–10.0)
Monocytes: 4.9 % (ref 1–13)
Neutrophils Segmented: 84.1 % — ABNORMAL HIGH (ref 34–64)
Nucleated RBCs: 0 (ref 0–0)
Platelets: 192 10*3/uL (ref 140–450)
RBC: 3.9 M/uL (ref 3.60–5.20)
RDW: 49.1 — ABNORMAL HIGH (ref 36.4–46.3)
WBC: 6.7 10*3/uL (ref 4.0–11.0)

## 2021-09-20 LAB — POC PREGNANCY UR-QUAL: Pregnancy, Urine: NEGATIVE

## 2021-09-20 LAB — LIPASE: Lipase: 44 U/L (ref 12–53)

## 2021-09-20 MED ORDER — ONDANSETRON HCL 4 MG/2ML IJ SOLN
4 MG/2ML | Freq: Four times a day (QID) | INTRAMUSCULAR | Status: DC | PRN
Start: 2021-09-20 — End: 2021-09-20
  Administered 2021-09-20: 19:00:00 4 mg via INTRAVENOUS

## 2021-09-20 MED ORDER — HYDROMORPHONE HCL 1 MG/ML IJ SOLN
1 MG/ML | Freq: Once | INTRAMUSCULAR | Status: AC
Start: 2021-09-20 — End: 2021-09-20
  Administered 2021-09-20: 21:00:00 1 mg via INTRAVENOUS

## 2021-09-20 MED ORDER — SODIUM CHLORIDE 0.9 % IV BOLUS
0.9 % | Freq: Once | INTRAVENOUS | Status: AC
Start: 2021-09-20 — End: 2021-09-20
  Administered 2021-09-20: 19:00:00 1000 mL via INTRAVENOUS

## 2021-09-20 MED ORDER — HYDROMORPHONE HCL 1 MG/ML IJ SOLN
1 MG/ML | INTRAMUSCULAR | Status: AC
Start: 2021-09-20 — End: 2021-09-20
  Administered 2021-09-20: 19:00:00 1 mg via INTRAVENOUS

## 2021-09-20 MED FILL — SODIUM CHLORIDE 0.9 % IV SOLN: 0.9 % | INTRAVENOUS | Qty: 1000

## 2021-09-20 MED FILL — DILAUDID 1 MG/ML IJ SOLN: 1 MG/ML | INTRAMUSCULAR | Qty: 1

## 2021-09-20 MED FILL — ONDANSETRON HCL 4 MG/2ML IJ SOLN: 4 MG/2ML | INTRAMUSCULAR | Qty: 2

## 2021-09-20 NOTE — ED Provider Notes (Signed)
[facility] Care  Emergency Department Treatment Report        Patient: Rhonda Hammond Age: 31 y.o. Sex: female    Date of Birth: January 07, 1991 Admit Date: 09/20/2021 PCP: None None   MRN: [id number]  CSN: [id number]     Room: 101/EO01 Time Dictated: 4:57 PM            Chief Complaint   Chief Complaint   Patient presents with    Abdominal Pain    Pancreatitis       History of Present Illness   This is a 31 y.o. female medical history of alcohol abuse chronic pancreatitis cocaine abuse gastritis intractable nausea and vomiting who presents with abdominal pain and nausea.  Patient states that this is a flareup of her acute on chronic pancreatitis.  States it is consistent with prior.  States its been occurring recurrently at work and not associated with drinking.  No fevers or chills.  No diarrhea.  No other aggravating or alleviating factors no other associated symptoms    Review of Systems   Review of Systems   Constitutional:  Negative for activity change.   HENT:  Negative for congestion.    Gastrointestinal:  Positive for abdominal pain.   All other systems reviewed and are negative.      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     Intractable nausea and vomiting 06/26/2020    Obesity 12/16/2018    Pancreatitis      Past Surgical History:   Procedure Laterality Date    CESAREAN SECTION      CHOLECYSTECTOMY         Social History     Social History     Socioeconomic History    Marital status: Single     Spouse name: Not on file    Number of children: Not on file    Years of education: Not on file    Highest education level: Not on file   Occupational History    Not on file   Tobacco Use    Smoking status: Every Day     Types: Cigars    Smokeless tobacco: Not on file   Substance and Sexual Activity    Alcohol use: Yes      Alcohol/week: 21.0 standard drinks     Types: 21 Shots of liquor per week     Comment: 2-3 drinks a day, sometimes more    Drug use: Yes     Types: Marijuana Sheran Fava)    Sexual activity: Not on file   Other Topics Concern    Not on file   Social History Narrative    Not on file     Social Determinants of Health     Financial Resource Strain: Not on file   Food Insecurity: Not on file   Transportation Needs: Not on file   Physical Activity: Not on file   Stress: Not on file   Social Connections: Not on file   Intimate Partner Violence: Not on file   Housing Stability: Not on file       Family History     Family History   Problem Relation Age of Onset    No Known Problems Mother  Current Medications     Current Facility-Administered Medications   Medication Dose Route Frequency Provider Last Rate Last Admin    ondansetron (ZOFRAN) injection 4 mg  4 mg IntraVENous Q6H PRN Algis Downs, MD   4 mg at 09/20/21 1439    HYDROmorphone (DILAUDID) injection 1 mg  1 mg IntraVENous Once Algis Downs, MD         Current Outpatient Medications   Medication Sig Dispense Refill    albuterol sulfate HFA (PROVENTIL;VENTOLIN;PROAIR) 108 (90 Base) MCG/ACT inhaler Inhale 2 puffs into the lungs every 6 hours as needed for Wheezing 18 g 3    dicyclomine (BENTYL) 20 MG tablet Take 1 tablet by mouth 4 times daily as needed (abdominal pain) 20 tablet 0    ondansetron (ZOFRAN) 4 MG tablet Take 1 tablet by mouth every 8 hours as needed for Nausea or Vomiting 12 tablet 0    haloperidol (HALDOL) 5 MG tablet Take 1 tablet by mouth 4 times daily as needed (abdominal pain) 30 tablet 0    pantoprazole (PROTONIX) 40 MG tablet Take 1 tablet by mouth 2 times daily (before meals) 60 tablet 0    albuterol (ACCUNEB) 0.63 MG/3ML nebulizer solution Inhale 0.63 mg into the lungs every 6 hours as needed         Allergies   No Known Allergies    Physical Exam   Patient Vitals for the past 24 hrs:   Temp Pulse Resp BP SpO2   09/20/21 1630 98.7 F (37.1  C) 69 17 125/76 98 %   09/20/21 1142 98.9 F (37.2 C) 65 18 112/74 97 %     Physical Exam  Constitutional:       Appearance: Normal appearance.   HENT:      Head: Normocephalic and atraumatic.      Nose: Nose normal.      Mouth/Throat:      Mouth: Mucous membranes are moist.   Eyes:      Conjunctiva/sclera: Conjunctivae normal.      Pupils: Pupils are equal, round, and reactive to light.   Cardiovascular:      Rate and Rhythm: Normal rate.      Heart sounds: Normal heart sounds.   Pulmonary:      Effort: Pulmonary effort is normal.   Abdominal:      Palpations: Abdomen is soft.      Tenderness: There is abdominal tenderness in the epigastric area.   Musculoskeletal:         General: Normal range of motion.      Cervical back: Normal range of motion and neck supple.   Skin:     General: Skin is warm and dry.      Capillary Refill: Capillary refill takes less than 2 seconds.   Neurological:      General: No focal deficit present.      Mental Status: She is alert.   Psychiatric:         Mood and Affect: Mood normal.         Thought Content: Thought content normal.         Impression and Management Plan   RECORDS REVIEWED:  I reviewed the patient's previous records here at [facility] and available outside facilities and note that patient was actually seen earlier today at [facility] by Dr. Rogene Houston  Was seen here by me on May 23 of this year.  Does appear that she has been going to the [facility] for the past  3 months.    EXTERNAL RESULTS REVIEWED: She was seen on September 3 at [facility] also by Dr.  Dareen Piano on 827 by Dr. Rogene Houston on 824 by Dr. Iona Coach.  A 23 by Dr. Leata Mouse 817 by Dr. Markus Jarvis 815 by Dr. Donnie Coffin 8 8 by Dr. Iran Sizer 728 by Dr. Gordy Levan.    INDEPENDENT HISTORIAN:  History and/or plan development assisted by: Patient    Severe exacerbation or progression of chronic illness: Acute abdominal pain      Threat to body function without evaluation and management: Loss of life or limb      SOCIAL DETERMINANTS  impacting Evaluation  and Management:      Comorbidities impacting Evaluation and Management: Call abuse    Differential diagnoses pancreatitis gastritis enteritis cholecystitis cholelithiasis    Initial impression: Presents with acute on chronic pancreatitis.  We will check basic labs treat for pain at this point would hold off on imaging given recurrence and similar to prior    Diagnostic Studies   Lab:   Results for orders placed or performed during the hospital encounter of 09/20/21   CBC with Diff   Result Value Ref Range    WBC 6.7 4.0 - 11.0 1000/mm3    RBC 3.90 3.60 - 5.20 M/uL    Hemoglobin 9.7 (L) 11.0 - 16.0 gm/dl    Hematocrit 67.8 (L) 35.0 - 47.0 %    MCV 73.3 (L) 80.0 - 98.0 fL    MCH 24.9 (L) 25.4 - 34.6 pg    MCHC 33.9 30.0 - 36.0 gm/dl    Platelets 938 101 - 450 1000/mm3    MPV 10.3 (H) 6.0 - 10.0 fL    RDW 49.1 (H) 36.4 - 46.3      Nucleated RBCs 0 0 - 0      Immature Granulocytes 0.3 0.0 - 3.0 %    Neutrophils Segmented 84.1 (H) 34 - 64 %    Lymphocytes 10.5 (L) 28 - 48 %    Monocytes 4.9 1 - 13 %    Eosinophils 0.1 0 - 5 %    Basophils 0.1 0 - 3 %   CMP   Result Value Ref Range    Potassium 3.7 3.5 - 5.1 mEq/L    Chloride 111 (H) 98 - 107 mEq/L    Sodium 141 136 - 145 mEq/L    CO2 22 20 - 31 mEq/L    Glucose 116 (H) 74 - 106 mg/dl    BUN 6 (L) 9 - 23 mg/dl    Creatinine 7.51 0.25 - 1.02 mg/dl    GFR African American >60.0      GFR Non-African American >60      Calcium 9.3 8.7 - 10.4 mg/dl    Anion Gap 8 5 - 15 mmol/L    AST 19.0 0.0 - 33.9 U/L    ALT 9 (L) 10 - 49 U/L    Alkaline Phosphatase 53 46 - 116 U/L    Total Bilirubin 0.40 0.30 - 1.20 mg/dl    Total Protein 7.6 5.7 - 8.2 gm/dl    Albumin 4.4 3.4 - 5.0 gm/dl   Lipase   Result Value Ref Range    Lipase 44 12 - 53 U/L   POCT Urinalysis no Micro   Result Value Ref Range    Glucose, Ur Negative NEGATIVE,Negative mg/dl    Bilirubin, Urine Negative NEGATIVE,Negative      Ketones, Urine 15 (A) NEGATIVE,Negative mg/dl    Specific Gravity, Urine 1.020 1.005 -  1.030       Blood, Urine Negative NEGATIVE,Negative      pH, Urine 7.0 5 - 9      Protein, Urine 100 (A) NEGATIVE,Negative mg/dl    Urobilinogen, Urine 0.2 0.0 - 1.0 EU/dl    Nitrite, Urine Negative NEGATIVE,Negative      Leukocyte Esterase, Urine Negative NEGATIVE,Negative      Color, UA Yellow      Clarity, UA Cloudy     POC Pregnancy Urine Qual   Result Value Ref Range    Pregnancy, Urine negative NEGATIVE,Negative,negative       Imaging:    No orders to display           Imaging: Based on my personal interpretation no imaging    Other studies:  My interpretation of other studies is that they show, among other things,   Chemistry unremarkable for elevated chloride and glucose of no significance.  Labs otherwise unremarkable lipase normal slightly worse than normal anemia.  Hypochromic microcytic.  No need of transfusion shift no bands pregnancy and UA negative.    ED Course         Critical Care Time (if necessary)     Medications   ondansetron (ZOFRAN) injection 4 mg (4 mg IntraVENous Given 09/20/21 1439)   HYDROmorphone (DILAUDID) injection 1 mg (has no administration in time range)   sodium chloride 0.9 % bolus 1,000 mL (1,000 mLs IntraVENous New Bag 09/20/21 1438)   HYDROmorphone (DILAUDID) injection 1 mg (1 mg IntraVENous Given 09/20/21 1439)           NARRATIVE:  Patient presents with acute on chronic abdominal pain will discharge for close follow-up now feeling better tolerating p.o.      Medical Decision Making     Given brief obs period.   Will cancel observation status.    Final Diagnosis       ICD-10-CM    1. Acute on chronic pancreatitis [facility])  K85.90     K86.1               Disposition   dc      Renae Fickle, MD  September 20, 2021    My signature above authenticates this document and my orders, the final    diagnosis (es), discharge prescription (s), and instructions in the Epic    record.  If you have any questions please contact [phone]     Nursing notes have been reviewed by the physician/ advanced practice     Clinician.       Algis Downs, MD  09/20/21 [phone]

## 2021-09-20 NOTE — Progress Notes (Signed)
Pt continuously approaching this Clinical research associate with requests. Pt asking when she will be seen by provider. When will the nurse be in. Pt requesting IV. Pt wants to take shower. Advised pt we do not do pt showers in ED. Pt pointed out we have a shower. Notified RN. Offered hot pack and asked pt if there was anything I can do to make her more comfortable. Pt declined.

## 2021-09-20 NOTE — ED Provider Notes (Signed)
Formatting of this note is different from the original.    [place]    Time of Arrival:   09/20/21 9678    Final diagnoses:   [R10.9, G89.29] Chronic abdominal pain (Primary)     Medical Decision Making:      Differential Diagnosis:       Chronic abdominal pain  Chronic pancreatitis  GERD  Gastritis  Cannabis hyperemesis  Cyclical vomiting    Social Determinants of Health:  substance use, cannot afford recommended evaluation or treatment, and no primary care provider                               Supplemental Historians include:  patient    ED Course:     4:30 AM  Patient here with abdominal pain very well-known to ED.  He CT scans this year most recently on July 28 that showed slight decrease in the size of a pancreatic cyst otherwise no acute findings.    Seems to be consistent with previous presentations we will screen labs and medicate for pain.  She reports that she has pain management appointment this week and has not seen GI due to insurance issues.    Labs over meds that were ordered walked out before you speak with her again.      As of 09/20/2021, 5:27 PM  The differential diagnosis and / or critical care lists were considered including infections, sepsis, severe sepsis, and septic shock and found unlikely unless otherwise documented in the final clinical impression or diagnosis list.     Documentation/Prior Results Review:  Old medical records, Nursing notes, Previous radiology studies    Rhythm interpretation from monitor: N/A    Imaging Interpreted by me: Not Applicable    No orders to display     .     Discussion of Mangement with other Physicians, QHP or Appropriate Source:   None    .    Disposition:  AMA    Discharge Medication List as of 09/20/2021  6:01 AM       Chief Complaint   Patient presents with    ABDOMINAL PAIN     31 year old female presenting to the ED with chief complaint of abdominal pain.  History of chronic abdominal pain due to chronic pancreatitis  also has daily marijuana use.  Seen recently and discharged.  Still having epigastric pain and vomiting.  Reportedly has an appointment with pain management in the next 3 to 4 days on Thursday this week.        Review of Systems:  Constitutional:  Negative for fever and chills.   Respiratory:  Negative for cough.    Gastrointestinal:  Positive for nausea, vomiting and abdominal pain. Negative for diarrhea.   Skin:  Negative for rash.     Physical Exam  Vitals and nursing note reviewed.   Constitutional:       General: She is not in acute distress.     Appearance: Normal appearance. She is not toxic-appearing.   HENT:      Head: Normocephalic and atraumatic.      Mouth/Throat:      Mouth: Mucous membranes are moist.   Cardiovascular:      Rate and Rhythm: Normal rate.   Pulmonary:      Effort: Pulmonary effort is normal. No respiratory distress.      Breath sounds: Normal breath sounds. No wheezing, rhonchi or  rales.   Abdominal:      Palpations: Abdomen is soft.      Tenderness: There is abdominal tenderness. There is no guarding or rebound.      Comments: Soft abdomen with mild epigastric tenderness   Skin:     General: Skin is warm and dry.      Findings: No rash.   Neurological:      Mental Status: She is alert.      Gait: Gait normal.   Psychiatric:         Mood and Affect: Mood normal.     Past Medical History:   Diagnosis Date    Alcohol-induced acute pancreatitis     no longer drinks EtOH regularly    Class 1 obesity with body mass index (BMI) of 32.0 to 32.9 in adult     Gallstones     per pt report     Past Surgical History:   Procedure Laterality Date    LAP CHOLECYSTECTOMY N/A 02/10/2021    Procedure: CHOLECYSTECTOMY, LAPAROSCOPIC, WITH CHOLANGIOGRAM;  Surgeon: Wynelle Fanny, MD     Family History   Problem Relation Age of Onset    No Known Problems Mother     Other Family History Father         died from trauma     Social History     Occupational History    Not on file   Tobacco Use    Smoking status:  Every Day     Types: Cigarettes    Smokeless tobacco: Never   Substance and Sexual Activity    Alcohol use: Not on file    Drug use: Not on file    Sexual activity: Not on file     No outpatient medications have been marked as taking for the 09/20/21 encounter [facility] Encounter).     Allergies   Allergen Reactions    Droperidol neurological reaction     Pt states "face twitching and couldn't stop legs from moving"     Vital Signs:  Patient Vitals for the past 72 hrs:   Temp Heart Rate Resp BP BP Mean SpO2 Weight   09/20/21 0325 97.1 F (36.2 C) 76 16 143/110 (!) 121 MM HG 98 % 95 kg (209 lb 6.4 oz)     Diagnostics:  Labs:    Results for orders placed or performed during the hospital encounter of 10/93/23   BASIC METABOLIC PANEL   Result Value Ref Range    Potassium 3.7 3.5 - 5.5 mmol/L    Sodium 139 133 - 145 mmol/L    Chloride 104 98 - 110 mmol/L    Glucose 86 70 - 99 mg/dL    Calcium 9.5 8.4 - 10.5 mg/dL    BUN 7 6 - 22 mg/dL    Creatinine 0.7 0.5 - 1.2 mg/dL    CO2 21 20 - 32 mmol/L    eGFR >60.0 >60.0 mL/min/1.73 sq.m.    Anion Gap 14.0 3.0 - 15.0 mmol/L   HEPATIC FUNCTION PANEL   Result Value Ref Range    Albumin 4.6 3.5 - 5.0 g/dL    Total Protein 7.4 6.4 - 8.3 g/dL    Globulin 2.8 2.0 - 4.0 g/dL    A/G Ratio 1.6 1.1 - 2.6 ratio    Bilirubin Total 0.4 0.2 - 1.2 mg/dL    Bilirubin Direct <0.2 0.0 - 0.3 mg/dL    SGOT (AST) 19 10 - 37 U/L    Alkaline Phosphatase  60 25 - 115 U/L    SGPT (ALT) 9 5 - 40 U/L   LIPASE   Result Value Ref Range    Lipase 71 (H) 7 - 60 U/L   CBC WITH DIFFERENTIAL AUTO   Result Value Ref Range    WBC 7.2 4.0 - 11.0 K/uL    RBC 3.95 3.80 - 5.20 M/uL    HGB 10.1 (L) 11.7 - 15.5 g/dL    HCT 28.9 (L) 35.1 - 46.5 %    MCV 73 (L) 80 - 99 fL    MCH 26 26 - 34 pg    MCHC 35 31 - 36 g/dL    RDW 18.2 (H) 10.0 - 15.5 %    Platelet 208 140 - 440 K/uL    MPV 10.0 9.0 - 13.0 fL    Segmented Neutrophils (Auto) 48 40 - 75 %    Lymphocytes (Auto) 39 20 - 45 %    Monocytes (Auto) 10 3 - 12 %     Eosinophils (Auto) 3 0 - 6 %    Basophils (Auto) 0 0 - 2 %    Absolute Neutrophils (Auto) 3.4 1.8 - 7.7 K/uL    Absolute Lymphocytes (Auto) 2.8 1.0 - 4.8 K/uL    Absolute Monocytes (Auto) 0.7 0.1 - 1.0 K/uL    Absolute Eosinophils (Auto) 0.2 0.0 - 0.5 K/uL    Absolute Basophils (Auto) 0.0 0.0 - 0.2 K/uL   Urinalysis w Micro Reflex Culture    Specimen: Clean Catch Urine   Result Value Ref Range    Source Urine      Urine Color Yellow Colorless, Pale Yellow, Light Yellow, Yellow, Dark Yellow, Straw    Urine Clarity Clear Clear, Slightly Cloudy    Urine pH 7.0 5.0 - 8.0 pH    Urine Protein Screen Negative Negative, Trace mg/dL    Urine Glucose Negative Negative mg/dL    Urine Ketones Negative Negative mg/dL    Urine Occult Blood Negative Negative    Urine Specific Gravity 1.008 1.005 - 1.030    Urine Nitrite Negative Negative    Urine Leukocyte Esterase Negative Negative    Urine Bilirubin Negative Negative    Urine Urobilinogen 0.2 <2.0 mg/dL mg/dL    Urine RBC 0-2 Negative, 0-2 /hpf    Urine WBC 0-2 0 - 5 /hpf    Urine Bacteria Negative Negative    Squamous Epithelial Cells 3-5 (A) None, 0-2 /hpf    Hyaline Cast 0-2 0 - 2 /lpf   PREGNANCY URINE (Lab)   Result Value Ref Range    PREGNANCY URINE Negative Negative     ECG:  No results found for this visit on 09/20/21.    Medications ordered/given in the ED  Medications   sodium chloride (normal saline) 0.9% infusion (1,000 mL Intravenous New Bag 09/20/21 0412)   diphenhydrAMINE (BenadryL) injection 25 mg (25 mg Intravenous Given 09/20/21 0411)   famotidine (PF) (Pepcid) injection 20 mg (20 mg Intravenous Given 09/20/21 0413)   dicyclomine (BentyL) injection 20 mg (20 mg Intramuscular Given 09/20/21 0414)   haloperidol (HaldoL) injection 5 mg (5 mg Intravenous Given 09/20/21 0445)       Electronically signed by Apgar, Cleda Clarks, DO at 09/21/2021 12:58 PM EDT

## 2021-09-20 NOTE — ED Notes (Signed)
Formatting of this note might be different from the original.  Pt tearful; complaining that none of the medications ordered help her pain; pt consenting to all administrations despite continued complaining; provider made aware of pt's complaints; provider bedside discussing plan of care with pt.   Electronically signed by Arville Care, RN at 09/20/2021  4:38 AM EDT

## 2021-09-20 NOTE — Progress Notes (Signed)
Rounded on pt in triage. Provided triage status update. No concerns or needs expressed at this time.

## 2021-09-20 NOTE — ED Notes (Signed)
Discharge papers given and reviewed with patient.    Questions encouraged    pt had no questions for this nurse    ambulatory out of department.      Marinus Maw, LPN  62/26/33 [id number]

## 2021-09-20 NOTE — ED Notes (Signed)
Medicated patient per MAR.     Drue Flirt, RN  09/20/21 [phone]

## 2021-09-20 NOTE — Progress Notes (Signed)
Rounded on Pt. I was notified about their concerns. I notified the medical staff. No further needs or concerns at this time.

## 2021-09-20 NOTE — ED Notes (Signed)
Formatting of this note might be different from the original.  Pt requested that PIV be removed so she can "go somewhere that will help me." Pt escalating, stomping feet, waving arms and hands, and yelling "yall don't want to help me, you know you won't help me. I am going somewhere that will give me something to help me, you didn't give me dilaudid knowing that's what I need. Thank you so much for all yall didn't do for me."     PIV removed and pt stormed out still tearful and yelling.     RN ensured that pt will not be driving d/t meds given. Pt states she does not have a car and she will "figure it out."     Provider made aware.     Patient left AMA, refused to sign AMA form.  PA [place] notified.,at [phone]  Electronically signed by Drue Stager, RN at 09/20/2021  5:07 AM EDT

## 2021-09-20 NOTE — ED Notes (Signed)
Discharge papers given and reviewed with patient.    Questions encouraged    pt had no questions for this nurse    ambulatory out of department.      Marinus Maw, LPN  11/94/17 4081

## 2021-09-20 NOTE — ED Triage Notes (Signed)
Pt presents to the ED via EMS transport for eval of abdominal pain. Pt was evaluated and d/c'd from this ED earlier today.

## 2021-09-20 NOTE — ED Provider Notes (Signed)
[facility]  Emergency Department Treatment Report        Patient: Rhonda Hammond Age: 31 y.o. Sex: female    Date of Birth: 30-Oct-1990 Admit Date: 09/20/2021 PCP: None None   MRN: [id number]  CSN: [id number]  Attending Loa Socks*    Room: ER45/ER45 Time Dictated: 2:03 AM APP: Willette Brace, FNP-C            Chief Complaint   Chief Complaint   Patient presents with    Abdominal Pain       History of Present Illness   This is a 31 y.o. female with a history of asthma, alcohol abuse chronic pancreatitis who returns to the ER with ongoing abdominal pain, nausea and vomiting.  Patient reports that she was seen here earlier today, received IV fluids, Dilaudid and antiemetics.  She states that she was still having pain when she was discharged and continues to have episodes of vomiting prompting her to come to the ER.  She states that she has vomited 7 times since arrival home.   She states she last drank alcohol 4 days ago  She denies any fevers, recent illness with cough, congestion, chest pain, shortness of breath, diarrhea.     Review of Systems   Review of Systems   Constitutional:  Negative for activity change and fever.   Respiratory:  Negative for chest tightness and shortness of breath.    Cardiovascular:  Negative for chest pain.   Gastrointestinal:  Positive for abdominal pain, nausea and vomiting. Negative for diarrhea.   Genitourinary:  Negative for dysuria and hematuria.   Musculoskeletal:  Negative for back pain and neck pain.   Neurological:  Negative for weakness and numbness.   All other systems reviewed and are negative.        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     Intractable nausea and vomiting 06/26/2020    Obesity 12/16/2018    Pancreatitis      Past Surgical History:   Procedure  Laterality Date    CESAREAN SECTION      CHOLECYSTECTOMY         Social History     Social History     Socioeconomic History    Marital status: Single     Spouse name: Not on file    Number of children: Not on file    Years of education: Not on file    Highest education level: Not on file   Occupational History    Not on file   Tobacco Use    Smoking status: Every Day     Types: Cigars    Smokeless tobacco: Not on file   Substance and Sexual Activity    Alcohol use: Yes     Alcohol/week: 21.0 standard drinks     Types: 21 Shots of liquor per week     Comment: 2-3 drinks a day, sometimes more    Drug use: Yes     Types: Marijuana Sheran Fava)    Sexual activity: Not on file   Other Topics Concern    Not on file   Social History Narrative    Not on file     Social Determinants of Health     Financial Resource Strain: Not on  file   Food Insecurity: Not on file   Transportation Needs: Not on file   Physical Activity: Not on file   Stress: Not on file   Social Connections: Not on file   Intimate Partner Violence: Not on file   Housing Stability: Not on file       Family History     Family History   Problem Relation Age of Onset    No Known Problems Mother         Current Medications     Current Facility-Administered Medications   Medication Dose Route Frequency Provider Last Rate Last Admin    sodium chloride 0.9 % bolus 1,000 mL  1,000 mL IntraVENous Once Apolonio Schneiders, APRN - NP 495.9 mL/hr at 09/21/21 0032 1,000 mL at 09/21/21 0032     Current Outpatient Medications   Medication Sig Dispense Refill    albuterol sulfate HFA (PROVENTIL;VENTOLIN;PROAIR) 108 (90 Base) MCG/ACT inhaler Inhale 2 puffs into the lungs every 6 hours as needed for Wheezing 18 g 3    dicyclomine (BENTYL) 20 MG tablet Take 1 tablet by mouth 4 times daily as needed (abdominal pain) 20 tablet 0    ondansetron (ZOFRAN) 4 MG tablet Take 1 tablet by mouth every 8 hours as needed for Nausea or Vomiting 12 tablet 0    haloperidol (HALDOL) 5 MG tablet Take 1  tablet by mouth 4 times daily as needed (abdominal pain) 30 tablet 0    pantoprazole (PROTONIX) 40 MG tablet Take 1 tablet by mouth 2 times daily (before meals) 60 tablet 0    albuterol (ACCUNEB) 0.63 MG/3ML nebulizer solution Inhale 0.63 mg into the lungs every 6 hours as needed         Allergies   algNo Known Allergies    Physical Exam   Patient Vitals for the past 24 hrs:   Temp Pulse Resp BP SpO2   09/20/21 2112 98.7 F (37.1 C) 80 20 (!) 147/86 100 %     Physical Exam  Constitutional:       Appearance: Normal appearance.   HENT:      Head: Normocephalic and atraumatic.   Cardiovascular:      Rate and Rhythm: Normal rate and regular rhythm.   Pulmonary:      Effort: Pulmonary effort is normal.      Breath sounds: Normal breath sounds.   Abdominal:      General: Abdomen is flat.      Tenderness: There is abdominal tenderness in the right upper quadrant, epigastric area and left upper quadrant.   Musculoskeletal:         General: Normal range of motion.      Cervical back: Normal range of motion and neck supple.   Skin:     General: Skin is warm and dry.      Capillary Refill: Capillary refill takes less than 2 seconds.   Neurological:      Mental Status: She is alert.         Impression and Management Plan   Patient is a 31 year old female who returns to the ER with complaints of ongoing abdominal pain, nausea vomiting.     Patient was seen 8 hours ago, earlier today was evaluated with labs, urinalysis, and symptomatic treatment   Will evaluate with i-STAT 8, symptomatic treatment with IV fluids, antiemetics and analgesics.       Differential diagnoses pancreatitis, gastritis, cholelithiasis      Diagnostic Studies   Lab:  Results for orders placed or performed during the hospital encounter of 09/20/21   CBC with Auto Differential   Result Value Ref Range    WBC 10.6 4.0 - 11.0 1000/mm3    RBC 3.94 3.60 - 5.20 M/uL    Hemoglobin 9.8 (L) 11.0 - 16.0 gm/dl    Hematocrit 81.8 (L) 35.0 - 47.0 %    MCV 73.4 (L) 80.0  - 98.0 fL    MCH 24.9 (L) 25.4 - 34.6 pg    MCHC 33.9 30.0 - 36.0 gm/dl    Platelets 299 371 - 450 1000/mm3    MPV 10.3 (H) 6.0 - 10.0 fL    RDW 48.7 (H) 36.4 - 46.3      Nucleated RBCs 0 0 - 0      Immature Granulocytes 0.4 0.0 - 3.0 %    Neutrophils Segmented 87.9 (H) 34 - 64 %    Lymphocytes 6.6 (L) 28 - 48 %    Monocytes 4.9 1 - 13 %    Eosinophils 0.0 0 - 5 %    Basophils 0.2 0 - 3 %   POC CHEM 8   Result Value Ref Range    Sodium 140 136 - 145 mEq/L    Potassium 3.5 3.5 - 4.9 mEq/L    Chloride 107 98 - 107 mEq/L    Total CO2 21 21 - 32 mmol/L    Glucose 137 (H) 74 - 106 mg/dL    BUN 6 (L) 7 - 25 mg/dl    Creatinine 0.3 (L) 0.6 - 1.3 mg/dl    Hematocrit 37 (L) 38 - 45 %    Hemoglobin 12.6 (L) 13.0 - 17.2 gm/dl    Calcium, Ionized 6.96 4.40 - 5.40 mg/dL         Imaging:    No results found.       Other studies:  My interpretation of other studies is that they show, among other things, patient's labs without leukocytosis, no anemia, no electrolyte abnormality, LFTs not elevated, Lipase 40 which is decreased from previous labs 8 hours ago, lipase was 44      Procedures    ED Course / Medical Decision Making        Patient is overall well-appearing, nontoxic and in no acute distress  She has had no active vomiting while in the ED  Patient appears in no distress when not being examined, has requested  Dilaudid for pain from the primary nurse   Patient evaluated by Dr. Leretha Pol at bedside  Results discussed with patient  Patient made aware that she will not be receiving any narcotic medication  Patient reported that she is ready to go, requesting a cab to be called  Patient instructed to follow-up with her primary care doctor  Prescription for Zofran given  Instructions to return to the ER for any worsening symptoms, new symptoms develop or any concerns                RECORDS REVIEWED:  I reviewed the patient's previous records here at [facility] and available outside facilities and note that patient was seen here earlier  today, labs without leukocytosis, no anemia, no electrolyte abnormality, LFTs not elevated, lipase 44, urinalysis negative for infection and pregnancy  Patient received Dilaudid, Zofran and IV fluids    She was seen at [facility] early this morning at 3 AM, also had labs completed, lipase 71, no electrolyte abnormality  Was treated symptomatically with Bentyl, Benadryl, Pepcid,  Haldol.  Patient was upset with staff, states none of the medications helped her pain, and she left AMA      PMP reviewed, patient received 28 tablets of tramadol on 09/09/2021  20 tablets of percocet on 09/01/2021, 10 tablets of Percocet on 8/92023, 18 tablets of Percocet, 08/12/2021,         EXTERNAL RESULTS REVIEWED: Patient has has multiple episodes for similar. 9/3, 8/27, 8/24, 8/23, 8/17, 8/15, 8/8, 7/28 at [facility]           INDEPENDENT HISTORIAN:  History and/or plan development assisted by: patient     Severe exacerbation or progression of chronic illness: abd pain       Threat to body function without evaluation and management: Gastrointestinal      SOCIAL DETERMINANTS  impacting Evaluation and Management: Personal health habits, social environments          Medications   sodium chloride 0.9 % bolus 1,000 mL (1,000 mLs IntraVENous New Bag 09/21/21 0032)   ondansetron (ZOFRAN) injection 4 mg (4 mg IntraVENous Given 09/21/21 0032)   famotidine (PEPCID) 20 mg in sodium chloride (PF) 0.9 % 10 mL injection (20 mg IntraVENous Given 09/21/21 0128)   dicyclomine (BENTYL) capsule 20 mg (20 mg Oral Given 09/21/21 0127)           Final Diagnosis       ICD-10-CM    1. Epigastric pain  R10.13       2. Hx of pancreatitis  Z87.19                Disposition       Patient was discharged home in stable condition with discharge instructions on the same.   Return for persistent bleeding, purulent drainage, symptoms worsen or any other concerns  Return to the ER if condition worsens or new symptoms develop.   Follow up with primary care as  discussed.     Ernst Spell, NP   September 21, 2021            The patient was personally evaluated by myself and evaluated by Loa Socks* who agrees with the above assessment and plan.    My signature above authenticates this document and my orders, the final    diagnosis (es), discharge prescription (s), and instructions in the Epic    record.  If you have any questions please contact [phone].     Nursing notes have been reviewed by the physician/ advanced practice    Clinician.                            Apolonio Schneiders, APRN - NP  09/21/21 [phone]

## 2021-09-20 NOTE — ED Triage Notes (Signed)
Formatting of this note might be different from the original.  Pt arrives via rescue with abd pain since yesterday,  tearful in triage,   Electronically signed by Edmund Hilda, RN at 09/20/2021  3:25 AM EDT

## 2021-09-20 NOTE — ED Triage Notes (Signed)
Patient via EMS from home, abdominal pain. Hx pancreatitis, seen at [facility] yesterday for same. Ambulatory

## 2021-09-21 ENCOUNTER — Inpatient Hospital Stay
Admit: 2021-09-21 | Discharge: 2021-09-21 | Disposition: A | Discharge status: Home / Self Care | Payer: PRIVATE HEALTH INSURANCE | Attending: Emergency Medicine

## 2021-09-21 LAB — COMPREHENSIVE METABOLIC PANEL
ALT: 9 U/L — ABNORMAL LOW (ref 10–49)
AST: 19 U/L (ref 0.0–33.9)
Albumin: 4.4 gm/dl (ref 3.4–5.0)
Alkaline Phosphatase: 53 U/L (ref 46–116)
Anion Gap: 10 mmol/L (ref 5–15)
BUN: 6 mg/dl — ABNORMAL LOW (ref 9–23)
CO2: 20 mEq/L (ref 20–31)
Calcium: 8.9 mg/dl (ref 8.7–10.4)
Chloride: 111 mEq/L — ABNORMAL HIGH (ref 98–107)
Creatinine: 0.54 mg/dl — ABNORMAL LOW (ref 0.55–1.02)
GFR African American: >60
GFR Non-African American: >60
Glucose: 122 mg/dl — ABNORMAL HIGH (ref 74–106)
Potassium: 3.8 mEq/L (ref 3.5–5.1)
Sodium: 141 mEq/L (ref 136–145)
Total Bilirubin: 0.5 mg/dl (ref 0.30–1.20)
Total Protein: 7.7 gm/dl (ref 5.7–8.2)

## 2021-09-21 LAB — POC CHEM 8
BUN: 6 mg/dl — ABNORMAL LOW (ref 7–25)
Calcium, Ionized: 4.8 mg/dL (ref 4.40–5.40)
Chloride: 107 mEq/L (ref 98–107)
Creatinine: 0.3 mg/dl — ABNORMAL LOW (ref 0.6–1.3)
Glucose: 137 mg/dL — ABNORMAL HIGH (ref 74–106)
Hematocrit: 37 % — ABNORMAL LOW (ref 38–45)
Hemoglobin: 12.6 gm/dl — ABNORMAL LOW (ref 13.0–17.2)
Potassium: 3.5 mEq/L (ref 3.5–4.9)
Sodium: 140 mEq/L (ref 136–145)
Total CO2: 21 mmol/L (ref 21–32)

## 2021-09-21 LAB — CBC WITH AUTO DIFFERENTIAL
Basophils: 0.2 % (ref 0–3)
Eosinophils: 0 % (ref 0–5)
Hematocrit: 28.9 % — ABNORMAL LOW (ref 35.0–47.0)
Hemoglobin: 9.8 gm/dl — ABNORMAL LOW (ref 11.0–16.0)
Immature Granulocytes: 0.4 % (ref 0.0–3.0)
Lymphocytes: 6.6 % — ABNORMAL LOW (ref 28–48)
MCH: 24.9 pg — ABNORMAL LOW (ref 25.4–34.6)
MCHC: 33.9 gm/dl (ref 30.0–36.0)
MCV: 73.4 fL — ABNORMAL LOW (ref 80.0–98.0)
MPV: 10.3 fL — ABNORMAL HIGH (ref 6.0–10.0)
Monocytes: 4.9 % (ref 1–13)
Neutrophils Segmented: 87.9 % — ABNORMAL HIGH (ref 34–64)
Nucleated RBCs: 0 (ref 0–0)
Platelets: 194 10*3/uL (ref 140–450)
RBC: 3.94 M/uL (ref 3.60–5.20)
RDW: 48.7 — ABNORMAL HIGH (ref 36.4–46.3)
WBC: 10.6 10*3/uL (ref 4.0–11.0)

## 2021-09-21 LAB — LIPASE: Lipase: 40 U/L (ref 12–53)

## 2021-09-21 MED ORDER — SODIUM CHLORIDE (PF) 0.9 % IJ SOLN
0.9 % | INTRAMUSCULAR | Status: AC
Start: 2021-09-21 — End: 2021-09-21
  Administered 2021-09-21: 05:00:00 20 mg via INTRAVENOUS

## 2021-09-21 MED ORDER — DICYCLOMINE HCL 10 MG PO CAPS
10 MG | ORAL | Status: AC
Start: 2021-09-21 — End: 2021-09-21
  Administered 2021-09-21: 05:00:00 20 mg via ORAL

## 2021-09-21 MED ORDER — ONDANSETRON HCL 4 MG/2ML IJ SOLN
4 MG/2ML | Freq: Once | INTRAMUSCULAR | Status: AC
Start: 2021-09-21 — End: 2021-09-21
  Administered 2021-09-21: 05:00:00 4 mg via INTRAVENOUS

## 2021-09-21 MED ORDER — SODIUM CHLORIDE 0.9 % IV BOLUS
0.9 % | Freq: Once | INTRAVENOUS | Status: AC
Start: 2021-09-21 — End: 2021-09-21
  Administered 2021-09-21: 05:00:00 1000 mL via INTRAVENOUS

## 2021-09-21 MED ORDER — ONDANSETRON HCL 4 MG PO TABS
4 MG | ORAL_TABLET | Freq: Three times a day (TID) | ORAL | 0 refills | Status: DC | PRN
Start: 2021-09-21 — End: 2022-01-20

## 2021-09-21 MED FILL — DICYCLOMINE HCL 10 MG PO CAPS: 10 MG | ORAL | Qty: 2

## 2021-09-21 MED FILL — ONDANSETRON HCL 4 MG/2ML IJ SOLN: 4 MG/2ML | INTRAMUSCULAR | Qty: 2

## 2021-09-21 MED FILL — FAMOTIDINE (PF) 20 MG/2ML IV SOLN: 20 MG/2ML | INTRAVENOUS | Qty: 2

## 2021-09-21 NOTE — ED Triage Notes (Signed)
Formatting of this note might be different from the original.  Pt reports RUQ abdominal pain for 3 days. Was seen yesterday for same. Reports nausea and vomiting. Denies diarrhea       Electronically signed by Lina Sayre, RN at 09/21/2021  7:39 AM EDT

## 2021-09-21 NOTE — ED Notes (Signed)
Discharge instructions reviewed with pt. Pt verbalized understanding.       Earl Lites, RN  09/21/21 [phone]

## 2021-09-21 NOTE — Discharge Instructions (Signed)
Follow-up bland diet, avoid anything spicy, acidic and fried  Encourage fluid intake to maintain hydration  Prescription for Zofran as needed for nausea and vomiting  Follow-up with primary care doctor  Return to the ER for any worsening symptoms, new symptoms develop or any concern

## 2021-09-21 NOTE — ED Notes (Signed)
Pt can be heard making loud noises & heaving loudly  Pt verbalizes vomiting pills 10 minutes after consumption though bright blue pills not visualized in emesis bag when pt states this  Pt walks out of room & demonstrates no distress asking this RN for more pain medication since she threw up the pills & asking to see the doctor.     Earl Lites, RN  09/21/21 [phone]

## 2021-09-21 NOTE — ED Notes (Signed)
Formatting of this note might be different from the original.  Pain assessment on discharge was tolerable.  Condition stable.  Patient discharged to home.  Patient education was completed:  yes  Education taught to:  patient  Teaching method used was discussion.  Understanding of teaching was good.  Patient was discharged ambulatory.  Discharged with self.  Valuables were given to: patient.   Electronically signed by Enzo Bi, RN at 09/21/2021 11:50 AM EDT

## 2021-09-21 NOTE — Progress Notes (Signed)
Formatting of this note might be different from the original.  Urine collected and sent to lab at 0747  Electronically signed by Gary Fleet at 09/21/2021  7:48 AM EDT

## 2021-09-21 NOTE — ED Provider Notes (Signed)
Formatting of this note is different from the original.  Images from the original note were not included.    [name]    Time of Arrival:   09/21/21 8916    Final diagnoses:   [R10.10] Pain of upper abdomen (Primary)   [R10.9, G89.29] Chronic abdominal pain   [F12.90] Cannabis use disorder   [Z90.49] History of cholecystectomy     Medical Decision Making:      Differential Diagnosis:       Differential Diagnosis: Gastritis/PUD/pancreatitis/Hepatitis/UTI/Diverticulitis/Kidney stone/Appendicitis//Obstruction/    Social Determinants of Health:  substance use and no primary care provider                               Supplemental Historians include:  patient and medical records    ED Course:     Extensive review of old records, laboratory work done during her 3 ED visits in the last day, provider notes from those visits  Review of prior imaging including the 8 CT she has had done within the last year  Patient is very upset that she is not receiving the Dilaudid that she states usually takes care of her pain  Discussed with her that chronic recurrent narcotic use for management of chronic abdominal pain is not in her best interest  Reviewed her PMP    She had been written for IV fluids, Toradol and Zofran by provider prior to my arrival in the ED today  Glucose 139  Creatinine 0.4  CO2 19  Potassium 3.5    9:15 AM notified by nursing that she was complaining of additional pain: Pepcid and Tylenol ordered  10:10 AM: Stopped by patient in the hallway complaining of pain.  She had not yet received the above ordered medications.    11:34 AM patient told nursing that she was ready to be discharged home  I spoke with her at length about the importance of continuing to abstain from marijuana use, GI follow-up, pain management follow-up and PCP follow-up as her abdominal pain has become quite debilitating without clear identifiable pathology that I have a treatable solution 2.    Advanced  imaging was considered but based on her current clinical exam, recent laboratory results, afebrile without leukocytosis and prior CTs without identifiable pathology do not believe indicated.      Documentation/Prior Results Review:  Old medical records, Nursing notes, Previous radiology studies, Care Everywhere record    Rhythm interpretation from monitor: N/A    Imaging Interpreted by me:     No orders to display     .     Discussion of Mangement with other Physicians, QHP or Appropriate Source:   None    .    Disposition:  Home    New Prescriptions    No medications on file     Chief Complaint   Patient presents with    ABDOMINAL PAIN    NAUSEA     Rhonda Hammond is a 31 y.o. female with history of chronic abdominal pain, alcoholic pancreatitis, cholecystectomy who presents complaining of right upper quadrant abdominal pain.  She reports constant abdominal pain and multiple episodes of vomiting.  Patient has had innumerable ED visits for same with 12 ED visits in the last 30 days and now her fourth ED visit in the last 2 days.  She was seen yesterday here at 4 AM and left AMA when she did not receive Dilaudid,  she was then seen yesterday at 3 PM at [facility] and again early this morning around 1 AM at [facility].  She had extensive laboratory work including CBC, CMP, and lipase HSP general this morning around 1 AM which were normal.  Her pregnancy test and UA from yesterday were normal.  She reports pain management appointment is scheduled for tomorrow.  She reports that she no longer is using alcohol, last use marijuana 5 days ago.        Review of Systems    Physical Exam  Nursing note reviewed.   Constitutional:       Comments: Pacing around the room   HENT:      Head: Atraumatic.      Right Ear: External ear normal.      Left Ear: External ear normal.      Nose: Nose normal.      Mouth/Throat:      Mouth: Mucous membranes are moist.   Eyes:      Conjunctiva/sclera: Conjunctivae normal.    Cardiovascular:      Rate and Rhythm: Normal rate and regular rhythm.   Pulmonary:      Effort: Pulmonary effort is normal.      Breath sounds: Normal breath sounds.   Abdominal:      General: There is no distension.      Tenderness: There is abdominal tenderness in the epigastric area. There is no right CVA tenderness, left CVA tenderness, guarding or rebound. Negative signs include Murphy's sign and McBurney's sign.   Musculoskeletal:         General: No deformity. Normal range of motion.      Cervical back: Normal range of motion.   Skin:     General: Skin is warm and dry.      Coloration: Skin is not jaundiced.   Neurological:      Mental Status: She is alert.      Cranial Nerves: No cranial nerve deficit.      Motor: Motor function is intact.   Psychiatric:         Mood and Affect: Mood normal.     Past Medical History:   Diagnosis Date    Alcohol-induced acute pancreatitis     no longer drinks EtOH regularly    Class 1 obesity with body mass index (BMI) of 32.0 to 32.9 in adult     Gallstones     per pt report     Past Surgical History:   Procedure Laterality Date    LAP CHOLECYSTECTOMY N/A 02/10/2021    Procedure: CHOLECYSTECTOMY, LAPAROSCOPIC, WITH CHOLANGIOGRAM;  Surgeon: Wynelle Fanny, MD     Family History   Problem Relation Age of Onset    No Known Problems Mother     Other Family History Father         died from trauma     Social History     Occupational History    Not on file   Tobacco Use    Smoking status: Every Day     Types: Cigarettes    Smokeless tobacco: Never   Substance and Sexual Activity    Alcohol use: Not on file    Drug use: Not on file    Sexual activity: Not on file     No outpatient medications have been marked as taking for the 09/21/21 encounter [facility] Encounter).     Allergies   Allergen Reactions    Droperidol neurological reaction  Pt states "face twitching and couldn't stop legs from moving"     Vital Signs:  Patient Vitals for the past 72 hrs:   Temp Pulse Resp BP BP Mean  SpO2 Weight   09/21/21 0737 98.8 F (37.1 C) 95 16 164/91 (!) 115 MM HG 98 % 93.4 kg (205 lb 14.6 oz)     Diagnostics:  Labs:    Results for orders placed or performed during the hospital encounter of 09/21/21   Chem8, i-STAT (Lab)   Result Value Ref Range    POTASSIUM 3.5 3.5 - 5.5 mmol/L    CHLORIDE 108 98 - 110 mmol/L    CALCIUM IONIZED 4.9 4.4 - 5.4 mg/dL    CO2 19.0 (L) 20.0 - 32.0 mmol/L    Glucose 139 (H) 70 - 99 mg/dL    BUN 6 6 - 22 mg/dL    CREATININE 0.4 (L) 0.5 - 1.2 mg/dL    SODIUM 140 133 - 145 mmol/L    HGB 12.6 11.7 - 15.5 g/dL    HCT 37.0 35.1 - 46.5 %    POC-eGFR >60.0 >60.0    Cartridge type CHEM8+      ECG:  No results found for this visit on 09/21/21.    Medications ordered/given in the ED  Medications   sodium chloride (normal saline) 0.9% infusion (1,000 mL Intravenous New Bag 09/21/21 0831)   ketorolac (ToradoL) injection 15 mg (15 mg Intravenous Given 09/21/21 0831)   ondansetron (PF) (Zofran) injection 4 mg (4 mg Intravenous Given 09/21/21 0831)   famotidine (PF) (Pepcid) injection 20 mg (20 mg Intravenous Given 09/21/21 1020)   acetaminophen (TylenoL) tablet 1,000 mg (1,000 mg Oral Given 09/21/21 1018)       Electronically signed by Rolm Bookbinder, MD at 09/21/2021 11:36 AM EDT

## 2021-10-02 NOTE — ED Notes (Signed)
Formatting of this note might be different from the original.  Pain assessment on discharge was tolerable.  Condition stable.  Patient discharged to home.  Patient education was completed:  yes  Education taught to:  patient  Teaching method used was discussion and handout.  Understanding of teaching was good.  Patient was discharged ambulatory.  Discharged with self.  Valuables were given to: patient.   PIV removed. Catheter intact.  Electronically signed by Jacquelin Hawking, RN at 10/02/2021  9:07 PM EDT

## 2021-10-02 NOTE — ED Provider Notes (Signed)
Formatting of this note is different from the original.  Images from the original note were not included.    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  Considered but less likely diverticulitis.  Low clinical suspicion for aortic dissection or other vascular catastrophe.    Social Determinants of Health:  substance use and no primary care provider       Supplemental Historians include:  patient and medical records    ED Course:   Reviewed old records and PMP as below.  Most recent prescription was on September 7 by Dr. Randel Pigg appears to be pain management with [facility].  Prescription duration was 4 a maximum of 7 days which would place her out on the 14th consistent with her history of being out of meds since Friday which would have been the 15th.  Reviewed multiple prior imaging studies, most recent on 08/12/2021.  Had a previous identified pancreatic cyst that seem to be slowly decreasing in size.    8:56 PM -after second round of medication, patient states he is feeling much better.  Results as below, unremarkable.  All results discussed with patient.  Given her normal labs and pain control, consider but do not think she needs to be admitted at this time.  Stable for discharge.  We will follow-up with Dr. Randel Pigg tomorrow to get refill if available.        Documentation/Prior Results Review:  Old medical records, Nursing notes, Previous radiology studies, Other Electronic Medical Record    Rhythm interpretation from monitor: N/A    Imaging Interpreted by me: Not Applicable    No orders to display     .     Discussion of Mangement with other Physicians, QHP or Appropriate Source:   None    .    Disposition:  Home    New Prescriptions     No medications on file     Chief Complaint   Patient presents with    ABDOMINAL PAIN    VOMITING    COUGH    CONGESTION     HPI   This is a 31 year old female with a longstanding history of chronic abdominal pain.  She has had multiple ED and specialist evaluations for that.  She has not seen GI to this point in time secondary to lack of availability of appointments.  She has actually seen pain management.  She is reporting being out of her chronic Percocet since Friday, 2 days ago.  This morning, pain is much worse than usual.  Nausea but no vomiting.  No dysuria or hematuria.  No fever.    Review of Systems    Physical Exam  Vitals and nursing note reviewed.   Constitutional:       General: She is in acute distress (d/t pain).      Appearance: She is well-developed. She is not toxic-appearing or diaphoretic.   HENT:      Head: Atraumatic.   Eyes:      General: No scleral icterus.     Conjunctiva/sclera: Conjunctivae normal.   Neck:      Trachea: Phonation normal.   Cardiovascular:      Rate and Rhythm: Regular rhythm. Tachycardia present.  Pulmonary:      Effort: Pulmonary effort is normal. No accessory muscle usage or respiratory distress.      Breath sounds: No stridor.   Abdominal:      General: There is no distension.      Palpations: Abdomen is soft.      Tenderness: There is no abdominal tenderness.   Skin:     General: Skin is warm and dry.   Neurological:      Mental Status: She is alert and oriented to person, place, and time.   Psychiatric:         Mood and Affect: Mood is anxious. Affect is tearful.         Behavior: Behavior is cooperative.         Thought Content: Thought content normal. Thought content is not paranoid or delusional. Thought content does not include homicidal or suicidal ideation. Thought content does not include homicidal or suicidal plan.     Past Medical History:   Diagnosis Date    Alcohol-induced acute pancreatitis     no longer drinks EtOH regularly    Class 1 obesity with body  mass index (BMI) of 32.0 to 32.9 in adult     Gallstones     per pt report     Past Surgical History:   Procedure Laterality Date    LAP CHOLECYSTECTOMY N/A 02/10/2021    Procedure: CHOLECYSTECTOMY, LAPAROSCOPIC, WITH CHOLANGIOGRAM;  Surgeon: Wynelle Fanny, MD     Family History   Problem Relation Age of Onset    No Known Problems Mother     Other Family History Father         died from trauma     Social History     Occupational History    Not on file   Tobacco Use    Smoking status: Every Day     Types: Cigarettes    Smokeless tobacco: Never   Substance and Sexual Activity    Alcohol use: Not on file    Drug use: Not on file    Sexual activity: Not on file     No outpatient medications have been marked as taking for the 10/02/21 encounter [facility] Encounter).     Allergies   Allergen Reactions    Droperidol neurological reaction     Pt states "face twitching and couldn't stop legs from moving"     Vital Signs:  Patient Vitals for the past 72 hrs:   Temp Heart Rate Resp BP BP Mean SpO2 Weight   10/02/21 2002 98.6 F (37 C) 76 18 133/76 95 MM HG 99 % --   10/02/21 1750 -- 116 20 (!) 194/113 (!) 140 MM HG 98 % --   10/02/21 1418 98 F (36.7 C) 113 18 145/89 (!) 108 MM HG 97 % 92.4 kg (203 lb 12.8 oz)     Diagnostics:  Labs:    Results for orders placed or performed during the hospital encounter of 10/02/21   GLUCOSE SCREEN   Result Value Ref Range    Glucose POC 128 (H) 70 - 99 mg/dL   BASIC METABOLIC PANEL   Result Value Ref Range    Potassium 4.4 3.5 - 5.5 mmol/L    Sodium 134 133 - 145 mmol/L    Chloride 101 98 - 110 mmol/L    Glucose 101 (H) 70 - 99 mg/dL    Calcium 9.6 8.4 - 10.5 mg/dL    BUN 3 (L) 6 - 22 mg/dL  Creatinine 0.5 0.5 - 1.2 mg/dL    CO2 21 20 - 32 mmol/L    eGFR >60.0 >60.0 mL/min/1.73 sq.m.    Anion Gap 12.0 3.0 - 15.0 mmol/L   HEPATIC FUNCTION PANEL   Result Value Ref Range    Albumin 4.5 3.5 - 5.0 g/dL    Total Protein 8.2 6.4 - 8.3 g/dL    Globulin 3.7 2.0 - 4.0 g/dL    A/G Ratio 1.2 1.1 -  2.6 ratio    Bilirubin Total 0.5 0.2 - 1.2 mg/dL    Bilirubin Direct <0.2 0.0 - 0.3 mg/dL    SGOT (AST) 30 10 - 37 U/L    Alkaline Phosphatase 81 25 - 115 U/L    SGPT (ALT) 16 5 - 40 U/L   Lipase   Result Value Ref Range    Lipase 23 7 - 60 U/L   PREGNANCY SERUM   Result Value Ref Range    PREGNANCY SERUM Negative Negative   CBC WITH DIFFERENTIAL AUTO   Result Value Ref Range    WBC 5.3 4.0 - 11.0 K/uL    RBC 4.39 3.80 - 5.20 M/uL    HGB 11.3 (L) 11.7 - 15.5 g/dL    HCT 32.0 (L) 35.1 - 46.5 %    MCV 73 (L) 80 - 99 fL    MCH 26 26 - 34 pg    MCHC 35 31 - 36 g/dL    RDW 17.0 (H) 10.0 - 15.5 %    Platelet 239 140 - 440 K/uL    MPV 10.7 9.0 - 13.0 fL    Segmented Neutrophils (Auto) 55 40 - 75 %    Lymphocytes (Auto) 31 20 - 45 %    Monocytes (Auto) 10 3 - 12 %    Eosinophils (Auto) 4 0 - 6 %    Basophils (Auto) 0 0 - 2 %    Absolute Neutrophils (Auto) 2.9 1.8 - 7.7 K/uL    Absolute Lymphocytes (Auto) 1.6 1.0 - 4.8 K/uL    Absolute Monocytes (Auto) 0.6 0.1 - 1.0 K/uL    Absolute Eosinophils (Auto) 0.2 0.0 - 0.5 K/uL    Absolute Basophils (Auto) 0.0 0.0 - 0.2 K/uL   Urinalysis w Micro Reflex Culture    Specimen: Clean Catch Urine   Result Value Ref Range    Source Urine      Urine Color Yellow Colorless, Pale Yellow, Light Yellow, Yellow, Dark Yellow, Straw    Urine Clarity Clear Clear, Slightly Cloudy    Urine pH 7.0 5.0 - 8.0 pH    Urine Protein Screen Negative Negative, Trace mg/dL    Urine Glucose Negative Negative mg/dL    Urine Ketones Negative Negative mg/dL    Urine Occult Blood Negative Negative    Urine Specific Gravity 1.006 1.005 - 1.030    Urine Nitrite Negative Negative    Urine Leukocyte Esterase Negative Negative    Urine Bilirubin Negative Negative    Urine Urobilinogen 0.2 <2.0 mg/dL mg/dL    Urine RBC 0-2 Negative, 0-2 /hpf    Urine WBC 0-2 0 - 5 /hpf    Urine Bacteria Negative Negative    Squamous Epithelial Cells 3-5 (A) None, 0-2 /hpf    Hyaline Cast 0-2 0 - 2 /lpf   Urine Drug Screen 9   Result  Value Ref Range    Amphetamine Screen NDET NDET    Opiate Screen NDET NDET    Cocaine Screen DET (A) NDET  Cannabinoids Screen DET (A) NDET    Barbiturates Screen Urine NDET NDET    Benzodiazepine Screen NDET NDET    Hydrocodone Screen Urine NDET NDET    Oxycodone Screen DET (A) NDET    6-Acetylmorphine Screen NDET NDET    pH Urine Drug 7.0 4.5 - 8.0    SPECIFIC GRAVITY URINE 1.006 1.003 - 1.030     ECG:  No results found for this visit on 10/02/21.    Medications ordered/given in the ED  Medications   Lactated Ringers (LR) infusion (0 mL Intravenous End Infusion 10/02/21 1642)   morphine injection 4 mg (4 mg Intravenous Given 10/02/21 1546)   ondansetron (PF) (Zofran) injection 4 mg (4 mg IV Push Given 10/02/21 1546)   morphine injection 4 mg (4 mg Intravenous Given 10/02/21 1850)       Electronically signed by Joellen Jersey, MD at 10/02/2021  8:57 PM EDT

## 2021-10-02 NOTE — ED Notes (Signed)
Formatting of this note is different from the original.     10/02/21 1418   Vital Signs   Temp 98 F (36.7 C)   Temp Source Tympanic   Heart Rate 113   Resp 18   BP 145/89   BP Mean (!) 108 MM HG   SpO2 97 %   Device (Oxygen Therapy) room air   Height 5\' 5"  (1.651 m)   Weight 92.4 kg (203 lb 12.8 oz)   Weight Method  Standing weight   BMI (Calculated) 33.91       Electronically signed by Jerold Coombe at 10/02/2021  2:19 PM EDT

## 2021-10-02 NOTE — ED Triage Notes (Signed)
Formatting of this note might be different from the original.  Pt brought in ambulatory to Triage c/o nausea, vomiting, severe mid-abdominal pain, cough and congestion that began today.  Hx: pancreatitis, intractable vomiting  Accucheck in Triage: 128 mg/dl  Electronically signed by Collene Mares, RN at 10/02/2021  2:22 PM EDT

## 2021-10-04 NOTE — ED Notes (Signed)
Formatting of this note is different from the original.     10/04/21 1248   PIV: 10/04/21 1245 22 gauge Hand Left;Posterior   Placement Date/Time: 10/04/21 1245   Pre-existing LDA: No  Catheter Type: Insyte BC  Needle Gauge: 22 gauge  Location: Hand  Orientation: Left;Posterior  Patient Tolerance: Insertion: tolerated well   Site Assessment WDL   Line status Saline lock   Dressing Type Transparent / Semipermeable   Dressing Status WDL   Securement site guard in place       Electronically signed by Dossie Der at 10/04/2021 12:48 PM EDT

## 2021-10-04 NOTE — ED Triage Notes (Signed)
Formatting of this note might be different from the original.  Patient with abdominal pain. States this is her pancreatitis.   Electronically signed by Margie Ege, RN at 10/04/2021 11:30 AM EDT

## 2021-10-04 NOTE — ED Notes (Signed)
Formatting of this note might be different from the original.  Called for medicaid cab ref# [id number], ETEA between 24mins to 3hrs.  Electronically signed by Rosalio Macadamia at 10/04/2021  8:08 PM EDT

## 2021-10-04 NOTE — ED Triage Notes (Signed)
Formatting of this note might be different from the original.  Pt amb to [facility] from work c/o abd pain and vomiting x3 days.   Electronically signed by Cy Blamer, RN at 10/04/2021  6:00 PM EDT

## 2021-10-04 NOTE — ED Notes (Signed)
Formatting of this note is different from the original.     10/04/21 1248   Lab Draw   Lab Draw Time 1245   Lab Sent Time 1248   Lab Draw Site Left;Hand   Lab Draw Device Angiocatheter   Device Gauge 22   # of Lab Draw Attempts 1 Attempt   Labs Drawn with IV Start Yes   Lab Tubes Rainbow       Electronically signed by Dossie Der at 10/04/2021 12:48 PM EDT

## 2021-10-04 NOTE — ED Notes (Signed)
Formatting of this note might be different from the original.  Pain assessment on discharge was addressed.  Condition stable.  Patient discharged to home.  Patient education was completed:  yes  Education taught to:  patient  Teaching method used was discussion and handout.  Understanding of teaching was good.  Patient was discharged ambulatory.  Discharged with self.  Valuables were given to: patient.   Electronically signed by Virgina Organ, RN at 10/04/2021  7:16 PM EDT

## 2021-10-04 NOTE — ED Notes (Signed)
Formatting of this note might be different from the original.  Pt requesting to speak with pt advocate, pt advocacy contacted.  Electronically signed by Ladora Daniel, RN at 10/04/2021  1:48 PM EDT

## 2021-10-04 NOTE — ED Notes (Signed)
Formatting of this note might be different from the original.  Pain assessment on discharge was 2.  Condition stable.  Patient discharged to home.  Patient education was completed:  yes  Education taught to:  patient  Teaching method used was discussion.  Understanding of teaching was good.  Patient was discharged ambulatory.  Discharged with self.  Valuables were given to: patient.    Electronically signed by Eligah East, RN at 10/04/2021  4:32 PM EDT

## 2021-10-04 NOTE — ED Provider Notes (Signed)
Formatting of this note is different from the original.    [place]    Time of Arrival:   10/04/21 1124    Final diagnoses:   [K85.90] Acute pancreatitis, unspecified complication status, unspecified pancreatitis type (Primary)   [R10.9, G89.29] Chronic abdominal pain     Medical Decision Making:      Differential Diagnosis:       31 year old female with chronic abdominal pain, pancreatitis, opiate use disorder, cocaine abuse, chronic marijuana use presenting with abdominal pain consistent in quality and characteristics to her prior episodes of pancreatitis.  I have significant concerns about this patient's opiate use.  She has had 8 ED visits in the last 30 days as well as 34 visits since April 2023, 8 CTs in the last year.  We will screen labs, hydrate, treat her vomiting.  If her work-up reveals evidence of acute intra-abdominal pathology that warrants more aggressive pain control, that will be provided.    Social Determinants of Health:  substance use and no primary care provider                               Supplemental Historians include:  patient and medical records    ED Course:     IV fluids  Tylenol  Zofran  Had continued vomiting given Pepcid and repeat Zofran  Had continued vomiting given Haldol IV and Dilaudid IV    Work-up notable for white count 7.9  BMP unremarkable  Lipase of 175    4:15 PM she is feeling better and is ready for discharge home.  I have encouraged her to contact her pain management doctor for ongoing management of her chronic recurrent bouts of abdominal pain as well as continuing to pursue follow-up with PCP and GI.  I encouraged her to abstain from any and all substance use as this may be having significant contribution to her recurrent bouts of abdominal pain.      Advanced imaging and admission were both considered but not indicated based on clinical presentation, laboratory work-up and response to treatment.    Documentation/Prior Results  Review:  Nursing notes    Rhythm interpretation from monitor: N/A    Imaging Interpreted by me:     No orders to display     .     Discussion of Mangement with other Physicians, QHP or Appropriate Source:   None    .    Disposition:  Home    New Prescriptions    No medications on file     Chief Complaint   Patient presents with    ABDOMINAL PAIN     Rhonda Hammond is a 31 y.o. female with history of pancreatitis, chronic abdominal pain, chronic cannabis use, cocaine use, opiate dependence who presents complaining of abdominal pain consistent with her pancreatitis.  She complains of nausea, vomiting, and upper abdominal pain.  Similar in characteristic and quality to her pancreatitis.  This is her eighth ED visit in the last 30 days for the same most recently on 10/02/2021 when she presented stating she was out of her pain medicine and having abdominal pain, her labs are unremarkable other than she tested positive for cocaine, opiates, and cannabis.  Last opiate prescription was for 42 pills on 09/22/2021.          Review of Systems    Physical Exam  HENT:      Head: Atraumatic.  Right Ear: External ear normal.      Left Ear: External ear normal.      Nose: Nose normal.      Mouth/Throat:      Mouth: Mucous membranes are moist.   Eyes:      Conjunctiva/sclera: Conjunctivae normal.   Cardiovascular:      Rate and Rhythm: Normal rate and regular rhythm.   Pulmonary:      Effort: Pulmonary effort is normal.      Breath sounds: Normal breath sounds.   Abdominal:      General: There is no distension.      Tenderness: There is abdominal tenderness in the epigastric area. There is no right CVA tenderness, left CVA tenderness or guarding. Negative signs include Murphy's sign.   Musculoskeletal:         General: No deformity. Normal range of motion.      Cervical back: Normal range of motion.   Skin:     General: Skin is warm and dry.      Coloration: Skin is not jaundiced.   Neurological:      Mental Status: She is alert.       Cranial Nerves: No cranial nerve deficit.      Motor: Motor function is intact.   Psychiatric:         Mood and Affect: Mood normal. Affect is labile, angry and tearful.         Behavior: Behavior is agitated.     Past Medical History:   Diagnosis Date    Alcohol-induced acute pancreatitis     no longer drinks EtOH regularly    Class 1 obesity with body mass index (BMI) of 32.0 to 32.9 in adult     Gallstones     per pt report     Past Surgical History:   Procedure Laterality Date    LAP CHOLECYSTECTOMY N/A 02/10/2021    Procedure: CHOLECYSTECTOMY, LAPAROSCOPIC, WITH CHOLANGIOGRAM;  Surgeon: Wynelle Fanny, MD     Family History   Problem Relation Age of Onset    No Known Problems Mother     Other Family History Father         died from trauma     Social History     Occupational History    Not on file   Tobacco Use    Smoking status: Every Day     Types: Cigarettes    Smokeless tobacco: Never   Substance and Sexual Activity    Alcohol use: Not on file    Drug use: Not on file    Sexual activity: Not on file     No outpatient medications have been marked as taking for the 10/04/21 encounter [facility] Encounter).     Allergies   Allergen Reactions    Droperidol neurological reaction     Pt states "face twitching and couldn't stop legs from moving"     Vital Signs:  Patient Vitals for the past 72 hrs:   Temp Heart Rate Resp BP BP Mean SpO2   10/04/21 1126 97.3 F (36.3 C) 97 20 130/53 79 MM HG 99 %     Diagnostics:  Labs:    Results for orders placed or performed during the hospital encounter of 34/74/25   BASIC METABOLIC PANEL   Result Value Ref Range    Potassium 4.2 3.5 - 5.5 mmol/L    Sodium 139 133 - 145 mmol/L    Chloride 103 98 - 110 mmol/L  Glucose 118 (H) 70 - 99 mg/dL    Calcium 9.7 8.4 - 10.5 mg/dL    BUN 4 (L) 6 - 22 mg/dL    Creatinine 0.5 0.5 - 1.2 mg/dL    CO2 22 20 - 32 mmol/L    eGFR >60.0 >60.0 mL/min/1.73 sq.m.    Anion Gap 14.0 3.0 - 15.0 mmol/L   HEPATIC FUNCTION PANEL   Result Value Ref  Range    Albumin 4.6 3.5 - 5.0 g/dL    Total Protein 8.3 6.4 - 8.3 g/dL    Globulin 3.7 2.0 - 4.0 g/dL    A/G Ratio 1.2 1.1 - 2.6 ratio    Bilirubin Total 0.3 0.2 - 1.2 mg/dL    Bilirubin Direct <0.2 0.0 - 0.3 mg/dL    SGOT (AST) 26 10 - 37 U/L    Alkaline Phosphatase 71 25 - 115 U/L    SGPT (ALT) 13 5 - 40 U/L   Lipase   Result Value Ref Range    Lipase 175 (H) 7 - 60 U/L   Pregnancy, Serum   Result Value Ref Range    PREGNANCY SERUM Negative Negative   CBC WITH DIFFERENTIAL AUTO   Result Value Ref Range    WBC 7.9 4.0 - 11.0 K/uL    RBC 4.44 3.80 - 5.20 M/uL    HGB 11.5 (L) 11.7 - 15.5 g/dL    HCT 32.6 (L) 35.1 - 46.5 %    MCV 73 (L) 80 - 99 fL    MCH 26 26 - 34 pg    MCHC 35 31 - 36 g/dL    RDW 17.3 (H) 10.0 - 15.5 %    Platelet 230 140 - 440 K/uL    MPV 11.1 9.0 - 13.0 fL    Segmented Neutrophils (Auto) 70 40 - 75 %    Lymphocytes (Auto) 22 20 - 45 %    Monocytes (Auto) 7 3 - 12 %    Eosinophils (Auto) 2 0 - 6 %    Basophils (Auto) 0 0 - 2 %    Absolute Neutrophils (Auto) 5.5 1.8 - 7.7 K/uL    Absolute Lymphocytes (Auto) 1.7 1.0 - 4.8 K/uL    Absolute Monocytes (Auto) 0.6 0.1 - 1.0 K/uL    Absolute Eosinophils (Auto) 0.1 0.0 - 0.5 K/uL    Absolute Basophils (Auto) 0.0 0.0 - 0.2 K/uL     ECG:  No results found for this visit on 10/04/21.    Medications ordered/given in the ED  Medications   acetaminophen (TylenoL) tablet 1,000 mg (1,000 mg Oral Refused 10/04/21 1250)   sodium chloride (normal saline) 0.9% infusion (1,000 mL Intravenous New Bag 10/04/21 1249)   ondansetron (PF) (Zofran) injection 4 mg (4 mg Intravenous Given 10/04/21 1250)   famotidine (PF) (Pepcid) injection 20 mg (20 mg Intravenous Given 10/04/21 1348)   ondansetron (PF) (Zofran) injection 4 mg (4 mg Intravenous Given 10/04/21 1347)   haloperidol (HaldoL) injection 2.5 mg (2.5 mg Intravenous Given 10/04/21 1418)   HYDROmorphone (Dilaudid) injection 1 mg (1 mg IV Push Given 10/04/21 1418)       Electronically signed by Rolm Bookbinder, MD at 10/04/2021   4:16 PM EDT

## 2021-10-04 NOTE — ED Notes (Signed)
Formatting of this note is different from the original.     10/04/21 1126   Vital signs   BP 130/53   BP Mean 79 MM HG   Heart Rate 97   Temp 97.3 F (36.3 C)   Temp Source Tympanic   Resp 20   SpO2 99 %   Device (Oxygen Therapy) room air     HT:5'5    Wt: 203#  Electronically signed by Ermalene Postin at 10/04/2021 11:28 AM EDT

## 2021-10-05 ENCOUNTER — Observation Stay: Admit: 2021-10-05 | Payer: PRIVATE HEALTH INSURANCE

## 2021-10-05 ENCOUNTER — Inpatient Hospital Stay
Admission: EM | Admit: 2021-10-05 | Discharge: 2021-10-12 | Disposition: A | Discharge status: Home / Self Care | Payer: PRIVATE HEALTH INSURANCE | Admitting: Internal Medicine

## 2021-10-05 DIAGNOSIS — K859 Acute pancreatitis without necrosis or infection, unspecified: Secondary | ICD-10-CM

## 2021-10-05 DIAGNOSIS — K861 Other chronic pancreatitis: Secondary | ICD-10-CM

## 2021-10-05 LAB — CBC WITH AUTO DIFFERENTIAL
Basophils: 0.2 % (ref 0–3)
Eosinophils: 0 % (ref 0–5)
Hematocrit: 30.4 % — ABNORMAL LOW (ref 35.0–47.0)
Hemoglobin: 10.3 gm/dl — ABNORMAL LOW (ref 11.0–16.0)
Immature Granulocytes: 0.4 % (ref 0.0–3.0)
Lymphocytes: 7 % — ABNORMAL LOW (ref 28–48)
MCH: 25 pg — ABNORMAL LOW (ref 25.4–34.6)
MCHC: 33.9 gm/dl (ref 30.0–36.0)
MCV: 73.8 fL — ABNORMAL LOW (ref 80.0–98.0)
MPV: 11 fL — ABNORMAL HIGH (ref 6.0–10.0)
Monocytes: 4.6 % (ref 1–13)
Neutrophils Segmented: 87.8 % — ABNORMAL HIGH (ref 34–64)
Nucleated RBCs: 0 (ref 0–0)
Platelets: 215 10*3/uL (ref 140–450)
RBC: 4.12 M/uL (ref 3.60–5.20)
RDW: 45.7 (ref 36.4–46.3)
WBC: 10.8 10*3/uL (ref 4.0–11.0)

## 2021-10-05 LAB — COMPREHENSIVE METABOLIC PANEL
ALT: 10 U/L (ref 10–49)
AST: 18 U/L (ref 0.0–33.9)
Albumin: 4.2 gm/dl (ref 3.4–5.0)
Alkaline Phosphatase: 61 U/L (ref 46–116)
Anion Gap: 11 mmol/L (ref 5–15)
BUN: <5 mg/dl — ABNORMAL LOW (ref 9–23)
CO2: 22 mEq/L (ref 20–31)
Calcium: 9.4 mg/dl (ref 8.7–10.4)
Chloride: 106 mEq/L (ref 98–107)
Creatinine: 0.5 mg/dl — ABNORMAL LOW (ref 0.55–1.02)
GFR African American: >60
GFR Non-African American: >60
Glucose: 123 mg/dl — ABNORMAL HIGH (ref 74–106)
Potassium: 3.8 mEq/L (ref 3.5–5.1)
Sodium: 139 mEq/L (ref 136–145)
Total Bilirubin: 0.4 mg/dl (ref 0.30–1.20)
Total Protein: 7.4 gm/dl (ref 5.7–8.2)

## 2021-10-05 LAB — TRIGLYCERIDES: Triglycerides: 41 mg/dl (ref 0–150)

## 2021-10-05 LAB — CALCIUM, IONIZED: Calcium, Ionized: 4.8 mg/dl (ref 4.4–5.4)

## 2021-10-05 LAB — LIPASE: Lipase: 481 U/L — ABNORMAL HIGH (ref 12–53)

## 2021-10-05 MED ORDER — SODIUM CHLORIDE 0.9 % IV SOLN
0.9 % | Freq: Once | INTRAVENOUS | Status: DC
Start: 2021-10-05 — End: 2021-10-12

## 2021-10-05 MED ORDER — DICYCLOMINE HCL 10 MG/ML IM SOLN
10 MG/ML | INTRAMUSCULAR | Status: AC
Start: 2021-10-05 — End: 2021-10-05
  Administered 2021-10-05: 06:00:00 20 mg via INTRAMUSCULAR

## 2021-10-05 MED ORDER — SODIUM CHLORIDE 0.9 % IV BOLUS
0.9 % | Freq: Once | INTRAVENOUS | Status: AC
Start: 2021-10-05 — End: 2021-10-05
  Administered 2021-10-05: 07:00:00 1000 mL via INTRAVENOUS

## 2021-10-05 MED ORDER — MORPHINE SULFATE (PF) 2 MG/ML IV SOLN
2 MG/ML | INTRAVENOUS | Status: AC | PRN
Start: 2021-10-05 — End: ?
  Administered 2021-10-05 – 2021-10-06 (×8): 2 mg via INTRAVENOUS

## 2021-10-05 MED ORDER — ONDANSETRON HCL 4 MG/2ML IJ SOLN
4 MG/2ML | Freq: Four times a day (QID) | INTRAMUSCULAR | Status: DC | PRN
Start: 2021-10-05 — End: 2021-10-12
  Administered 2021-10-05 – 2021-10-10 (×5): 4 mg via INTRAVENOUS

## 2021-10-05 MED ORDER — ONDANSETRON HCL 4 MG/2ML IJ SOLN
4 MG/2ML | Freq: Once | INTRAMUSCULAR | Status: AC
Start: 2021-10-05 — End: 2021-10-05
  Administered 2021-10-05: 07:00:00 4 mg via INTRAVENOUS

## 2021-10-05 MED ORDER — FAMOTIDINE (PF) 20 MG/2ML IV SOLN
202 MG/2ML | INTRAVENOUS | Status: AC
Start: 2021-10-05 — End: 2021-10-05
  Administered 2021-10-05: 06:00:00 20 mg via INTRAVENOUS

## 2021-10-05 MED ORDER — MORPHINE SULFATE 4 MG/ML IJ SOLN
4 MG/ML | INTRAMUSCULAR | Status: AC
Start: 2021-10-05 — End: 2021-10-05
  Administered 2021-10-05: 07:00:00 4 mg via INTRAVENOUS

## 2021-10-05 MED ORDER — NORMAL SALINE FLUSH 0.9 % IV SOLN
0.9 % | INTRAVENOUS | Status: AC | PRN
Start: 2021-10-05 — End: 2021-10-12
  Administered 2021-10-10: 13:00:00 10 mL via INTRAVENOUS

## 2021-10-05 MED ORDER — DICYCLOMINE HCL 10 MG PO CAPS
10 MG | Freq: Four times a day (QID) | ORAL | Status: DC | PRN
Start: 2021-10-05 — End: 2021-10-12
  Administered 2021-10-08 – 2021-10-09 (×2): 20 mg via ORAL

## 2021-10-05 MED ORDER — FAMOTIDINE (PF) 20 MG/2ML IV SOLN
202 MG/2ML | Freq: Two times a day (BID) | INTRAVENOUS | Status: DC
Start: 2021-10-05 — End: 2021-10-10
  Administered 2021-10-05 – 2021-10-10 (×11): 20 mg via INTRAVENOUS

## 2021-10-05 MED ORDER — SODIUM CHLORIDE 0.9 % IV SOLN
0.9 % | INTRAVENOUS | Status: DC | PRN
Start: 2021-10-05 — End: 2021-10-12
  Administered 2021-10-06: 14:00:00 via INTRAVENOUS

## 2021-10-05 MED ORDER — KETOROLAC TROMETHAMINE 30 MG/ML IJ SOLN
30 MG/ML | INTRAMUSCULAR | Status: AC
Start: 2021-10-05 — End: 2021-10-05
  Administered 2021-10-05: 06:00:00 30 mg via INTRAVENOUS

## 2021-10-05 MED ORDER — NORMAL SALINE FLUSH 0.9 % IV SOLN
0.9 % | Freq: Two times a day (BID) | INTRAVENOUS | Status: AC
Start: 2021-10-05 — End: 2021-10-12
  Administered 2021-10-05 – 2021-10-12 (×15): 10 mL via INTRAVENOUS

## 2021-10-05 MED ORDER — ACETAMINOPHEN 325 MG PO TABS
325 MG | Freq: Four times a day (QID) | ORAL | Status: DC | PRN
Start: 2021-10-05 — End: 2021-10-12
  Administered 2021-10-05 – 2021-10-07 (×3): 650 mg via ORAL

## 2021-10-05 MED ORDER — HALOPERIDOL 5 MG PO TABS
5 MG | Freq: Four times a day (QID) | ORAL | Status: DC | PRN
Start: 2021-10-05 — End: 2021-10-12
  Administered 2021-10-06 – 2021-10-07 (×2): 5 mg via ORAL

## 2021-10-05 MED ORDER — ACETAMINOPHEN 650 MG RE SUPP
650 | Freq: Four times a day (QID) | RECTAL | Status: DC | PRN
Start: 2021-10-05 — End: 2021-10-12

## 2021-10-05 MED ORDER — POLYETHYLENE GLYCOL 3350 17 G PO PACK
17 g | Freq: Every day | ORAL | Status: DC | PRN
Start: 2021-10-05 — End: 2021-10-12

## 2021-10-05 MED ORDER — SODIUM CHLORIDE 0.9 % IV SOLN
0.9 % | INTRAVENOUS | Status: AC
Start: 2021-10-05 — End: 2021-10-05
  Administered 2021-10-05: 10:00:00 via INTRAVENOUS

## 2021-10-05 MED FILL — FAMOTIDINE (PF) 20 MG/2ML IV SOLN: 20 MG/2ML | INTRAVENOUS | Qty: 2

## 2021-10-05 MED FILL — MORPHINE SULFATE 4 MG/ML IJ SOLN: 4 mg/mL | INTRAMUSCULAR | Qty: 1

## 2021-10-05 MED FILL — SODIUM CHLORIDE FLUSH 0.9 % IV SOLN: 0.9 % | INTRAVENOUS | Qty: 10

## 2021-10-05 MED FILL — DICYCLOMINE HCL 10 MG/ML IM SOLN: 10 MG/ML | INTRAMUSCULAR | Qty: 2

## 2021-10-05 MED FILL — ONDANSETRON HCL 4 MG/2ML IJ SOLN: 4 MG/2ML | INTRAMUSCULAR | Qty: 2

## 2021-10-05 MED FILL — MORPHINE SULFATE 2 MG/ML IJ SOLN: 2 mg/mL | INTRAMUSCULAR | Qty: 1

## 2021-10-05 MED FILL — SODIUM CHLORIDE 0.9 % IV SOLN: 0.9 % | INTRAVENOUS | Qty: 1000

## 2021-10-05 MED FILL — KETOROLAC TROMETHAMINE 30 MG/ML IJ SOLN: 30 MG/ML | INTRAMUSCULAR | Qty: 1

## 2021-10-05 MED FILL — ACETAMINOPHEN 325 MG PO TABS: 325 MG | ORAL | Qty: 2

## 2021-10-05 NOTE — ED Triage Notes (Signed)
Arrived by medic from home    Chief complaint is abdominal pain and vomiting     Seen at [facility] on Sunday and Tuesday , diagnosed, treated for Pancreatitis and discharged.

## 2021-10-05 NOTE — ED Notes (Signed)
7341: pt arrives ambulatory from EMS stretcher into room. Pt verbalizes pain from recently dx pancreatitis remaining uncontrolled. Pt holding emesis bag with liquid appearing to be mostly stomach acid. Verbalizes last drink of ETOH x2wks PTA.     Theodis Aguas, RN  10/05/21 0100

## 2021-10-05 NOTE — Progress Notes (Signed)
See H&p for details leading to initial presentation and treatment plan  Pt admitted for acute pancreatitis. Non-obstructive pattern for LFTs. NPO per pancreatitis protocol. PRN pain meds ordered.

## 2021-10-05 NOTE — ED Notes (Addendum)
assumed care of pt. pt in distress related to pain 10/10    pt medicated per MAR . medications verified and checked . patient ID and allergies verified with patient and cross checked with ID band prior to administration . will continue to monitor and reasses . patient verbalized understanding of medication.    pt stated that the meds given were not the usual meds she has received in the past. She stated that she usually gets dilaudid or morphine with zofran.     i stated that different doctors medicated differently for diagnosis and if the meds given do not work then she needs to let this Probation officer know.    pt then stated that the meds were not working.    this Probation officer stated that is had not been 5 mins since administration and to give the meds a chance to do there job.    this Probation officer then turned off the lights and television to reinforce a relaxing/healing atmosphere.       Margorie John, RN  10/05/21 [phone]

## 2021-10-05 NOTE — ED Notes (Signed)
TRANSFER - OUT REPORT:    Verbal report given to [address]west rn on Letecia M Nickell being transferred to 5213 for routine progression of patient care       Report consisted of patient's Situation, Background, Assessment and   Recommendations(SBAR).     Information from the following report(s) ED SBAR, MAR, and Recent Results was reviewed with the receiving nurse.  Kinder Assessment: Presents to emergency department  because of falls (Syncope, seizure, or loss of consciousness): No, Age > 78: No, Altered Mental Status, Intoxication with alcohol or substance confusion (Disorientation, impaired judgment, poor safety awaremess, or inability to follow instructions): No, Impaired Mobility: Ambulates or transfers with assistive devices or assistance; Unable to ambulate or transer.: No  Lines:   Peripheral IV 10/05/21 Left Antecubital (Active)      Medications sent with patient from pharmacy: None to send  Patient belongings: Belongings sent to floor    Opportunity for questions and clarification was provided.      Patient transported with:  [place], RN  10/05/21 [phone]

## 2021-10-05 NOTE — ED Notes (Signed)
Pt is laying in bed with her eyes closed. No signs of distress noted. Unlabored breathing with symmetrical chest rise and fall. Call bell within reach.  No further assistance needed.        Margorie John, RN  10/05/21 0400

## 2021-10-05 NOTE — ED Notes (Signed)
pt provided warm compresses for abd pain and ns bolus was started per [place], RN  10/05/21 [phone]

## 2021-10-05 NOTE — H&P (Signed)
Medicine History and Physical    Patient: Rhonda Hammond Age: 31 y.o. Sex: female    Date of Birth: 05/25/1990 Admit Date: 10/05/2021 PCP: None None   MRN: [id number]  CSN: [id number]         Assessment   Acute pancreatitis   Intractable nausea and vomiting  Chronic abdominal pain   Hx of chronic pancreatitis, alcohol related   Hx of pancreatic pseudocyst   Hx of Polysubstance dependence, recent UTOX positive for cocaine       Plan   IVF   Pain management   Zofran PRN   Ion Ca, TGL level   Diet NPO   DVT PPX Heparin sq  ACP: CODE STATUS FULL CODE       Chief Complaint:  Chief Complaint   Patient presents with    Abdominal Pain         HPI:   Rhonda Hammond is a 31 y.o. year old female with PMH as noted above who presents with abdominal pain and vomiting. Patient has hx of chronic pancreatitis and follows with pain management. She went to ED three days ago with worsened abdominal pain. She has visited multiple EDs over past few days.    Lipase with mild elevation at 481 today.          Review of Systems - 12 Point ROS -ve except what is noted in the HPI.     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     Intractable nausea and vomiting 06/26/2020    Obesity 12/16/2018    Pancreatitis        Past Surgical History:  Past Surgical History:   Procedure Laterality Date    CESAREAN SECTION      CHOLECYSTECTOMY         Family History:  Family History   Problem Relation Age of Onset    No Known Problems Mother        Social History:  Social History     Socioeconomic History    Marital status: Single   Tobacco Use    Smoking status: Every Day     Types: Cigars   Substance and Sexual Activity    Alcohol use: Yes     Alcohol/week: 21.0 standard drinks of alcohol     Types: 21 Shots of liquor per week     Comment: 2-3 drinks a day, sometimes more    Drug  use: Yes     Types: Marijuana Sheran Fava)       Home Medications:  Prior to Admission medications    Medication Sig Start Date End Date Taking? Authorizing Provider   ondansetron (ZOFRAN) 4 MG tablet Take 1 tablet by mouth every 8 hours as needed for Nausea or Vomiting 09/21/21   Apolonio Schneiders, APRN - NP   albuterol sulfate HFA (PROVENTIL;VENTOLIN;PROAIR) 108 (90 Base) MCG/ACT inhaler Inhale 2 puffs into the lungs every 6 hours as needed for Wheezing 06/07/21   Algis Downs, MD   dicyclomine (BENTYL) 20 MG tablet Take 1 tablet by mouth 4 times daily as needed (abdominal pain) 06/01/21   Rejeana Brock, APRN - NP   ondansetron (ZOFRAN) 4 MG tablet Take 1 tablet by mouth every 8 hours as needed for Nausea or Vomiting 06/01/21  Rejeana Brock, APRN - NP   haloperidol (HALDOL) 5 MG tablet Take 1 tablet by mouth 4 times daily as needed (abdominal pain) 05/09/21   Jaynie Collins, MD   pantoprazole (PROTONIX) 40 MG tablet Take 1 tablet by mouth 2 times daily (before meals) 05/09/21   Jaynie Collins, MD   albuterol (ACCUNEB) 0.63 MG/3ML nebulizer solution Inhale 0.63 mg into the lungs every 6 hours as needed    Ar Automatic Reconciliation       Allergies:  No Known Allergies      Physical Exam:   Visit Vitals  BP (!) 153/96   Pulse 98   Temp 98 F (36.7 C) (Oral)   Resp 18   Ht 5\' 5"  (1.651 m)   Wt 202 lb 13.2 oz (92 kg)   SpO2 100%   BMI 33.75 kg/m       Physical Exam:  General appearance: asleep first, alert and cooperative after being woken up, no distress, appears stated age, comfortable at rest.   Head: Normocephalic, without obvious abnormality, atraumatic  Neck: supple, trachea midline  Lungs: clear to auscultation bilaterally  Heart: regular rate and rhythm, S1, S2 normal, no murmur, click, rub or gallop  Abdomen: soft, moderate tenderness to mild palpation of epigastric area. Bowel sounds normal. No masses,  no organomegaly  Extremities: extremities normal, atraumatic, no cyanosis or edema  Skin: Skin color,  texture, turgor normal. No rashes or lesions  Neurologic: Grossly normal      Intake and Output:  Current Shift:  No intake/output data recorded.  Last three shifts:  No intake/output data recorded.    Lab/Data Reviewed:  Lab:   Recent Results (from the past 12 hour(s))   Lipase    Collection Time: 10/05/21  1:10 AM   Result Value Ref Range    Lipase 481 (H) 12 - 53 U/L   CMP    Collection Time: 10/05/21  1:10 AM   Result Value Ref Range    Potassium 3.8 3.5 - 5.1 mEq/L    Chloride 106 98 - 107 mEq/L    Sodium 139 136 - 145 mEq/L    CO2 22 20 - 31 mEq/L    Glucose 123 (H) 74 - 106 mg/dl    BUN <5 (L) 9 - 23 mg/dl    Creatinine 10/07/21 (L) 0.55 - 1.02 mg/dl    GFR African American >60.0      GFR Non-African American >60      Calcium 9.4 8.7 - 10.4 mg/dl    Anion Gap 11 5 - 15 mmol/L    AST 18.0 0.0 - 33.9 U/L    ALT 10 10 - 49 U/L    Alkaline Phosphatase 61 46 - 116 U/L    Total Bilirubin 0.40 0.30 - 1.20 mg/dl    Total Protein 7.4 5.7 - 8.2 gm/dl    Albumin 4.2 3.4 - 5.0 gm/dl   CBC with Auto Differential    Collection Time: 10/05/21  1:10 AM   Result Value Ref Range    WBC 10.8 4.0 - 11.0 1000/mm3    RBC 4.12 3.60 - 5.20 M/uL    Hemoglobin 10.3 (L) 11.0 - 16.0 gm/dl    Hematocrit 10/07/21 (L) 35.0 - 47.0 %    MCV 73.8 (L) 80.0 - 98.0 fL    MCH 25.0 (L) 25.4 - 34.6 pg    MCHC 33.9 30.0 - 36.0 gm/dl    Platelets 44.6 286 - 450 1000/mm3  MPV 11.0 (H) 6.0 - 10.0 fL    RDW 45.7 36.4 - 46.3      Nucleated RBCs 0 0 - 0      Immature Granulocytes 0.4 0.0 - 3.0 %    Neutrophils Segmented 87.8 (H) 34 - 64 %    Lymphocytes 7.0 (L) 28 - 48 %    Monocytes 4.6 1 - 13 %    Eosinophils 0.0 0 - 5 %    Basophils 0.2 0 - 3 %       Rosalene Billings, MD  October 05, 2021

## 2021-10-05 NOTE — ED Provider Notes (Signed)
[facility] Care  Emergency Department Treatment Report  Patient: Rhonda Hammond Age: 31 y.o. Sex: female    Date of Birth: [birth year] Admit Date: 10/05/2021 PCP: None None   MRN: [id number]  CSN: [id number]  Attending: Dr. Fonda Kinder, DO    Room: ER34/ER34 Time Dictated: 3:59 AM APP: Blima Rich, MPA, PA-C     (Note to patient:The purpose of this note is to communicate optimally with the other providers involved in your care. It is written using standard medical terminology. The Dragon dictation tool was used in preparation of this note, so please excuse typos/grammatical errors.)    Chief Complaint      Chief Complaint   Patient presents with    Abdominal Pain       History of Present Illness   31 y.o. female who presents to the ED with chronic pancreatitis, chronic abdominal pain, cocaine abuse, marijuana abuse, opiate use disorder who presents to the emergency room with complaints of 3 days of epigastric abdominal pain that she states is consistent with her pancreatitis.  She states she usually takes Percocet for this pain but she has been out of it.  She states she tried to call her pain management doctor but they will call her back.  She endorses nausea and vomiting with this.  Patient states that she stops drinking alcohol and stop smoking marijuana.    RECORDS REVIEWED: I reviewed the patient's previous records here at [facility] and available outside facilities and note that patient has already been seen for this twice in the last 24 hours at [facility] as well at [facility].  He was also seen on September 17 at [facility] for the same thing.  Most recent lipase was drawn yesterday at 1245 and was 175  Related to Lipase  Component 10/04/21 10/02/21 09/20/21 09/18/21 09/11/21 09/08/21   Lipase 175 High  23 71 High  52 29 25     Her most recent urine drug screen from 10/02/21 days ago shows positive cocaine, cannabinoids, oxycodone.    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 She does not have any smokeless tobacco history on file. She reports current alcohol use of about 21.0 standard drinks of alcohol per week. She reports current drug use. Drug: Marijuana Sheran Fava).     Current Medications     Prior to Admission medications    Medication Sig Start Date End Date Taking? Authorizing Provider   ondansetron [facility]) 4  MG tablet Take 1 tablet by mouth every 8 hours as needed for Nausea or Vomiting 09/21/21   Apolonio SchneidersJanelle E Clarke, APRN - NP   albuterol sulfate HFA (PROVENTIL;VENTOLIN;PROAIR) 108 (90 Base) MCG/ACT inhaler Inhale 2 puffs into the lungs every 6 hours as needed for Wheezing 06/07/21   Algis DownsMichael E Stull, MD   dicyclomine (BENTYL) 20 MG tablet Take 1 tablet by mouth 4 times daily as needed (abdominal pain) 06/01/21   Rejeana BrockLouellen G Klein, APRN - NP   ondansetron (ZOFRAN) 4 MG tablet Take 1 tablet by mouth every 8 hours as needed for Nausea or Vomiting 06/01/21   Rejeana BrockLouellen G Klein, APRN - NP   haloperidol (HALDOL) 5 MG tablet Take 1 tablet by mouth 4 times daily as needed (abdominal pain) 05/09/21   Jaynie CollinsJames K Palma, MD   pantoprazole (PROTONIX) 40 MG tablet Take 1 tablet by mouth 2 times daily (before meals) 05/09/21   Jaynie CollinsJames K Palma, MD   albuterol (ACCUNEB) 0.63 MG/3ML nebulizer  solution Inhale 0.63 mg into the lungs every 6 hours as needed    Ar Automatic Reconciliation        Allergies   No Known Allergies    Physical Exam     ED TRIAGE VITALS    ED Triage Vitals   BP Temp Temp Source Pulse Respirations SpO2 Height Weight - Scale   10/05/21 0054 10/05/21 0054 10/05/21 0100 10/05/21 0054 10/05/21 0054 10/05/21 0054 10/05/21 0054 10/05/21 0054   (!) 160/110 98.3 F (36.8 C) Oral 98 18 95 % 1.651 m 92 kg        Physical Exam  Vitals and nursing note reviewed.   Constitutional:       General: She is in acute distress.      Appearance: She is obese.   HENT:      Head: Normocephalic and atraumatic.      Right Ear: External ear normal.      Left Ear: External ear normal.      Nose: Nose normal.      Mouth/Throat:      Mouth: Mucous membranes are moist.   Eyes:      Extraocular Movements: Extraocular movements intact.      Conjunctiva/sclera: Conjunctivae normal.   Cardiovascular:      Rate and Rhythm: Normal rate and regular rhythm.   Pulmonary:      Effort: Pulmonary effort is normal. No respiratory distress.   Abdominal:      General: There is no distension.      Palpations: Abdomen is soft.      Tenderness: There is abdominal tenderness in the epigastric area.   Musculoskeletal:         General: No signs of injury.      Cervical back: Normal range of motion.   Skin:     General: Skin is warm and dry.      Capillary Refill: Capillary refill takes less than 2 seconds.   Neurological:      General: No focal deficit present.      Mental Status: She is alert.   Psychiatric:         Mood and Affect: Mood is anxious. Affect is tearful.          Impression and Management Plan   31 y.o. female presents with chronic pancreatitis who is here complaining of abdominal pain.  We will check a lipase and treat with Pepcid, Bentyl, and Toradol.    Pancreatitis, gastroparesis, peptic ulcer disease,  narcotic withdrawal, CHS. Considered but less likely diverticulitis. Low clinical suspicion for aortic  dissection or other vascular catastrophe.    Further testing and management based on pts response and evolution of sxms.     Diagnostic Studies   Lab:   Recent Results (from the past 24 hour(s))   Lipase    Collection Time: 10/05/21  1:10 AM   Result Value Ref Range    Lipase 481 (H) 12 - 53 U/L   CMP    Collection Time: 10/05/21  1:10 AM   Result Value Ref Range    Potassium 3.8 3.5 - 5.1 mEq/L    Chloride 106 98 - 107 mEq/L    Sodium 139 136 - 145 mEq/L    CO2 22 20 - 31 mEq/L    Glucose 123 (H) 74 - 106 mg/dl    BUN <5 (L) 9 - 23 mg/dl    Creatinine 7.10 (L) 0.55 - 1.02 mg/dl    GFR African American >60.0      GFR Non-African American >60      Calcium 9.4 8.7 - 10.4 mg/dl    Anion Gap 11 5 - 15 mmol/L    AST 18.0 0.0 - 33.9 U/L    ALT 10 10 - 49 U/L    Alkaline Phosphatase 61 46 - 116 U/L    Total Bilirubin 0.40 0.30 - 1.20 mg/dl    Total Protein 7.4 5.7 - 8.2 gm/dl    Albumin 4.2 3.4 - 5.0 gm/dl   CBC with Auto Differential    Collection Time: 10/05/21  1:10 AM   Result Value Ref Range    WBC 10.8 4.0 - 11.0 1000/mm3    RBC 4.12 3.60 - 5.20 M/uL    Hemoglobin 10.3 (L) 11.0 - 16.0 gm/dl    Hematocrit 62.6 (L) 35.0 - 47.0 %    MCV 73.8 (L) 80.0 - 98.0 fL    MCH 25.0 (L) 25.4 - 34.6 pg    MCHC 33.9 30.0 - 36.0 gm/dl    Platelets 948 546 - 450 1000/mm3    MPV 11.0 (H) 6.0 - 10.0 fL    RDW 45.7 36.4 - 46.3      Nucleated RBCs 0 0 - 0      Immature Granulocytes 0.4 0.0 - 3.0 %    Neutrophils Segmented 87.8 (H) 34 - 64 %    Lymphocytes 7.0 (L) 28 - 48 %    Monocytes 4.6 1 - 13 %    Eosinophils 0.0 0 - 5 %    Basophils 0.2 0 - 3 %        ED Course     Patient Vitals for the past 24 hrs:   BP Temp Temp src Pulse Resp SpO2 Height Weight   10/05/21 0101 (!) 153/96 -- -- -- -- 100 % -- --   10/05/21 0100 -- 98 F (36.7 C) Oral -- -- 99 % -- --   10/05/21 0059 (!) 143/102 -- -- -- -- 100 % -- --   10/05/21 0054 (!) 160/110 98.3 F (36.8 C) -- 98 18 95 % 1.651 m 92 kg       ED Course as of 10/05/21 0359   Wed Oct 05, 2021    0059 PMP reviewed.  Patient had 42 tablets of Percocet filled on September 7.  On August 25 she had 28 tablets of tramadol 50 mg.  On August 17 she had 20 tablets of Percocet 5/325.  [SH]  0300 EDIE reviewed.    E.D. Visit Count (12 mo.)  Facility Visits  [facility] [address] - Emergency Care 1  Total 60 [SH]      ED Course User Index  [SH] Hilton Sinclair, PA-C       Medications   sodium chloride 0.9 % bolus 1,000 mL (1,000 mLs IntraVENous New Bag 10/05/21 0236)   0.9 % sodium chloride infusion (has no administration in time range)   famotidine (PEPCID) 20 mg in sodium chloride (PF) 0.9 % 10 mL injection (20 mg IntraVENous Given 10/05/21 0145)   dicyclomine (BENTYL) injection 20 mg (20 mg IntraMUSCular Given 10/05/21 0145)   ketorolac (TORADOL) injection 30 mg (30 mg IntraVENous Given 10/05/21 0145)   morphine injection 4 mg (4 mg IntraVENous Given 10/05/21 0317)   ondansetron (ZOFRAN) injection 4 mg (4 mg IntraVENous Given 10/05/21 0317)       Procedures      Severe exacerbation or progression of chronic illness: Chronic abdominal pain, chronic pancreatitis    Threat to body function without evaluation and management: GI    SOCIAL DETERMINANTS impacting Evaluation and Management:     -  Access to provider given after hours of operation of offices.      -  Patient's care has been impacted by health system's issues including ED overcrowding, boarding, and functioning over capacity.    -  Cocaine use, chronic opioid use, cannabinoid use    -  health literacy    -  lack of PCP    Comorbidities impacting Evaluation and Management: Cocaine use, chronic opioid use, cannabinoid use, drug-seeking behavior    Critical Care Time (if necessary) any critical care time if necessary will be documented by my attending physician    Medical Decision Making   NARRATIVE:  31 y.o. female who presents to the ED with cc of acute on  chronic abdominal pain with associated nausea and vomiting that patient states is similar to her previous pancreatitis. I have spoken to Dr. Dorthy Cooler who knows the patient well regarding this patient's care and we discussed patient's history, clinical presentation, physical exam, and my initial diagnosis and plan for care management.  Patient was medicated with nonnarcotic medications with Pepcid, Toradol and Bentyl while we awaited labs.  Immediately after the patient received her medications she started asking the nurse for narcotic pain medicine.  Her lipase did come back elevated, over 400 which represents a quadrupling of where it was less than 24 hours ago.  CBC does not show evidence of infection, CMP shows normal renal function and normal hepatic function.  Patient was medicated with 4 mg of morphine and Zofran.  Upon reassessment, the patient is sleeping in the bed.  When she was finally awaken, she states that her pain is again intractable not improved.  This point, the patient requested admission.  Hospitalist is paged for admission; spoke with Dr. Gaynelle Arabian who agreed to admit to medical observation.  He requested an ultrasound of the pancreas.    Final Diagnosis     Final diagnoses:   Acute on chronic pancreatitis (HCC)   Chronic alcoholic pancreatitis (HCC)   Intractable nausea and vomiting   Chronic abdominal pain          Disposition   admit       Medication List        ASK your doctor about these  medications      * albuterol 0.63 MG/3ML nebulizer solution  Commonly known as: ACCUNEB     * albuterol sulfate HFA 108 (90 Base) MCG/ACT inhaler  Commonly known as: PROVENTIL;VENTOLIN;PROAIR  Inhale 2 puffs into the lungs every 6 hours as needed for Wheezing     dicyclomine 20 MG tablet  Commonly known as: BENTYL  Take 1 tablet by mouth 4 times daily as needed (abdominal pain)     haloperidol 5 MG tablet  Commonly known as: HALDOL  Take 1 tablet by mouth 4 times daily as needed (abdominal pain)     *  ondansetron 4 MG tablet  Commonly known as: Zofran  Take 1 tablet by mouth every 8 hours as needed for Nausea or Vomiting     * ondansetron 4 MG tablet  Commonly known as: Zofran  Take 1 tablet by mouth every 8 hours as needed for Nausea or Vomiting     pantoprazole 40 MG tablet  Commonly known as: PROTONIX  Take 1 tablet by mouth 2 times daily (before meals)           * This list has 4 medication(s) that are the same as other medications prescribed for you. Read the directions carefully, and ask your doctor or other care provider to review them with you.                  Type(s) of Personal Protection Equipment (PPE) worn during exam:  gloves    Blima Rich, MPA, PA-C  10/05/2021  3:59 AM    The patient was personally evaluated by myself and d/w Dr. Fonda Kinder, DO who agrees with the above assessment and plan.    My signature above authenticates this document and my orders, the final diagnosis(es), discharge prescription (s), and instructions in the Epic record. If you have any questions please contact [phone].     Nursing notes have been reviewed by the Physician/Advanced Practice Clinician. Dragon medical dictation software was used for portions of this report. Unintended voice recognition errors may occur.               Blima Rich, PA-C  10/05/21 0400

## 2021-10-05 NOTE — ED Notes (Signed)
pt medicated per MAR . medications verified and checked . patient ID and allergies verified with patient and cross checked with ID band prior to administration . will continue to monitor and reasses . patient verbalized understanding of medication...       Margorie John, RN  10/05/21 [phone]

## 2021-10-05 NOTE — ED Notes (Signed)
as this Probation officer was transporting pt, pt requested pain meds and fell asleep mid-sentence, then woke up.    primary nurse made aware       Margorie John, RN  10/05/21 [phone]

## 2021-10-05 NOTE — Care Coordination-Inpatient (Signed)
10/05/21 1509   Service Assessment   Patient Orientation Alert and Oriented;Person;Place;Situation;Self   Cognition Alert   History Provided By Patient   Primary Caregiver Self   Support Systems Family Members   PCP Verified by CM No   Prior Functional Level Independent in ADLs/IADLs   Current Functional Level Independent in ADLs/IADLs   Can patient return to prior living arrangement Yes   Ability to make needs known: Good   Family able to assist with home care needs: Yes   Would you like for me to discuss the discharge plan with any other family members/significant others, and if so, who? Yes   Social/Functional History   Lives With Family   Type of [place] One level   ADL Assistance Independent   Psychologist, prison and probation services Yes   Discharge Planning   Type of Residence Apartment   Living Arrangements Spouse/Significant Other   Potential Assistance Purchasing Medications No   Condition of Participation: Discharge [place] for Transition of Care is related to the following treatment goals: (S)  DC  home

## 2021-10-06 LAB — CBC WITH AUTO DIFFERENTIAL
Basophils: 0.2 % (ref 0–3)
Eosinophils: 0.3 % (ref 0–5)
Hematocrit: 25.4 % — ABNORMAL LOW (ref 35.0–47.0)
Hemoglobin: 8.8 gm/dl — ABNORMAL LOW (ref 11.0–16.0)
Immature Granulocytes: 0.3 % (ref 0.0–3.0)
Lymphocytes: 15.9 % — ABNORMAL LOW (ref 28–48)
MCH: 25.1 pg — ABNORMAL LOW (ref 25.4–34.6)
MCHC: 34.6 gm/dl (ref 30.0–36.0)
MCV: 72.6 fL — ABNORMAL LOW (ref 80.0–98.0)
MPV: 10.2 fL — ABNORMAL HIGH (ref 6.0–10.0)
Monocytes: 9.2 % (ref 1–13)
Neutrophils Segmented: 74.1 % — ABNORMAL HIGH (ref 34–64)
Nucleated RBCs: 0 (ref 0–0)
Platelets: 180 10*3/uL (ref 140–450)
RBC: 3.5 M/uL — ABNORMAL LOW (ref 3.60–5.20)
RDW: 44.8 (ref 36.4–46.3)
WBC: 11 10*3/uL (ref 4.0–11.0)

## 2021-10-06 LAB — BASIC METABOLIC PANEL W/ REFLEX TO MG FOR LOW K
Anion Gap: 12 mmol/L (ref 5–15)
BUN: 6 mg/dl — ABNORMAL LOW (ref 9–23)
CO2: 21 mEq/L (ref 20–31)
Calcium: 9.1 mg/dl (ref 8.7–10.4)
Chloride: 106 mEq/L (ref 98–107)
Creatinine: 0.5 mg/dl — ABNORMAL LOW (ref 0.55–1.02)
Glucose: 97 mg/dl (ref 74–106)
Potassium: 3.5 mEq/L (ref 3.5–5.1)
Sodium: 139 mEq/L (ref 136–145)

## 2021-10-06 LAB — LIPASE: Lipase: 317 U/L — ABNORMAL HIGH (ref 12–53)

## 2021-10-06 MED ORDER — HYDROMORPHONE HCL 1 MG/ML IJ SOLN
1 | INTRAMUSCULAR | Status: DC | PRN
Start: 2021-10-06 — End: 2021-10-12
  Administered 2021-10-06 – 2021-10-10 (×26): 1 mg via INTRAVENOUS

## 2021-10-06 MED FILL — MORPHINE SULFATE 2 MG/ML IJ SOLN: 2 mg/mL | INTRAMUSCULAR | Qty: 1 | Fill #0

## 2021-10-06 MED FILL — HALOPERIDOL 5 MG PO TABS: 5 MG | ORAL | Qty: 1

## 2021-10-06 MED FILL — ONDANSETRON HCL 4 MG/2ML IJ SOLN: 4 MG/2ML | INTRAMUSCULAR | Qty: 2

## 2021-10-06 MED FILL — ACETAMINOPHEN 325 MG PO TABS: 325 MG | ORAL | Qty: 2

## 2021-10-06 MED FILL — MORPHINE SULFATE 2 MG/ML IJ SOLN: 2 mg/mL | INTRAMUSCULAR | Qty: 1

## 2021-10-06 MED FILL — HYDROMORPHONE HCL 1 MG/ML IJ SOLN: 1 MG/ML | INTRAMUSCULAR | Qty: 1

## 2021-10-06 MED FILL — SODIUM CHLORIDE FLUSH 0.9 % IV SOLN: 0.9 % | INTRAVENOUS | Qty: 10

## 2021-10-06 MED FILL — FAMOTIDINE (PF) 20 MG/2ML IV SOLN: 20 MG/2ML | INTRAVENOUS | Qty: 2

## 2021-10-06 NOTE — Progress Notes (Signed)
No appt needed for [facility].  Pt stated has PCP Dr. Berneta Levins and will schedule hospital follow up.

## 2021-10-06 NOTE — Progress Notes (Signed)
HOSPITALIST PROGRESS NOTE    Patient: Rhonda Hammond Age: 31 y.o. Sex: female    Date of Birth: 24-Mar-1990 Admit Date: 10/05/2021 PCP: None None   MRN: [id number]  CSN: [id number]       Daily Progress Note: 10/06/2021    Pt with Hx of polysubstance dependence/EtOH and Hx pancreatitis admitted with acute pancreatitis. Pt still CO severe pain    Assessment/Plan:     Acute pancreatitis. NPO FU lipase trends. May have ice chips today. Continue IVF and PRN pain meds  Intractable N/V improved  Chronic abd pain. Likely chronic pancreatitis  Hx pancreatic pseudocyst  Hx polysubstance dependence      Diet: NPO. May have ice chips  DVT ppx: subQ heparin       Subjective:       Review of Systems   Constitutional:  Positive for fatigue.   HENT: Negative.     Respiratory: Negative.     Gastrointestinal:  Positive for abdominal pain.   Endocrine: Negative.    Genitourinary: Negative.    Musculoskeletal: Negative.    Skin: Negative.    Neurological: Negative.    Hematological: Negative.          Objective:     Physical Exam:     BP (!) 144/81   Pulse 70   Temp 97.5 F (36.4 C) (Temporal)   Resp 16   Ht 5\' 5"  (1.651 m)   Wt 202 lb 13.2 oz (92 kg)   SpO2 98%   BMI 33.75 kg/m         Temp (24hrs), Avg:97.5 F (36.4 C), Min:97 F (36.1 C), Max:98.2 F (36.8 C)    No intake/output data recorded.   No intake/output data recorded.    Physical Exam  Nursing note reviewed.   Constitutional:       General: She is not in acute distress.  HENT:      Head: Normocephalic.   Eyes:      Pupils: Pupils are equal, round, and reactive to light.   Cardiovascular:      Rate and Rhythm: Normal rate and regular rhythm.   Pulmonary:      Effort: Pulmonary effort is normal.      Breath sounds: Normal breath sounds.   Abdominal:      General: Bowel sounds are normal.      Tenderness: There is abdominal tenderness.   Musculoskeletal:         General: Normal range of motion.      Cervical back: Normal range of motion.   Skin:     General: Skin  is warm.   Neurological:      General: No focal deficit present.      Mental Status: She is alert.   Psychiatric:         Mood and Affect: Mood normal.          Data Review:       24 Hour Results:  Recent Results (from the past 24 hour(s))   Basic Metabolic Panel w/ Reflex to MG    Collection Time: 10/06/21  4:27 AM   Result Value Ref Range    Potassium 3.5 3.5 - 5.1 mEq/L    Chloride 106 98 - 107 mEq/L    Sodium 139 136 - 145 mEq/L    CO2 21 20 - 31 mEq/L    Glucose 97 74 - 106 mg/dl    BUN 6 (L) 9 - 23 mg/dl    Creatinine 10/08/21 (  L) 0.55 - 1.02 mg/dl    Calcium 9.1 8.7 - 10.4 mg/dl    Anion Gap 12 5 - 15 mmol/L   CBC with Auto Differential    Collection Time: 10/06/21  4:27 AM   Result Value Ref Range    WBC 11.0 4.0 - 11.0 1000/mm3    RBC 3.50 (L) 3.60 - 5.20 M/uL    Hemoglobin 8.8 (L) 11.0 - 16.0 gm/dl    Hematocrit 25.4 (L) 35.0 - 47.0 %    MCV 72.6 (L) 80.0 - 98.0 fL    MCH 25.1 (L) 25.4 - 34.6 pg    MCHC 34.6 30.0 - 36.0 gm/dl    Platelets 180 140 - 450 1000/mm3    MPV 10.2 (H) 6.0 - 10.0 fL    RDW 44.8 36.4 - 46.3      Nucleated RBCs 0 0 - 0      Immature Granulocytes 0.3 0.0 - 3.0 %    Neutrophils Segmented 74.1 (H) 34 - 64 %    Lymphocytes 15.9 (L) 28 - 48 %    Monocytes 9.2 1 - 13 %    Eosinophils 0.3 0 - 5 %    Basophils 0.2 0 - 3 %   Lipase    Collection Time: 10/06/21 10:16 AM   Result Value Ref Range    Lipase 317 (H) 12 - 53 U/L       Problem List:       Medications reviewed  Current Facility-Administered Medications   Medication Dose Route Frequency    HYDROmorphone (DILAUDID) injection 1 mg  1 mg IntraVENous Q4H PRN    0.9 % sodium chloride infusion   IntraVENous Once    ondansetron (ZOFRAN) injection 4 mg  4 mg IntraVENous Q6H PRN    famotidine (PEPCID) 20 mg in sodium chloride (PF) 0.9 % 10 mL injection  20 mg IntraVENous BID    dicyclomine (BENTYL) capsule 20 mg  20 mg Oral 4x Daily PRN    haloperidol (HALDOL) tablet 5 mg  5 mg Oral 4x Daily PRN    sodium chloride flush 0.9 % injection 5-40 mL   5-40 mL IntraVENous 2 times per day    sodium chloride flush 0.9 % injection 5-40 mL  5-40 mL IntraVENous PRN    0.9 % sodium chloride infusion   IntraVENous PRN    polyethylene glycol (GLYCOLAX) packet 17 g  17 g Oral Daily PRN    acetaminophen (TYLENOL) tablet 650 mg  650 mg Oral Q6H PRN    Or    acetaminophen (TYLENOL) suppository 650 mg  650 mg Rectal Q6H PRN        Care Plan discussed with: Patient/Family, Nurse, and Case Manager    Total clinical care time was 55 minutes of which more than 50% was spent in coordination of care and counseling (time spent with patient/family face to face, physical exam, reviewing laboratory and imaging investigations, speaking with physicians and nursing staff involved in this patient's care).        Norberta Stobaugh Desma Paganini, MD  October 06, 2021  12:41 PM

## 2021-10-07 ENCOUNTER — Inpatient Hospital Stay: Admit: 2021-10-07 | Payer: PRIVATE HEALTH INSURANCE

## 2021-10-07 LAB — CBC WITH AUTO DIFFERENTIAL
Basophils: 0.1 % (ref 0–3)
Eosinophils: 0.8 % (ref 0–5)
Hematocrit: 25.3 % — ABNORMAL LOW (ref 35.0–47.0)
Hemoglobin: 8.8 gm/dl — ABNORMAL LOW (ref 11.0–16.0)
Immature Granulocytes: 0.2 % (ref 0.0–3.0)
Lymphocytes: 19.2 % — ABNORMAL LOW (ref 28–48)
MCH: 25.5 pg (ref 25.4–34.6)
MCHC: 34.8 gm/dl (ref 30.0–36.0)
MCV: 73.3 fL — ABNORMAL LOW (ref 80.0–98.0)
MPV: 10.8 fL — ABNORMAL HIGH (ref 6.0–10.0)
Monocytes: 8 % (ref 1–13)
Neutrophils Segmented: 71.7 % — ABNORMAL HIGH (ref 34–64)
Nucleated RBCs: 0 (ref 0–0)
Platelets: 174 10*3/uL (ref 140–450)
RBC: 3.45 M/uL — ABNORMAL LOW (ref 3.60–5.20)
RDW: 44.6 (ref 36.4–46.3)
WBC: 8.9 10*3/uL (ref 4.0–11.0)

## 2021-10-07 LAB — COMPREHENSIVE METABOLIC PANEL
ALT: 7 U/L — ABNORMAL LOW (ref 10–49)
AST: 15 U/L (ref 0.0–33.9)
Albumin: 3.9 gm/dl (ref 3.4–5.0)
Alkaline Phosphatase: 53 U/L (ref 46–116)
Anion Gap: 12 mmol/L (ref 5–15)
BUN: 6 mg/dl — ABNORMAL LOW (ref 9–23)
CO2: 22 mEq/L (ref 20–31)
Calcium: 9.1 mg/dl (ref 8.7–10.4)
Chloride: 104 mEq/L (ref 98–107)
Creatinine: 0.43 mg/dl — ABNORMAL LOW (ref 0.55–1.02)
GFR African American: >60
GFR Non-African American: >60
Glucose: 73 mg/dl — ABNORMAL LOW (ref 74–106)
Potassium: 3.3 mEq/L — ABNORMAL LOW (ref 3.5–5.1)
Sodium: 138 mEq/L (ref 136–145)
Total Bilirubin: 0.4 mg/dl (ref 0.30–1.20)
Total Protein: 6.9 gm/dl (ref 5.7–8.2)

## 2021-10-07 LAB — IRON AND TIBC
% SATURATION: 8 % — ABNORMAL LOW (ref 20–45)
Iron: 26 ug/dL — ABNORMAL LOW (ref 50–170)
TIBC: 346 ug/dL (ref 250–425)

## 2021-10-07 LAB — LIPASE: Lipase: 364 U/L — ABNORMAL HIGH (ref 12–53)

## 2021-10-07 LAB — FERRITIN: Ferritin: 19.6 ng/ml (ref 7.3–270.7)

## 2021-10-07 MED ORDER — LORAZEPAM 2 MG/ML IJ SOLN
2 MG/ML | Freq: Once | INTRAMUSCULAR | Status: AC
Start: 2021-10-07 — End: 2021-10-12

## 2021-10-07 MED ORDER — POTASSIUM CHLORIDE 10 MEQ/100ML IV SOLN
10 MEQ/0ML | INTRAVENOUS | Status: AC
Start: 2021-10-07 — End: 2021-10-07
  Administered 2021-10-07 (×2): 10 meq via INTRAVENOUS

## 2021-10-07 MED ORDER — POTASSIUM CHLORIDE CRYS ER 20 MEQ PO TBCR
20 MEQ | Freq: Once | ORAL | Status: AC
Start: 2021-10-07 — End: 2021-10-07
  Administered 2021-10-07: 10:00:00 40 meq via ORAL

## 2021-10-07 MED FILL — HYDROMORPHONE HCL 1 MG/ML IJ SOLN: 1 MG/ML | INTRAMUSCULAR | Qty: 1

## 2021-10-07 MED FILL — POTASSIUM CHLORIDE 10 MEQ/100ML IV SOLN: 10 MEQ/0ML | INTRAVENOUS | Qty: 100

## 2021-10-07 MED FILL — POTASSIUM CHLORIDE CRYS ER 20 MEQ PO TBCR: 20 MEQ | ORAL | Qty: 2

## 2021-10-07 NOTE — Progress Notes (Signed)
Dr Dawson Bills return call, order receive to give kdur 40 meq times x1 now

## 2021-10-07 NOTE — Plan of Care (Signed)
Problem: Pain  Goal: Verbalizes/displays adequate comfort level or baseline comfort level  Outcome: Progressing     Problem: Safety - Adult  Goal: Free from fall injury  Outcome: Progressing

## 2021-10-07 NOTE — Progress Notes (Signed)
HOSPITALIST PROGRESS NOTE    Patient: Rhonda Hammond Age: 31 y.o. Sex: female    Date of Birth: 08-Jun-1990 Admit Date: 10/05/2021 PCP: None None   MRN: [id number]  CSN: [id number]       Daily Progress Note: 10/07/2021    Pt with Hx of polysubstance dependence/EtOH and Hx pancreatitis admitted with acute pancreatitis. Pt still CO severe pain    Assessment/Plan:     Acute pancreatitis. NPO FU lipase trends. May have ice chips today. Continue IVF and PRN pain meds--will order MRCP today due to continued CO severe pain  Intractable N/V improved  Chronic abd pain. Likely chronic pancreatitis  Hx pancreatic pseudocyst  Hx polysubstance dependence      Diet: NPO. May have ice chips  DVT ppx: subQ heparin       Subjective:       Review of Systems   Constitutional:  Positive for fatigue.   HENT: Negative.     Respiratory: Negative.     Gastrointestinal:  Positive for abdominal pain.   Endocrine: Negative.    Genitourinary: Negative.    Musculoskeletal: Negative.    Skin: Negative.    Neurological: Negative.    Hematological: Negative.          Objective:     Physical Exam:     BP 139/89   Pulse 55   Temp 96.9 F (36.1 C) (Temporal)   Resp 14   Ht 5\' 5"  (1.651 m)   Wt 202 lb 13.2 oz (92 kg)   SpO2 98%   BMI 33.75 kg/m         Temp (24hrs), Avg:97.3 F (36.3 C), Min:96.8 F (36 C), Max:97.7 F (36.5 C)    No intake/output data recorded.   09/20 1901 - 09/22 0700  In: -   Out: 550 [Urine:550]    Physical Exam  Nursing note reviewed.   Constitutional:       General: She is not in acute distress.  HENT:      Head: Normocephalic.   Eyes:      Pupils: Pupils are equal, round, and reactive to light.   Cardiovascular:      Rate and Rhythm: Normal rate and regular rhythm.   Pulmonary:      Effort: Pulmonary effort is normal.      Breath sounds: Normal breath sounds.   Abdominal:      General: Bowel sounds are normal.      Tenderness: There is abdominal tenderness.   Musculoskeletal:         General: Normal range of  motion.      Cervical back: Normal range of motion.   Skin:     General: Skin is warm.   Neurological:      General: No focal deficit present.      Mental Status: She is alert.   Psychiatric:         Mood and Affect: Mood normal.          Data Review:       24 Hour Results:  Recent Results (from the past 24 hour(s))   Lipase    Collection Time: 10/07/21 12:52 AM   Result Value Ref Range    Lipase 364 (H) 12 - 53 U/L   CBC with Auto Differential    Collection Time: 10/07/21 12:52 AM   Result Value Ref Range    WBC 8.9 4.0 - 11.0 1000/mm3    RBC 3.45 (L) 3.60 - 5.20 M/uL  Hemoglobin 8.8 (L) 11.0 - 16.0 gm/dl    Hematocrit 56.4 (L) 35.0 - 47.0 %    MCV 73.3 (L) 80.0 - 98.0 fL    MCH 25.5 25.4 - 34.6 pg    MCHC 34.8 30.0 - 36.0 gm/dl    Platelets 332 951 - 450 1000/mm3    MPV 10.8 (H) 6.0 - 10.0 fL    RDW 44.6 36.4 - 46.3      Nucleated RBCs 0 0 - 0      Immature Granulocytes 0.2 0.0 - 3.0 %    Neutrophils Segmented 71.7 (H) 34 - 64 %    Lymphocytes 19.2 (L) 28 - 48 %    Monocytes 8.0 1 - 13 %    Eosinophils 0.8 0 - 5 %    Basophils 0.1 0 - 3 %   Comprehensive Metabolic Panel    Collection Time: 10/07/21 12:52 AM   Result Value Ref Range    Potassium 3.3 (L) 3.5 - 5.1 mEq/L    Chloride 104 98 - 107 mEq/L    Sodium 138 136 - 145 mEq/L    CO2 22 20 - 31 mEq/L    Glucose 73 (L) 74 - 106 mg/dl    BUN 6 (L) 9 - 23 mg/dl    Creatinine 8.84 (L) 0.55 - 1.02 mg/dl    GFR African American >60.0      GFR Non-African American >60      Calcium 9.1 8.7 - 10.4 mg/dl    Anion Gap 12 5 - 15 mmol/L    AST 15.0 0.0 - 33.9 U/L    ALT 7 (L) 10 - 49 U/L    Alkaline Phosphatase 53 46 - 116 U/L    Total Bilirubin 0.40 0.30 - 1.20 mg/dl    Total Protein 6.9 5.7 - 8.2 gm/dl    Albumin 3.9 3.4 - 5.0 gm/dl   Ferritin    Collection Time: 10/07/21 12:52 AM   Result Value Ref Range    Ferritin 19.6 7.3 - 270.7 ng/ml   Iron and TIBC    Collection Time: 10/07/21 11:18 AM   Result Value Ref Range    Iron 26 (L) 50 - 170 mcg/dl    TIBC 166 063 - 016  mcg/dl    % SATURATION 8 (L) 20 - 45 %       Problem List:       Medications reviewed  Current Facility-Administered Medications   Medication Dose Route Frequency    HYDROmorphone (DILAUDID) injection 1 mg  1 mg IntraVENous Q4H PRN    0.9 % sodium chloride infusion   IntraVENous Once    ondansetron (ZOFRAN) injection 4 mg  4 mg IntraVENous Q6H PRN    famotidine (PEPCID) 20 mg in sodium chloride (PF) 0.9 % 10 mL injection  20 mg IntraVENous BID    dicyclomine (BENTYL) capsule 20 mg  20 mg Oral 4x Daily PRN    haloperidol (HALDOL) tablet 5 mg  5 mg Oral 4x Daily PRN    sodium chloride flush 0.9 % injection 5-40 mL  5-40 mL IntraVENous 2 times per day    sodium chloride flush 0.9 % injection 5-40 mL  5-40 mL IntraVENous PRN    0.9 % sodium chloride infusion   IntraVENous PRN    polyethylene glycol (GLYCOLAX) packet 17 g  17 g Oral Daily PRN    acetaminophen (TYLENOL) tablet 650 mg  650 mg Oral Q6H PRN    Or  acetaminophen (TYLENOL) suppository 650 mg  650 mg Rectal Q6H PRN        Care Plan discussed with: Patient/Family, Nurse, and Case Manager    Total clinical care time was 55 minutes of which more than 50% was spent in coordination of care and counseling (time spent with patient/family face to face, physical exam, reviewing laboratory and imaging investigations, speaking with physicians and nursing staff involved in this patient's care).        Houa Ackert Mora Appl, MD  October 07, 2021  2:22 PM

## 2021-10-07 NOTE — Plan of Care (Signed)
Problem: Pain  Goal: Verbalizes/displays adequate comfort level or baseline comfort level  10/07/2021 0337 by Judeth Cornfield, RN  Outcome: Progressing  10/07/2021 0323 by Judeth Cornfield, RN  Outcome: Progressing     Problem: Safety - Adult  Goal: Free from fall injury  10/07/2021 0337 by Judeth Cornfield, RN  Outcome: Progressing  10/07/2021 0323 by Judeth Cornfield, RN  Outcome: Progressing

## 2021-10-07 NOTE — Progress Notes (Signed)
Rhonda Hammond room [phone] Acute pancreatitis   pancreatitis, alcohol related   Hx of pancreatic pseudocyst   Hx of Polysubstance depend, recent UTOX positive for cocaine,   current lab this a.m potassium 3.3 BuN6 creatinine 0.43  Hospitalist contacted at this time

## 2021-10-08 LAB — COMPREHENSIVE METABOLIC PANEL
ALT: <7 U/L — ABNORMAL LOW (ref 10–49)
AST: 10 U/L (ref 0.0–33.9)
Albumin: 3.5 gm/dl (ref 3.4–5.0)
Alkaline Phosphatase: 52 U/L (ref 46–116)
Anion Gap: 10 mmol/L (ref 5–15)
BUN: <5 mg/dl — ABNORMAL LOW (ref 9–23)
CO2: 22 mEq/L (ref 20–31)
Calcium: 8.9 mg/dl (ref 8.7–10.4)
Chloride: 104 mEq/L (ref 98–107)
Creatinine: 0.48 mg/dl — ABNORMAL LOW (ref 0.55–1.02)
GFR African American: >60
GFR Non-African American: >60
Glucose: 67 mg/dl — ABNORMAL LOW (ref 74–106)
Potassium: 3.7 mEq/L (ref 3.5–5.1)
Sodium: 136 mEq/L (ref 136–145)
Total Bilirubin: 0.5 mg/dl (ref 0.30–1.20)
Total Protein: 6.5 gm/dl (ref 5.7–8.2)

## 2021-10-08 LAB — CBC WITH AUTO DIFFERENTIAL
Basophils: 0.2 % (ref 0–3)
Eosinophils: 1.7 % (ref 0–5)
Hematocrit: 24.7 % — ABNORMAL LOW (ref 35.0–47.0)
Hemoglobin: 8.6 gm/dl — ABNORMAL LOW (ref 11.0–16.0)
Immature Granulocytes: 0.2 % (ref 0.0–3.0)
Lymphocytes: 20.7 % — ABNORMAL LOW (ref 28–48)
MCH: 25.5 pg (ref 25.4–34.6)
MCHC: 34.8 gm/dl (ref 30.0–36.0)
MCV: 73.3 fL — ABNORMAL LOW (ref 80.0–98.0)
MPV: 10.6 fL — ABNORMAL HIGH (ref 6.0–10.0)
Monocytes: 10.9 % (ref 1–13)
Neutrophils Segmented: 66.3 % — ABNORMAL HIGH (ref 34–64)
Nucleated RBCs: 0 (ref 0–0)
Platelets: 165 10*3/uL (ref 140–450)
RBC: 3.37 M/uL — ABNORMAL LOW (ref 3.60–5.20)
RDW: 45.8 (ref 36.4–46.3)
WBC: 5.9 10*3/uL (ref 4.0–11.0)

## 2021-10-08 LAB — POCT GLUCOSE: POC Glucose: 72 mg/dL (ref 65–105)

## 2021-10-08 LAB — LIPASE: Lipase: 107 U/L — ABNORMAL HIGH (ref 12–53)

## 2021-10-08 MED ORDER — GADOBUTROL 1 MMOL/ML IV SOLN
1 MMOL/ML | Freq: Once | INTRAVENOUS | Status: AC | PRN
Start: 2021-10-08 — End: 2021-10-07
  Administered 2021-10-08: 10 mL via INTRAVENOUS

## 2021-10-08 MED FILL — HYDROMORPHONE HCL 1 MG/ML IJ SOLN: 1 MG/ML | INTRAMUSCULAR | Qty: 1

## 2021-10-08 MED FILL — GADAVIST 1 MMOL/ML IV SOLN: 1 MMOL/ML | INTRAVENOUS | Qty: 15

## 2021-10-08 NOTE — Progress Notes (Signed)
Hospitalist Progress Note  [facility]    Daily Progress Note: 10/08/2021    Assessment/Plan:     Acute pancreatitis  Advance diet to CLD  F/up lipase trends.   Continue IVF and PRN pain meds  MRCP (9/22) whith larger 4x4.4cm pseudocyst  Still c/o severe pain and pt with freq visits to ER per records.  Will have GI eval pt regarding pseudocyst    Intractable N/V   Improved    Chronic abd pain  Likely chronic pancreatitis with acute exacerbation by pseudocyst    Hx pancreatic pseudocyst  Larger as above    Hx polysubstance dependence  Cocaine, MJ, alcohol  Recheck UDS    DVT Prophylaxis  Mobile, SCDs    Code status. Full Code    Disposition  GI to eval  Advance diet in AM    ------------------     Chart reviewed including notes, labs, imaging, studies     D/w floor RN regarding current POC and pt's condition     D/w pt and family regarding pt's condition and current POC    ------------------  Physical exam:  General: Alert, appears to be in pain.  Cooperative  HEENT: Sclera anicteric, PERRL, OM moist, throat clear.  Chest: no wheeze, no rales, no rhonchi.  Good air movement.  CV: RRR, S1/S2 were normal, no murmur  Abdomen: Diffuse tenderness, but much more in the upper abdomen in a belt across the upper abdomen, ND, soft, NABS, no masses were noted.  No rebound, no guarding.  Extremities: No edema.    Subjective:     Pt said the pain is better than on admission, but still quite bad. Discussed MRI abd with pseudocyst which is larger and that I would have GI eval    Objective:     BP 135/77   Pulse 69   Temp 97.2 F (36.2 C) (Temporal)   Resp 17   Ht 5\' 5"  (1.651 m)   Wt 202 lb 13.2 oz (92 kg)   SpO2 99%   BMI 33.75 kg/m         Temp (24hrs), Avg:97.2 F (36.2 C), Min:96.6 F (35.9 C), Max:97.9 F (36.6 C)    No intake/output data recorded.   09/22 0701 - 09/23 1900  In: 720 [P.O.:720]  Out: -       Data Review:       Recent Days:  Recent Labs     10/06/21  0427 10/07/21  0052 10/08/21  0215   WBC  11.0 8.9 5.9   HGB 8.8* 8.8* 8.6*   HCT 25.4* 25.3* 24.7*   PLT 180 174 165     Recent Labs     10/06/21  0427 10/07/21  0052 10/08/21  0215   NA 139 138 136   K 3.5 3.3* 3.7   CL 106 104 104   CO2 21 22 22    BUN 6* 6* <5*   ALT  --  7* <7*     No results for input(s): "PH", "PCO2", "PO2", "HCO3", "FIO2" in the last 72 hours.    24 Hour Results:  Recent Results (from the past 24 hour(s))   Lipase    Collection Time: 10/08/21  2:15 AM   Result Value Ref Range    Lipase 107 (H) 12 - 53 U/L   CBC with Auto Differential    Collection Time: 10/08/21  2:15 AM   Result Value Ref Range    WBC 5.9 4.0 - 11.0 1000/mm3  RBC 3.37 (L) 3.60 - 5.20 M/uL    Hemoglobin 8.6 (L) 11.0 - 16.0 gm/dl    Hematocrit 78.4 (L) 35.0 - 47.0 %    MCV 73.3 (L) 80.0 - 98.0 fL    MCH 25.5 25.4 - 34.6 pg    MCHC 34.8 30.0 - 36.0 gm/dl    Platelets 696 295 - 450 1000/mm3    MPV 10.6 (H) 6.0 - 10.0 fL    RDW 45.8 36.4 - 46.3      Nucleated RBCs 0 0 - 0      Immature Granulocytes 0.2 0.0 - 3.0 %    Neutrophils Segmented 66.3 (H) 34 - 64 %    Lymphocytes 20.7 (L) 28 - 48 %    Monocytes 10.9 1 - 13 %    Eosinophils 1.7 0 - 5 %    Basophils 0.2 0 - 3 %   Comprehensive Metabolic Panel    Collection Time: 10/08/21  2:15 AM   Result Value Ref Range    Potassium 3.7 3.5 - 5.1 mEq/L    Chloride 104 98 - 107 mEq/L    Sodium 136 136 - 145 mEq/L    CO2 22 20 - 31 mEq/L    Glucose 67 (L) 74 - 106 mg/dl    BUN <5 (L) 9 - 23 mg/dl    Creatinine 2.84 (L) 0.55 - 1.02 mg/dl    GFR African American >60.0      GFR Non-African American >60      Calcium 8.9 8.7 - 10.4 mg/dl    Anion Gap 10 5 - 15 mmol/L    AST 10.0 0.0 - 33.9 U/L    ALT <7 (L) 10 - 49 U/L    Alkaline Phosphatase 52 46 - 116 U/L    Total Bilirubin 0.50 0.30 - 1.20 mg/dl    Total Protein 6.5 5.7 - 8.2 gm/dl    Albumin 3.5 3.4 - 5.0 gm/dl   POCT Glucose    Collection Time: 10/08/21  8:36 AM   Result Value Ref Range    POC Glucose 72 65 - 105 mg/dL       MRI ABDOMEN W CONTRAST MRCP    Result Date:  10/07/2021  EXAM:  MRI abdomen with MRCP with and without contrast INDICATIONS: Pancreatitis TECHNIQUE: Multisequence, multiplanar MRI of the abdomen with and without contrast, including 3D MRCP sequences. 3d MRCP images were performed on the scanner and submitted for review. COMPARISON: 04/23/2021. WORKSTATION ID: [id number] FINDINGS: Cholecystectomy. No significant intrahepatic or extrahepatic biliary ductal dilatation. CBD measures up to 5 mm. Liver, spleen, adrenal glands and kidneys grossly unremarkable. Enlarging 4.4 x 4.0 cm pancreatic head cystic lesion with thick T2 hypointense surrounding rim. There appears to be some debris within the inferior aspect of the cystic lesion. Pancreatic ductal dilation dilatation, particularly about the head measuring up to 5 mm, which may be due to mass effect from the cystic lesion versus stricture in the setting of chronic pancreatitis. No significant peripancreatic inflammatory changes to suggest acute pancreatitis. Visualized bowel loops grossly unremarkable. No significant lymphadenopathy. Incomplete characterization of left ovarian cystic lesion, probably reflecting a dominant follicle versus cyst, measuring approximately 2.4 cm. Small, likely physiologic quantity of free pelvic fluid.     IMPRESSION: 1. Enlarging cystic pancreatic head lesion, favoring pancreatic pseudocyst. Other pancreatic cystic neoplasm is not excluded. Continued surveillance is recommended. May consider endoscopic ultrasound. 2. Pancreatic ductal dilatation, most pronounced within the pancreatic head measuring up to 5 mm. This may  be due to mild mass effect from the cystic lesion versus stricture as a result of chronic pancreatitis. 3. No MRI evidence of acute pancreatitis. 4. Cholecystectomy. Electronically signed by: Elnita Maxwell, MD 10/07/2021 8:54 PM EDT          Workstation ID: [id number]     US ABDOMEN LIMITED Specify organ? PANCREAS    Result Date: 10/05/2021  EXAMINATION: US ABDOMEN  LIMITED CLINICAL INDICATION: intractable pain COMPARISON:  CT abdomen 04/23/2021;  TECHNIQUE: Abdominal ultrasound FINDINGS: Liver 16.9 cm. Increased echotexture. No discrete masses. No intrahepatic biliary duct dilatation. Gallbladder has been removed. Negative sonographic Murphy's sign. Common bile duct 5 mm. Pancreatic head cystic lesion 3.0 x 3.2 x 2.9 cm.  Pancreatic tail   partially obscured and cannot be evaluated. Proximal aorta and IVC Unremarkable. Right kidney 11.7 x 4.6 x 5.0 cm. Normal echotexture.     IMPRESSION: 1.  Pancreatic head cystic lesion. 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 Recommend follow-up evaluation by MRI with and without contrast. 2. Fatty liver. 3. Cholecystectomy. Electronically signed by: Javier Glazier, MD 10/05/2021 4:45 AM EDT          Workstation ID: [id number]       Microbiology:  @MICRORESULTS @     Cardiology:  @LASTCARDIOLOGY @      Problem List:      Medications reviewed  Current Facility-Administered Medications   Medication Dose Route Frequency    LORazepam (ATIVAN) injection 1 mg  1 mg IntraVENous Once    HYDROmorphone (DILAUDID) injection 1 mg  1 mg IntraVENous Q4H PRN    0.9 % sodium chloride infusion   IntraVENous Once    ondansetron (ZOFRAN) injection 4 mg  4 mg IntraVENous Q6H PRN    famotidine (PEPCID) 20 mg in sodium chloride (PF) 0.9 % 10 mL injection  20 mg IntraVENous BID    dicyclomine (BENTYL) capsule 20 mg  20 mg Oral 4x Daily PRN    haloperidol (HALDOL) tablet 5 mg  5 mg Oral 4x Daily PRN    sodium chloride flush 0.9 % injection 5-40 mL  5-40 mL IntraVENous 2 times per day    sodium chloride flush 0.9 % injection 5-40 mL  5-40 mL IntraVENous PRN    0.9 % sodium chloride infusion   IntraVENous PRN    polyethylene glycol (GLYCOLAX) packet 17 g  17 g Oral Daily PRN    acetaminophen (TYLENOL) tablet 650 mg  650 mg Oral Q6H PRN    Or    acetaminophen (TYLENOL) suppository 650 mg  650 mg Rectal Q6H PRN         Kristen Loader, MD

## 2021-10-09 LAB — COMPREHENSIVE METABOLIC PANEL
ALT: 25 U/L (ref 10–49)
AST: 27 U/L (ref 0.0–33.9)
Albumin: 3.6 gm/dl (ref 3.4–5.0)
Alkaline Phosphatase: 67 U/L (ref 46–116)
Anion Gap: 8 mmol/L (ref 5–15)
BUN: <5 mg/dl — ABNORMAL LOW (ref 9–23)
CO2: 23 mEq/L (ref 20–31)
Calcium: 9.1 mg/dl (ref 8.7–10.4)
Chloride: 104 mEq/L (ref 98–107)
Creatinine: 0.56 mg/dl (ref 0.55–1.02)
GFR African American: >60
GFR Non-African American: >60
Glucose: 92 mg/dl (ref 74–106)
Potassium: 3.6 mEq/L (ref 3.5–5.1)
Sodium: 135 mEq/L — ABNORMAL LOW (ref 136–145)
Total Bilirubin: 0.4 mg/dl (ref 0.30–1.20)
Total Protein: 7.3 gm/dl (ref 5.7–8.2)

## 2021-10-09 LAB — URINE DRUG SCREEN
Amphetamine: NEGATIVE
Barbiturates, Urine: NEGATIVE
Benzodiazepines, Urine: NEGATIVE
Cocaine: NEGATIVE
Marijuana: POSITIVE — AB
Methadone Screen, Urine: NEGATIVE
Opiates, Urine: POSITIVE — AB
Phencyclidine: NEGATIVE

## 2021-10-09 LAB — CBC WITH AUTO DIFFERENTIAL
Basophils: 0.2 % (ref 0–3)
Eosinophils: 3.8 % (ref 0–5)
Hematocrit: 28.2 % — ABNORMAL LOW (ref 35.0–47.0)
Hemoglobin: 9.6 gm/dl — ABNORMAL LOW (ref 11.0–16.0)
Immature Granulocytes: 0.2 % (ref 0.0–3.0)
Lymphocytes: 24 % — ABNORMAL LOW (ref 28–48)
MCH: 25.1 pg — ABNORMAL LOW (ref 25.4–34.6)
MCHC: 34 gm/dl (ref 30.0–36.0)
MCV: 73.8 fL — ABNORMAL LOW (ref 80.0–98.0)
MPV: 10.6 fL — ABNORMAL HIGH (ref 6.0–10.0)
Monocytes: 12.7 % (ref 1–13)
Neutrophils Segmented: 59.1 % (ref 34–64)
Nucleated RBCs: 0 (ref 0–0)
Platelets: 200 10*3/uL (ref 140–450)
RBC: 3.82 M/uL (ref 3.60–5.20)
RDW: 46 (ref 36.4–46.3)
WBC: 5 10*3/uL (ref 4.0–11.0)

## 2021-10-09 LAB — POCT GLUCOSE: POC Glucose: 168 mg/dL — ABNORMAL HIGH (ref 65–105)

## 2021-10-09 MED FILL — HYDROMORPHONE HCL 1 MG/ML IJ SOLN: 1 MG/ML | INTRAMUSCULAR | Qty: 1

## 2021-10-09 NOTE — Telephone Encounter (Signed)
----------  DocumentID: [id number] (AP)-------------------------------------------              [facility]                       Patient Education Report         Name: Rhonda Hammond, Rhonda Hammond                  Date: 10/09/2021    MRN: [id number]                    Time: 9:24:21 PM         Patient ordered video: 'Patient Safety: Stay Safe While you are in the Hospital'    from [id number] via phone number: [phone] at 9:24:21 PM    Description: This program outlines some of the precautions patients can take to ensure a speedy recovery without extra complications. The video emphasizes the importance of communicating with the healthcare team.

## 2021-10-09 NOTE — Progress Notes (Signed)
Hospitalist Progress Note  [facility]    Daily Progress Note: 10/09/2021    Assessment/Plan:     Acute pancreatitis  Advance diet to CLD  F/up lipase trends.   Continue IVF and PRN pain meds  MRCP (9/22) whith larger 4x4.4cm pseudocyst  Still c/o severe pain and pt with freq visits to ER per records.  Will have GI eval pt regarding pseudocyst (spoke with Dr Emeline General)    Intractable N/V   Improved    Chronic abd pain  Likely chronic pancreatitis with acute exacerbation by pseudocyst    Hx pancreatic pseudocyst  Larger as above    Hx polysubstance dependence  Cocaine, MJ, alcohol  Recheck UDS (9/24) with MJ and opiates. Pt is getting opiates inpt.    DVT Prophylaxis  Mobile, SCDs    Code status. Full Code    Disposition  GI to eval 9/25  Advanced    ------------------     Chart reviewed including notes, labs, imaging, studies     D/w floor RN regarding current POC and pt's condition     D/w pt and family regarding pt's condition and current POC    ------------------  Physical exam:  General: Alert, appears to be in pain.  Cooperative  HEENT: Sclera anicteric, PERRL, OM moist, throat clear.  Chest: no wheeze, no rales, no rhonchi.  Good air movement.  CV: RRR, S1/S2 were normal, no murmur  Abdomen: Diffuse tenderness, but much more in the upper abdomen in a belt across the upper abdomen, ND, soft, NABS, no masses were noted.  No rebound, no guarding.  Extremities: No edema.    Subjective:     Pt said the pain is better than compared to admission, but still quite bad.     Objective:     BP 122/77   Pulse 56   Temp 97 F (36.1 C) (Temporal)   Resp 18   Ht 5\' 5"  (1.651 m)   Wt 202 lb 13.2 oz (92 kg)   SpO2 97%   BMI 33.75 kg/m         Temp (24hrs), Avg:97.6 F (36.4 C), Min:97 F (36.1 C), Max:98.2 F (36.8 C)    09/24 0701 - 09/24 1900  In: 580 [P.O.:580]  Out: -    09/22 1901 - 09/24 0700  In: 720 [P.O.:720]  Out: -       Data Review:       Recent Days:  Recent Labs     10/07/21  0052  10/08/21  0215 10/09/21  1042   WBC 8.9 5.9 5.0   HGB 8.8* 8.6* 9.6*   HCT 25.3* 24.7* 28.2*   PLT 174 165 200       Recent Labs     10/07/21  0052 10/08/21  0215 10/09/21  1042   NA 138 136 135*   K 3.3* 3.7 3.6   CL 104 104 104   CO2 22 22 23    BUN 6* <5* <5*   ALT 7* <7* 25       No results for input(s): "PH", "PCO2", "PO2", "HCO3", "FIO2" in the last 72 hours.    24 Hour Results:  Recent Results (from the past 24 hour(s))   Urine Drug Screen    Collection Time: 10/09/21 10:25 AM   Result Value Ref Range    Amphetamine NEGATIVE NEGATIVE      Barbiturates, Urine NEGATIVE NEGATIVE      Benzodiazepines, Urine NEGATIVE NEGATIVE      Cocaine  NEGATIVE NEGATIVE      Marijuana POSITIVE (A) NEGATIVE      Methadone Screen, Urine NEGATIVE NEGATIVE      Opiates, Urine POSITIVE (A) NEGATIVE      Phencyclidine NEGATIVE NEGATIVE     CBC with Auto Differential    Collection Time: 10/09/21 10:42 AM   Result Value Ref Range    WBC 5.0 4.0 - 11.0 1000/mm3    RBC 3.82 3.60 - 5.20 M/uL    Hemoglobin 9.6 (L) 11.0 - 16.0 gm/dl    Hematocrit 39.7 (L) 35.0 - 47.0 %    MCV 73.8 (L) 80.0 - 98.0 fL    MCH 25.1 (L) 25.4 - 34.6 pg    MCHC 34.0 30.0 - 36.0 gm/dl    Platelets 673 419 - 450 1000/mm3    MPV 10.6 (H) 6.0 - 10.0 fL    RDW 46.0 36.4 - 46.3      Nucleated RBCs 0 0 - 0      Immature Granulocytes 0.2 0.0 - 3.0 %    Neutrophils Segmented 59.1 34 - 64 %    Lymphocytes 24.0 (L) 28 - 48 %    Monocytes 12.7 1 - 13 %    Eosinophils 3.8 0 - 5 %    Basophils 0.2 0 - 3 %   Comprehensive Metabolic Panel    Collection Time: 10/09/21 10:42 AM   Result Value Ref Range    Potassium 3.6 3.5 - 5.1 mEq/L    Chloride 104 98 - 107 mEq/L    Sodium 135 (L) 136 - 145 mEq/L    CO2 23 20 - 31 mEq/L    Glucose 92 74 - 106 mg/dl    BUN <5 (L) 9 - 23 mg/dl    Creatinine 3.79 0.24 - 1.02 mg/dl    GFR African American >60.0      GFR Non-African American >60      Calcium 9.1 8.7 - 10.4 mg/dl    Anion Gap 8 5 - 15 mmol/L    AST 27.0 0.0 - 33.9 U/L    ALT 25 10 - 49  U/L    Alkaline Phosphatase 67 46 - 116 U/L    Total Bilirubin 0.40 0.30 - 1.20 mg/dl    Total Protein 7.3 5.7 - 8.2 gm/dl    Albumin 3.6 3.4 - 5.0 gm/dl       MRI ABDOMEN W CONTRAST MRCP    Result Date: 10/07/2021  EXAM:  MRI abdomen with MRCP with and without contrast INDICATIONS: Pancreatitis TECHNIQUE: Multisequence, multiplanar MRI of the abdomen with and without contrast, including 3D MRCP sequences. 3d MRCP images were performed on the scanner and submitted for review. COMPARISON: 04/23/2021. WORKSTATION ID: [id number] FINDINGS: Cholecystectomy. No significant intrahepatic or extrahepatic biliary ductal dilatation. CBD measures up to 5 mm. Liver, spleen, adrenal glands and kidneys grossly unremarkable. Enlarging 4.4 x 4.0 cm pancreatic head cystic lesion with thick T2 hypointense surrounding rim. There appears to be some debris within the inferior aspect of the cystic lesion. Pancreatic ductal dilation dilatation, particularly about the head measuring up to 5 mm, which may be due to mass effect from the cystic lesion versus stricture in the setting of chronic pancreatitis. No significant peripancreatic inflammatory changes to suggest acute pancreatitis. Visualized bowel loops grossly unremarkable. No significant lymphadenopathy. Incomplete characterization of left ovarian cystic lesion, probably reflecting a dominant follicle versus cyst, measuring approximately 2.4 cm. Small, likely physiologic quantity of free pelvic fluid.     IMPRESSION: 1.  Enlarging cystic pancreatic head lesion, favoring pancreatic pseudocyst. Other pancreatic cystic neoplasm is not excluded. Continued surveillance is recommended. May consider endoscopic ultrasound. 2. Pancreatic ductal dilatation, most pronounced within the pancreatic head measuring up to 5 mm. This may be due to mild mass effect from the cystic lesion versus stricture as a result of chronic pancreatitis. 3. No MRI evidence of acute pancreatitis. 4. Cholecystectomy.  Electronically signed by: Mikal Plane, MD 10/07/2021 8:54 PM EDT          Workstation ID: [id number]     US ABDOMEN LIMITED Specify organ? PANCREAS    Result Date: 10/05/2021  EXAMINATION: US ABDOMEN LIMITED CLINICAL INDICATION: intractable pain COMPARISON:  CT abdomen 04/23/2021;  TECHNIQUE: Abdominal ultrasound FINDINGS: Liver 16.9 cm. Increased echotexture. No discrete masses. No intrahepatic biliary duct dilatation. Gallbladder has been removed. Negative sonographic Murphy's sign. Common bile duct 5 mm. Pancreatic head cystic lesion 3.0 x 3.2 x 2.9 cm.  Pancreatic tail   partially obscured and cannot be evaluated. Proximal aorta and IVC Unremarkable. Right kidney 11.7 x 4.6 x 5.0 cm. Normal echotexture.     IMPRESSION: 1.  Pancreatic head cystic lesion. 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 Recommend follow-up evaluation by MRI with and without contrast. 2. Fatty liver. 3. Cholecystectomy. Electronically signed by: Earline Mayotte, MD 10/05/2021 4:45 AM EDT          Workstation ID: [id number]       Microbiology:  @MICRORESULTS @     Cardiology:  @LASTCARDIOLOGY @      Problem List:      Medications reviewed  Current Facility-Administered Medications   Medication Dose Route Frequency    LORazepam (ATIVAN) injection 1 mg  1 mg IntraVENous Once    HYDROmorphone (DILAUDID) injection 1 mg  1 mg IntraVENous Q4H PRN    0.9 % sodium chloride infusion   IntraVENous Once    ondansetron (ZOFRAN) injection 4 mg  4 mg IntraVENous Q6H PRN    famotidine (PEPCID) 20 mg in sodium chloride (PF) 0.9 % 10 mL injection  20 mg IntraVENous BID    dicyclomine (BENTYL) capsule 20 mg  20 mg Oral 4x Daily PRN    haloperidol (HALDOL) tablet 5 mg  5 mg Oral 4x Daily PRN    sodium chloride flush 0.9 % injection 5-40 mL  5-40 mL IntraVENous 2 times per day    sodium chloride flush 0.9 % injection 5-40 mL  5-40 mL IntraVENous PRN    0.9 % sodium chloride infusion   IntraVENous PRN    polyethylene glycol (GLYCOLAX)  packet 17 g  17 g Oral Daily PRN    acetaminophen (TYLENOL) tablet 650 mg  650 mg Oral Q6H PRN    Or    acetaminophen (TYLENOL) suppository 650 mg  650 mg Rectal Q6H PRN         06-27-1983, MD

## 2021-10-10 LAB — COMPREHENSIVE METABOLIC PANEL
ALT: 130 U/L — ABNORMAL HIGH (ref 10–49)
AST: 211 U/L — ABNORMAL HIGH (ref 0.0–33.9)
Albumin: 4 gm/dl (ref 3.4–5.0)
Alkaline Phosphatase: 129 U/L — ABNORMAL HIGH (ref 46–116)
Anion Gap: 10 mmol/L (ref 5–15)
BUN: <5 mg/dl — ABNORMAL LOW (ref 9–23)
CO2: 24 mEq/L (ref 20–31)
Calcium: 9.5 mg/dl (ref 8.7–10.4)
Chloride: 101 mEq/L (ref 98–107)
Creatinine: 0.6 mg/dl (ref 0.55–1.02)
GFR African American: >60
GFR Non-African American: >60
Glucose: 109 mg/dl — ABNORMAL HIGH (ref 74–106)
Potassium: 4 mEq/L (ref 3.5–5.1)
Sodium: 135 mEq/L — ABNORMAL LOW (ref 136–145)
Total Bilirubin: 1 mg/dl (ref 0.30–1.20)
Total Protein: 7.9 gm/dl (ref 5.7–8.2)

## 2021-10-10 LAB — LIPASE: Lipase: 252 U/L — ABNORMAL HIGH (ref 12–53)

## 2021-10-10 MED ORDER — PANTOPRAZOLE SODIUM 40 MG IV SOLR
40 MG | Freq: Every day | INTRAVENOUS | Status: AC
Start: 2021-10-10 — End: 2021-10-12
  Administered 2021-10-10 – 2021-10-12 (×3): 40 mg via INTRAVENOUS

## 2021-10-10 MED ORDER — OXYCODONE HCL 5 MG PO TABS
5 | ORAL | Status: DC | PRN
Start: 2021-10-10 — End: 2021-10-12
  Administered 2021-10-11 – 2021-10-12 (×10): 10 mg via ORAL

## 2021-10-10 MED ORDER — KETOROLAC TROMETHAMINE 30 MG/ML IJ SOLN
30 | Freq: Four times a day (QID) | INTRAMUSCULAR | Status: DC | PRN
Start: 2021-10-10 — End: 2021-10-12

## 2021-10-10 MED FILL — HYDROMORPHONE HCL 1 MG/ML IJ SOLN: 1 MG/ML | INTRAMUSCULAR | Qty: 1

## 2021-10-10 MED FILL — FAMOTIDINE (PF) 20 MG/2ML IV SOLN: 20 MG/2ML | INTRAVENOUS | Qty: 2

## 2021-10-10 MED FILL — SODIUM CHLORIDE FLUSH 0.9 % IV SOLN: 0.9 % | INTRAVENOUS | Qty: 10

## 2021-10-10 MED FILL — PANTOPRAZOLE SODIUM 40 MG IV SOLR: 40 MG | INTRAVENOUS | Qty: 40

## 2021-10-10 MED FILL — ONDANSETRON HCL 4 MG/2ML IJ SOLN: 4 MG/2ML | INTRAMUSCULAR | Qty: 2

## 2021-10-10 NOTE — Progress Notes (Incomplete)
Hospitalist Progress Note  [facility]    Daily Progress Note: 10/10/2021    Assessment/Plan:     Acute pancreatitis  Advance diet to CLD  F/up lipase trends.   Continue IVF and PRN pain meds  MRCP (9/22) whith larger 4x4.4cm pseudocyst  Still c/o severe pain and pt with freq visits to ER per records.  Will have GI eval pt regarding pseudocyst (spoke with Dr Janeann Forehand)    Intractable N/V   Improved    Chronic abd pain  Likely chronic pancreatitis with acute exacerbation by pseudocyst    Hx pancreatic pseudocyst  Larger as above    Hx polysubstance dependence  Cocaine, MJ, alcohol  Recheck UDS (9/24) with MJ and opiates. Pt is getting opiates inpt.    DVT Prophylaxis  Mobile, SCDs    Code status. Full Code    Disposition  GI to eval 9/25  Advanced    ------------------     Chart reviewed including notes, labs, imaging, studies     D/w floor RN regarding current POC and pt's condition     D/w pt and family regarding pt's condition and current POC    ------------------  Physical exam:  General: Alert, appears to be in pain.  Cooperative  HEENT: Sclera anicteric, PERRL, OM moist, throat clear.  Chest: no wheeze, no rales, no rhonchi.  Good air movement.  CV: RRR, S1/S2 were normal, no murmur  Abdomen: Diffuse tenderness, but much more in the upper abdomen in a belt across the upper abdomen, ND, soft, NABS, no masses were noted.  No rebound, no guarding.  Extremities: No edema.    Subjective:     Pt said the pain is better than compared to admission, but still quite bad.     Objective:     BP 126/75   Pulse 71   Temp 97.2 F (36.2 C) (Temporal)   Resp 18   Ht 5\' 5"  (1.651 m)   Wt 202 lb 13.2 oz (92 kg)   SpO2 94%   BMI 33.75 kg/m         Temp (24hrs), Avg:97.4 F (36.3 C), Min:97.2 F (36.2 C), Max:98.1 F (36.7 C)    No intake/output data recorded.   09/24 0701 - 09/25 1900  In: 580 [P.O.:580]  Out: -       Data Review:       Recent Days:  Recent Labs     10/08/21  0215 10/09/21  1042   WBC 5.9 5.0    HGB 8.6* 9.6*   HCT 24.7* 28.2*   PLT 165 200       Recent Labs     10/08/21  0215 10/09/21  1042 10/10/21  1241   NA 136 135* 135*   K 3.7 3.6 4.0   CL 104 104 101   CO2 22 23 24    BUN <5* <5* <5*   ALT <7* 25 130*       No results for input(s): "PH", "PCO2", "PO2", "HCO3", "FIO2" in the last 72 hours.    24 Hour Results:  Recent Results (from the past 24 hour(s))   Comprehensive Metabolic Panel    Collection Time: 10/10/21 12:41 PM   Result Value Ref Range    Potassium 4.0 3.5 - 5.1 mEq/L    Chloride 101 98 - 107 mEq/L    Sodium 135 (L) 136 - 145 mEq/L    CO2 24 20 - 31 mEq/L    Glucose 109 (H) 74 - 106  mg/dl    BUN <5 (L) 9 - 23 mg/dl    Creatinine 0.60 0.55 - 1.02 mg/dl    GFR African American >60.0      GFR Non-African American >60      Calcium 9.5 8.7 - 10.4 mg/dl    Anion Gap 10 5 - 15 mmol/L    AST 211.0 (H) 0.0 - 33.9 U/L    ALT 130 (H) 10 - 49 U/L    Alkaline Phosphatase 129 (H) 46 - 116 U/L    Total Bilirubin 1.00 0.30 - 1.20 mg/dl    Total Protein 7.9 5.7 - 8.2 gm/dl    Albumin 4.0 3.4 - 5.0 gm/dl   Lipase    Collection Time: 10/10/21 12:41 PM   Result Value Ref Range    Lipase 252 (H) 12 - 53 U/L       MRI ABDOMEN W CONTRAST MRCP    Result Date: 10/07/2021  EXAM:  MRI abdomen with MRCP with and without contrast INDICATIONS: Pancreatitis TECHNIQUE: Multisequence, multiplanar MRI of the abdomen with and without contrast, including 3D MRCP sequences. 3d MRCP images were performed on the scanner and submitted for review. COMPARISON: 04/23/2021. WORKSTATION ID: [id number] FINDINGS: Cholecystectomy. No significant intrahepatic or extrahepatic biliary ductal dilatation. CBD measures up to 5 mm. Liver, spleen, adrenal glands and kidneys grossly unremarkable. Enlarging 4.4 x 4.0 cm pancreatic head cystic lesion with thick T2 hypointense surrounding rim. There appears to be some debris within the inferior aspect of the cystic lesion. Pancreatic ductal dilation dilatation, particularly about the head measuring up  to 5 mm, which may be due to mass effect from the cystic lesion versus stricture in the setting of chronic pancreatitis. No significant peripancreatic inflammatory changes to suggest acute pancreatitis. Visualized bowel loops grossly unremarkable. No significant lymphadenopathy. Incomplete characterization of left ovarian cystic lesion, probably reflecting a dominant follicle versus cyst, measuring approximately 2.4 cm. Small, likely physiologic quantity of free pelvic fluid.     IMPRESSION: 1. Enlarging cystic pancreatic head lesion, favoring pancreatic pseudocyst. Other pancreatic cystic neoplasm is not excluded. Continued surveillance is recommended. May consider endoscopic ultrasound. 2. Pancreatic ductal dilatation, most pronounced within the pancreatic head measuring up to 5 mm. This may be due to mild mass effect from the cystic lesion versus stricture as a result of chronic pancreatitis. 3. No MRI evidence of acute pancreatitis. 4. Cholecystectomy. Electronically signed by: Elnita Maxwell, MD 10/07/2021 8:54 PM EDT          Workstation ID: [id number]     US ABDOMEN LIMITED Specify organ? PANCREAS    Result Date: 10/05/2021  EXAMINATION: US ABDOMEN LIMITED CLINICAL INDICATION: intractable pain COMPARISON:  CT abdomen 04/23/2021;  TECHNIQUE: Abdominal ultrasound FINDINGS: Liver 16.9 cm. Increased echotexture. No discrete masses. No intrahepatic biliary duct dilatation. Gallbladder has been removed. Negative sonographic Murphy's sign. Common bile duct 5 mm. Pancreatic head cystic lesion 3.0 x 3.2 x 2.9 cm.  Pancreatic tail   partially obscured and cannot be evaluated. Proximal aorta and IVC Unremarkable. Right kidney 11.7 x 4.6 x 5.0 cm. Normal echotexture.     IMPRESSION: 1.  Pancreatic head cystic lesion. 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 Recommend follow-up evaluation by MRI with and without contrast. 2. Fatty liver. 3. Cholecystectomy. Electronically signed by: Javier Glazier, MD 10/05/2021 4:45 AM EDT          Workstation ID: [id number]       Microbiology:  @MICRORESULTS @     Cardiology:  @[facility] @  Problem List:      Medications reviewed  Current Facility-Administered Medications   Medication Dose Route Frequency    ketorolac (TORADOL) injection 30 mg  30 mg IntraVENous Q6H PRN    pantoprazole (PROTONIX) 40 mg in sodium chloride (PF) 0.9 % 10 mL injection  40 mg IntraVENous Daily    LORazepam (ATIVAN) injection 1 mg  1 mg IntraVENous Once    HYDROmorphone (DILAUDID) injection 1 mg  1 mg IntraVENous Q4H PRN    0.9 % sodium chloride infusion   IntraVENous Once    ondansetron (ZOFRAN) injection 4 mg  4 mg IntraVENous Q6H PRN    dicyclomine (BENTYL) capsule 20 mg  20 mg Oral 4x Daily PRN    haloperidol (HALDOL) tablet 5 mg  5 mg Oral 4x Daily PRN    sodium chloride flush 0.9 % injection 5-40 mL  5-40 mL IntraVENous 2 times per day    sodium chloride flush 0.9 % injection 5-40 mL  5-40 mL IntraVENous PRN    0.9 % sodium chloride infusion   IntraVENous PRN    polyethylene glycol (GLYCOLAX) packet 17 g  17 g Oral Daily PRN    acetaminophen (TYLENOL) tablet 650 mg  650 mg Oral Q6H PRN    Or    acetaminophen (TYLENOL) suppository 650 mg  650 mg Rectal Q6H PRN         Alvy Bimler, MD

## 2021-10-10 NOTE — Consults (Signed)
Gastroenterology Consult    Patient: Rhonda Hammond MRN: [id number]  SSN: [id number]    Date of Birth: 11/10/1990  Age: 31 y.o.  Sex: female      Assessment:   --Acute pancreatitis with possible developing pseudocyst- long standing hx of this, related to alcohol.    -MRI/MRCP 10/07/21- Enlarging cystic pancreatic head lesion, favoring pancreatic pseudocyst. Other pancreatic cystic neoplasm is not excluded. Pancreatic ductal dilatation, most pronounced within the pancreatic head measuring up to 5 mm. No MRI evidence of acute pancreatitis.Cholecystectomy.   -RUQ [place] 10/05/21- Pancreatic head cystic lesion. 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  Fatty liver. Cholecystectomy.  --Nausea and vomiting- multiple admissions for this. I do not see a formal GI evaluation for this. Ddx includes Cannabis hyperemesis, peptic ulcer disease, sequela of pancreatitis, partial GOO.  --Hx of poly substance abuse- cocaine, marijuana, alcohol  --Abdominal pain  --Iron deficiency Anemia- Hgb 9.6 g/dL, etiology includes thalassemia trait, IDA due to blood loss--menorrhagia, occult GI bleed.    Plan:   --Recommend EGD Wednesday 10/12/21 to evaluate long standing hx of nausea, vomiting, abdominal pain. NPO Wednesday after midnight.  --The patient should have an EUS to evaluate pancreatic head lesion as seen on MRI. Given that this is possibly a pseudocyst this should be done in 6-8 weeks to allow for maturation of it.  --Recommend alcohol abstinence.   --Diet as tolerated.  --Monitor Hgb/Hct and transfuse as needed to keep Hgb >8.  GI attending  I have independently seen and examined the patient. I have reviewed all pertinent labs and imaging. I independently created a management plan with discussion with , NP/PA-C and I agree with the note.   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 MRI on 10/07/21 showed a cystic  lesion in HOP, favor pseudocyst. Has mild anemia. Recommend EGD. Will need EUS as out pt to evaluate pancreatic lesion better. Cont supportive care. Will follow. Counseled about abstinence from alcohol, cocaine and marijuana.     Patsy , MD  [facility]      Subjective:      Rhonda Hammond is a 31 y.o. female who is being seen for complications of pancreatitis. She has had multiple ER admissions for chronic pancreatitis, nausea, vomiting, abdominal pain. She came in to the ER 10/05/21 for these symptoms. Lipase was found to be 481. Hx of poly substance abuse. She was admitted for pain management ant treatment of symptoms. RUQ US showed  a pancreatic head cystic lesion, pancreatic pseudocyst favored over cystic pancreatic neoplasm. She had an MRI ordered that showed enlarging cystic pancreatic head lesion, pancreatic ductal dilatation, most pronounced within the pancreatic head measuring up to 5 mm. Lipase has trended down to 107. Hgb this morning was 9.6 g/dL with MCV 42.3.    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     Intractable nausea and vomiting 06/26/2020    Obesity 12/16/2018    Pancreatitis      Past Surgical History:   Procedure Laterality Date    CESAREAN SECTION      CHOLECYSTECTOMY        Family History   Problem Relation Age of Onset    No Known Problems Mother  Social History     Tobacco Use    Smoking status: Every Day     Types: Cigars    Smokeless tobacco: Not on file   Substance Use Topics    Alcohol use: Yes     Alcohol/week: 21.0 standard drinks of alcohol     Types: 21 Shots of liquor per week     Comment: 2-3 drinks a day, sometimes more      Current Facility-Administered Medications   Medication Dose Route Frequency Provider Last Rate Last Admin    LORazepam (ATIVAN) injection 1 mg  1 mg IntraVENous  Once Marlene Bast, MD        HYDROmorphone (DILAUDID) injection 1 mg  1 mg IntraVENous Q4H PRN Marlene Bast, MD   1 mg at 10/10/21 0922    0.9 % sodium chloride infusion   IntraVENous Once Irving Burton, MD 125 mL/hr at 10/05/21 2229 Restarted at 10/05/21 2229    ondansetron (ZOFRAN) injection 4 mg  4 mg IntraVENous Q6H PRN Irving Burton, MD   4 mg at 10/10/21 0506    famotidine (PEPCID) 20 mg in sodium chloride (PF) 0.9 % 10 mL injection  20 mg IntraVENous BID Irving Burton, MD   20 mg at 10/10/21 0813    dicyclomine (BENTYL) capsule 20 mg  20 mg Oral 4x Daily PRN Irving Burton, MD   20 mg at 10/09/21 0945    haloperidol (HALDOL) tablet 5 mg  5 mg Oral 4x Daily PRN Irving Burton, MD   5 mg at 10/07/21 1617    sodium chloride flush 0.9 % injection 5-40 mL  5-40 mL IntraVENous 2 times per day Irving Burton, MD   10 mL at 10/10/21 0813    sodium chloride flush 0.9 % injection 5-40 mL  5-40 mL IntraVENous PRN Irving Burton, MD   10 mL at 10/10/21 0924    0.9 % sodium chloride infusion   IntraVENous PRN Irving Burton, MD 125 mL/hr at 10/06/21 0942 New Bag at 10/06/21 0942    polyethylene glycol (GLYCOLAX) packet 17 g  17 g Oral Daily PRN Irving Burton, MD        acetaminophen (TYLENOL) tablet 650 mg  650 mg Oral Q6H PRN Irving Burton, MD   650 mg at 10/07/21 1617    Or    acetaminophen (TYLENOL) suppository 650 mg  650 mg Rectal Q6H PRN Irving Burton, MD            No Known Allergies    Review of Systems:  A 12 point review of systems were obtained with pertinent positives noted in above HPI, otherwise negative     Objective:     Patient Vitals for the past 24 hrs:   Temp Pulse Resp BP SpO2   10/10/21 0912 98.1 F (36.7 C) 88 18 131/81 (!) 89 %   10/10/21 0419 97.2 F (36.2 C) 95 18 (!) 114/93 92 %   10/10/21 0056 97.3 F (36.3 C) 90 18 (!) 121/99 95 %   10/09/21 1946 97.2 F (36.2 C) 77 18 128/81 97 %   10/09/21 1850 97.3 F (36.3 C) 61 18 119/73 100 %    10/09/21 1508 97 F (36.1 C) 56 18 122/77 97 %   10/09/21 1244 97.7 F (36.5 C) 65 18 (!) 120/92 100 %         Intake/Output Summary (Last 24 hours) at 10/10/2021 0945  Last data filed at 10/09/2021 1330  Gross per 24 hour  Intake 220 ml   Output --   Net 220 ml       Physical Exam:  GENERAL: alert, pleasant, cooperative, no distress, appears stated age  ABDOMEN: soft, tender in epigastric region, bowel sounds normal. No palpable masses hernias or organomegaly appreciated   SKIN: no rash or abnormalities  NEUROLOGIC: AOx3. Gait normal. Reflexes and motor strength normal and symmetric. Cranial nerves 2-12 and sensation grossly intact    Laboratory Results:    Labs: Results:   Chemistry Recent Labs     10/08/21  0215 10/09/21  1042   NA 136 135*   K 3.7 3.6   CL 104 104   CO2 22 23   BUN <5* <5*   TP 6.5 7.3    Estimated Creatinine Clearance: 162 mL/min (based on SCr of 0.56 mg/dL).   CBC w/Diff Recent Labs     10/08/21  0215 10/09/21  1042   WBC 5.9 5.0   RBC 3.37* 3.82   HGB 8.6* 9.6*   HCT 24.7* 28.2*   PLT 165 200      Cardiac Enzymes No results for input(s): "CPK", "MYO" in the last 72 hours.    Invalid input(s): "CKRMB", "CKND1", "TROIP"   Coagulation No results for input(s): "INR", "APTT" in the last 72 hours.    Invalid input(s): "PTP"    Hepatitis Panel @[facility] @  Recent Labs     10/08/21  0215 10/09/21  1042   ALT <7* 25   TP 6.5 7.3         Amylase Lipase    Liver Enzymes Recent Labs     10/09/21  1042   TP 7.3   ALT 25      Thyroid Studies No results for input(s): "T4", "T3RU", "TSH" in the last 72 hours.    Invalid input(s): "T3U"     Pathology pathology     Signed By: Darlina Guys PA-C    October 10, 2021     Office: [phone]

## 2021-10-11 MED FILL — OXYCODONE HCL 5 MG PO TABS: 5 MG | ORAL | Qty: 2

## 2021-10-11 MED FILL — SODIUM CHLORIDE FLUSH 0.9 % IV SOLN: 0.9 % | INTRAVENOUS | Qty: 10

## 2021-10-11 NOTE — Progress Notes (Signed)
Hospitalist Progress Note  [facility]    Daily Progress Note: 10/11/2021    Assessment/Plan:     Acute pancreatitis  Advance diet to CLD  F/up lipase trends.   Continue IVF and PRN pain meds  MRCP (9/22) whith larger 4x4.4cm pseudocyst  Still c/o severe pain and pt with freq visits to ER per records.  GI (Dr Frederik Pear) evaluated the pt and noted plans for EGD 9/27 and EUS of the pseudocyst as an outpt in 4-6 weeks    Intractable N/V   Improved    Chronic abd pain  Likely chronic pancreatitis with acute exacerbation by pseudocyst    Hx pancreatic pseudocyst  Larger as above    Hx polysubstance dependence  Cocaine, MJ, alcohol  Recheck UDS (9/24) with MJ and opiates. Pt is getting opiates inpt.    DVT Prophylaxis  Mobile, SCDs    Code status. Full Code    Disposition  GI input appreciated.   Plan for EGD 9/27  Outpt EUS of the pancreas in 4-6 weeks    ------------------     Chart reviewed including notes, labs, imaging, studies     D/w floor RN regarding current POC and pt's condition     D/w pt and family regarding pt's condition and current POC    ------------------  Physical exam:  General: Alert, appears to be in pain.  Cooperative  HEENT: Sclera anicteric, PERRL, OM moist, throat clear.  Chest: no wheeze, no rales, no rhonchi.  Good air movement.  CV: RRR, S1/S2 were normal, no murmur  Abdomen: Diffuse tenderness, but much more in the upper abdomen in a belt across the upper abdomen, ND, soft, NABS, no masses were noted.  No rebound, no guarding.  Extremities: No edema.    Subjective:     Pt said the pain is better than compared to admission, but still quite bad. We discussed lans for EGD 9/27 and EUS as an outpt    Objective:     BP 114/66   Pulse 81   Temp (!) 96.4 F (35.8 C) (Temporal)   Resp 18   Ht 5\' 5"  (1.651 m)   Wt 202 lb 13.2 oz (92 kg)   SpO2 100%   BMI 33.75 kg/m         Temp (24hrs), Avg:96.8 F (36 C), Min:96.4 F (35.8 C), Max:97.2 F (36.2 C)    No intake/output data recorded.    No intake/output data recorded.      Data Review:       Recent Days:  Recent Labs     10/09/21  1042   WBC 5.0   HGB 9.6*   HCT 28.2*   PLT 200       Recent Labs     10/09/21  1042 10/10/21  1241   NA 135* 135*   K 3.6 4.0   CL 104 101   CO2 23 24   BUN <5* <5*   ALT 25 130*       No results for input(s): "PH", "PCO2", "PO2", "HCO3", "FIO2" in the last 72 hours.    24 Hour Results:  No results found for this or any previous visit (from the past 24 hour(s)).      MRI ABDOMEN W CONTRAST MRCP    Result Date: 10/07/2021  EXAM:  MRI abdomen with MRCP with and without contrast INDICATIONS: Pancreatitis TECHNIQUE: Multisequence, multiplanar MRI of the abdomen with and without contrast, including 3D MRCP sequences. 3d MRCP images were  performed on the scanner and submitted for review. COMPARISON: 04/23/2021. WORKSTATION ID: [id number] FINDINGS: Cholecystectomy. No significant intrahepatic or extrahepatic biliary ductal dilatation. CBD measures up to 5 mm. Liver, spleen, adrenal glands and kidneys grossly unremarkable. Enlarging 4.4 x 4.0 cm pancreatic head cystic lesion with thick T2 hypointense surrounding rim. There appears to be some debris within the inferior aspect of the cystic lesion. Pancreatic ductal dilation dilatation, particularly about the head measuring up to 5 mm, which may be due to mass effect from the cystic lesion versus stricture in the setting of chronic pancreatitis. No significant peripancreatic inflammatory changes to suggest acute pancreatitis. Visualized bowel loops grossly unremarkable. No significant lymphadenopathy. Incomplete characterization of left ovarian cystic lesion, probably reflecting a dominant follicle versus cyst, measuring approximately 2.4 cm. Small, likely physiologic quantity of free pelvic fluid.     IMPRESSION: 1. Enlarging cystic pancreatic head lesion, favoring pancreatic pseudocyst. Other pancreatic cystic neoplasm is not excluded. Continued surveillance is recommended. May  consider endoscopic ultrasound. 2. Pancreatic ductal dilatation, most pronounced within the pancreatic head measuring up to 5 mm. This may be due to mild mass effect from the cystic lesion versus stricture as a result of chronic pancreatitis. 3. No MRI evidence of acute pancreatitis. 4. Cholecystectomy. Electronically signed by: Mikal Plane, MD 10/07/2021 8:54 PM EDT          Workstation ID: [id number]     US ABDOMEN LIMITED Specify organ? PANCREAS    Result Date: 10/05/2021  EXAMINATION: US ABDOMEN LIMITED CLINICAL INDICATION: intractable pain COMPARISON:  CT abdomen 04/23/2021;  TECHNIQUE: Abdominal ultrasound FINDINGS: Liver 16.9 cm. Increased echotexture. No discrete masses. No intrahepatic biliary duct dilatation. Gallbladder has been removed. Negative sonographic Murphy's sign. Common bile duct 5 mm. Pancreatic head cystic lesion 3.0 x 3.2 x 2.9 cm.  Pancreatic tail   partially obscured and cannot be evaluated. Proximal aorta and IVC Unremarkable. Right kidney 11.7 x 4.6 x 5.0 cm. Normal echotexture.     IMPRESSION: 1.  Pancreatic head cystic lesion. 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 Recommend follow-up evaluation by MRI with and without contrast. 2. Fatty liver. 3. Cholecystectomy. Electronically signed by: Earline Mayotte, MD 10/05/2021 4:45 AM EDT          Workstation ID: [id number]       Microbiology:  @MICRORESULTS @     Cardiology:  @[facility] @      Problem List:      Medications reviewed  Current Facility-Administered Medications   Medication Dose Route Frequency    ketorolac (TORADOL) injection 30 mg  30 mg IntraVENous Q6H PRN    pantoprazole (PROTONIX) 40 mg in sodium chloride (PF) 0.9 % 10 mL injection  40 mg IntraVENous Daily    oxyCODONE (ROXICODONE) immediate release tablet 10 mg  10 mg Oral Q4H PRN    LORazepam (ATIVAN) injection 1 mg  1 mg IntraVENous Once    HYDROmorphone (DILAUDID) injection 1 mg  1 mg IntraVENous Q4H PRN    0.9 % sodium chloride infusion    IntraVENous Once    ondansetron (ZOFRAN) injection 4 mg  4 mg IntraVENous Q6H PRN    dicyclomine (BENTYL) capsule 20 mg  20 mg Oral 4x Daily PRN    haloperidol (HALDOL) tablet 5 mg  5 mg Oral 4x Daily PRN    sodium chloride flush 0.9 % injection 5-40 mL  5-40 mL IntraVENous 2 times per day    sodium chloride flush 0.9 % injection 5-40 mL  5-40 mL IntraVENous PRN  0.9 % sodium chloride infusion   IntraVENous PRN    polyethylene glycol (GLYCOLAX) packet 17 g  17 g Oral Daily PRN    acetaminophen (TYLENOL) tablet 650 mg  650 mg Oral Q6H PRN    Or    acetaminophen (TYLENOL) suppository 650 mg  650 mg Rectal Q6H PRN         Kristen Loader, MD

## 2021-10-11 NOTE — Progress Notes (Signed)
PROGRESS NOTE   PATIENT:  Rhonda Hammond           MRN: [id number]           [facility] [place], 5109[facility]109           10/11/2021    ASSESSMENT:  --Acute Alcohol induced Pancreatitis with enlarging cystic pancreatic head lesion thought to be pseudocyst on MRI    -Hx of Recurrent Pancreatitis    -MRI/MRCP 10/07/21 notes enlarging cystic pancreatic head lesion 4.4x4.0cm. Pancreatic ductal dilatation, most pronounced within the pancreatic head 42mm. May be due to mild mass effect from the cystic lesion vs stricture. No MR evidence of acute pancreatitis. No significant intrahepatic or extrahepatic biliary ductal dilatation. CBD 43mm.   -S/p Cholecystectomy   --Abdominal Pain with associated Nausea or Vomiting (Improving) suspect secondary to above. Other Ddx: Cannabis Hyperemesis, PUD, partial GOO, sequela of pancreatis   --Microcytic Anemia suspect IDA in the setting of Menorrhagia-Hgb 9.6gm/dl     -LMP 28/00/34  --Hx of Polysubstance Dependence, Cocaine, Marijuana and Alcohol     PLAN:     --Monitor H&H. Transfuse as needed   --Recommend Iron Supplementation   --Diet as tolerated for now, NPO after midnight   --On call for EGD 10/12/2021 (Explained procedure to patient and addressed all questions. The risk, benefits, and alternatives of the procedure and the sedation options were discussed. The patient is aware of the potential complications including but not limited to bleeding, perforation, aspiration, infection, sedation adverse events, missed diagnosis, missed lesions, intravenous site complications, and even death. Other options including not doing the procedure were discussed. All questions were answered. The patient agrees to proceed with the procedure.)    --Educated Pt about the risks of continued EtOH use and strongly encouraged the Pt to quit   --Recommend possible repeat imaging +/- outpt elective EUS to further evaluate pancreatic head lesion in 6-8weeks identified on MRI  Gi attending  I  have independently seen and examined the patient. I have reviewed all pertinent labs and imaging. I independently created a management plan with discussion with , NP/PA-C and I agree with the note.     Pt with epigastric pain, nausea, vomiting. Has recurrent pancreatitis secondary to alcohol use. Has h/o polysubstance abuse. Recommend EGD tomorrow. Will need repeat CT or  EUS as out pt to monitor pancreatic pseudocyst.       Patsy Hendricks, MD  [facility] [place]      SUBJECTIVE:  Pt reports some improvement of her abdominal pain. She denies nausea and vomiting so far today    OBJECTIVE:  Patient Vitals for the past 24 hrs:   Temp Pulse Resp BP SpO2   10/11/21 0512 97.2 F (36.2 C) 61 17 119/77 100 %   10/11/21 0029 97 F (36.1 C) 68 17 121/78 100 %   10/10/21 2045 97.2 F (36.2 C) 63 17 (!) 151/101 100 %   10/10/21 1750 97.2 F (36.2 C) 71 18 126/75 94 %   10/10/21 0912 98.1 F (36.7 C) 88 18 131/81 (!) 89 %     Physical Exam:  GENERAL: no distress, appears stated age  EYE: No scleral icterus noted   ABDOMEN: soft, tender, bowel sounds active     Labs: Results:   Chemistry Recent Labs     10/09/21  1042 10/10/21  1241   NA 135* 135*   K 3.6 4.0   CL 104 101   CO2 23 24   BUN <5* <  5*   TP 7.3 7.9    Estimated Creatinine Clearance: 151 mL/min (based on SCr of 0.6 mg/dL).   CBC w/Diff Recent Labs     10/09/21  1042   WBC 5.0   RBC 3.82   HGB 9.6*   HCT 28.2*   PLT 200      Cardiac Enzymes No results for input(s): "CPK", "MYO" in the last 72 hours.    Invalid input(s): "CKRMB", "CKND1", "TROIP"   Coagulation No results for input(s): "INR", "APTT" in the last 72 hours.    Invalid input(s): "PTP"    Hepatitis Panel @LASTHBV @  Recent Labs     10/09/21  1042 10/10/21  1241   ALT 25 130*   TP 7.3 7.9         Amylase Lipase    Liver Enzymes Recent Labs     10/10/21  1241   TP 7.9   ALT 130*      Thyroid Studies No results for input(s): "T4", "T3RU", "TSH" in the last 72 hours.    Invalid input(s):  "T3U"     Pathology pathology     No Known Allergies    Current Facility-Administered Medications   Medication Dose Route Frequency    ketorolac (TORADOL) injection 30 mg  30 mg IntraVENous Q6H PRN    pantoprazole (PROTONIX) 40 mg in sodium chloride (PF) 0.9 % 10 mL injection  40 mg IntraVENous Daily    oxyCODONE (ROXICODONE) immediate release tablet 10 mg  10 mg Oral Q4H PRN    LORazepam (ATIVAN) injection 1 mg  1 mg IntraVENous Once    HYDROmorphone (DILAUDID) injection 1 mg  1 mg IntraVENous Q4H PRN    0.9 % sodium chloride infusion   IntraVENous Once    ondansetron (ZOFRAN) injection 4 mg  4 mg IntraVENous Q6H PRN    dicyclomine (BENTYL) capsule 20 mg  20 mg Oral 4x Daily PRN    haloperidol (HALDOL) tablet 5 mg  5 mg Oral 4x Daily PRN    sodium chloride flush 0.9 % injection 5-40 mL  5-40 mL IntraVENous 2 times per day    sodium chloride flush 0.9 % injection 5-40 mL  5-40 mL IntraVENous PRN    0.9 % sodium chloride infusion   IntraVENous PRN    polyethylene glycol (GLYCOLAX) packet 17 g  17 g Oral Daily PRN    acetaminophen (TYLENOL) tablet 650 mg  650 mg Oral Q6H PRN    Or    acetaminophen (TYLENOL) suppository 650 mg  650 mg Rectal Q6H PRN     Principal Problem:    Acute on chronic pancreatitis (HCC)  Active Problems:    Chronic abdominal pain  Resolved Problems:    * No resolved hospital problems. *    [place], FNP-C  Office: [phone]  October 11, 2021

## 2021-10-12 LAB — BASIC METABOLIC PANEL
Anion Gap: 6 mmol/L (ref 5–15)
BUN: <5 mg/dl — ABNORMAL LOW (ref 9–23)
CO2: 29 mEq/L (ref 20–31)
Calcium: 8.7 mg/dl (ref 8.7–10.4)
Chloride: 103 mEq/L (ref 98–107)
Creatinine: 0.52 mg/dl — ABNORMAL LOW (ref 0.55–1.02)
GFR African American: >60
GFR Non-African American: >60
Glucose: 94 mg/dl (ref 74–106)
Potassium: 3.7 mEq/L (ref 3.5–5.1)
Sodium: 138 mEq/L (ref 136–145)

## 2021-10-12 LAB — CBC WITH AUTO DIFFERENTIAL
Basophils: 0.2 % (ref 0–3)
Eosinophils: 4.1 % (ref 0–5)
Hematocrit: 25.9 % — ABNORMAL LOW (ref 35.0–47.0)
Hemoglobin: 9.1 gm/dl — ABNORMAL LOW (ref 11.0–16.0)
Immature Granulocytes: 0.2 % (ref 0.0–3.0)
Lymphocytes: 33.6 % (ref 28–48)
MCH: 25.5 pg (ref 25.4–34.6)
MCHC: 35.1 gm/dl (ref 30.0–36.0)
MCV: 72.5 fL — ABNORMAL LOW (ref 80.0–98.0)
MPV: 10.8 fL — ABNORMAL HIGH (ref 6.0–10.0)
Monocytes: 12.5 % (ref 1–13)
Neutrophils Segmented: 49.4 % (ref 34–64)
Nucleated RBCs: 0 (ref 0–0)
Platelets: 210 10*3/uL (ref 140–450)
RBC: 3.57 M/uL — ABNORMAL LOW (ref 3.60–5.20)
RDW: 43.8 (ref 36.4–46.3)
WBC: 6.3 10*3/uL (ref 4.0–11.0)

## 2021-10-12 LAB — PROTIME-INR
INR: 1.1 (ref 0.1–1.1)
Protime: 13.4 seconds — ABNORMAL HIGH (ref 10.2–12.9)

## 2021-10-12 MED ORDER — PANTOPRAZOLE SODIUM 40 MG PO TBEC
40 MG | ORAL_TABLET | Freq: Two times a day (BID) | ORAL | 1 refills | Status: AC
Start: 2021-10-12 — End: 2021-12-19

## 2021-10-12 MED ORDER — LACTATED RINGERS IV SOLN
INTRAVENOUS | Status: DC
Start: 2021-10-12 — End: 2021-10-12

## 2021-10-12 MED ORDER — PROPOFOL 1000 MG/100ML IV EMUL
1000 MG/100ML | INTRAVENOUS | Status: DC | PRN
Start: 2021-10-12 — End: 2021-10-12
  Administered 2021-10-12: 16:00:00 150 via INTRAVENOUS

## 2021-10-12 MED ORDER — DEXMEDETOMIDINE HCL 200 MCG/2ML IV SOLN
200 MCG/2ML | INTRAVENOUS | Status: DC | PRN
Start: 2021-10-12 — End: 2021-10-12
  Administered 2021-10-12: 16:00:00 8 via INTRAVENOUS

## 2021-10-12 MED ORDER — GLYCOPYRROLATE 0.2 MG/ML IJ SOLN
0.2 MG/ML | INTRAMUSCULAR | Status: DC | PRN
Start: 2021-10-12 — End: 2021-10-12
  Administered 2021-10-12: 16:00:00 .2 via INTRAVENOUS

## 2021-10-12 MED ORDER — OXYCODONE HCL 5 MG PO TABS
5 MG | ORAL_TABLET | Freq: Four times a day (QID) | ORAL | 0 refills | Status: AC | PRN
Start: 2021-10-12 — End: 2021-10-17

## 2021-10-12 MED ORDER — NORMAL SALINE FLUSH 0.9 % IV SOLN
0.9 % | INTRAVENOUS | Status: DC | PRN
Start: 2021-10-12 — End: 2021-10-12

## 2021-10-12 MED ORDER — LIDOCAINE HCL 2 % IJ SOLN
2 % | INTRAMUSCULAR | Status: DC | PRN
Start: 2021-10-12 — End: 2021-10-12
  Administered 2021-10-12: 16:00:00 100 via INTRAVENOUS

## 2021-10-12 MED ORDER — PROPOFOL 200 MG/20ML IV EMUL
200 MG/20ML | INTRAVENOUS | Status: DC | PRN
Start: 2021-10-12 — End: 2021-10-12
  Administered 2021-10-12: 16:00:00 100 via INTRAVENOUS

## 2021-10-12 MED ORDER — UNABLE TO FIND
0 refills | Status: AC
Start: 2021-10-12 — End: 2021-12-19

## 2021-10-12 MED ORDER — LACTATED RINGERS IV SOLN
INTRAVENOUS | Status: DC | PRN
Start: 2021-10-12 — End: 2021-10-12
  Administered 2021-10-12: 16:00:00 via INTRAVENOUS

## 2021-10-12 MED ORDER — SODIUM CHLORIDE 0.9 % IV SOLN
0.9 % | INTRAVENOUS | Status: DC | PRN
Start: 2021-10-12 — End: 2021-10-12

## 2021-10-12 MED FILL — OXYCODONE HCL 5 MG PO TABS: 5 MG | ORAL | Qty: 2

## 2021-10-12 MED FILL — PANTOPRAZOLE SODIUM 40 MG IV SOLR: 40 MG | INTRAVENOUS | Qty: 40

## 2021-10-12 MED FILL — LACTATED RINGERS IV SOLN: INTRAVENOUS | Qty: 1000

## 2021-10-12 MED FILL — SODIUM CHLORIDE FLUSH 0.9 % IV SOLN: 0.9 % | INTRAVENOUS | Qty: 10

## 2021-10-12 NOTE — Progress Notes (Addendum)
Called pt back she is asking about dc home she wants to go informed her CM will message MD> MD saw pt and will dc home.      Discharge Plan:   home with MD follow up. Dc instructions per MD and dc RN to pt See epic for follow up care as noted.    Discharge Date:     10/12/2021       Transportation: Family       PCP - General          Next Steps: Schedule an appointment as soon as possible for a visit  Appointment:   Instructions: Per pt, PCP is Dr. Edyth Gunnels.

## 2021-10-12 NOTE — Other (Signed)
TRANSFER - OUT REPORT:    Verbal report given to Alleen Borne RN (name) on Rhonda Hammond  being transferred to 5109 (unit) for routine post-op       Report consisted of patient's Situation, Background, Assessment and   Recommendations(SBAR).     Information from the following report(s) Adult Overview was reviewed with the receiving nurse.    Opportunity for questions and clarification was provided.      Patient transported with:   [employer]

## 2021-10-12 NOTE — Anesthesia Pre-Procedure Evaluation (Addendum)
Department of Anesthesiology  Preprocedure Note       Name:  Rhonda Hammond   Age:  31 y.o.  DOB:  May 06, 1990                                          MRN:  [id number]         Date:  10/12/2021      Surgeon: Moishe Spice):  Elberta Spaniel, MD    Procedure: Procedure(s):  EGD DIAGNOSTIC ONLY    Medications prior to admission:   Prior to Admission medications    Medication Sig Start Date End Date Taking? Authorizing Provider   famotidine (PEPCID) 20 MG tablet Take 1 tablet by mouth 2 times daily   Yes Historical Provider, MD   omeprazole (PRILOSEC) 20 MG delayed release capsule Take 2 capsules by mouth Daily   Yes Historical Provider, MD   promethazine (PHENERGAN) 25 MG tablet Take 1 tablet by mouth every 6 hours as needed for Nausea   Yes Historical Provider, MD   ondansetron (ZOFRAN) 4 MG tablet Take 1 tablet by mouth every 8 hours as needed for Nausea or Vomiting 09/21/21   Apolonio Schneiders, APRN - NP   albuterol sulfate HFA (PROVENTIL;VENTOLIN;PROAIR) 108 (90 Base) MCG/ACT inhaler Inhale 2 puffs into the lungs every 6 hours as needed for Wheezing 06/07/21   Algis Downs, MD   dicyclomine (BENTYL) 20 MG tablet Take 1 tablet by mouth 4 times daily as needed (abdominal pain) 06/01/21   Rejeana Brock, APRN - NP   ondansetron (ZOFRAN) 4 MG tablet Take 1 tablet by mouth every 8 hours as needed for Nausea or Vomiting 06/01/21   Rejeana Brock, APRN - NP   haloperidol (HALDOL) 5 MG tablet Take 1 tablet by mouth 4 times daily as needed (abdominal pain) 05/09/21   Jaynie Collins, MD   pantoprazole (PROTONIX) 40 MG tablet Take 1 tablet by mouth 2 times daily (before meals) 05/09/21   Jaynie Collins, MD   albuterol (ACCUNEB) 0.63 MG/3ML nebulizer solution Inhale 0.63 mg into the lungs every 6 hours as needed    Ar Automatic Reconciliation       Current medications:    Current Facility-Administered Medications   Medication Dose Route Frequency Provider Last Rate Last Admin   . ketorolac (TORADOL) injection 30 mg  30 mg IntraVENous  Q6H PRN Alvy Bimler, MD       . pantoprazole (PROTONIX) 40 mg in sodium chloride (PF) 0.9 % 10 mL injection  40 mg IntraVENous Daily Alvy Bimler, MD   40 mg at 10/12/21 0829   . oxyCODONE (ROXICODONE) immediate release tablet 10 mg  10 mg Oral Q4H PRN Alvy Bimler, MD   10 mg at 10/12/21 0953   . LORazepam (ATIVAN) injection 1 mg  1 mg IntraVENous Once Marlene Bast, MD       . HYDROmorphone (DILAUDID) injection 1 mg  1 mg IntraVENous Q4H PRN Marlene Bast, MD   1 mg at 10/10/21 1730   . 0.9 % sodium chloride infusion   IntraVENous Once Irving Burton, MD 125 mL/hr at 10/05/21 2229 Restarted at 10/05/21 2229   . ondansetron (ZOFRAN) injection 4 mg  4 mg IntraVENous Q6H PRN Irving Burton, MD   4 mg at 10/10/21 0506   . dicyclomine (BENTYL) capsule 20 mg  20  mg Oral 4x Daily PRN Rosalene Billings, MD   20 mg at 10/09/21 0945   . haloperidol (HALDOL) tablet 5 mg  5 mg Oral 4x Daily PRN Rosalene Billings, MD   5 mg at 10/07/21 1617   . sodium chloride flush 0.9 % injection 5-40 mL  5-40 mL IntraVENous 2 times per day Rosalene Billings, MD   10 mL at 10/12/21 0829   . sodium chloride flush 0.9 % injection 5-40 mL  5-40 mL IntraVENous PRN Rosalene Billings, MD   10 mL at 10/10/21 0924   . 0.9 % sodium chloride infusion   IntraVENous PRN Rosalene Billings, MD 125 mL/hr at 10/06/21 0942 New Bag at 10/06/21 0942   . polyethylene glycol (GLYCOLAX) packet 17 g  17 g Oral Daily PRN Rosalene Billings, MD       . acetaminophen (TYLENOL) tablet 650 mg  650 mg Oral Q6H PRN Rosalene Billings, MD   650 mg at 10/07/21 1617    Or   . acetaminophen (TYLENOL) suppository 650 mg  650 mg Rectal Q6H PRN Rosalene Billings, MD           Allergies:    Allergies   Allergen Reactions   . Droperidol Other (See Comments)     Face twitching, drooling and spontaneous leg movement       Problem List:    Patient Active Problem List   Diagnosis Code   . Intractable nausea and vomiting R11.2   . Obesity E66.9   . Acute on chronic  pancreatitis (HCC) K85.90, K86.1   . Chronic alcoholic pancreatitis ([place]) K86.0   . Pancreatic pseudocyst/cyst K86.2, K86.3   . Acute pancreatitis K85.90   . Chronic abdominal pain R10.9, G89.29   . Intractable vomiting R11.10       Past Medical History:        Diagnosis Date   . Alcohol abuse    . 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 Cocaine abuse ([place]) 06/2019   . 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 Intractable nausea and vomiting 06/26/2020   . Obesity 12/16/2018   . Pancreatitis        Past Surgical History:        Procedure Laterality Date   . CESAREAN SECTION     . CHOLECYSTECTOMY         Social History:    Social History     Tobacco Use   . Smoking status: Every Day     Types: Cigars   . Smokeless tobacco: Not on file   Substance Use Topics   . Alcohol use: Yes     Alcohol/week: 21.0 standard drinks of alcohol     Types: 21 Shots of liquor per week     Comment: 2-3 drinks a day, sometimes more                                Ready to quit: Not Answered  Counseling given: Not Answered      Vital Signs (Current):   Vitals:    10/12/21 0008 10/12/21 0349 10/12/21 0749 10/12/21 1051   BP: 124/66 (!) 107/51 104/79 116/73   Pulse: 72 68 73 56   Resp: 17 16 18 18    Temp: 97 F (36.1 C) 97.2 F (36.2 C) 97.2 F (36.2 C) 97.8 F (36.6 C)   TempSrc: Temporal Temporal Temporal Oral   SpO2: 100% 98%  97% 100%   Weight:       Height:                                                  BP Readings from Last 3 Encounters:   10/12/21 116/73   09/21/21 (!) 155/88   09/20/21 125/76       NPO Status: Time of last liquid consumption: 0940                        Time of last solid consumption: 2000                        Date of last liquid consumption: 10/12/21                        Date of last solid food consumption: 10/11/21    BMI:   Wt Readings from Last 3 Encounters:   10/05/21 202 lb 13.2 oz (92 kg)   10/07/21 202 lb (91.6 kg)   10/07/21 202 lb (91.6 kg)     Body mass index is 33.75  kg/m.    CBC:   Lab Results   Component Value Date/Time    WBC 6.3 10/12/2021 05:20 AM    RBC 3.57 10/12/2021 05:20 AM    HGB 9.1 10/12/2021 05:20 AM    HCT 25.9 10/12/2021 05:20 AM    MCV 72.5 10/12/2021 05:20 AM    RDW 43.8 10/12/2021 05:20 AM    PLT 210 10/12/2021 05:20 AM       CMP:   Lab Results   Component Value Date/Time    NA 138 10/12/2021 05:20 AM    K 3.7 10/12/2021 05:20 AM    CL 103 10/12/2021 05:20 AM    CO2 29 10/12/2021 05:20 AM    CO2 28 05/07/2020 03:25 AM    BUN <5 10/12/2021 05:20 AM    CREATININE 0.52 10/12/2021 05:20 AM    GFRAA >60.0 10/12/2021 05:20 AM    LABGLOM >60 10/12/2021 05:20 AM    GLUCOSE 94 10/12/2021 05:20 AM    PROT 7.7 02/21/2021 03:00 PM    CALCIUM 8.7 10/12/2021 05:20 AM    BILITOT 1.00 10/10/2021 12:41 PM    ALKPHOS 129 10/10/2021 12:41 PM    AST 211.0 10/10/2021 12:41 PM    ALT 130 10/10/2021 12:41 PM       POC Tests:   Recent Labs     10/09/21  1851   POCGLU 168*       Coags:   Lab Results   Component Value Date/Time    PROTIME 13.4 10/12/2021 05:20 AM    INR 1.1 10/12/2021 05:20 AM       HCG (If Applicable):   Lab Results   Component Value Date    PREGTESTUR negative 09/20/2021        ABGs: No results found for: "PHART", "PO2ART", "PCO2ART", "HCO3ART", "BEART", "O2SATART"     Type & Screen (If Applicable):  No results found for: "LABABO", "LABRH"    Drug/Infectious Status (If Applicable):  No results found for: "HIV", "HEPCAB"    COVID-19 Screening (If Applicable): No results found for: "COVID19"        Anesthesia Evaluation  Patient summary reviewed and Nursing notes reviewed  Airway: Mallampati:  II          Dental:          Pulmonary:Negative Pulmonary ROS breath sounds clear to auscultation  (+) current smoker                          ROS comment: H/o hyperemesis form marijuana improved  Pt states no marijuana or cocaine since 9/20       Cardiovascular:  Exercise tolerance: good (>4 METS),           Rhythm: regular  Rate: normal                    Neuro/Psych:    Negative Neuro/Psych ROS              GI/Hepatic/Renal: Neg GI/Hepatic/Renal ROS  (+) liver disease:,          ROS comment: Alcoholic induced chronic pancreatitis  Pancreatic pseudocyst 4 cm';s  No etoh since 9/20  \.   Endo/Other: Negative Endo/Other ROS                     ROS comment: H/o poly drug abuse including marijuana and cocaine   Abdominal:             Vascular:          Other Findings:           Anesthesia Plan      general     ASA 2       Induction: intravenous.      Anesthetic plan and risks discussed with patient.      Plan discussed with CRNA.                    Vickie Epley, MD   10/12/2021

## 2021-10-12 NOTE — Plan of Care (Signed)
Problem: Pain  Goal: Verbalizes/displays adequate comfort level or baseline comfort level  Outcome: Adequate for Discharge

## 2021-10-12 NOTE — Progress Notes (Signed)
Brief GI Note Post Procedure     ASSESSMENT:  --Abdominal Pain (Significantly Improved) with associated Nausea or Vomiting (Resolved) likely secondary to Cannabis Hyperemesis superimposed by Pancreatitis    -EGD 10/12/2021 per Dr. Lu Duffel normal   --Acute Alcohol induced Pancreatitis (Improving) with enlarging cystic pancreatic head lesion thought to be pseudocyst on MRI               -Hx of Recurrent Pancreatitis               -MRI/MRCP 10/07/21 notes enlarging cystic pancreatic head lesion 4.4x4.0cm. Pancreatic ductal dilatation, most pronounced within the pancreatic head 55mm. May be due to mild mass effect from the cystic lesion vs stricture. No MR evidence of acute pancreatitis. No significant intrahepatic or extrahepatic biliary ductal dilatation. CBD 24mm.              -S/p Cholecystectomy   --Microcytic Anemia suspect IDA in the setting of Menorrhagia-Hgb 9.1gm/dl                -LMP 09/28/21  --Hx of Polysubstance Dependence, Cocaine, Marijuana and Alcohol     PLAN:  --Monitor H&H. Transfuse as needed   --Recommend Iron Supplementation   --Advance to Low Fat Low Cholesterol Diet as tolerated   --Educated Pt about the risks of continued EtOH use and strongly encouraged the Pt to quit   --Okay for discharge from GI standpoint. Pt will need to follow up as an outpt with Dr. Lu Duffel in 4weeks at which time will coordinate possible repeat imaging +/- outpt elective EUS in 6-8weeks to further evaluate pancreatic head lesion identified on MRI  GI attending  I have independently seen and examined the patient. I have reviewed all pertinent labs and imaging. I independently created a management plan with discussion with , NP/PA-C and I agree with the note.     EGD today unremarkable. Diet as tolerated. Counseled to quit alcohol and drug use. Will sign off. Out pt gI follow up in 6 weeks . Will need out pt repeat MRI to evaluate pancreatic pseudocyst.Will sign off. TY       Carter Kitten, MD  [facility]  [place] [employer], FNP-C

## 2021-10-12 NOTE — Progress Notes (Signed)
TRANSFER - IN REPORT:    Verbal report received from [place], RN  on Rhonda Hammond  being received from PACU for routine post-op      Report consisted of patient's Situation, Background, Assessment and   Recommendations(SBAR).     Information from the following report(s) MAR and Recent Results was reviewed with the receiving nurse.    Opportunity for questions and clarification was provided.      Assessment completed upon patient's arrival to unit and care assumed.

## 2021-10-12 NOTE — Anesthesia Post-Procedure Evaluation (Signed)
Department of Anesthesiology  Postprocedure Note    Patient: Rhonda Hammond  MRN: [id number]  Birthdate: 03-Dec-1990  Date of evaluation: 10/12/2021      Procedure Summary     Date: 10/12/21 Room / Location: [facility] / [facility]    Anesthesia Start: 5284 Anesthesia Stop: 1203    Procedure: EGD DIAGNOSTIC (Upper GI Region) Diagnosis:       Bilious vomiting with nausea      Abdominal pain, unspecified abdominal location      Acute pancreatitis, unspecified complication status, unspecified pancreatitis type      (Bilious vomiting with nausea [R11.14])      (Abdominal pain, unspecified abdominal location [R10.9])      (Acute pancreatitis, unspecified complication status, unspecified pancreatitis type [K85.90])    Surgeons: Sharman Crate, MD Responsible Provider: Urban Gibson, MD    Anesthesia Type: General, TIVA ASA Status: 2          Anesthesia Type: General, TIVA    Aldrete Phase I:      Aldrete Phase II: Aldrete Score: 10      Anesthesia Post Evaluation    Patient location during evaluation: PACU  Patient participation: complete - patient participated  Level of consciousness: awake  Airway patency: patent  Nausea & Vomiting: no nausea and no vomiting  Complications: no  Cardiovascular status: blood pressure returned to baseline  Respiratory status: acceptable  Hydration status: euvolemic  Multimodal analgesia pain management approach  Pain management: satisfactory to patient

## 2021-10-12 NOTE — Discharge Summary (Signed)
Physician Discharge Summary     Patient ID:  Patient Name: Rhonda Hammond  Medical Record Number: [id number]  Date of Birth: [birth year]  Age: 31 y.o.    Primary Care Provider: None None    Code Status: Full Code     Admit date: 10/05/2021    Discharge Date:  10/12/2021    Admitting Physician: Rosalene Billings, MD    Discharge Physician: Kristen Loader, MD    Discharge Disposition: Home    Activity: activity as tolerated    Diet:  Low fat    Follow-up appointments:    None None    Schedule an appointment as soon as possible for a visit  Per pt, PCP is Dr. Edyth Gunnels.    Sharman Crate, MD  [address]  [place] [address]  [phone]    Follow up in 6 week(s)  call/make a f/u appointment      Follow-up recommendations:     Abstain from illicits and Alcohol     Follow-up with Pain Mgt     Follow-up with GI to get your Pancreatic EUS in 6-8 weeks    Patient Instructions:   Current Discharge Medication List        CONTINUE these medications which have CHANGED    Details   pantoprazole (PROTONIX) 40 MG tablet Take 1 tablet by mouth 2 times daily (before meals)  Qty: 60 tablet, Refills: 1           CONTINUE these medications which have NOT CHANGED    Details   famotidine (PEPCID) 20 MG tablet Take 1 tablet by mouth 2 times daily      promethazine (PHENERGAN) 25 MG tablet Take 1 tablet by mouth every 6 hours as needed for Nausea      !! ondansetron (ZOFRAN) 4 MG tablet Take 1 tablet by mouth every 8 hours as needed for Nausea or Vomiting  Qty: 20 tablet, Refills: 0      albuterol sulfate HFA (PROVENTIL;VENTOLIN;PROAIR) 108 (90 Base) MCG/ACT inhaler Inhale 2 puffs into the lungs every 6 hours as needed for Wheezing  Qty: 18 g, Refills: 3      dicyclomine (BENTYL) 20 MG tablet Take 1 tablet by mouth 4 times daily as needed (abdominal pain)  Qty: 20 tablet, Refills: 0      !! ondansetron (ZOFRAN) 4 MG tablet Take 1 tablet by mouth every 8 hours as needed for Nausea or Vomiting  Qty: 12 tablet, Refills:  0      haloperidol (HALDOL) 5 MG tablet Take 1 tablet by mouth 4 times daily as needed (abdominal pain)  Qty: 30 tablet, Refills: 0      albuterol (ACCUNEB) 0.63 MG/3ML nebulizer solution Inhale 0.63 mg into the lungs every 6 hours as needed       !! - Potential duplicate medications found. Please discuss with provider.        STOP taking these medications       omeprazole (PRILOSEC) 20 MG delayed release capsule Comments:   Reason for Stopping:               Admission Diagnoses: Chronic abdominal pain [R10.9, G64.40]  Chronic alcoholic pancreatitis ([place]) [K86.0]  Intractable nausea and vomiting [R11.2]  Acute on chronic pancreatitis ([place]) [K85.90, K86.1]    Discharge Diagnoses: see below    Admission Condition: fair    Discharged Condition: good    Hospital Course:       Acute pancreatitis  Advance diet to Low fat  Continue IVF and PRN pain meds  MRCP (9/22) whith larger 4x4.4cm pseudocyst  GI (Dr Frederik Pear) evaluated the pt and noted EGD (9/27) unremarkable  EUS of the pseudocyst as an outpt in 4-6 weeks  Lipase crept up slightly 107-->252 (9/23-->9/25) with initiation of the diet  Oxycodone 5mg  PO q6h PRN pain #20 no refill     Intractable N/V   Improved     Chronic abd pain  Likely chronic pancreatitis with acute exacerbation by pseudocyst  Pt to follow-up with pain mgt soon.     Hx pancreatic pseudocyst  Larger as above     Hx polysubstance dependence  Cocaine, MJ, alcohol in the past  Pt swears she has not been using anything recently  Recheck UDS (9/24) with MJ and opiates. Pt is getting opiates as an inpt so that can be disregarded.      Disposition  GI input appreciated.   EGD 9/27 unremarkable; Home today  Outpt EUS of the pancreas in 4-6 weeks    ------------------    65 minutes were spent on the discharge of the patient of which more than 50% was spent in coordination of care and counseling (time spent with patient/family face to face, physical exam, reviewing laboratory and imaging investigations, speaking  with physicians and nursing staff involved in this patient's care)    ------------------    Physical exam:  General: Alert, NAD, pleasant.  Cooperative  HEENT: Sclera anicteric, PERRL, OM moist, throat clear.  Chest: no wheeze, no rales, no rhonchi.  Good air movement.  CV: RRR, S1/S2 were normal, no murmur  Abdomen: NTND, soft, NABS, no masses were noted.  No rebound, no guarding.  Extremities: No edema.    Subjective:     Patient states/some abdominal pain but is better than before.  She notes that she will be following up with her pain management specialist.  She noted that she is spoken with her pain management specialist and cleared a small course of pain medications given her recent hospitalization.  We also discussed that she will need to follow-up with GI for pancreatic EUS.  We discussed that the EGD was unremarkable.      Vitals:    10/12/21 1244   BP: 137/74   Pulse: 65   Resp: 18   Temp: (!) 96.4 F (35.8 C)   SpO2: 100%         Initial presentation and work-up:  [As per Dr Ivonne Andrew Jasarevic's H+P from 10/05/2021]  #############  "  Chief Complaint   Patient presents with    Abdominal Pain            HPI:   Rhonda Hammond is a 31 y.o. year old female with PMH as noted above who presents with abdominal pain and vomiting. Patient has hx of chronic pancreatitis and follows with pain management. She went to ED three days ago with worsened abdominal pain. She has visited multiple EDs over past few days.    Lipase with mild elevation at 481 today.            Review of Systems - 12 Point ROS -ve except what is noted in the HPI.      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      Intractable nausea and vomiting 06/26/2020    Obesity 12/16/2018    Pancreatitis              Past Surgical History:  Past Surgical  History         Past Surgical History:   Procedure Laterality Date    CESAREAN SECTION        CHOLECYSTECTOMY                Family History:  Family History         Family History   Problem Relation Age of Onset    No Known Problems Mother              Social History:  Social History   Social History            Socioeconomic History    Marital status: Single   Tobacco Use    Smoking status: Every Day       Types: Cigars   Substance and Sexual Activity    Alcohol use: Yes       Alcohol/week: 21.0 standard drinks of alcohol       Types: 21 Shots of liquor per week       Comment: 2-3 drinks a day, sometimes more    Drug use: Yes       Types: Marijuana (Weed)            Home Medications:  Home Medications           Prior to Admission medications    Medication Sig Start Date End Date Taking? Authorizing Provider   ondansetron (ZOFRAN) 4 MG tablet Take 1 tablet by mouth every 8 hours as needed for Nausea or Vomiting 09/21/21     Apolonio Schneiders, APRN - NP   albuterol sulfate HFA (PROVENTIL;VENTOLIN;PROAIR) 108 (90 Base) MCG/ACT inhaler Inhale 2 puffs into the lungs every 6 hours as needed for Wheezing 06/07/21     Algis Downs, MD   dicyclomine (BENTYL) 20 MG tablet Take 1 tablet by mouth 4 times daily as needed (abdominal pain) 06/01/21     Rejeana Brock, APRN - NP   ondansetron (ZOFRAN) 4 MG tablet Take 1 tablet by mouth every 8 hours as needed for Nausea or Vomiting 06/01/21     Rejeana Brock, APRN - NP   haloperidol (HALDOL) 5 MG tablet Take 1 tablet by mouth 4 times daily as needed (abdominal pain) 05/09/21     Jaynie Collins, MD   pantoprazole (PROTONIX) 40 MG tablet Take 1 tablet by mouth 2 times daily (before meals) 05/09/21     Jaynie Collins, MD   albuterol (ACCUNEB) 0.63 MG/3ML nebulizer solution Inhale 0.63 mg into the lungs every 6 hours as needed       Ar Automatic Reconciliation            Allergies:  No Known Allergies        Physical Exam:   Visit Vitals  BP (!) 153/96   Pulse 98   Temp 98 F (36.7 C)  (Oral)   Resp 18   Ht 5\' 5"  (1.651 m)   Wt 202 lb 13.2 oz (92 kg)   SpO2 100%   BMI 33.75 kg/m         Physical Exam:  General appearance: asleep first, alert and cooperative after being woken up, no distress, appears stated age, comfortable at rest.  Head: Normocephalic, without obvious abnormality, atraumatic  Neck: supple, trachea midline  Lungs: clear to auscultation bilaterally  Heart: regular rate and rhythm, S1, S2 normal, no murmur, click, rub or gallop  Abdomen: soft, moderate tenderness to mild palpation of epigastric area. Bowel sounds normal. No masses,  no organomegaly  Extremities: extremities normal, atraumatic, no cyanosis or edema  Skin: Skin color, texture, turgor normal. No rashes or lesions  Neurologic: Grossly normal"  #############    ACUTE DIAGNOSES:  Chronic abdominal pain [R10.9, G89.29]  Chronic alcoholic pancreatitis (HCC) [K86.0]  Intractable nausea and vomiting [R11.2]  Acute on chronic pancreatitis ([facility]) [K85.90, K86.1]    CHRONIC MEDICAL DIAGNOSES:        Data Review:       Recent Days:  Recent Labs     10/12/21  0520   WBC 6.3   HGB 9.1*   HCT 25.9*   PLT 210     Recent Labs     10/10/21  1241 10/12/21  0520   NA 135* 138   K 4.0 3.7   CL 101 103   CO2 24 29   BUN <5* <5*   ALT 130*  --    INR  --  1.1     No results for input(s): "PH", "PCO2", "PO2", "HCO3", "FIO2" in the last 72 hours.    24 Hour Results:  Recent Results (from the past 24 hour(s))   CBC with Auto Differential    Collection Time: 10/12/21  5:20 AM   Result Value Ref Range    WBC 6.3 4.0 - 11.0 1000/mm3    RBC 3.57 (L) 3.60 - 5.20 M/uL    Hemoglobin 9.1 (L) 11.0 - 16.0 gm/dl    Hematocrit 38.1 (L) 35.0 - 47.0 %    MCV 72.5 (L) 80.0 - 98.0 fL    MCH 25.5 25.4 - 34.6 pg    MCHC 35.1 30.0 - 36.0 gm/dl    Platelets 829 937 - 450 1000/mm3    MPV 10.8 (H) 6.0 - 10.0 fL    RDW 43.8 36.4 - 46.3      Nucleated RBCs 0 0 - 0      Immature Granulocytes 0.2 0.0 - 3.0 %    Neutrophils Segmented 49.4 34 - 64 %    Lymphocytes 33.6  28 - 48 %    Monocytes 12.5 1 - 13 %    Eosinophils 4.1 0 - 5 %    Basophils 0.2 0 - 3 %   Basic Metabolic Panel    Collection Time: 10/12/21  5:20 AM   Result Value Ref Range    Potassium 3.7 3.5 - 5.1 mEq/L    Chloride 103 98 - 107 mEq/L    Sodium 138 136 - 145 mEq/L    CO2 29 20 - 31 mEq/L    Glucose 94 74 - 106 mg/dl    BUN <5 (L) 9 - 23 mg/dl    Creatinine 1.69 (L) 0.55 - 1.02 mg/dl    GFR African American >60.0      GFR Non-African American >60      Calcium 8.7 8.7 - 10.4 mg/dl    Anion Gap 6 5 - 15 mmol/L   Protime-INR    Collection Time: 10/12/21  5:20 AM   Result Value Ref Range    Protime 13.4 (H) 10.2 - 12.9 seconds    INR 1.1 0.1 - 1.1         MRI ABDOMEN W CONTRAST MRCP  Result Date: 10/07/2021  EXAM:  MRI abdomen with MRCP with and without contrast INDICATIONS: Pancreatitis TECHNIQUE: Multisequence, multiplanar MRI of the abdomen with and without contrast, including 3D MRCP sequences. 3d MRCP images were performed on the scanner and submitted for review. COMPARISON: 04/23/2021. WORKSTATION ID: [id number] FINDINGS: Cholecystectomy. No significant intrahepatic or extrahepatic biliary ductal dilatation. CBD measures up to 5 mm. Liver, spleen, adrenal glands and kidneys grossly unremarkable. Enlarging 4.4 x 4.0 cm pancreatic head cystic lesion with thick T2 hypointense surrounding rim. There appears to be some debris within the inferior aspect of the cystic lesion. Pancreatic ductal dilation dilatation, particularly about the head measuring up to 5 mm, which may be due to mass effect from the cystic lesion versus stricture in the setting of chronic pancreatitis. No significant peripancreatic inflammatory changes to suggest acute pancreatitis. Visualized bowel loops grossly unremarkable. No significant lymphadenopathy. Incomplete characterization of left ovarian cystic lesion, probably reflecting a dominant follicle versus cyst, measuring approximately 2.4 cm. Small, likely physiologic quantity of free  pelvic fluid.     IMPRESSION: 1. Enlarging cystic pancreatic head lesion, favoring pancreatic pseudocyst. Other pancreatic cystic neoplasm is not excluded. Continued surveillance is recommended. May consider endoscopic ultrasound. 2. Pancreatic ductal dilatation, most pronounced within the pancreatic head measuring up to 5 mm. This may be due to mild mass effect from the cystic lesion versus stricture as a result of chronic pancreatitis. 3. No MRI evidence of acute pancreatitis. 4. Cholecystectomy. Electronically signed by: Mikal Plane, MD 10/07/2021 8:54 PM EDT          Workstation ID: [id number]     US ABDOMEN LIMITED Specify organ? PANCREAS    Result Date: 10/05/2021  EXAMINATION: US ABDOMEN LIMITED CLINICAL INDICATION: intractable pain COMPARISON:  CT abdomen 04/23/2021;  TECHNIQUE: Abdominal ultrasound FINDINGS: Liver 16.9 cm. Increased echotexture. No discrete masses. No intrahepatic biliary duct dilatation. Gallbladder has been removed. Negative sonographic Murphy's sign. Common bile duct 5 mm. Pancreatic head cystic lesion 3.0 x 3.2 x 2.9 cm.  Pancreatic tail   partially obscured and cannot be evaluated. Proximal aorta and IVC Unremarkable. Right kidney 11.7 x 4.6 x 5.0 cm. Normal echotexture.     IMPRESSION: 1.  Pancreatic head cystic lesion. 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 Recommend follow-up evaluation by MRI with and without contrast. 2. Fatty liver. 3. Cholecystectomy. Electronically signed by: Earline Mayotte, MD 10/05/2021 4:45 AM EDT          Workstation ID: [id number]       Signed:  Alvy Bimler, MD  10/12/2021

## 2021-10-13 ENCOUNTER — Inpatient Hospital Stay
Admit: 2021-10-13 | Discharge: 2021-10-13 | Disposition: A | Discharge status: Home / Self Care | Payer: PRIVATE HEALTH INSURANCE | Attending: Student in an Organized Health Care Education/Training Program

## 2021-10-13 DIAGNOSIS — K861 Other chronic pancreatitis: Secondary | ICD-10-CM

## 2021-10-13 DIAGNOSIS — K859 Acute pancreatitis without necrosis or infection, unspecified: Secondary | ICD-10-CM

## 2021-10-13 LAB — POCT URINALYSIS DIPSTICK
Bilirubin, Urine: NEGATIVE
Blood, Urine: NEGATIVE
Glucose, Ur: NEGATIVE mg/dl
Ketones, Urine: 40 mg/dl — AB
Leukocyte Esterase, Urine: NEGATIVE
Nitrite, Urine: NEGATIVE
Protein, Urine: 30 mg/dl — AB
Specific Gravity, Urine: 1.015 (ref 1.005–1.030)
Urobilinogen, Urine: 2 EU/dl — ABNORMAL HIGH (ref 0.0–1.0)
pH, Urine: >=9 (ref 5–9)

## 2021-10-13 LAB — CBC WITH AUTO DIFFERENTIAL
Basophils: 0.1 % (ref 0–3)
Eosinophils: 0.2 % (ref 0–5)
Hematocrit: 30.2 % — ABNORMAL LOW (ref 35.0–47.0)
Hemoglobin: 10.7 gm/dl — ABNORMAL LOW (ref 11.0–16.0)
Immature Granulocytes: 0.3 % (ref 0.0–3.0)
Lymphocytes: 9.1 % — ABNORMAL LOW (ref 28–48)
MCH: 25.4 pg (ref 25.4–34.6)
MCHC: 35.4 gm/dl (ref 30.0–36.0)
MCV: 71.6 fL — ABNORMAL LOW (ref 80.0–98.0)
MPV: 10.5 fL — ABNORMAL HIGH (ref 6.0–10.0)
Monocytes: 6.5 % (ref 1–13)
Neutrophils Segmented: 83.8 % — ABNORMAL HIGH (ref 34–64)
Nucleated RBCs: 0 (ref 0–0)
Platelets: 254 10*3/uL (ref 140–450)
RBC: 4.22 M/uL (ref 3.60–5.20)
RDW: 42.5 (ref 36.4–46.3)
WBC: 9.2 10*3/uL (ref 4.0–11.0)

## 2021-10-13 LAB — COMPREHENSIVE METABOLIC PANEL
ALT: 36 U/L (ref 10–49)
AST: 15 U/L (ref 0.0–33.9)
Albumin: 4.1 gm/dl (ref 3.4–5.0)
Alkaline Phosphatase: 82 U/L (ref 46–116)
Anion Gap: 10 mmol/L (ref 5–15)
BUN: <5 mg/dl — ABNORMAL LOW (ref 9–23)
CO2: 26 mEq/L (ref 20–31)
Calcium: 9.9 mg/dl (ref 8.7–10.4)
Chloride: 103 mEq/L (ref 98–107)
Creatinine: 0.56 mg/dl (ref 0.55–1.02)
GFR African American: >60
GFR Non-African American: >60
Glucose: 110 mg/dl — ABNORMAL HIGH (ref 74–106)
Potassium: 3.8 mEq/L (ref 3.5–5.1)
Sodium: 139 mEq/L (ref 136–145)
Total Bilirubin: 0.5 mg/dl (ref 0.30–1.20)
Total Protein: 7.7 gm/dl (ref 5.7–8.2)

## 2021-10-13 LAB — POC CHEM 8
BUN: 3 mg/dl — ABNORMAL LOW (ref 7–25)
Calcium, Ionized: 3.7 mg/dL — ABNORMAL LOW (ref 4.40–5.40)
Chloride: 104 mEq/L (ref 98–107)
Creatinine: 0.4 mg/dl — ABNORMAL LOW (ref 0.6–1.3)
Glucose: 112 mg/dL — ABNORMAL HIGH (ref 74–106)
Hematocrit: 40 % (ref 38–45)
Hemoglobin: 13.6 gm/dl (ref 13.0–17.2)
Potassium: >9 mEq/L (ref 3.5–4.9)
Sodium: 131 mEq/L — ABNORMAL LOW (ref 136–145)
Total CO2: 27 mmol/L (ref 21–32)

## 2021-10-13 LAB — LIPASE: Lipase: 73 U/L — ABNORMAL HIGH (ref 12–53)

## 2021-10-13 LAB — POC PREGNANCY UR-QUAL: Pregnancy, Urine: NEGATIVE

## 2021-10-13 MED ORDER — SODIUM CHLORIDE 0.9 % IV BOLUS
0.9 % | Freq: Once | INTRAVENOUS | Status: AC
Start: 2021-10-13 — End: 2021-10-13
  Administered 2021-10-13: 20:00:00 1000 mL via INTRAVENOUS

## 2021-10-13 MED ORDER — ONDANSETRON HCL 4 MG/2ML IJ SOLN
4 MG/2ML | Freq: Once | INTRAMUSCULAR | Status: AC
Start: 2021-10-13 — End: 2021-10-13
  Administered 2021-10-13: 21:00:00 4 mg via INTRAVENOUS

## 2021-10-13 MED ORDER — HYDROMORPHONE HCL 1 MG/ML IJ SOLN
1 MG/ML | INTRAMUSCULAR | Status: AC
Start: 2021-10-13 — End: 2021-10-13
  Administered 2021-10-13: 21:00:00 1 mg via INTRAVENOUS

## 2021-10-13 MED ORDER — ONDANSETRON HCL 4 MG/2ML IJ SOLN
4 MG/2ML | Freq: Once | INTRAMUSCULAR | Status: AC
Start: 2021-10-13 — End: 2021-10-13
  Administered 2021-10-13: 20:00:00 4 mg via INTRAVENOUS

## 2021-10-13 MED ORDER — MORPHINE SULFATE 4 MG/ML IJ SOLN
4 MG/ML | INTRAMUSCULAR | Status: AC
Start: 2021-10-13 — End: 2021-10-13
  Administered 2021-10-13: 20:00:00 4 mg via INTRAVENOUS

## 2021-10-13 MED FILL — ONDANSETRON HCL 4 MG/2ML IJ SOLN: 4 MG/2ML | INTRAMUSCULAR | Qty: 2

## 2021-10-13 MED FILL — MORPHINE SULFATE 4 MG/ML IJ SOLN: 4 mg/mL | INTRAMUSCULAR | Qty: 1

## 2021-10-13 MED FILL — HYDROMORPHONE HCL 1 MG/ML IJ SOLN: 1 MG/ML | INTRAMUSCULAR | Qty: 1

## 2021-10-13 NOTE — ED Notes (Signed)
Patient ordered a medicaid cab, Network engineer given the information, patient waiting in the waiting room     Aleda Grana, RN  10/13/21 1734

## 2021-10-13 NOTE — ED Provider Notes (Incomplete)
[facility]  Emergency Department Treatment Report    Patient: Rhonda Hammond Age: 31 y.o. Sex: female    Date of Birth: May 25, 1990 Admit Date: 10/13/2021 PCP: None None   MRN: [id number]  CSN: [id number]     Room: ER46/ER46 Time Dictated: 5:10 PM      [facility]or: [facility] [place] / Plan: [facility] [place] / Product Type: *No Product type* /     Chief Complaint   Chief Complaint   Patient presents with    Nausea       History of Present Illness   31 y.o. 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     Intractable nausea and vomiting 06/26/2020    Obesity 12/16/2018    Pancreatitis      Past Surgical History:   Procedure Laterality Date    CESAREAN SECTION      CHOLECYSTECTOMY      UPPER GASTROINTESTINAL ENDOSCOPY N/A 10/12/2021    EGD DIAGNOSTIC performed by Elberta Spaniel, MD at [facility] ENDOSCOPY       Social History     Social History     Socioeconomic History    Marital status: Single   Tobacco Use    Smoking status: Every Day     Types: Cigars   Substance and Sexual Activity    Alcohol use: Yes     Alcohol/week: 21.0 standard drinks of alcohol     Types: 21 Shots of liquor per week     Comment: 2-3 drinks a day, sometimes more    Drug use: Yes     Types: Marijuana Sheran Fava)       Family History     Family History   Problem Relation Age of Onset    No Known Problems Mother        Current Medications     Previous Medications    ALBUTEROL (ACCUNEB) 0.63 MG/3ML NEBULIZER SOLUTION    Inhale 0.63 mg into the lungs every 6 hours as needed    ALBUTEROL SULFATE HFA (PROVENTIL;VENTOLIN;PROAIR) 108 (90 BASE) MCG/ACT INHALER    Inhale 2 puffs into the lungs every 6 hours as needed for Wheezing    DICYCLOMINE (BENTYL) 20 MG TABLET    Take 1 tablet by  mouth 4 times daily as needed (abdominal pain)    FAMOTIDINE (PEPCID) 20 MG TABLET    Take 1 tablet by mouth 2 times daily    HALOPERIDOL (HALDOL) 5 MG TABLET    Take 1 tablet by mouth 4 times daily as needed (abdominal pain)    ONDANSETRON (ZOFRAN) 4 MG TABLET    Take 1 tablet by mouth every 8 hours as needed for Nausea or Vomiting    ONDANSETRON (ZOFRAN) 4 MG TABLET    Take 1 tablet by mouth every 8 hours as needed for Nausea or Vomiting    OXYCODONE (ROXICODONE) 5 MG IMMEDIATE RELEASE TABLET    Take 1 tablet by mouth every 6 hours as needed for Pain for up to 5 days. Max Daily Amount: 20 mg    PANTOPRAZOLE (PROTONIX) 40 MG TABLET    Take 1 tablet by  mouth 2 times daily (before meals)    PROMETHAZINE (PHENERGAN) 25 MG TABLET    Take 1 tablet by mouth every 6 hours as needed for Nausea    UNABLE TO FIND    To whom it may concern:    Rhonda Hammond was admitted to [facility] 10/05/2021 until 10/12/2021. She may return to work on 10/14/2021 without restrictions.       Allergies     Allergies   Allergen Reactions    Droperidol Other (See Comments)     Face twitching, drooling and spontaneous leg movement       Physical Exam     ED Triage Vitals [10/13/21 1300]   BP Temp Temp Source Pulse Respirations SpO2 Height Weight   (!) 160/93 98.8 F (37.1 C) Oral 66 18 98 % -- --       Vitals:    10/13/21 1300   BP: (!) 160/93   Pulse: 66   Resp: 18   Temp: 98.8 F (37.1 C)   TempSrc: Oral   SpO2: 98%       I have reviewed documented vital signs, which are within age-appropriate parameters.   I have reviewed nursing documentation.    Physical Exam    Impression and Management Plan   This is a 31 y.o. female ***.    Differential diagnoses: *** among others.    Diagnostic Studies   Lab:   Results for orders placed or performed during the hospital encounter of 10/13/21   CBC with Auto Differential   Result Value Ref Range    WBC 9.2 4.0 - 11.0 1000/mm3    RBC 4.22 3.60 - 5.20 M/uL    Hemoglobin 10.7 (L)  11.0 - 16.0 gm/dl    Hematocrit 30.2 (L) 35.0 - 47.0 %    MCV 71.6 (L) 80.0 - 98.0 fL    MCH 25.4 25.4 - 34.6 pg    MCHC 35.4 30.0 - 36.0 gm/dl    Platelets 254 140 - 450 1000/mm3    MPV 10.5 (H) 6.0 - 10.0 fL    RDW 42.5 36.4 - 46.3      Nucleated RBCs 0 0 - 0      Immature Granulocytes 0.3 0.0 - 3.0 %    Neutrophils Segmented 83.8 (H) 34 - 64 %    Lymphocytes 9.1 (L) 28 - 48 %    Monocytes 6.5 1 - 13 %    Eosinophils 0.2 0 - 5 %    Basophils 0.1 0 - 3 %   CMP   Result Value Ref Range    Potassium 3.8 3.5 - 5.1 mEq/L    Chloride 103 98 - 107 mEq/L    Sodium 139 136 - 145 mEq/L    CO2 26 20 - 31 mEq/L    Glucose 110 (H) 74 - 106 mg/dl    BUN <5 (L) 9 - 23 mg/dl    Creatinine 0.56 0.55 - 1.02 mg/dl    GFR African American >60.0      GFR Non-African American >60      Calcium 9.9 8.7 - 10.4 mg/dl    Anion Gap 10 5 - 15 mmol/L    AST 15.0 0.0 - 33.9 U/L    ALT 36 10 - 49 U/L    Alkaline Phosphatase 82 46 - 116 U/L    Total Bilirubin 0.50 0.30 - 1.20 mg/dl    Total Protein 7.7 5.7 - 8.2 gm/dl    Albumin  4.1 3.4 - 5.0 gm/dl   Lipase   Result Value Ref Range    Lipase 73 (H) 12 - 53 U/L   POCT Urinalysis no Micro   Result Value Ref Range    Glucose, Ur Negative NEGATIVE,Negative mg/dl    Bilirubin, Urine Negative NEGATIVE,Negative      Ketones, Urine 40 (A) NEGATIVE,Negative mg/dl    Specific Gravity, Urine 1.015 1.005 - 1.030      Blood, Urine Negative NEGATIVE,Negative      pH, Urine >=9.0 5 - 9      Protein, Urine 30 (A) NEGATIVE,Negative mg/dl    Urobilinogen, Urine 2.0 (H) 0.0 - 1.0 EU/dl    Nitrite, Urine Negative NEGATIVE,Negative      Leukocyte Esterase, Urine Negative NEGATIVE,Negative      Color, UA Yellow      Clarity, UA Slightly Cloudy     POC Pregnancy Urine Qual   Result Value Ref Range    Pregnancy, Urine negative NEGATIVE,Negative,negative     POC CHEM 8   Result Value Ref Range    Sodium 131 (L) 136 - 145 mEq/L    Potassium >9.0 (HH) 3.5 - 4.9 mEq/L    Chloride 104 98 - 107 mEq/L    Total CO2 27 21 - 32  mmol/L    Glucose 112 (H) 74 - 106 mg/dL    BUN 3 (L) 7 - 25 mg/dl    Creatinine 0.4 (L) 0.6 - 1.3 mg/dl    Hematocrit 40 38 - 45 %    Hemoglobin 13.6 13.0 - 17.2 gm/dl    Calcium, Ionized 1.75 (L) 4.40 - 5.40 mg/dL       Based on my interpretation the *** shows ***.  I have reviewed all ordered labs and used them in my medical decision making (MDM) as documented below.    Imaging:    No results found.    Based on my interpretation the *** shows ***.    I have reviewed all ordered imaging studies and used them in my medical decision making (MDM) as documented below.    EKG Interpretation    An EKG was obtained in the evaluation of this patient at ***, and read at ***.  I have personally read and interpreted this EKG as follows:    EKG demonstrates *** at a rate of *** beats per minute with a *** axis. PR interval is *** at *** Rhonda.  QRS interval is *** at *** Rhonda. QT interval is *** at *** Rhonda. There are no ST segment or T wave changes suggestive of acute ischemia or infarction.    A prior EKG from *** was reviewed for comparison ***.    Johny Shock, DO  October 13, 2021    I have reviewed all ordered EKGs and used them in my medical decision making (MDM) as documented below.    ED Course     RECORDS REVIEWED: I reviewed the patient's previous records here at [facility] and available outside facilities and note that ***.     EXTERNAL RESULTS REVIEWED: ***.     INDEPENDENT HISTORIAN: History and/or plan development assisted by: ***     Severe exacerbation or progression of chronic illness: ***.    Threat to body function without evaluation and management: ***     SOCIAL DETERMINANTS impacting Evaluation and Management: ***    Income Level and Social Status  Social Catering manager  Employment and Working Conditions  Social Environments  Physical Environments  Personal Health Habits and Adaptability  Early Childhood Music therapist [employer]  [facility] services  Gender  Culture and  Lifestyle    Comorbidities impacting Evaluation and Management: ***    Procedures    Clinical Decision Tools   ***    Medical Decision Making   Chief Complaint: ***   Chief Complaint   Patient presents with    Nausea       31 y.o. female with *** with history and exam consistent with ***.    Initial considerations in this patient included *** among others.    ***    Case discussed with Dr. Marland Kitchen of *** with ***.    Case discussed with Dr. Marland Kitchen who agreed with planned admission to *** for ***.  Planned admission was discussed with *** who demonstrated understanding and agreement with this plan.    Prior to discharge we discussed return precautions, specifically for ***, treatment with ***, and follow up with primary care doctor *** for further evaluation, and the patient demonstrated understanding and agreement with this plan.    Medications   sodium chloride 0.9 % bolus 1,000 mL (1,000 mLs IntraVENous New Bag 10/13/21 1609)   morphine injection 4 mg (4 mg IntraVENous Given 10/13/21 1609)   ondansetron (ZOFRAN) injection 4 mg (4 mg IntraVENous Given 10/13/21 1609)       Final Diagnosis   No diagnosis found.    Disposition   ***Discharged home with recommendation for close outpatient follow-up.***  ***Admitted to the *** for ***.      No follow-up provider specified.     Johny Shock, DO  October 13, 2021    My signature above authenticates this document and my orders, the final    diagnosis (es), discharge prescription (s), and instructions in the Epic    record.  If you have any questions please contact [phone].     Nursing notes have been reviewed by the physician/ advanced practice    Clinician.    Dragon medical dictation software was used for portions of this report. Unintended voice recognition grammatical errors may occur.

## 2021-10-13 NOTE — Discharge Instructions (Signed)
Based on our clinical examination in the emergency department today, we feel you are safe for discharge home with the follow up recommendations noted above. You should return to the emergency department if your condition worsens significantly, fails to improve as we discussed, or if you note any new or concerning symptoms.    The following labs were obtained during your emergency department evaluation, which you should bring to your next primary care provider appointment for follow up:    Results for orders placed or performed during the hospital encounter of 10/13/21   CBC with Auto Differential   Result Value Ref Range    WBC 9.2 4.0 - 11.0 1000/mm3    RBC 4.22 3.60 - 5.20 M/uL    Hemoglobin 10.7 (L) 11.0 - 16.0 gm/dl    Hematocrit 30.2 (L) 35.0 - 47.0 %    MCV 71.6 (L) 80.0 - 98.0 fL    MCH 25.4 25.4 - 34.6 pg    MCHC 35.4 30.0 - 36.0 gm/dl    Platelets 254 140 - 450 1000/mm3    MPV 10.5 (H) 6.0 - 10.0 fL    RDW 42.5 36.4 - 46.3      Nucleated RBCs 0 0 - 0      Immature Granulocytes 0.3 0.0 - 3.0 %    Neutrophils Segmented 83.8 (H) 34 - 64 %    Lymphocytes 9.1 (L) 28 - 48 %    Monocytes 6.5 1 - 13 %    Eosinophils 0.2 0 - 5 %    Basophils 0.1 0 - 3 %   CMP   Result Value Ref Range    Potassium 3.8 3.5 - 5.1 mEq/L    Chloride 103 98 - 107 mEq/L    Sodium 139 136 - 145 mEq/L    CO2 26 20 - 31 mEq/L    Glucose 110 (H) 74 - 106 mg/dl    BUN <5 (L) 9 - 23 mg/dl    Creatinine 0.56 0.55 - 1.02 mg/dl    GFR African American >60.0      GFR Non-African American >60      Calcium 9.9 8.7 - 10.4 mg/dl    Anion Gap 10 5 - 15 mmol/L    AST 15.0 0.0 - 33.9 U/L    ALT 36 10 - 49 U/L    Alkaline Phosphatase 82 46 - 116 U/L    Total Bilirubin 0.50 0.30 - 1.20 mg/dl    Total Protein 7.7 5.7 - 8.2 gm/dl    Albumin 4.1 3.4 - 5.0 gm/dl   Lipase   Result Value Ref Range    Lipase 73 (H) 12 - 53 U/L   POCT Urinalysis no Micro   Result Value Ref Range    Glucose, Ur Negative NEGATIVE,Negative mg/dl    Bilirubin, Urine Negative  NEGATIVE,Negative      Ketones, Urine 40 (A) NEGATIVE,Negative mg/dl    Specific Gravity, Urine 1.015 1.005 - 1.030      Blood, Urine Negative NEGATIVE,Negative      pH, Urine >=9.0 5 - 9      Protein, Urine 30 (A) NEGATIVE,Negative mg/dl    Urobilinogen, Urine 2.0 (H) 0.0 - 1.0 EU/dl    Nitrite, Urine Negative NEGATIVE,Negative      Leukocyte Esterase, Urine Negative NEGATIVE,Negative      Color, UA Yellow      Clarity, UA Slightly Cloudy     POC Pregnancy Urine Qual   Result Value Ref Range    Pregnancy,  Urine negative NEGATIVE,Negative,negative     POC CHEM 8   Result Value Ref Range    Sodium 131 (L) 136 - 145 mEq/L    Potassium >9.0 (HH) 3.5 - 4.9 mEq/L    Chloride 104 98 - 107 mEq/L    Total CO2 27 21 - 32 mmol/L    Glucose 112 (H) 74 - 106 mg/dL    BUN 3 (L) 7 - 25 mg/dl    Creatinine 0.4 (L) 0.6 - 1.3 mg/dl    Hematocrit 40 38 - 45 %    Hemoglobin 13.6 13.0 - 17.2 gm/dl    Calcium, Ionized 1.61 (L) 4.40 - 5.40 mg/dL       The following imaging studies were obtained during your emergency department evaluation, which you should bring to your next primary care provider appointment for follow up:    MRI ABDOMEN W CONTRAST MRCP    Result Date: 10/07/2021  EXAM:  MRI abdomen with MRCP with and without contrast INDICATIONS: Pancreatitis TECHNIQUE: Multisequence, multiplanar MRI of the abdomen with and without contrast, including 3D MRCP sequences. 3d MRCP images were performed on the scanner and submitted for review. COMPARISON: 04/23/2021. WORKSTATION ID: [id number] FINDINGS: Cholecystectomy. No significant intrahepatic or extrahepatic biliary ductal dilatation. CBD measures up to 5 mm. Liver, spleen, adrenal glands and kidneys grossly unremarkable. Enlarging 4.4 x 4.0 cm pancreatic head cystic lesion with thick T2 hypointense surrounding rim. There appears to be some debris within the inferior aspect of the cystic lesion. Pancreatic ductal dilation dilatation, particularly about the head measuring up to 5 mm,  which may be due to mass effect from the cystic lesion versus stricture in the setting of chronic pancreatitis. No significant peripancreatic inflammatory changes to suggest acute pancreatitis. Visualized bowel loops grossly unremarkable. No significant lymphadenopathy. Incomplete characterization of left ovarian cystic lesion, probably reflecting a dominant follicle versus cyst, measuring approximately 2.4 cm. Small, likely physiologic quantity of free pelvic fluid.     IMPRESSION: 1. Enlarging cystic pancreatic head lesion, favoring pancreatic pseudocyst. Other pancreatic cystic neoplasm is not excluded. Continued surveillance is recommended. May consider endoscopic ultrasound. 2. Pancreatic ductal dilatation, most pronounced within the pancreatic head measuring up to 5 mm. This may be due to mild mass effect from the cystic lesion versus stricture as a result of chronic pancreatitis. 3. No MRI evidence of acute pancreatitis. 4. Cholecystectomy. Electronically signed by: Mikal Plane, MD 10/07/2021 8:54 PM EDT          Workstation ID: [id number]     US ABDOMEN LIMITED Specify organ? PANCREAS    Result Date: 10/05/2021  EXAMINATION: US ABDOMEN LIMITED CLINICAL INDICATION: intractable pain COMPARISON:  CT abdomen 04/23/2021;  TECHNIQUE: Abdominal ultrasound FINDINGS: Liver 16.9 cm. Increased echotexture. No discrete masses. No intrahepatic biliary duct dilatation. Gallbladder has been removed. Negative sonographic Murphy's sign. Common bile duct 5 mm. Pancreatic head cystic lesion 3.0 x 3.2 x 2.9 cm.  Pancreatic tail   partially obscured and cannot be evaluated. Proximal aorta and IVC Unremarkable. Right kidney 11.7 x 4.6 x 5.0 cm. Normal echotexture.     IMPRESSION: 1.  Pancreatic head cystic lesion. 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 Recommend follow-up evaluation by MRI with and without contrast. 2. Fatty liver. 3. Cholecystectomy. Electronically signed by: Earline Mayotte, MD  10/05/2021 4:45 AM EDT          Workstation ID: [id number]

## 2021-10-13 NOTE — ED Triage Notes (Signed)
Pt brought in by EMS with c/o nausea.  Pt was seen here last night ans dx with appendicitis. Pt took her oxy on empty stomach and is now having nausea.

## 2021-10-19 ENCOUNTER — Inpatient Hospital Stay
Admit: 2021-10-19 | Discharge: 2021-10-20 | Disposition: A | Discharge status: Home / Self Care | Payer: PRIVATE HEALTH INSURANCE | Attending: Emergency Medicine

## 2021-10-19 DIAGNOSIS — K859 Acute pancreatitis without necrosis or infection, unspecified: Secondary | ICD-10-CM

## 2021-10-19 DIAGNOSIS — R1013 Epigastric pain: Secondary | ICD-10-CM

## 2021-10-19 LAB — CBC WITH AUTO DIFFERENTIAL
Basophils: 0.3 % (ref 0–3)
Eosinophils: 1.6 % (ref 0–5)
Hematocrit: 31.6 % — ABNORMAL LOW (ref 35.0–47.0)
Hemoglobin: 10.7 gm/dl — ABNORMAL LOW (ref 11.0–16.0)
Immature Granulocytes: 0.3 % (ref 0.0–3.0)
Lymphocytes: 17.8 % — ABNORMAL LOW (ref 28–48)
MCH: 24.3 pg — ABNORMAL LOW (ref 25.4–34.6)
MCHC: 33.9 gm/dl (ref 30.0–36.0)
MCV: 71.8 fL — ABNORMAL LOW (ref 80.0–98.0)
MPV: 10.3 fL — ABNORMAL HIGH (ref 6.0–10.0)
Monocytes: 7.6 % (ref 1–13)
Neutrophils Segmented: 72.4 % — ABNORMAL HIGH (ref 34–64)
Nucleated RBCs: 0 (ref 0–0)
Platelets: 318 10*3/uL (ref 140–450)
RBC: 4.4 M/uL (ref 3.60–5.20)
RDW: 42.8 (ref 36.4–46.3)
WBC: 9.8 10*3/uL (ref 4.0–11.0)

## 2021-10-19 MED ORDER — HYDROMORPHONE HCL 1 MG/ML IJ SOLN
1 MG/ML | INTRAMUSCULAR | Status: AC
Start: 2021-10-19 — End: 2021-10-19
  Administered 2021-10-20: 01:00:00 1 mg via INTRAVENOUS

## 2021-10-19 MED ORDER — MORPHINE SULFATE 4 MG/ML IJ SOLN
4 MG/ML | INTRAMUSCULAR | Status: DC
Start: 2021-10-19 — End: 2021-10-19

## 2021-10-19 MED ORDER — SODIUM CHLORIDE 0.9 % IV BOLUS
0.9 % | Freq: Once | INTRAVENOUS | Status: AC
Start: 2021-10-19 — End: 2021-10-19
  Administered 2021-10-20: 01:00:00 1000 mL via INTRAVENOUS

## 2021-10-19 MED ORDER — ONDANSETRON HCL 4 MG/2ML IJ SOLN
4 MG/2ML | Freq: Once | INTRAMUSCULAR | Status: AC
Start: 2021-10-19 — End: 2021-10-19
  Administered 2021-10-20: 01:00:00 4 mg via INTRAVENOUS

## 2021-10-19 NOTE — ED Notes (Signed)
Urine specimen collected, tested, and sent to lab     Margorie John, RN  10/19/21 2158

## 2021-10-19 NOTE — ED Notes (Signed)
pt medicated per MAR . medications verified and checked . patient ID and allergies verified with patient and cross checked with ID band prior to administration . will continue to monitor and reasses . patient verbalized understanding of medication...       Margorie John, [place]  10/19/21 2158

## 2021-10-19 NOTE — ED Provider Notes (Signed)
[facility]  Emergency Department Treatment Report    Patient: Rhonda Hammond Age: 31 y.o. Sex: female    Date of Birth: July 06, 1990 Admit Date: 10/19/2021 PCP: Lisbeth Renshaw, MD   MRN: [id number]  CSN: [id number]  Lindajo Royal, MD   Room: [phone] Time Dictated: 9:44 PM Cindy Brindisi       Chief Complaint     Chief Complaint   Patient presents with    Abdominal Pain       History of Present Illness     This is a 31 y.o. female presenting for further evaluation the emergency department questing analgesic medications.  She states she was recently discharged from the emergency department after an admission for pancreatitis.  She states Dilaudid tends to work best for her pain.  Patient states she already has a prescription for oxycodone, but was unable to pick it up before tomorrow morning.  Patient denies vomiting hematemesis melena or hematochezia, does complain of diffuse abdominal pain.    Review of Systems     Constitutional:  No fevers or chills.  Eyes: No visual symptoms.  ENT: No sore throat, runny nose or ear pain.  Respiratory: No cough, dyspnea or wheezing.  Cardoivascular: No chest pain, pressure, palpitations, tightness or heaviness.  Gastrointestinal: Patient feels constipated.  Abdominal pain as above.  Genitourinary: No dysuria, frequency, or urgency.  Musculoskeletal: No joint pain or swelling.  Integumentary: No rashes.  Neurological: No headaches, sensory or motor symptoms.      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     Intractable nausea and vomiting 06/26/2020    Obesity 12/16/2018    Pancreatitis      Past Surgical History:   Procedure Laterality Date    CESAREAN SECTION      CHOLECYSTECTOMY      UPPER GASTROINTESTINAL ENDOSCOPY N/A 10/12/2021    EGD DIAGNOSTIC performed by Sharman Crate, MD at  [facility] History     Socioeconomic History    Marital status: Single   Tobacco Use    Smoking status: Every Day     Types: Cigars   Substance and Sexual Activity    Alcohol use: Yes     Alcohol/week: 21.0 standard drinks of alcohol     Types: 21 Shots of liquor per week     Comment: 2-3 drinks a day, sometimes more    Drug use: Yes     Types: Marijuana Sherrie Mustache)       Family History     Family History   Problem Relation Age of Onset    No Known Problems Mother          Current Medications     Current Facility-Administered Medications   Medication Dose Route Frequency Provider Last Rate Last Admin    sodium chloride 0.9 % bolus 1,000 mL  1,000 mL IntraVENous Once Chesley Noon, PA-C 495.9 mL/hr at 10/19/21 2055 1,000 mL at 10/19/21 2055    Magnesium Citrate solution 148 mL  148 mL Oral Once Chesley Noon, PA-C         Current Outpatient Medications   Medication Sig Dispense Refill    pantoprazole (PROTONIX) 40 MG tablet  Take 1 tablet by mouth 2 times daily (before meals) 60 tablet 1    UNABLE TO FIND To whom it may concern:    Ms Rhonda Hammond was admitted to [facility] 10/05/2021 until 10/12/2021. She may return to work on 10/14/2021 without restrictions. 1 each 0    famotidine (PEPCID) 20 MG tablet Take 1 tablet by mouth 2 times daily      promethazine (PHENERGAN) 25 MG tablet Take 1 tablet by mouth every 6 hours as needed for Nausea      ondansetron (ZOFRAN) 4 MG tablet Take 1 tablet by mouth every 8 hours as needed for Nausea or Vomiting 20 tablet 0    albuterol sulfate HFA (PROVENTIL;VENTOLIN;PROAIR) 108 (90 Base) MCG/ACT inhaler Inhale 2 puffs into the lungs every 6 hours as needed for Wheezing 18 g 3    dicyclomine (BENTYL) 20 MG tablet Take 1 tablet by mouth 4 times daily as needed (abdominal pain) 20 tablet 0    ondansetron (ZOFRAN) 4 MG tablet Take 1 tablet by mouth every 8 hours as needed for Nausea or Vomiting 12 tablet 0    haloperidol  (HALDOL) 5 MG tablet Take 1 tablet by mouth 4 times daily as needed (abdominal pain) 30 tablet 0    albuterol (ACCUNEB) 0.63 MG/3ML nebulizer solution Inhale 0.63 mg into the lungs every 6 hours as needed         Allergies     Allergies   Allergen Reactions    Droperidol Other (See Comments)     Face twitching, drooling and spontaneous leg movement       Physical Exam     BP (!) 142/92   Pulse (!) 104   Temp 99 F (37.2 C) (Oral)   Resp 18   Ht 1.651 m   Wt 92.1 kg   LMP 09/22/2021   SpO2 98%   BMI 33.78 kg/m     Constitutional: Patient appears well developed and well nourished. Marland Kitchen Appearance and behavior are age and situation appropriate.  Eyes: Conjutivae clear, lids normal. Pupils equal, symmetrical, and normally reactive.  HEENT: Mucous membranes moist, non-erythematous. Surface of the pharynx, palate, and tongue are pink, moist and without lesions.  Neck: supple, non tender, symmetrical, no masses or meningismus.   Respiratory: lungs clear to auscultation, nonlabored respirations. No tachypnea or accessory muscle use.  Cardiovascular: heart regular rate and rhythm without murmur rubs or gallops.   Calves soft and non-tender. Distal pulses 2+ and equal bilaterally.  No peripheral edema or significant variscosities.    Gastrointestinal: Mild diffuse distractible tenderness no rebound no guarding.  No significant focal tenderness with distraction.  Musculoskeletal: Nail beds pink with prompt capillary refill  Integumentary: warm and dry without rashes or lesions  Neurologic: alert and oriented, Sensation intact, motor strength equal and symmetric.  No facial asymmetry or dysarthria.        Impression and Management Plan     Repeat screening GI laboratory studies to trend patient's lipase, medicate symptomatically and hydrate.  She states she already has a prescription for pain medications.    Differential Diagnoses: As above    Procedures       Diagnostic Studies       Lab:     Results for orders placed  or performed during the hospital encounter of 10/19/21   CBC with Diff   Result Value Ref Range    WBC 9.8 4.0 - 11.0 1000/mm3    RBC 4.40 3.60 -  5.20 M/uL    Hemoglobin 10.7 (L) 11.0 - 16.0 gm/dl    Hematocrit 29.5 (L) 35.0 - 47.0 %    MCV 71.8 (L) 80.0 - 98.0 fL    MCH 24.3 (L) 25.4 - 34.6 pg    MCHC 33.9 30.0 - 36.0 gm/dl    Platelets 188 416 - 450 1000/mm3    MPV 10.3 (H) 6.0 - 10.0 fL    RDW 42.8 36.4 - 46.3      Nucleated RBCs 0 0 - 0      Immature Granulocytes 0.3 0.0 - 3.0 %    Neutrophils Segmented 72.4 (H) 34 - 64 %    Lymphocytes 17.8 (L) 28 - 48 %    Monocytes 7.6 1 - 13 %    Eosinophils 1.6 0 - 5 %    Basophils 0.3 0 - 3 %    Narrative    * ATTENTION *   CMP   Result Value Ref Range    Potassium 5.1 3.5 - 5.1 mEq/L    Chloride 104 98 - 107 mEq/L    Sodium 137 136 - 145 mEq/L    CO2 23 20 - 31 mEq/L    Glucose 94 74 - 106 mg/dl    BUN <5 (L) 9 - 23 mg/dl    Creatinine 6.06 3.01 - 1.02 mg/dl    GFR African American >60.0      GFR Non-African American >60      Calcium 9.8 8.7 - 10.4 mg/dl    Anion Gap 10 5 - 15 mmol/L    AST 40.0 (H) 0.0 - 33.9 U/L    ALT 14 10 - 49 U/L    Alkaline Phosphatase 70 46 - 116 U/L    Total Bilirubin 0.50 0.30 - 1.20 mg/dl    Total Protein 8.1 5.7 - 8.2 gm/dl    Albumin 4.5 3.4 - 5.0 gm/dl   Lipase   Result Value Ref Range    Lipase 49 12 - 53 U/L       EKG:      Other Studies:   My interpretation of other studies is that they show, among other things, hemoglobin stable since discharge of 10.7.  I-STAT hemoglobin at that time was higher but this is less reliable..      ED Course/Additional MDM     ED Course as of 10/19/21 2144   Wed Oct 19, 2021   2143 Patient informed me she felt like her stool was at the anal verge but was just too large to pass out.  I attempted to try to convince her may she may need a rectal examination to try to disimpact, but patient declines this. [BH]      ED Course User Index  [BH] Renella Cunas, PA-C       INTERNAL/EXTERNAL RECORDS REVIEWED: I  reviewed the patient's previous records here at [facility] and available outside facilities and note that patient discharged 10/12/2021 after admission for chronic abdominal pain alcoholic pancreatitis nausea and vomiting.     INDEPENDENT HISTORIAN: History and/or plan development assisted by: N/A     Severe exacerbation or progression of chronic illness: yes, chronic abdominal pain.     Threat to body function without evaluation and management: No     Social Determinants impacting E&M: None    Comorbidities impacting Evaluation and Management: None    Critical Care Time: None    Additional Providers Consulted: None     I considered the following  testing, treatment, or disposition: CT  but decided not to pursue due to around 8 having been done this year already for patient's chronic pain, and stable WBC/Hgb and downtrending lipase.      Medications   sodium chloride 0.9 % bolus 1,000 mL (1,000 mLs IntraVENous New Bag 10/19/21 2055)   Magnesium Citrate solution 148 mL (has no administration in time range)   ondansetron [facility]) injection 4 mg (4 mg IntraVENous Given 10/19/21 2055)   HYDROmorphone (DILAUDID) injection 1 mg (1 mg IntraVENous Given 10/19/21 2055)       Final Diagnosis       ICD-10-CM    1. Abdominal pain, epigastric  R10.13            Disposition     Patient was discharged home in stable condition with discharge instructions on the same.     Return to the ER if condition worsens or new symptoms develop.   Follow up with primary care as discussed.     The patient was personally evaluated by myself and discussed with Erling Conte, MD who agrees with the above assessment and plan.      Renella Cunas, PA-C  October 19, 2021    My signature above authenticates this document and my orders, the final    diagnosis (es), discharge prescription (s), and instructions in the Epic    record.  If you have any questions please contact [phone].     Nursing notes have been reviewed by the physician/ advanced practice     Clinician.                         Renella Cunas, [place]  10/19/21 2356

## 2021-10-19 NOTE — ED Triage Notes (Signed)
Pt presents to ED via EMS transport for eval of abdominal pain x a couple days. Pt ambulated into ED with EMS. Pt has a history of pancreatitis.

## 2021-10-19 NOTE — Discharge Instructions (Signed)
Magnesium citrate for constipation if needed.  Follow up with your gastroenterologist.  Return with new/worsening symptoms or concerns.

## 2021-10-19 NOTE — ED Notes (Signed)
Pt discharged by provider.     Beacher May, RN  10/19/21 2208

## 2021-10-20 LAB — COMPREHENSIVE METABOLIC PANEL
ALT: 14 U/L (ref 10–49)
AST: 40 U/L — ABNORMAL HIGH (ref 0.0–33.9)
Albumin: 4.5 gm/dl (ref 3.4–5.0)
Alkaline Phosphatase: 70 U/L (ref 46–116)
Anion Gap: 10 mmol/L (ref 5–15)
BUN: <5 mg/dl — ABNORMAL LOW (ref 9–23)
CO2: 23 mEq/L (ref 20–31)
Calcium: 9.8 mg/dl (ref 8.7–10.4)
Chloride: 104 mEq/L (ref 98–107)
Creatinine: 0.63 mg/dl (ref 0.55–1.02)
GFR African American: >60
GFR Non-African American: >60
Glucose: 94 mg/dl (ref 74–106)
Potassium: 5.1 mEq/L (ref 3.5–5.1)
Sodium: 137 mEq/L (ref 136–145)
Total Bilirubin: 0.5 mg/dl (ref 0.30–1.20)
Total Protein: 8.1 gm/dl (ref 5.7–8.2)

## 2021-10-20 LAB — LIPASE: Lipase: 49 U/L (ref 12–53)

## 2021-10-20 LAB — POCT URINALYSIS DIPSTICK
Bilirubin, Urine: NEGATIVE
Blood, Urine: NEGATIVE
Glucose, Ur: NEGATIVE mg/dl
Ketones, Urine: 15 mg/dl — AB
Leukocyte Esterase, Urine: NEGATIVE
Nitrite, Urine: NEGATIVE
Specific Gravity, Urine: 1.02 (ref 1.005–1.030)
Urobilinogen, Urine: 1 EU/dl (ref 0.0–1.0)
pH, Urine: 6 (ref 5–9)

## 2021-10-20 LAB — POC PREGNANCY UR-QUAL: Pregnancy, Urine: NEGATIVE

## 2021-10-20 MED ORDER — MAGNESIUM CITRATE 1.745 GM/30ML PO SOLN
1.745 GM/30ML | Freq: Once | ORAL | Status: AC
Start: 2021-10-20 — End: 2021-10-19
  Administered 2021-10-20: 02:00:00 148 mL via ORAL

## 2021-10-20 MED FILL — HYDROMORPHONE HCL 1 MG/ML IJ SOLN: 1 MG/ML | INTRAMUSCULAR | Qty: 1

## 2021-10-20 MED FILL — GNP MAGNESIUM CITRATE 1.745 GM/30ML PO SOLN: 1.745 GM/30ML | ORAL | Qty: 148

## 2021-10-20 MED FILL — ONDANSETRON HCL 4 MG/2ML IJ SOLN: 4 MG/2ML | INTRAMUSCULAR | Qty: 2

## 2021-12-14 NOTE — ED Notes (Signed)
Formatting of this note might be different from the original.  Pain assessment on discharge was tolerable.  Condition stable.  Patient discharged to home.  Patient education was completed:  yes  Education taught to:  patient  Teaching method used was discussion and handout.  Understanding of teaching was fair.  Patient was discharged ambulatory.  Discharged with self.  Valuables were given to: patient.    Electronically signed by Seward Speck, LPN at 66/59/9357  0:17 PM EST

## 2021-12-14 NOTE — ED Notes (Signed)
Formatting of this note is different from the original.     12/14/21 1833   Vital Signs   Temp 97.5 F (36.4 C)   Temp Source Tympanic   Heart Rate 100   Resp 18   BP 142/106   BP Mean (!) 118 MM HG   SpO2 100 %   Room Air Yes   Device (Oxygen Therapy) room air   Height 5\' 5"  (1.651 m)   Weight 96.6 kg (213 lb)   Weight Method  Stated   BMI (Calculated) 35.44   Pain/Comfort/Sleep   (0-10) Pain Rating: Rest 10   Vital Signs   VSA Score ([employer]) 1       Electronically signed by Carolina Cellar. at 12/14/2021  6:35 PM EST

## 2021-12-14 NOTE — ED Provider Notes (Signed)
Formatting of this note is different from the original.    [place]    Time of Arrival:   12/14/21 1831    Final diagnoses:   [R10.13] Epigastric pain (Primary)   [F10.10] Alcohol abuse     Medical Decision Making:      Differential Diagnosis:   Pancreatitis, chronic abdominal pain, bowel obstruction, gastroparesis, malignancy, gastritis    Social Determinants of Health:  social factors reviewed, did not limit treatment        Patient has no primary care and has history of substance abuse                        Supplemental Historians include:  patient    ED Course: Patient is a 31 year old female history of alcohol abuse and recurrent pancreatitis who does smoke daily who presents for abdominal pain.  Patient reports pain began yesterday.  It is severe.  It is consistent with her prior episodes of pancreatitis.  It is in her epigastrium radiates to her back.  Is associated with nausea and vomiting and inability to take p.o.  She tried BC powder and Tylenol which have not helped.  Otherwise she denies any other symptoms.  She states that she has not been drinking for the past couple weeks.    Here, patient is mildly tachycardic.  She does have tenderness palpation in her epigastrium.  She is otherwise in no distress.  She is not actively vomiting.  Possibly pancreatitis versus drug-seeking behavior versus gastritis.  Will check labs.  Will give IV fluids.  Will give antiemetics and a single dose of pain medication.  Also check alcohol level.  If all reassuring, patient to go home with strict return precautions.        Given this is a chronic issue and patient is not febrile and does not have any signs of hemodynamic compromise, do not think patient needs advanced imaging at this time such as CT.    Patient is lab work reassuring including lipase.  Former CT scans reviewed which show patient had walled off necrosis of the pancreas.  Do not think patient is infected at this time.   Patient could simply have gastritis given her alcohol use.  Home with Zofran and brief pain control return precautions.  Documentation/Prior Results Review:  Old medical records, Nursing notes  CBC, BMP and LFTs as well as lipase from 12/01/2021 reviewed  Rhythm interpretation from monitor: N/A    Imaging Interpreted by me: Not Applicable    No orders to display     .     Discussion of Management with other Physicians, QHP or Appropriate Source:   None    .    Disposition:  Home    New Prescriptions    OXYCODONE-ACETAMINOPHEN (PERCOCET) 5-325 MG PO TABS    Take 1 Tab by Mouth Every 6 Hours As Needed.     Chief Complaint   Patient presents with    ABDOMINAL PAIN     HPI     Review of Systems    Physical Exam  Vitals and nursing note reviewed.   HENT:      Head: Normocephalic.      Mouth/Throat:      Mouth: Mucous membranes are moist.   Eyes:      Conjunctiva/sclera: Conjunctivae normal.   Cardiovascular:      Rate and Rhythm: Tachycardia present.   Pulmonary:  Effort: Pulmonary effort is normal.   Abdominal:      Comments: Mild epigastric tenderness to palpation no rebound or guarding   Musculoskeletal:         General: Normal range of motion.      Cervical back: Neck supple.   Skin:     General: Skin is warm.   Neurological:      General: No focal deficit present.      Mental Status: She is alert.   Psychiatric:         Mood and Affect: Mood normal.     Past Medical History:   Diagnosis Date    Alcohol-induced acute pancreatitis     no longer drinks EtOH regularly    Class 1 obesity with body mass index (BMI) of 32.0 to 32.9 in adult     Gallstones     per pt report     Past Surgical History:   Procedure Laterality Date    LAP CHOLECYSTECTOMY N/A 02/10/2021    Procedure: CHOLECYSTECTOMY, LAPAROSCOPIC, WITH CHOLANGIOGRAM;  Surgeon: Wynelle Fanny, MD     Family History   Problem Relation Age of Onset    No Known Problems Mother     Other Family History Father         died from trauma     Social History      Occupational History    Not on file   Tobacco Use    Smoking status: Every Day     Types: Cigarettes    Smokeless tobacco: Never   Substance and Sexual Activity    Alcohol use: Not on file    Drug use: Not on file    Sexual activity: Not on file     Outpatient Medications Marked as Taking for the 12/14/21 encounter [facility] Encounter)   Medication Sig Dispense Refill    ondansetron (ZOFRAN) 4 mg PO ODT. Take 1 Tab by Mouth Every 8 Hours. 10 Tab 0    oxyCODONE-acetaminophen (PERCOCET) 5-325 mg PO TABS Take 1 Tab by Mouth Every 6 Hours As Needed. 6 Tab 0     Allergies   Allergen Reactions    Droperidol neurological reaction     Pt states "face twitching and couldn't stop legs from moving"     Vital Signs:  Patient Vitals for the past 72 hrs:   Temp Heart Rate Resp BP BP Mean SpO2 Weight   12/14/21 1833 97.5 F (36.4 C) 100 18 142/106 (!) 118 MM HG 100 % 96.6 kg (213 lb)     Diagnostics:  Labs:    Results for orders placed or performed during the hospital encounter of 12/14/21   COMPREHENSIVE METABOLIC PANEL   Result Value Ref Range    Potassium 3.6 3.5 - 5.5 mmol/L    Sodium 142 133 - 145 mmol/L    Chloride 106 98 - 110 mmol/L    Glucose 96 70 - 99 mg/dL    Calcium 8.8 8.4 - 10.5 mg/dL    Albumin 4.3 3.5 - 5.0 g/dL    SGPT (ALT) 7 5 - 40 U/L    SGOT (AST) 14 10 - 37 U/L    Bilirubin Total 0.2 0.2 - 1.2 mg/dL    Alkaline Phosphatase 58 25 - 115 U/L    BUN 7 6 - 22 mg/dL    CO2 23 20 - 32 mmol/L    Creatinine 0.5 0.5 - 1.2 mg/dL    eGFR >60.0 >60.0 mL/min/1.73 sq.m.  Globulin 3.0 2.0 - 4.0 g/dL    A/G Ratio 1.4 1.1 - 2.6 ratio    Total Protein 7.3 6.4 - 8.3 g/dL    Anion Gap 13.0 3.0 - 15.0 mmol/L   LIPASE   Result Value Ref Range    Lipase 51 7 - 60 U/L   MAGNESIUM SERUM   Result Value Ref Range    Magnesium 1.8 1.6 - 2.5 mg/dL   ALCOHOL ETHYL   Result Value Ref Range    ETHANOL SERUM 0.04 (H) <=0.02 g/dL   CBC WITH DIFFERENTIAL AUTO   Result Value Ref Range    WBC 7.0 4.0 - 11.0 K/uL    RBC 3.72 (L) 3.80 -  5.20 M/uL    HGB 9.5 (L) 11.7 - 15.5 g/dL    HCT 27.3 (L) 35.1 - 46.5 %    MCV 73 (L) 80 - 99 fL    MCH 26 26 - 34 pg    MCHC 35 31 - 36 g/dL    RDW 18.7 (H) 10.0 - 15.5 %    Platelet 201 140 - 440 K/uL    MPV 10.0 9.0 - 13.0 fL    Segmented Neutrophils (Auto) 57 40 - 75 %    Lymphocytes (Auto) 33 20 - 45 %    Monocytes (Auto) 6 3 - 12 %    Eosinophils (Auto) 3 0 - 6 %    Basophils (Auto) 0 0 - 2 %    Absolute Neutrophils (Auto) 4.0 1.8 - 7.7 K/uL    Absolute Lymphocytes (Auto) 2.3 1.0 - 4.8 K/uL    Absolute Monocytes (Auto) 0.4 0.1 - 1.0 K/uL    Absolute Eosinophils (Auto) 0.2 0.0 - 0.5 K/uL    Absolute Basophils (Auto) 0.0 0.0 - 0.2 K/uL     ECG:  No results found for this visit on 12/14/21.    Medications ordered/given in the ED  Medications   HYDROmorphone (Dilaudid) injection 0.5 mg (has no administration in time range)   sodium chloride (normal saline) 0.9% infusion (1,000 mL Intravenous New Bag 12/14/21 1911)   ondansetron (PF) (Zofran) injection 4 mg (4 mg IV Push Given 12/14/21 1911)   HYDROmorphone (Dilaudid) injection 1 mg (1 mg IV Push Given 12/14/21 1911)       Electronically signed by Loreen Freud, MD at 12/14/2021  8:52 PM EST

## 2021-12-14 NOTE — ED Triage Notes (Signed)
Formatting of this note might be different from the original.  Patient here with 3 days of abdominal pain, nausea and vomiting.  Electronically signed by Ladona Ridgel, RN at 12/14/2021  6:33 PM EST

## 2021-12-14 NOTE — ED Notes (Signed)
Formatting of this note might be different from the original.  Urine sent to lab, no current orders for it.  Electronically signed by Seward Speck, LPN at 54/09/8117  1:47 PM EST

## 2021-12-15 ENCOUNTER — Emergency Department: Admit: 2021-12-16 | Payer: PRIVATE HEALTH INSURANCE | Primary: Family Medicine

## 2021-12-15 ENCOUNTER — Observation Stay
Admission: EM | Admit: 2021-12-15 | Discharge: 2021-12-19 | Disposition: A | Discharge status: Home / Self Care | Payer: PRIVATE HEALTH INSURANCE | Admitting: Internal Medicine

## 2021-12-15 DIAGNOSIS — K859 Acute pancreatitis without necrosis or infection, unspecified: Secondary | ICD-10-CM

## 2021-12-15 DIAGNOSIS — K852 Alcohol induced acute pancreatitis without necrosis or infection: Principal | ICD-10-CM

## 2021-12-15 NOTE — ED Notes (Signed)
Formatting of this note might be different from the original.  Patient is sitting comfortably on stretcher. Respirations are even and unlabored, bed locked and lowered for safety.  Electronically signed by Roberts Gaudy, RN at 12/15/2021  2:00 PM EST

## 2021-12-15 NOTE — ED Provider Notes (Signed)
[facility]  Emergency Department Treatment Report        Patient: Rhonda Hammond Age: 31 y.o. Sex:  female    Date of Birth: 20-Aug-1990 Admit Date: 12/15/2021 PCP: Danny Lawless, MD   MRN: [id number]  CSN: [id number]  APP: Fara Chute, PA-C    Room: YQ65/HQ46 Time Dictated: 11:19 PM Attending No att. providers found    COSIGNER REQUIRED    Chief Complaint   Chief Complaint   Patient presents with    Pancreatitis       History of Present Illness   This is a 31 y.o. female who presents stating that she has a history of pancreatitis she started with abdominal pain yesterday upper abdomen rating to her back aching sharp pain nausea vomiting.  She states it feels just like she has had pancreatitis before she states has been told that she had pancreatitis due to alcohol and fatty foods her last alcohol intake was 3 days ago.  She does not have a gallbladder.  She was seen earlier at another facility is not clear exactly what transpired but the pain got worse was what prompted her to come in now.  No fever chills chest pain or shortness of breath last bowel movement was a couple days ago denies any blood in her stools no blood in her emesis.  No fever or chills chest pain or shortness of breath.    INDEPENDENT HISTORIAN:  none    Review of Systems   Review of Systems   Constitutional:  Negative for chills and fever.   Respiratory:  Negative for cough and shortness of breath.    Cardiovascular:  Negative for chest pain.   Gastrointestinal:  Positive for abdominal pain, nausea and vomiting. Negative for blood in stool.        Epigastric abdominal pain radiating to her back positive for nausea vomiting no diarrhea   Genitourinary:  Negative for dysuria.   Musculoskeletal:  Negative for back pain and neck pain.   Skin:  Negative for rash.   All other systems reviewed and are negative.        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     Intractable nausea and vomiting 06/26/2020    Obesity 12/16/2018    Pancreatitis      Past Surgical History:   Procedure Laterality Date    CESAREAN SECTION      CHOLECYSTECTOMY      UPPER GASTROINTESTINAL ENDOSCOPY N/A 10/12/2021    EGD DIAGNOSTIC performed by Elberta Spaniel, MD at [facility] ENDOSCOPY       Social History         Social History     Socioeconomic History    Marital status: Single     Spouse name: Not on file    Number of children: Not on file    Years of education: Not on file    Highest education level: Not on file   Occupational History    Not on file   Tobacco Use    Smoking status: Every Day     Types: Cigars    Smokeless tobacco: Not on file   Substance and Sexual Activity    Alcohol use: Yes     Alcohol/week: 21.0  standard drinks of alcohol     Types: 21 Shots of liquor per week     Comment: 2-3 drinks a day, sometimes more    Drug use: Yes     Types: Marijuana Sheran Fava)    Sexual activity: Not on file   Other Topics Concern    Not on file   Social History Narrative    Not on file     Social Determinants of Health     Financial Resource Strain: Not on file   Food Insecurity: Not on file   Transportation Needs: Not on file   Physical Activity: Not on file   Stress: Not on file   Social Connections: Not on file   Intimate Partner Violence: Not on file   Housing Stability: Not on file           Family History     Family History   Problem Relation Age of Onset    No Known Problems Mother          Current Medications   Denies taking medications on a regular basis  Current Facility-Administered Medications   Medication Dose Route Frequency Provider Last Rate Last Admin    pantoprazole (PROTONIX) 40 mg in sodium chloride (PF) 0.9 % 10 mL injection  40 mg IntraVENous Daily Mikal Plane C, PA-C   40 mg at 12/15/21 2040    acetaminophen (OFIRMEV) infusion 1,000 mg  1,000 mg  IntraVENous Once Fara Chute, PA-C         Current Outpatient Medications   Medication Sig Dispense Refill    pantoprazole (PROTONIX) 40 MG tablet Take 1 tablet by mouth 2 times daily (before meals) 60 tablet 1    UNABLE TO FIND To whom it may concern:    Ms Darice Vicario was admitted to [facility] 10/05/2021 until 10/12/2021. She may return to work on 10/14/2021 without restrictions. 1 each 0    famotidine (PEPCID) 20 MG tablet Take 1 tablet by mouth 2 times daily      promethazine (PHENERGAN) 25 MG tablet Take 1 tablet by mouth every 6 hours as needed for Nausea      ondansetron (ZOFRAN) 4 MG tablet Take 1 tablet by mouth every 8 hours as needed for Nausea or Vomiting 20 tablet 0    albuterol sulfate HFA (PROVENTIL;VENTOLIN;PROAIR) 108 (90 Base) MCG/ACT inhaler Inhale 2 puffs into the lungs every 6 hours as needed for Wheezing 18 g 3    dicyclomine (BENTYL) 20 MG tablet Take 1 tablet by mouth 4 times daily as needed (abdominal pain) 20 tablet 0    ondansetron (ZOFRAN) 4 MG tablet Take 1 tablet by mouth every 8 hours as needed for Nausea or Vomiting 12 tablet 0    haloperidol (HALDOL) 5 MG tablet Take 1 tablet by mouth 4 times daily as needed (abdominal pain) 30 tablet 0    albuterol (ACCUNEB) 0.63 MG/3ML nebulizer solution Inhale 0.63 mg into the lungs every 6 hours as needed         Allergies     Allergies   Allergen Reactions    Droperidol Other (See Comments)     Face twitching, drooling and spontaneous leg movement       Physical Exam   Patient Vitals for the past 24 hrs:   Temp Pulse Resp BP SpO2   12/15/21 2300 97.7 F (36.5 C) 53 22 (!) 137/91 100 %   12/15/21 1945 97.5 F (36.4 C) 88 24 (!) 163/126  100 %   12/15/21 1826 97.2 F (36.2 C) 80 24 -- 100 %     Physical Exam  Vitals and nursing note reviewed.   Constitutional:       Comments: Is uncomfortable pacing around the exam room   Eyes:      General: No scleral icterus.     Pupils: Pupils are equal, round, and reactive to  light.   Cardiovascular:      Rate and Rhythm: Normal rate and regular rhythm.   Pulmonary:      Breath sounds: Normal breath sounds.   Abdominal:      Comments: Bowel sounds present soft she is tender in her epigastrium and left upper quadrant organomegaly no masses no lower abdominal tenderness no pulsatile masses no right upper quadrant tenderness   Musculoskeletal:      Cervical back: Neck supple. No tenderness.      Comments: Extremities Warm dry well-perfused nontender   Skin:     General: Skin is warm and dry.      Capillary Refill: Capillary refill takes less than 2 seconds.   Neurological:      General: No focal deficit present.      Mental Status: She is alert.          Impression and Management Plan   31 year old female presents for evaluation nausea vomiting epigastric Donnell pain states it feels similar to when she had pancreatitis.  She appears uncomfortable we will start an IV will hydrate with lactated Ringer's dose of Dilaudid Zofran will be given as well as Protonix we will get some baseline labs including a CBC CMP lipase urine will be obtained and a CT abdomen pelvis with IV contrast will be obtained to look for acute infectious or surgical pathology.   Old Records will be reviewed.      Differential diagnoses: Pancreatitis gastritis/duodenitis doubt SBO, perforation acute kidney injury dehydration    Personal Equipment worn: N95 mask   Diagnostic Studies   Lab:   Results for orders placed or performed during the hospital encounter of 12/15/21   CBC with Diff   Result Value Ref Range    WBC 9.6 4.0 - 11.0 1000/mm3    RBC 3.48 (L) 3.60 - 5.20 M/uL    Hemoglobin 8.9 (L) 11.0 - 16.0 gm/dl    Hematocrit 16.1 (L) 35.0 - 47.0 %    MCV 73.0 (L) 80.0 - 98.0 fL    MCH 25.6 25.4 - 34.6 pg    MCHC 35.0 30.0 - 36.0 gm/dl    Platelets 096 045 - 450 1000/mm3    MPV 10.6 (H) 6.0 - 10.0 fL    RDW 49.2 (H) 36.4 - 46.3      Nucleated RBCs 0 0 - 0      Immature Granulocytes 0.2 0.0 - 3.0 %    Neutrophils Segmented  87.8 (H) 34 - 64 %    Lymphocytes 7.3 (L) 28 - 48 %    Monocytes 4.4 1 - 13 %    Eosinophils 0.1 0 - 5 %    Basophils 0.2 0 - 3 %   CMP   Result Value Ref Range    Potassium 3.4 (L) 3.5 - 5.1 mEq/L    Chloride 109 (H) 98 - 107 mEq/L    Sodium 140 136 - 145 mEq/L    CO2 23 20 - 31 mEq/L    Glucose 134 (H) 74 - 106 mg/dl    BUN 9 9 - 23  mg/dl    Creatinine 0.67 0.55 - 1.02 mg/dl    GFR African American >60.0      GFR Non-African American >60      Calcium 9.2 8.7 - 10.4 mg/dl    Anion Gap 8 5 - 15 mmol/L    AST 15.0 0.0 - 33.9 U/L    ALT <7 (L) 10 - 49 U/L    Alkaline Phosphatase 55 46 - 116 U/L    Total Bilirubin 0.60 0.30 - 1.20 mg/dl    Total Protein 7.7 5.7 - 8.2 gm/dl    Albumin 4.2 3.4 - 5.0 gm/dl   Lipase   Result Value Ref Range    Lipase 684 (H) 12 - 53 U/L   ETOH   Result Value Ref Range    Ethanol Lvl NEGATIVE 0.0 - 3.0 mg/dl   POCT Urinalysis no Micro   Result Value Ref Range    Glucose, Ur Negative NEGATIVE,Negative mg/dl    Bilirubin, Urine Small (A) NEGATIVE,Negative      Ketones, Urine 80 (A) NEGATIVE,Negative mg/dl    Specific Gravity, Urine >=1.030 1.005 - 1.030      Blood, Urine Moderate (A) NEGATIVE,Negative      pH, Urine 6.0 5 - 9      Protein, Urine >=300 (A) NEGATIVE,Negative mg/dl    Urobilinogen, Urine 0.2 0.0 - 1.0 EU/dl    Nitrite, Urine Negative NEGATIVE,Negative      Leukocyte Esterase, Urine Negative NEGATIVE,Negative      Color, UA Dark yellow      Clarity, UA Clear     POC Pregnancy Urine Qual   Result Value Ref Range    Pregnancy, Urine negative NEGATIVE,Negative,negative       Imaging:    CT ABDOMEN PELVIS W IV CONTRAST Additional Contrast? None    Result Date: 12/15/2021  EXAM: CT ABDOMEN PELVIS W IV CONTRAST INDICATION: Epigastric pain and vomiting history of pancreatitis with necrosis TECHNIQUE: Axial CT scan of the abdomen and pelvis with   IV contrast including coronal and sagittal reconstructions.  All CT exams at this facility use one or more dose reduction techniques including  automatic exposure control, mA/kV adjustment per patient's size, or iterative reconstruction technique. COMPARISON: 04/23/2021 FINDINGS: Lower thorax: No acute findings ABDOMEN: Liver: Unremarkable. No mass. Gallbladder and bile ducts: Cholecystectomy Pancreas: Enlargement of the known pseudocyst of the head of the pancreas. There is peripancreatic fluid and stranding. Spleen: Unremarkable. No splenomegaly. Adrenals: Unremarkable. No mass. Kidneys and ureters: Unremarkable. No hydronephrosis. No solid mass. Appendix: No findings to suggest appendicitis. Stomach and bowel: Stomach unremarkable. No obstruction. No mucosal thickening. No inflammatory changes. Peritoneum: Unremarkable. No significant fluid collection. No free air. Lymph nodes: Unremarkable. No enlarged lymph nodes. Vasculature: Unremarkable. No aortic aneurysm. Bones: No acute fracture. Abdominal wall: Unremarkable. No evidence of a hernia. PELVIS: Bladder: Unremarkable. No mass. Reproductive: Unchanged at 2.6 cm left ovarian cyst.     IMPRESSION: 1. Definite acute pancreatitis. 2. Enlargement of the pseudocyst at the head of the pancreas measuring 4.8 x 3.6 cm. 3. Unchanged left ovarian cyst appearing 2.6 cm. Electronically signed by: Lenise Arena, MD 12/15/2021 9:41 PM EST          Workstation ID: [id number]      EKG:     Cardiac Monitor Interpretation:     Imaging:     Procedure   Procedures    ED Course         RECORDS REVIEWED:  I reviewed the patient[facility]  [facility] and available outside facilities and note that [facility] morning for pancreatitis and abdominal pain [facility] pain alcohol abuse.    EXTERNAL RESULTS REVIEWED: Lab work from [facility] at [id number] 12/15/2021 showed a normal BMP other than the glucose of 100Lipase was 134Alcohol was 0.04 normal hepatic function.  CBC showed an H&H 9.9 and 28.7    Severe exacerbation or progression of chronic  illness: Likely acute exacerbation underlying pancreatitis    Threat to body function: GI    SOCIAL DETERMINANTS  impacting Evaluation and Management:     Comorbidities impacting Evaluation and Management:     Critical Care Time (if necessary)     Medications   pantoprazole (PROTONIX) 40 mg in sodium chloride (PF) 0.9 % 10 mL injection (40 mg IntraVENous Given 12/15/21 2040)   acetaminophen (OFIRMEV) infusion 1,000 mg (has no administration in time range)   lactated ringers bolus bolus 1,000 mL (0 mLs IntraVENous Stopped 12/15/21 2253)   HYDROmorphone (DILAUDID) injection 1 mg (1 mg IntraVENous Given 12/15/21 2040)   ondansetron (ZOFRAN) injection 4 mg (4 mg IntraVENous Given 12/15/21 2040)   iopamidol (ISOVUE-300) 61 % injection 80 mL (80 mLs IntraVENous Given 12/15/21 2127)   HYDROmorphone (DILAUDID) injection 1 mg (1 mg IntraVENous Given 12/15/21 2202)   diphenhydrAMINE (BENADRYL) injection 25 mg (25 mg IntraVENous Given 12/15/21 2252)       NARRATIVE:      Medical Decision Making     Findings reviewed with patient.  CBC returned with normal white count of 9.6 H&H of 8.9 and 25.4 showing anemia.  Her lab work from earlier this morning was 9.9 and 28.7.  BMP returned showed a potassium 3.4 BUN/creatinine were normal LFTs were normal.  Lipase was elevated at 684.  It was in the 100 range this morning.  Alcohol was negative urine showed ketones no signs of infection    CT abdomen and pelvis returned showing acute pancreatitis pseudocysts with pancreatic fluid and stranding.  Also with an incidental finding of a left ovarian cyst unchanged.    At this time patient has been medicated and required the medication with Dilaudid.  She had some itching and was given some Benadryl.  This is her third visit in 24 hours she has creasing lipase and given that with her CT scan findings I think she needs to come into the hospital for treatment of acute on chronic pancreatitis.  She is agreeable at this time.  I have [facility] call out  to [facility] hospitalist.  I did mention the ovarian cyst and the need for follow-up of that.    Dr. Delton SeeNelson spoke with Dr. Langston MaskerMorris with [facility] who kindly accepts patient for admission.    Final Diagnosis       ICD-10-CM    1. Acute pancreatitis, unspecified complication status, unspecified pancreatitis type  K85.90       2. Nausea and vomiting, unspecified vomiting type  R11.2       3. Dehydration  E86.0       4. Anemia, unspecified type  D64.9       5. Cyst of left ovary  N83.202       6. 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  Patient's chief complaint history physical treatment course and follow-up were discussed with Dr. Hildred Priestim Nelson who  agrees with above assessment and plan.    Fara Chute, PA-C   December 15, 2021    My signature above authenticates this document and my orders, the final    diagnosis (es), discharge prescription (s), and instructions in the Epic    record.  If you have any questions please contact [phone].     Nursing notes have been reviewed by the physician/ advanced practice    Clinician.                             Fara Chute, [place]  12/15/21 2349

## 2021-12-15 NOTE — ED Notes (Signed)
Sent blood work     Benedetto Coons  12/15/21 2144

## 2021-12-15 NOTE — ED Notes (Signed)
Pt is a ton of pain and unable to keep still. Nurse aware of blood pressure      Benedetto Coons  12/15/21 2002

## 2021-12-15 NOTE — ED Notes (Signed)
Formatting of this note is different from the original.  Assumed care of patient     Patient presented to ED with c/o of abd pain   Patient reports constant abd pain    A&Ox4, even rise and fall of chest, breathing unlabored, speaks in full complete sentences, ambulatory with steady gait    Two side rails up for patient safety.   Past Medical History:   Diagnosis Date    Alcohol-induced acute pancreatitis     no longer drinks EtOH regularly    Class 1 obesity with body mass index (BMI) of 32.0 to 32.9 in adult     Gallstones     per pt report       Electronically signed by Roberts Gaudy, RN at 12/15/2021  1:59 PM EST

## 2021-12-15 NOTE — ED Triage Notes (Signed)
Patient via EMS from home. Upper abdominal pain today, seen at [facility] for pancreatitis today. Received 4 mg zofran and 6mg  morphine en route.

## 2021-12-15 NOTE — ED Notes (Signed)
Formatting of this note might be different from the original.  Patient is sitting comfortably on stretcher. Respirations are even and unlabored, bed locked and lowered for safety.   Electronically signed by Roberts Gaudy, RN at 12/15/2021  2:02 PM EST

## 2021-12-15 NOTE — ED Notes (Signed)
Formatting of this note is different from the original.     12/15/21 3664   Vital Signs   Temp 96.7 F (35.9 C)   Temp Source Tympanic   Heart Rate 93   Resp 19   BP 153/103   BP Mean (!) 120 MM HG   SpO2 98 %   Room Air Yes   Device (Oxygen Therapy) room air   Height 5\' 5"  (1.651 m)   Weight 96.6 kg (213 lb)   BMI (Calculated) 35.44       Electronically signed by Neta Ehlers at 12/15/2021  8:23 AM EST

## 2021-12-15 NOTE — H&P (Signed)
Chart reviewed.  Patient seen and examined.  Complete H&P to follow.

## 2021-12-15 NOTE — ED Notes (Signed)
Formatting of this note might be different from the original.  Patient declined haldol injection  PA made aware  Electronically signed by Roberts Gaudy, RN at 12/15/2021  2:04 PM EST

## 2021-12-15 NOTE — ED Notes (Signed)
Formatting of this note is different from the original.     12/15/21 1319   Vital Signs   Temp 97.4 F (36.3 C)   Temp Source Oral   Heart Rate 60   Resp 18   BP 133/80   BP Mean 98 MM HG   SpO2 97 %   Room Air Yes   Device (Oxygen Therapy) room air   Vital Signs   VSA Score ([employer]) 0       Electronically signed by Starr Lake at 12/15/2021  1:19 PM EST

## 2021-12-15 NOTE — H&P (Signed)
History and Physical    DOS: December 15, 2021    Patient: Rhonda Hammond               Sex: female          DOA: 12/15/2021       Date of Birth:  10/15/1990      Age:  31 y.o.        LOS:  LOS: 0 days       MRN: [id number]       Impression/Plan       31 y.o. female with Hx of alcoholic gastritis, alcohol induced chronic pancreatitis, alcohol abuse, cocaine abuse, and obesity with the following:       1.  Pancreatitis, Acute on Chronic    2.  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  Abdominal pain    4.  Nausea and vomiting    5.  Dehydration   6.  Chronic Anemia    7.  Hypokalemia    8.  Ovarian cyst, Left 2.6 cm    9.  Hx Alcoholic gastritis  10. Hx Alcoholic Pancreatitis  11. Hx Polysubstance Abuse with Cocaine  12. Alcohol Use Disorder       PLAN:     Admit to INPT, Rhonda   O2 as needed.  GI and DVT prophylaxis.  Replace electrolytes as needed.    Monitor for signs of developing infectious process, and add antibiotic therapy as needed.  Alcohol withdrawal precautions.  Symptomatic treatment for nausea and pain control.  Hydrate with IV fluids and monitor to avoid fluid overload.  Consider GI consult in AM.  Continue home medications when possible.  Further recommendations based on test results, response to treatment, and clinical course.    CODE STATUS is full code      Chief Complaint:     Chief Complaint   Patient presents with    Pancreatitis       HPI:     Rhonda Hammond is a 31 y.o. female with Hx of alcoholic gastritis, alcohol induced chronic pancreatitis, alcohol abuse, cocaine abuse, and obesity who presents to the ED with complaints of upper abdominal pain radiating to her back with associated nausea and vomiting over the past 2 days prior to evaluation.  Patient was previously seen in the [facility] hospital system 2 times earlier in the day.  Patient states the pain has again worsened, so she has come in to the ED for further evaluation and treatment.  She feels that her symptoms  are consistent with her pancreatitis episodes in the past.  Patient states her last alcohol drink was 3 days ago.  She denies any fever or chills.  She has not had any SOB or chest pain.  She has not had any diarrhea.  She denies any blood in her emesis or stools.  Her last BM was 2 days ago.  The patient has seen Dr [facility] in the past for her GI care.        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     Intractable nausea and vomiting 06/26/2020    Obesity 12/16/2018    Pancreatitis        Past Surgical History:   Procedure Laterality Date    CESAREAN SECTION  CHOLECYSTECTOMY      UPPER GASTROINTESTINAL ENDOSCOPY N/A 10/12/2021    EGD DIAGNOSTIC performed by Sharman Crate, MD at [facility] ENDOSCOPY       Family History   Problem Relation Age of Onset    No Known Problems Mother        Social History     Socioeconomic History    Marital status: Single   Tobacco Use    Smoking status: Every Day     Types: Cigars   Substance and Sexual Activity    Alcohol use: Yes     Alcohol/week: 21.0 standard drinks of alcohol     Types: 21 Shots of liquor per week     Comment: 2-3 drinks a day, sometimes more    Drug use: Yes     Types: Marijuana Sherrie Mustache)       Prior to Admission medications    Medication Sig Start Date End Date Taking? Authorizing Provider   pantoprazole (PROTONIX) 40 MG tablet Take 1 tablet by mouth 2 times daily (before meals) 10/12/21   Kristen Loader, MD   UNABLE TO FIND To whom it may concern:    Rhonda Hammond was admitted to [facility] 10/05/2021 until 10/12/2021. She may return to work on 10/14/2021 without restrictions. 10/12/21   Kristen Loader, MD   famotidine (PEPCID) 20 MG tablet Take 1 tablet by mouth 2 times daily    [provider]   promethazine (PHENERGAN) 25 MG tablet Take 1 tablet by mouth every 6 hours as  needed for Nausea    [provider]   ondansetron (ZOFRAN) 4 MG tablet Take 1 tablet by mouth every 8 hours as needed for Nausea or Vomiting 09/21/21   Kandace Parkins, APRN - NP   albuterol sulfate HFA (PROVENTIL;VENTOLIN;PROAIR) 108 (90 Base) MCG/ACT inhaler Inhale 2 puffs into the lungs every 6 hours as needed for Wheezing 06/07/21   Jacinto Reap, MD   dicyclomine (BENTYL) 20 MG tablet Take 1 tablet by mouth 4 times daily as needed (abdominal pain) 06/01/21   Darleene Cleaver, APRN - NP   ondansetron (ZOFRAN) 4 MG tablet Take 1 tablet by mouth every 8 hours as needed for Nausea or Vomiting 06/01/21   Darleene Cleaver, APRN - NP   haloperidol (HALDOL) 5 MG tablet Take 1 tablet by mouth 4 times daily as needed (abdominal pain) 05/09/21   Susa Raring, MD   albuterol (ACCUNEB) 0.63 MG/3ML nebulizer solution Inhale 0.63 mg into the lungs every 6 hours as needed    Automatic Reconciliation, Ar       Allergies   Allergen Reactions    Droperidol Other (See Comments)     Face twitching, drooling and spontaneous leg movement       Review of Systems:    1) See above. 2) A 12 point review of systems has been obtained. ROS is otherwise noncontributory.      Physical Exam:      Visit Vitals  BP (!) 137/91   Pulse 53   Temp 97.7 F (36.5 C) (Oral)   Resp 22   Ht 1.651 m (5\' 5" )   Wt 96.6 kg (213 lb)   SpO2 100%   BMI 35.45 kg/m       No intake or output data in the 24 hours ending 12/15/21 2314    Physical Exam:    HEENT: NC/AT, PERRLA, EOMI, dry mucous membranes  Neck: No JVD no  bruits, supple, nontender, no significant LAD  LYMPH: No supraclavicular or cervical or axillary nodes on both sides  Cardiovascular: Heart, RRR, no M, R, G  Lungs: Clear to auscultation, coarse upper airway sounds, no wheezes or crackles  Abd: Soft, upper abdominal discomfort to palpation, not distended, No guarding, No rigidity, BS normal  NEURO:  Non focal, normal strength. CN II - XII intact bilaterally   Extrm: no leg edema    Skin: No rashes or lesions     Labs Reviewed:     Recent Results (from the past 24 hour(s))   CBC with Diff    Collection Time: 12/15/21  7:45 PM   Result Value Ref Range    WBC 9.6 4.0 - 11.0 1000/mm3    RBC 3.48 (L) 3.60 - 5.20 M/uL    Hemoglobin 8.9 (L) 11.0 - 16.0 gm/dl    Hematocrit 16.125.4 (L) 35.0 - 47.0 %    MCV 73.0 (L) 80.0 - 98.0 fL    MCH 25.6 25.4 - 34.6 pg    MCHC 35.0 30.0 - 36.0 gm/dl    Platelets 096199 045140 - 450 1000/mm3    MPV 10.6 (H) 6.0 - 10.0 fL    RDW 49.2 (H) 36.4 - 46.3      Nucleated RBCs 0 0 - 0      Immature Granulocytes 0.2 0.0 - 3.0 %    Neutrophils Segmented 87.8 (H) 34 - 64 %    Lymphocytes 7.3 (L) 28 - 48 %    Monocytes 4.4 1 - 13 %    Eosinophils 0.1 0 - 5 %    Basophils 0.2 0 - 3 %   CMP    Collection Time: 12/15/21  7:45 PM   Result Value Ref Range    Potassium 3.4 (L) 3.5 - 5.1 mEq/L    Chloride 109 (H) 98 - 107 mEq/L    Sodium 140 136 - 145 mEq/L    CO2 23 20 - 31 mEq/L    Glucose 134 (H) 74 - 106 mg/dl    BUN 9 9 - 23 mg/dl    Creatinine 4.090.67 8.110.55 - 1.02 mg/dl    GFR African American >60.0      GFR Non-African American >60      Calcium 9.2 8.7 - 10.4 mg/dl    Anion Gap 8 5 - 15 mmol/L    AST 15.0 0.0 - 33.9 U/L    ALT <7 (L) 10 - 49 U/L    Alkaline Phosphatase 55 46 - 116 U/L    Total Bilirubin 0.60 0.30 - 1.20 mg/dl    Total Protein 7.7 5.7 - 8.2 gm/dl    Albumin 4.2 3.4 - 5.0 gm/dl   Lipase    Collection Time: 12/15/21  7:45 PM   Result Value Ref Range    Lipase 684 (H) 12 - 53 U/L   POCT Urinalysis no Micro    Collection Time: 12/15/21  8:22 PM   Result Value Ref Range    Glucose, Ur Negative NEGATIVE,Negative mg/dl    Bilirubin, Urine Small (A) NEGATIVE,Negative      Ketones, Urine 80 (A) NEGATIVE,Negative mg/dl    Specific Gravity, Urine >=1.030 1.005 - 1.030      Blood, Urine Moderate (A) NEGATIVE,Negative      pH, Urine 6.0 5 - 9      Protein, Urine >=300 (A) NEGATIVE,Negative mg/dl    Urobilinogen, Urine 0.2 0.0 - 1.0 EU/dl    Nitrite, Urine Negative NEGATIVE,Negative  Leukocyte Esterase, Urine Negative NEGATIVE,Negative      Color, UA Dark yellow      Clarity, UA Clear     POC Pregnancy Urine Qual    Collection Time: 12/15/21  8:23 PM   Result Value Ref Range    Pregnancy, Urine negative NEGATIVE,Negative,negative     ETOH    Collection Time: 12/15/21  9:42 PM   Result Value Ref Range    Ethanol Lvl NEGATIVE 0.0 - 3.0 mg/dl           Diagnostic:     CT ABDOMEN PELVIS W IV CONTRAST Additional Contrast? None    Result Date: 12/15/2021  EXAM: CT ABDOMEN PELVIS W IV CONTRAST INDICATION: Epigastric pain and vomiting history of pancreatitis with necrosis TECHNIQUE: Axial CT scan of the abdomen and pelvis with   IV contrast including coronal and sagittal reconstructions.  All CT exams at this facility use one or more dose reduction techniques including automatic exposure control, mA/kV adjustment per patient's size, or iterative reconstruction technique. COMPARISON: 04/23/2021 FINDINGS: Lower thorax: No acute findings ABDOMEN: Liver: Unremarkable. No mass. Gallbladder and bile ducts: Cholecystectomy Pancreas: Enlargement of the known pseudocyst of the head of the pancreas. There is peripancreatic fluid and stranding. Spleen: Unremarkable. No splenomegaly. Adrenals: Unremarkable. No mass. Kidneys and ureters: Unremarkable. No hydronephrosis. No solid mass. Appendix: No findings to suggest appendicitis. Stomach and bowel: Stomach unremarkable. No obstruction. No mucosal thickening. No inflammatory changes. Peritoneum: Unremarkable. No significant fluid collection. No free air. Lymph nodes: Unremarkable. No enlarged lymph nodes. Vasculature: Unremarkable. No aortic aneurysm. Bones: No acute fracture. Abdominal wall: Unremarkable. No evidence of a hernia. PELVIS: Bladder: Unremarkable. No mass. Reproductive: Unchanged at 2.6 cm left ovarian cyst.     IMPRESSION: 1. Definite acute pancreatitis. 2. Enlargement of the pseudocyst at the head of the pancreas measuring 4.8 x 3.6 cm. 3. Unchanged  left ovarian cyst appearing 2.6 cm. Electronically signed by: Cheryln Manly, MD 12/15/2021 9:41 PM EST          Workstation ID: [id number]      Dragon medical dictation software was used for portions of this report.  Efforts have been made to edit the dictations, but occasionally words are mis-transcribed.    Time spent: 78 minutes    Annamarie Dawley, MD  12/15/2021  11:14 PM

## 2021-12-15 NOTE — ED Notes (Signed)
Sent urine to lab      Rhonda Hammond  12/15/21 2019

## 2021-12-15 NOTE — ED Notes (Signed)
Formatting of this note might be different from the original.  Pain assessment on discharge was 0.  Condition stable.  Patient discharged to home.  Patient education was completed:  yes  Education taught to:  patient  Teaching method used was discussion.  Understanding of teaching was good.  Patient was discharged ambulatory.  Discharged with self.  Valuables were given to: patient.  Patient medicated per mar   Electronically signed by Roberts Gaudy, RN at 12/15/2021  2:09 PM EST

## 2021-12-15 NOTE — ED Provider Notes (Signed)
Formatting of this note is different from the original.  ED Resident Supervision Note    I have seen and evaluated Rhonda Hammond and agree with the provider?s findings (exceptions, if any, noted below).  I reviewed and directed the Emergency Department treatment given to Rhonda Hammond     A/P:  PT w h/o chronic pancreatitis p/w pain similar to prior pancreatitis. Improved in the ED, discharged w recommendations for pcp and gi followup    Critical Care Time:  none    Procedures:  none    S:  31 y.o. female  with a chief complaint of ABDOMINAL PAIN and VOMITING    O:  Patient Vitals for the past 72 hrs:   Temp Heart Rate Resp BP BP Mean SpO2 Weight   12/15/21 0935 97.2 F (36.2 C) 71 18 157/88 (!) 111 MM HG 100 % --   12/15/21 0821 96.7 F (35.9 C) 93 19 153/103 (!) 120 MM HG 98 % 96.6 kg (213 lb)         Electronically signed by Marla Roe, MD at 12/22/2021 10:29 PM EST

## 2021-12-15 NOTE — ED Notes (Signed)
Started an IV and sent blood work  Informed pt that we need a urine sample and that she is NPO      Rhonda Hammond  12/15/21 1948

## 2021-12-15 NOTE — ED Provider Notes (Signed)
Formatting of this note is different from the original.    [place]    Time of Arrival:   12/15/21 1829    Final diagnoses:   [Z87.19] History of acute pancreatitis (Primary)   [R10.13] Abdominal pain, epigastric   [R11.2] Nausea and vomiting, unspecified vomiting type     Medical Decision Making:      Differential Diagnosis:   Pancreatitis, abdominal pain    Social Determinants of Health:  no primary care provider                               Supplemental Historians include:  patient    ED Course:   Initial vital signs afebrile nontachycardic blood pressure 157/88 sats 100%  PMH gallstones with cholecystectomy, alcohol induced pancreatitis  Patient seen here 2 weeks ago diagnosed with pancreatitis related to alcohol use Labs were overall reassuring patient refused CT she wanted pain medication she was treated discharged home  Patient seen here yesterday Labs were reassuring symptoms were treated CT was not done because signs and symptoms are consistent with prior pancreatitis episodes she was discharged home  Patient states that she felt improved last night this morning she woke up a few hours prior to arrival with epigastric pain nausea and vomiting she states this is consistent with prior episodes of pancreatitis states no alcohol use yesterday or today, she denies fever chills chest pain shortness of breath diarrhea flank pain urinary symptoms  Used shared decision making patient states this is consistent with prior episodes of pancreatitis, will screen labs treat symptoms and reassess will not obtain CT at this time  Previous CT 11/25/21 2.7 cm pancreatic pseudocyst stable since scan 7/23    Heart RRR, Lungs CTAB  Abd soft and non-distended, normal BS, epigastric TTP   No CVAT     Initial dose of pain medication infiltrated per RN, repeat ordered for new IV placed, initial labs hemolyzed pending recollect     CBC no leukocytosis, hgb stable   BMP LFTs reassuring  EtOH  WNL  Serum pregnancy negative  Lipase elevated at 134  UA negative bacteria only trace leuks no UTI symptoms will send culture     Patient reports symptoms are improving she would like another dose of pain medication  She has been tolerating p.o. during her stay she is well-appearing  Discussed that she really can receive a dose of Haldol and Benadryl she is on board for this discussed that she will need to be following up with her PCP and will not write narcotics at discharge for chronic pancreatitis  Previous droperidol reaction twitching so will give benadryl with haldol pt has had numerous times before  Pt on board with this plan     2:10 PM informed by nurse that patient no longer wants to take Haldol she states that she would like something for pain, attempted to talk with patient she was in bathroom attempted to speak with patient again and she was found outside with her IV in stating she was making a phone call.  Patient states that she would be comfortable leaving if she receives a dose of pain medication discussed with her that she can receive 1 dose of Norco otherwise she will need to follow-up with her PCP for further pain management of her chronic abdominal pain related pancreatitis discussed [facility] narcotics policies with pt at length and she verbalized understanding  Patient is  agreeable with last dose of pain medication prior to discharge    Patient is comfortable with discharge and outpatient management of their symptoms at this time. All questions answered.  I have reviewed the laboratory and imaging studies with the patient.  I advised them to follow-up with their primary care provider and review all findings, including any nonspecific abnormalities.     Discharge: With the patient's presentation, I considered ED observation or hospital admission.  Given their exam findings and work-up, they are appropriate and stable for discharge.     As of 12/15/2021, 10:21 AM  The differential diagnosis and /  or critical care lists were considered including infections, sepsis, severe sepsis, and septic shock and found unlikely unless otherwise documented in the final clinical impression or diagnosis list.     Documentation/Prior Results Review:  Initial ED Provider Note , Old medical records, Previous electrocardiograms, Nursing notes, Ambulance run sheet, Previous radiology studies, Nursing Home record    Rhythm interpretation from monitor: N/A    Imaging Interpreted by me: Not Applicable    No orders to display     .     Discussion of Management with other Physicians, QHP or Appropriate Source:   None    .    Disposition:  Home    New Prescriptions    No medications on file     Chief Complaint   Patient presents with    ABDOMINAL PAIN    VOMITING     Location: epgiastric   Radiation: none   Quality: stab   Severity: 10  Onset: this morning at 6 am   Timing: constant   +n/V        Review of Systems:  Constitutional:  Negative for fever and chills.   Respiratory:  Negative for shortness of breath.    Cardiovascular:  Negative for chest pain.   Gastrointestinal:  Positive for nausea, vomiting and abdominal pain. Negative for diarrhea.   Genitourinary:  Negative for dysuria, urgency, frequency, hematuria and flank pain.     Physical Exam  Vitals and nursing note reviewed.   Constitutional:       General: She is not in acute distress.     Appearance: Normal appearance. She is not ill-appearing, toxic-appearing or diaphoretic.   HENT:      Head: Normocephalic and atraumatic.      Right Ear: External ear normal.      Left Ear: External ear normal.   Eyes:      Conjunctiva/sclera: Conjunctivae normal.   Cardiovascular:      Rate and Rhythm: Normal rate and regular rhythm.      Heart sounds: No murmur heard.     No friction rub. No gallop.   Pulmonary:      Effort: Pulmonary effort is normal. No tachypnea, bradypnea or respiratory distress.      Breath sounds: Normal breath sounds and air entry. No decreased breath sounds,  wheezing, rhonchi or rales.   Abdominal:      General: Bowel sounds are normal. There is no distension.      Palpations: Abdomen is soft. There is no mass.      Tenderness: There is abdominal tenderness in the epigastric area. There is no right CVA tenderness, left CVA tenderness, guarding or rebound.      Hernia: No hernia is present.   Skin:     General: Skin is warm.      Capillary Refill: Capillary refill takes less than 2 seconds.  Neurological:      Mental Status: She is alert and oriented to person, place, and time.   Psychiatric:         Behavior: Behavior is cooperative.     Past Medical History:   Diagnosis Date    Alcohol-induced acute pancreatitis     no longer drinks EtOH regularly    Class 1 obesity with body mass index (BMI) of 32.0 to 32.9 in adult     Gallstones     per pt report     Past Surgical History:   Procedure Laterality Date    LAP CHOLECYSTECTOMY N/A 02/10/2021    Procedure: CHOLECYSTECTOMY, LAPAROSCOPIC, WITH CHOLANGIOGRAM;  Surgeon: Wynelle Fanny, MD     Family History   Problem Relation Age of Onset    No Known Problems Mother     Other Family History Father         died from trauma     Social History     Occupational History    Not on file   Tobacco Use    Smoking status: Every Day     Types: Cigarettes    Smokeless tobacco: Never   Substance and Sexual Activity    Alcohol use: Not on file    Drug use: Not on file    Sexual activity: Not on file     No outpatient medications have been marked as taking for the 12/15/21 encounter [facility] Encounter).     Allergies   Allergen Reactions    Droperidol neurological reaction     Pt states "face twitching and couldn't stop legs from moving"     Vital Signs:  Patient Vitals for the past 72 hrs:   Temp Heart Rate Resp BP BP Mean SpO2 Weight   12/15/21 1319 97.4 F (36.3 C) 60 18 133/80 98 MM HG 97 % --   12/15/21 0935 97.2 F (36.2 C) 71 18 157/88 (!) 111 MM HG 100 % --   12/15/21 0821 96.7 F (35.9 C) 93 19 153/103 (!) 120 MM HG 98 % 96.6  kg (213 lb)     Diagnostics:  Labs:    Results for orders placed or performed during the hospital encounter of 56/43/32   BASIC METABOLIC PANEL   Result Value Ref Range    Potassium 3.8 3.5 - 5.5 mmol/L    Sodium 137 133 - 145 mmol/L    Chloride 106 98 - 110 mmol/L    Glucose 100 (H) 70 - 99 mg/dL    Calcium 8.6 8.4 - 10.5 mg/dL    BUN 8 6 - 22 mg/dL    Creatinine 0.4 (L) 0.5 - 1.2 mg/dL    CO2 21 20 - 32 mmol/L    eGFR >60.0 >60.0 mL/min/1.73 sq.m.    Anion Gap 10.0 3.0 - 15.0 mmol/L   HEPATIC FUNCTION PANEL   Result Value Ref Range    Albumin 4.2 3.5 - 5.0 g/dL    Total Protein 7.1 6.4 - 8.3 g/dL    Globulin 2.9 2.0 - 4.0 g/dL    A/G Ratio 1.4 1.1 - 2.6 ratio    Bilirubin Total 0.3 0.2 - 1.2 mg/dL    Bilirubin Direct <0.2 0.0 - 0.3 mg/dL    SGOT (AST) 16 10 - 37 U/L    Alkaline Phosphatase 57 25 - 115 U/L    SGPT (ALT) 6 5 - 40 U/L   LIPASE   Result Value Ref Range    Lipase 134 (H) 7 -  60 U/L   CBC WITH DIFFERENTIAL AUTO   Result Value Ref Range    WBC 6.3 4.0 - 11.0 K/uL    RBC 3.85 3.80 - 5.20 M/uL    HGB 9.9 (L) 11.7 - 15.5 g/dL    HCT 28.7 (L) 35.1 - 46.5 %    MCV 75 (L) 80 - 99 fL    MCH 26 26 - 34 pg    MCHC 35 31 - 36 g/dL    RDW 19.0 (H) 10.0 - 15.5 %    Platelet 218 140 - 440 K/uL    MPV 10.7 9.0 - 13.0 fL    Segmented Neutrophils (Auto) 60 40 - 75 %    Lymphocytes (Auto) 28 20 - 45 %    Monocytes (Auto) 9 3 - 12 %    Eosinophils (Auto) 3 0 - 6 %    Basophils (Auto) 0 0 - 2 %    Absolute Neutrophils (Auto) 3.8 1.8 - 7.7 K/uL    Absolute Lymphocytes (Auto) 1.8 1.0 - 4.8 K/uL    Absolute Monocytes (Auto) 0.6 0.1 - 1.0 K/uL    Absolute Eosinophils (Auto) 0.2 0.0 - 0.5 K/uL    Absolute Basophils (Auto) 0.0 0.0 - 0.2 K/uL   ALCOHOL ETHYL   Result Value Ref Range    ETHANOL SERUM <=0.01 <=0.02 g/dL   Pregnancy, Serum   Result Value Ref Range    PREGNANCY SERUM Negative Negative   Urinalysis w Micro Reflex Culture    Specimen: Clean Catch Urine   Result Value Ref Range    Source Urine      Urine Color Orange  (A) Colorless, Pale Yellow, Light Yellow, Yellow, Dark Yellow, Straw    Urine Clarity Cloudy (A) Clear, Slightly Cloudy    Urine pH 5.5 5.0 - 8.0 pH    Urine Protein Screen 30* (A) Negative, Trace mg/dL    Urine Glucose Negative Negative mg/dL    Urine Ketones Negative Negative mg/dL    Urine Occult Blood Large (A) Negative    Urine Specific Gravity 1.023 1.005 - 1.030    Urine Nitrite Negative Negative    Urine Leukocyte Esterase Trace (A) Negative    Urine Bilirubin Negative Negative    Urine Urobilinogen 1.0 <2.0 mg/dL mg/dL    Urine RBC >100 (A) Negative, 0-2 /hpf    Urine WBC 6-10 (A) 0 - 5 /hpf    Urine Bacteria Negative Negative    Squamous Epithelial Cells 3-5 (A) None, 0-2 /hpf    Hyaline Cast 0-2 0 - 2 /lpf     ECG:  No results found for this visit on 12/15/21.    Medications ordered/given in the ED  Medications   Lactated Ringers (LR) infusion (1,000 mL Intravenous New Bag 12/15/21 0919)   ondansetron (PF) (Zofran) injection 4 mg (4 mg Intravenous Given 12/15/21 0914)   HYDROmorphone (Dilaudid) injection 1 mg (1 mg IV Push Given 12/15/21 0914)   HYDROmorphone (Dilaudid) injection 1 mg (1 mg IV Push Given 12/15/21 0945)   diphenhydrAMINE (BenadryL) capsule 50 mg (50 mg Oral Given 12/15/21 1335)   HYDROcodone-acetaminophen (Norco 5) 5-325 mg 1 Tab (1 Tab Oral Given 12/15/21 1407)       Electronically signed by Marla Roe, MD at 12/22/2021 11:19 PM EST

## 2021-12-15 NOTE — ED Triage Notes (Signed)
Formatting of this note might be different from the original.  Reporting persistent abdominal pain, N&V. Hx pancreatitis. Was seen yesterday for the same. Symptoms have worsened.   Electronically signed by Martie Round, RN at 12/15/2021  8:19 AM EST

## 2021-12-16 LAB — COMPREHENSIVE METABOLIC PANEL W/ REFLEX TO MG FOR LOW K
ALT: 7 U/L — ABNORMAL LOW (ref 10–49)
AST: 14 U/L (ref 0.0–33.9)
Albumin: 4.1 gm/dl (ref 3.4–5.0)
Alkaline Phosphatase: 51 U/L (ref 46–116)
Anion Gap: 7 mmol/L (ref 5–15)
BUN: 7 mg/dl — ABNORMAL LOW (ref 9–23)
CO2: 21 mEq/L (ref 20–31)
Calcium: 9.5 mg/dl (ref 8.7–10.4)
Chloride: 109 mEq/L — ABNORMAL HIGH (ref 98–107)
Creatinine: 0.56 mg/dl (ref 0.55–1.02)
Glucose: 106 mg/dl (ref 74–106)
Potassium: 3.5 mEq/L (ref 3.5–5.1)
Sodium: 137 mEq/L (ref 136–145)
Total Bilirubin: 0.6 mg/dl (ref 0.30–1.20)
Total Protein: 7.5 gm/dl (ref 5.7–8.2)

## 2021-12-16 LAB — URINE DRUG SCREEN
Amphetamine: NEGATIVE
Barbiturates, Urine: NEGATIVE
Benzodiazepines, Urine: NEGATIVE
Cocaine: NEGATIVE
Marijuana: POSITIVE — AB
Methadone Screen, Urine: NEGATIVE
Opiates, Urine: POSITIVE — AB
Phencyclidine: NEGATIVE

## 2021-12-16 LAB — CBC WITH AUTO DIFFERENTIAL
Basophils: 0.2 % (ref 0–3)
Basophils: 0.1 % (ref 0–3)
Eosinophils: 0.1 % (ref 0–5)
Eosinophils: 0.1 % (ref 0–5)
Hematocrit: 25.4 % — ABNORMAL LOW (ref 35.0–47.0)
Hematocrit: 24.4 % — ABNORMAL LOW (ref 35.0–47.0)
Hemoglobin: 8.9 gm/dl — ABNORMAL LOW (ref 11.0–16.0)
Hemoglobin: 8.6 gm/dl — ABNORMAL LOW (ref 11.0–16.0)
Immature Granulocytes: 0.2 % (ref 0.0–3.0)
Immature Granulocytes: 0.4 % (ref 0.0–3.0)
Lymphocytes: 7.3 % — ABNORMAL LOW (ref 28–48)
Lymphocytes: 10.4 % — ABNORMAL LOW (ref 28–48)
MCH: 25.6 pg (ref 25.4–34.6)
MCH: 25.9 pg (ref 25.4–34.6)
MCHC: 35 gm/dl (ref 30.0–36.0)
MCHC: 35.2 gm/dl (ref 30.0–36.0)
MCV: 73 fL — ABNORMAL LOW (ref 80.0–98.0)
MCV: 73.5 fL — ABNORMAL LOW (ref 80.0–98.0)
MPV: 10.6 fL — ABNORMAL HIGH (ref 6.0–10.0)
MPV: 10.7 fL — ABNORMAL HIGH (ref 6.0–10.0)
Monocytes: 4.4 % (ref 1–13)
Monocytes: 7.3 % (ref 1–13)
Neutrophils Segmented: 87.8 % — ABNORMAL HIGH (ref 34–64)
Neutrophils Segmented: 81.7 % — ABNORMAL HIGH (ref 34–64)
Nucleated RBCs: 0 (ref 0–0)
Nucleated RBCs: 0 (ref 0–0)
Platelets: 199 10*3/uL (ref 140–450)
Platelets: 191 10*3/uL (ref 140–450)
RBC: 3.48 M/uL — ABNORMAL LOW (ref 3.60–5.20)
RBC: 3.32 M/uL — ABNORMAL LOW (ref 3.60–5.20)
RDW: 49.2 — ABNORMAL HIGH (ref 36.4–46.3)
RDW: 48.9 — ABNORMAL HIGH (ref 36.4–46.3)
WBC: 9.6 10*3/uL (ref 4.0–11.0)
WBC: 10.2 10*3/uL (ref 4.0–11.0)

## 2021-12-16 LAB — POC PREGNANCY UR-QUAL: Pregnancy, Urine: NEGATIVE

## 2021-12-16 LAB — COMPREHENSIVE METABOLIC PANEL
ALT: <7 U/L — ABNORMAL LOW (ref 10–49)
AST: 15 U/L (ref 0.0–33.9)
Albumin: 4.2 gm/dl (ref 3.4–5.0)
Alkaline Phosphatase: 55 U/L (ref 46–116)
Anion Gap: 8 mmol/L (ref 5–15)
BUN: 9 mg/dl (ref 9–23)
CO2: 23 mEq/L (ref 20–31)
Calcium: 9.2 mg/dl (ref 8.7–10.4)
Chloride: 109 mEq/L — ABNORMAL HIGH (ref 98–107)
Creatinine: 0.67 mg/dl (ref 0.55–1.02)
GFR African American: >60
GFR Non-African American: >60
Glucose: 134 mg/dl — ABNORMAL HIGH (ref 74–106)
Potassium: 3.4 mEq/L — ABNORMAL LOW (ref 3.5–5.1)
Sodium: 140 mEq/L (ref 136–145)
Total Bilirubin: 0.6 mg/dl (ref 0.30–1.20)
Total Protein: 7.7 gm/dl (ref 5.7–8.2)

## 2021-12-16 LAB — POCT URINALYSIS DIPSTICK
Glucose, Ur: NEGATIVE mg/dl
Ketones, Urine: 80 mg/dl — AB
Leukocyte Esterase, Urine: NEGATIVE
Nitrite, Urine: NEGATIVE
Protein, Urine: >=300 mg/dl — AB
Specific Gravity, Urine: >=1.03 (ref 1.005–1.030)
Urobilinogen, Urine: 0.2 EU/dl (ref 0.0–1.0)
pH, Urine: 6 (ref 5–9)

## 2021-12-16 LAB — LIPASE: Lipase: 684 U/L — ABNORMAL HIGH (ref 12–53)

## 2021-12-16 LAB — ETHANOL: Ethanol Lvl: NEGATIVE mg/dl (ref 0.0–3.0)

## 2021-12-16 MED ORDER — HYDROMORPHONE HCL 1 MG/ML IJ SOLN
1 MG/ML | INTRAMUSCULAR | Status: DC | PRN
Start: 2021-12-16 — End: 2021-12-19
  Administered 2021-12-16 – 2021-12-19 (×20): 1 mg via INTRAVENOUS

## 2021-12-16 MED ORDER — ACETAMINOPHEN 325 MG PO TABS
325 MG | Freq: Four times a day (QID) | ORAL | Status: DC | PRN
Start: 2021-12-16 — End: 2021-12-19
  Administered 2021-12-17 – 2021-12-18 (×3): 650 mg via ORAL

## 2021-12-16 MED ORDER — LACTATED RINGERS IV SOLN
INTRAVENOUS | Status: DC
Start: 2021-12-16 — End: 2021-12-19
  Administered 2021-12-16 – 2021-12-19 (×9): via INTRAVENOUS

## 2021-12-16 MED ORDER — HYDROMORPHONE HCL 1 MG/ML IJ SOLN
1 MG/ML | INTRAMUSCULAR | Status: AC
Start: 2021-12-16 — End: 2021-12-15
  Administered 2021-12-16: 02:00:00 1 mg via INTRAVENOUS

## 2021-12-16 MED ORDER — IOPAMIDOL 61 % IV SOLN
61 % | Freq: Once | INTRAVENOUS | Status: AC | PRN
Start: 2021-12-16 — End: 2021-12-15
  Administered 2021-12-16: 02:00:00 80 mL via INTRAVENOUS

## 2021-12-16 MED ORDER — PANTOPRAZOLE SODIUM 40 MG IV SOLR
40 MG | Freq: Every day | INTRAVENOUS | Status: AC
Start: 2021-12-16 — End: 2021-12-19
  Administered 2021-12-16 – 2021-12-19 (×5): 40 mg via INTRAVENOUS

## 2021-12-16 MED ORDER — NORMAL SALINE FLUSH 0.9 % IV SOLN
0.9 % | Freq: Two times a day (BID) | INTRAVENOUS | Status: DC
Start: 2021-12-16 — End: 2021-12-18
  Administered 2021-12-16 – 2021-12-17 (×3): 10 mL via INTRAVENOUS

## 2021-12-16 MED ORDER — SODIUM CHLORIDE 0.9 % IV SOLN
0.9 % | INTRAVENOUS | Status: AC | PRN
Start: 2021-12-16 — End: 2021-12-19

## 2021-12-16 MED ORDER — HYDROMORPHONE HCL 1 MG/ML IJ SOLN
1 MG/ML | Freq: Once | INTRAMUSCULAR | Status: AC
Start: 2021-12-16 — End: 2021-12-15
  Administered 2021-12-16: 03:00:00 1 mg via INTRAVENOUS

## 2021-12-16 MED ORDER — ENOXAPARIN SODIUM 40 MG/0.4ML IJ SOSY
40 MG/0.4ML | Freq: Every day | INTRAMUSCULAR | Status: AC
Start: 2021-12-16 — End: 2021-12-19
  Administered 2021-12-17: 14:00:00 40 mg via SUBCUTANEOUS

## 2021-12-16 MED ORDER — ACETAMINOPHEN 10 MG/ML IV SOLN
10 MG/ML | Freq: Once | INTRAVENOUS | Status: AC
Start: 2021-12-16 — End: 2021-12-16
  Administered 2021-12-16: 06:00:00 1000 mg via INTRAVENOUS

## 2021-12-16 MED ORDER — NORMAL SALINE FLUSH 0.9 % IV SOLN
0.9 % | INTRAVENOUS | Status: AC | PRN
Start: 2021-12-16 — End: 2021-12-19

## 2021-12-16 MED ORDER — ONDANSETRON 4 MG PO TBDP
4 MG | Freq: Three times a day (TID) | ORAL | Status: AC | PRN
Start: 2021-12-16 — End: 2021-12-19
  Administered 2021-12-17: 18:00:00 4 mg via ORAL

## 2021-12-16 MED ORDER — HYDROMORPHONE HCL 1 MG/ML IJ SOLN
1 MG/ML | INTRAMUSCULAR | Status: AC | PRN
Start: 2021-12-16 — End: 2021-12-16
  Administered 2021-12-16 (×2): 0.5 mg via INTRAVENOUS

## 2021-12-16 MED ORDER — MAGNESIUM SULFATE 2000 MG/50 ML IVPB PREMIX
2 GM/50ML | INTRAVENOUS | Status: AC | PRN
Start: 2021-12-16 — End: 2021-12-19

## 2021-12-16 MED ORDER — ONDANSETRON HCL 4 MG/2ML IJ SOLN
4 MG/2ML | Freq: Once | INTRAMUSCULAR | Status: AC
Start: 2021-12-16 — End: 2021-12-15
  Administered 2021-12-16: 02:00:00 4 mg via INTRAVENOUS

## 2021-12-16 MED ORDER — ONDANSETRON HCL 4 MG/2ML IJ SOLN
4 MG/2ML | Freq: Four times a day (QID) | INTRAMUSCULAR | Status: DC | PRN
Start: 2021-12-16 — End: 2021-12-19
  Administered 2021-12-16 (×2): 4 mg via INTRAVENOUS

## 2021-12-16 MED ORDER — POLYETHYLENE GLYCOL 3350 17 G PO PACK
17 g | Freq: Every day | ORAL | Status: AC | PRN
Start: 2021-12-16 — End: 2021-12-19

## 2021-12-16 MED ORDER — ACETAMINOPHEN 10 MG/ML IV SOLN
10 | Freq: Four times a day (QID) | INTRAVENOUS | Status: DC | PRN
Start: 2021-12-16 — End: 2021-12-19
  Administered 2021-12-16: 10:00:00 1000 mg via INTRAVENOUS

## 2021-12-16 MED ORDER — ACETAMINOPHEN 650 MG RE SUPP
650 MG | Freq: Four times a day (QID) | RECTAL | Status: DC | PRN
Start: 2021-12-16 — End: 2021-12-19

## 2021-12-16 MED ORDER — DIPHENHYDRAMINE HCL 50 MG/ML IJ SOLN
50 MG/ML | Freq: Four times a day (QID) | INTRAMUSCULAR | Status: AC | PRN
Start: 2021-12-16 — End: 2021-12-19

## 2021-12-16 MED ORDER — DIPHENHYDRAMINE HCL 50 MG/ML IJ SOLN
50 MG/ML | INTRAMUSCULAR | Status: AC
Start: 2021-12-16 — End: 2021-12-15
  Administered 2021-12-16: 04:00:00 25 mg via INTRAVENOUS

## 2021-12-16 MED ORDER — LACTATED RINGERS IV BOLUS
Freq: Once | INTRAVENOUS | Status: AC
Start: 2021-12-16 — End: 2021-12-15
  Administered 2021-12-16: 02:00:00 1000 mL via INTRAVENOUS

## 2021-12-16 MED FILL — ACETAMINOPHEN 10 MG/ML IV SOLN: 10 MG/ML | INTRAVENOUS | Qty: 100

## 2021-12-16 MED FILL — PANTOPRAZOLE SODIUM 40 MG IV SOLR: 40 MG | INTRAVENOUS | Qty: 40

## 2021-12-16 MED FILL — HYDROMORPHONE HCL 1 MG/ML IJ SOLN: 1 MG/ML | INTRAMUSCULAR | Qty: 1

## 2021-12-16 MED FILL — ENOXAPARIN SODIUM 40 MG/0.4ML IJ SOSY: 40 MG/0.4ML | INTRAMUSCULAR | Qty: 0.4

## 2021-12-16 MED FILL — SODIUM CHLORIDE FLUSH 0.9 % IV SOLN: 0.9 % | INTRAVENOUS | Qty: 10

## 2021-12-16 MED FILL — ONDANSETRON HCL 4 MG/2ML IJ SOLN: 4 MG/2ML | INTRAMUSCULAR | Qty: 2

## 2021-12-16 MED FILL — LACTATED RINGERS IV SOLN: INTRAVENOUS | Qty: 1000

## 2021-12-16 MED FILL — ISOVUE-300 61 % IV SOLN: 61 % | INTRAVENOUS | Qty: 80

## 2021-12-16 MED FILL — DIPHENHYDRAMINE HCL 50 MG/ML IJ SOLN: 50 MG/ML | INTRAMUSCULAR | Qty: 1

## 2021-12-16 NOTE — ED Notes (Signed)
Received report from [facility]. Pt to EO4.     Geralynn Ochs, RN  12/16/21 0002

## 2021-12-16 NOTE — Progress Notes (Signed)
Medicine Progress Note    Patient: Rhonda Hammond   Age:  31 y.o.  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  -PPI  -Counseled on cessation of alcohol contribution to current diagnosis    Subjective:   Patient seen and examined.  Complains of abdominal pain and nausea no vomiting, F/C, CP or SOB      Objective:   Visit Vitals  BP (!) 153/99   Pulse 66   Temp 98.1 F (36.7 C) (Temporal)   Resp 16   Ht 1.651 m (5\' 5" )   Wt 96.6 kg (213 lb)   SpO2 100%   BMI 35.45 kg/m       Physical Exam:  General appearance: alert, cooperative, no distress, appears stated age  Head: Normocephalic, without obvious abnormality, atraumatic  Neck: supple, trachea midline  Lungs: clear to auscultation bilaterally  Heart: regular rate and rhythm, S1, S2 normal, no murmur, click, rub or gallop  Abdomen: soft, tender throughout worse in the epigastrium without rebound   extremities: extremities normal, atraumatic, no cyanosis or edema  Skin: Skin color, texture, turgor normal. No rashes or lesions in exposed areas       Medications Reviewed:  Current Facility-Administered Medications   Medication Dose Route Frequency    sodium chloride flush 0.9 % injection 5-40 mL  5-40 mL IntraVENous 2 times per day    sodium chloride flush 0.9 % injection 5-40 mL  5-40 mL IntraVENous PRN    0.9 % sodium chloride infusion   IntraVENous PRN    magnesium sulfate 2000 mg in 50 mL IVPB premix  2,000 mg IntraVENous PRN    enoxaparin (LOVENOX) injection 40 mg  40 mg SubCUTAneous Daily    ondansetron (ZOFRAN-ODT) disintegrating tablet 4 mg  4 mg Oral Q8H PRN    Or    ondansetron (ZOFRAN) injection 4 mg  4 mg IntraVENous Q6H PRN    polyethylene glycol (GLYCOLAX) packet 17 g   17 g Oral Daily PRN    acetaminophen (TYLENOL) tablet 650 mg  650 mg Oral Q6H PRN    Or    acetaminophen (TYLENOL) suppository 650 mg  650 mg Rectal Q6H PRN    lactated ringers IV soln infusion   IntraVENous Continuous    acetaminophen (OFIRMEV) infusion 1,000 mg  1,000 mg IntraVENous Q6H PRN    diphenhydrAMINE (BENADRYL) injection 12.5 mg  12.5 mg IntraVENous Q6H PRN    HYDROmorphone (DILAUDID) injection 1 mg  1 mg IntraVENous Q4H PRN    pantoprazole (PROTONIX) 40 mg in sodium chloride (PF) 0.9 % 10 mL injection  40 mg IntraVENous Daily         Intake and Output:  Current Shift:  No intake/output data recorded.  Last three shifts:  No intake/output data recorded.    Lab/Data Reviewed:  All lab and imaging  results for the last 24 hours reviewed.    Stephanie Coup, MD  P# [phone]  December 16, 2021    [facility] medical dictation software was used for portions of this report.  Unintended voice transcription errors may have occurred.

## 2021-12-16 NOTE — Care Coordination-Inpatient (Signed)
Discharge plan: Home with sister. Monitor for needs. Medicaid Cab ride home.       12/16/21 1345   Service Assessment   Patient Orientation Alert and Oriented   Cognition Alert   History Provided By Patient   Primary Caregiver Self   Support Systems Family Members;Case Manager/Social Engineer, structural is: Patient Declined Theme park manager Next of Kin Remains as Media planner)   PCP Verified by CM Yes   Last Visit to PCP Within last 3 months   Prior Functional Level Independent in ADLs/IADLs   Current Functional Level Independent in ADLs/IADLs   Can patient return to prior living arrangement Yes   Ability to make needs known: Good   Family able to assist with home care needs: Yes   Would you like for me to discuss the discharge plan with any other family members/significant others, and if so, who? Yes   Solicitor None   CM/SW Referral Disease Management Education   Social/Functional History   Lives With Family   Type of Home Apartment   Home Layout One level   Bathroom Shower/Tub Tub/Shower unit   [place] Equipment None   Receives Help From Family   ADL Assistance Independent   Homemaking Assistance Independent   Homemaking Responsibilities Yes   Ambulation Assistance Independent   Transfer Assistance Independent   Active Driver No   Patient's Driver Info Sister [place]   Mode of Scientist, product/process development   Occupation Unemployed   Discharge Planning   Type of Residence Apartment   Living Arrangements Family Members   Current Services Prior To Admission None   Potential Assistance Needed N/A   DME Ordered? No   Potential Assistance Purchasing Medications No   Type of Home Care Services None   Patient expects to be discharged to: Apartment   One/Two Story Residence One story   History of falls? 0   Services At/After Discharge   Transition of Care Consult (CM Consult) N/A   Services At/After Discharge None   [employer] Information  Provided? No   Mode of Transport at Discharge BLS  (Medicaid Cab)   Confirm Follow Up Transport Family   Condition of Participation: Discharge Planning   The Patient and/or Patient Representative was provided with a Choice of Provider? Patient   The Patient and/Or Patient Representative agree with the Discharge Plan? Yes   Freedom of Choice list was provided with basic dialogue that supports the patient's individualized plan of care/goals, treatment preferences, and shares the quality data associated with the providers?  Yes

## 2021-12-16 NOTE — Telephone Encounter (Signed)
----------  DocumentID: [id number] (AP)-------------------------------------------              [facility]                       Patient Education Report         Name: Rhonda Hammond, Rhonda Hammond                  Date: 12/16/2021    MRN: [id number]                    Time: 10:24:29 PM         Patient ordered video: 'Patient Safety: Stay Safe While you are in the Hospital'    from [id number] via phone number: [phone] at 10:24:29 PM    Description: This program outlines some of the precautions patients can take to ensure a speedy recovery without extra complications. The video emphasizes the importance of communicating with the healthcare team.

## 2021-12-16 NOTE — ED Notes (Signed)
TRANSFER - OUT REPORT:    Verbal report given to Simon,RN on Harrietta M Catala being transferred to 2308 for routine progression of patient care       Report consisted of patient's Situation, Background, Assessment and   Recommendations(SBAR).     Information from the following report(s) Nurse Handoff Report, Index, ED Encounter Summary, ED SBAR, MAR, and Recent Results was reviewed with the receiving nurse.  Kinder Assessment: No data recorded  Lines:   Peripheral IV 12/15/21 Right Antecubital (Active)       Peripheral IV 12/15/21 Posterior;Right Hand (Active)      Medications sent with patient from pharmacy: None to send  Patient belongings: Belongings sent to floor    Opportunity for questions and clarification was provided.      Patient transported with:  Rebecca Eaton, Cherene Altes, RN  12/16/21 [phone]

## 2021-12-17 LAB — CBC
Hematocrit: 22.2 % — ABNORMAL LOW (ref 35.0–47.0)
Hemoglobin: 7.6 gm/dl — ABNORMAL LOW (ref 11.0–16.0)
MCH: 25.2 pg — ABNORMAL LOW (ref 25.4–34.6)
MCHC: 34.2 gm/dl (ref 30.0–36.0)
MCV: 73.8 fL — ABNORMAL LOW (ref 80.0–98.0)
MPV: 10.5 fL — ABNORMAL HIGH (ref 6.0–10.0)
Platelets: 158 10*3/uL (ref 140–450)
RBC: 3.01 M/uL — ABNORMAL LOW (ref 3.60–5.20)
RDW: 49.2 — ABNORMAL HIGH (ref 36.4–46.3)
WBC: 7.2 10*3/uL (ref 4.0–11.0)

## 2021-12-17 LAB — RENAL FUNCTION PANEL
Albumin: 3.3 gm/dl — ABNORMAL LOW (ref 3.4–5.0)
Anion Gap: 6 mmol/L (ref 5–15)
BUN: 5 mg/dl — ABNORMAL LOW (ref 9–23)
CO2: 26 mEq/L (ref 20–31)
Calcium: 8.7 mg/dl (ref 8.7–10.4)
Chloride: 105 mEq/L (ref 98–107)
Creatinine: 0.54 mg/dl — ABNORMAL LOW (ref 0.55–1.02)
GFR African American: >60
GFR Non-African American: >60
Glucose: 82 mg/dl (ref 74–106)
Phosphorus: 3.6 mg/dL (ref 2.4–5.1)
Potassium: 3.1 mEq/L — ABNORMAL LOW (ref 3.5–5.1)
Sodium: 137 mEq/L (ref 136–145)

## 2021-12-17 LAB — LIPASE: Lipase: 111 U/L — ABNORMAL HIGH (ref 12–53)

## 2021-12-17 MED FILL — PANTOPRAZOLE SODIUM 40 MG IV SOLR: 40 MG | INTRAVENOUS | Qty: 40

## 2021-12-17 MED FILL — ENOXAPARIN SODIUM 40 MG/0.4ML IJ SOSY: 40 MG/0.4ML | INTRAMUSCULAR | Qty: 0.4

## 2021-12-17 MED FILL — ACETAMINOPHEN 325 MG PO TABS: 325 MG | ORAL | Qty: 2

## 2021-12-17 MED FILL — ONDANSETRON 4 MG PO TBDP: 4 MG | ORAL | Qty: 1

## 2021-12-17 MED FILL — SODIUM CHLORIDE FLUSH 0.9 % IV SOLN: 0.9 % | INTRAVENOUS | Qty: 10

## 2021-12-17 MED FILL — HYDROMORPHONE HCL 1 MG/ML IJ SOLN: 1 MG/ML | INTRAMUSCULAR | Qty: 1

## 2021-12-17 NOTE — Progress Notes (Signed)
HOSPITALIST PROGRESS NOTE    Patient: Rhonda Hammond Age: 31 y.o. Sex: female    Date of Birth: September 15, 1990 Admit Date: 12/15/2021 PCP: Danny Lawless, MD   MRN: [id number]  CSN: [id number]       Daily Progress Note: 12/17/2021    Pt with acute on chronic pancreatitis  Still CO abd pain    Assessment/Plan:     Pancreatitis, Acute on Chronic --supportive care and PRN pain meds    Pancreatic pseudocyst with enlargement, 4.8 x 3.6 cm     Abdominal pain PPI and PRN pain meds    Nausea and vomiting antiemetics prn    Dehydration IVF    Chronic Anemia stable no acute issues      Diet: clear liquid as tolerated  DVT ppx: subQ heparin       Subjective:       Review of Systems   Constitutional:  Positive for fatigue.   HENT: Negative.     Eyes: Negative.    Respiratory: Negative.     Cardiovascular: Negative.    Gastrointestinal:  Positive for abdominal pain and constipation. Negative for anal bleeding, blood in stool, diarrhea and nausea.   Genitourinary: Negative.    Musculoskeletal: Negative.    Skin: Negative.    Neurological: Negative.    Hematological: Negative.          Objective:     Physical Exam:     BP 131/84   Pulse 63   Temp 97.5 F (36.4 C) (Temporal)   Resp 18   Ht 1.651 m (5\' 5" )   Wt 96.6 kg (213 lb)   SpO2 98%   BMI 35.45 kg/m         Temp (24hrs), Avg:98.2 F (36.8 C), Min:97.5 F (36.4 C), Max:98.8 F (37.1 C)    No intake/output data recorded.   11/30 1901 - 12/02 0700  In: 450 [P.O.:450]  Out: 750 [Urine:750]    Physical Exam  Vitals and nursing note reviewed.   Constitutional:       General: She is not in acute distress.     Appearance: She is ill-appearing.   HENT:      Head: Normocephalic.   Eyes:      Extraocular Movements: Extraocular movements intact.      Pupils: Pupils are equal, round, and reactive to light.   Cardiovascular:      Rate and Rhythm: Normal rate and regular rhythm.   Pulmonary:      Effort: Pulmonary effort is normal. No respiratory distress.   Abdominal:       General: Bowel sounds are normal.      Palpations: Abdomen is soft.   Musculoskeletal:         General: Normal range of motion.      Cervical back: Normal range of motion.   Skin:     General: Skin is warm and dry.   Neurological:      General: No focal deficit present.      Mental Status: She is alert and oriented to person, place, and time.          Data Review:       24 Hour Results:  Recent Results (from the past 24 hour(s))   Renal Function Panel    Collection Time: 12/17/21  5:21 AM   Result Value Ref Range    Potassium 3.1 (L) 3.5 - 5.1 mEq/L    Chloride 105 98 - 107 mEq/L    Sodium  137 136 - 145 mEq/L    CO2 26 20 - 31 mEq/L    Glucose 82 74 - 106 mg/dl    BUN 5 (L) 9 - 23 mg/dl    Creatinine 0.54 (L) 0.55 - 1.02 mg/dl    GFR African American >60.0      GFR Non-African American >60      Calcium 8.7 8.7 - 10.4 mg/dl    Anion Gap 6 5 - 15 mmol/L    Albumin 3.3 (L) 3.4 - 5.0 gm/dl    Phosphorus 3.6 2.4 - 5.1 mg/dL   CBC    Collection Time: 12/17/21  5:21 AM   Result Value Ref Range    WBC 7.2 4.0 - 11.0 1000/mm3    RBC 3.01 (L) 3.60 - 5.20 M/uL    Hemoglobin 7.6 (L) 11.0 - 16.0 gm/dl    Hematocrit 22.2 (L) 35.0 - 47.0 %    MCV 73.8 (L) 80.0 - 98.0 fL    MCH 25.2 (L) 25.4 - 34.6 pg    MCHC 34.2 30.0 - 36.0 gm/dl    Platelets 158 140 - 450 1000/mm3    MPV 10.5 (H) 6.0 - 10.0 fL    RDW 49.2 (H) 36.4 - 46.3     Lipase    Collection Time: 12/17/21  5:21 AM   Result Value Ref Range    Lipase 111 (H) 12 - 53 U/L       Problem List:       Medications reviewed  Current Facility-Administered Medications   Medication Dose Route Frequency    sodium chloride flush 0.9 % injection 5-40 mL  5-40 mL IntraVENous 2 times per day    sodium chloride flush 0.9 % injection 5-40 mL  5-40 mL IntraVENous PRN    0.9 % sodium chloride infusion   IntraVENous PRN    magnesium sulfate 2000 mg in 50 mL IVPB premix  2,000 mg IntraVENous PRN    enoxaparin (LOVENOX) injection 40 mg  40 mg SubCUTAneous Daily    ondansetron (ZOFRAN-ODT)  disintegrating tablet 4 mg  4 mg Oral Q8H PRN    Or    ondansetron (ZOFRAN) injection 4 mg  4 mg IntraVENous Q6H PRN    polyethylene glycol (GLYCOLAX) packet 17 g  17 g Oral Daily PRN    acetaminophen (TYLENOL) tablet 650 mg  650 mg Oral Q6H PRN    Or    acetaminophen (TYLENOL) suppository 650 mg  650 mg Rectal Q6H PRN    lactated ringers IV soln infusion   IntraVENous Continuous    acetaminophen (OFIRMEV) infusion 1,000 mg  1,000 mg IntraVENous Q6H PRN    diphenhydrAMINE (BENADRYL) injection 12.5 mg  12.5 mg IntraVENous Q6H PRN    HYDROmorphone (DILAUDID) injection 1 mg  1 mg IntraVENous Q4H PRN    pantoprazole (PROTONIX) 40 mg in sodium chloride (PF) 0.9 % 10 mL injection  40 mg IntraVENous Daily        Care Plan discussed with: Patient/Family and Nurse    Total clinical care time was 55 minutes of which more than 50% was spent in coordination of care and counseling (time spent with patient/family face to face, physical exam, reviewing laboratory and imaging investigations, speaking with physicians and nursing staff involved in this patient's care).        Humna Moorehouse Desma Paganini, MD  December 17, 2021  10:19 AM

## 2021-12-18 LAB — RENAL FUNCTION PANEL
Albumin: 3.2 gm/dl — ABNORMAL LOW (ref 3.4–5.0)
Anion Gap: 6 mmol/L (ref 5–15)
BUN: <5 mg/dl — ABNORMAL LOW (ref 9–23)
CO2: 27 mEq/L (ref 20–31)
Calcium: 8.5 mg/dl — ABNORMAL LOW (ref 8.7–10.4)
Chloride: 106 mEq/L (ref 98–107)
Creatinine: 0.4 mg/dl — ABNORMAL LOW (ref 0.55–1.02)
GFR African American: >60
GFR Non-African American: >60
Glucose: 81 mg/dl (ref 74–106)
Phosphorus: 3.8 mg/dL (ref 2.4–5.1)
Potassium: 3.2 mEq/L — ABNORMAL LOW (ref 3.5–5.1)
Sodium: 139 mEq/L (ref 136–145)

## 2021-12-18 LAB — CBC
Hematocrit: 22.4 % — ABNORMAL LOW (ref 35.0–47.0)
Hemoglobin: 7.5 gm/dl — ABNORMAL LOW (ref 11.0–16.0)
MCH: 24.8 pg — ABNORMAL LOW (ref 25.4–34.6)
MCHC: 33.5 gm/dl (ref 30.0–36.0)
MCV: 73.9 fL — ABNORMAL LOW (ref 80.0–98.0)
MPV: 10.3 fL — ABNORMAL HIGH (ref 6.0–10.0)
Platelets: 156 10*3/uL (ref 140–450)
RBC: 3.03 M/uL — ABNORMAL LOW (ref 3.60–5.20)
RDW: 49.5 — ABNORMAL HIGH (ref 36.4–46.3)
WBC: 5.2 10*3/uL (ref 4.0–11.0)

## 2021-12-18 LAB — LIPASE: Lipase: 67 U/L — ABNORMAL HIGH (ref 12–53)

## 2021-12-18 LAB — MAGNESIUM: Magnesium: 1.6 mg/dL (ref 1.6–2.6)

## 2021-12-18 MED ORDER — OXYCODONE HCL 5 MG PO TABS
5 | ORAL | Status: DC | PRN
Start: 2021-12-18 — End: 2021-12-19
  Administered 2021-12-18 – 2021-12-19 (×5): 5 mg via ORAL

## 2021-12-18 MED ORDER — POTASSIUM CHLORIDE 10 MEQ/100ML IV SOLN
10 MEQ/0ML | INTRAVENOUS | Status: AC
Start: 2021-12-18 — End: 2021-12-18
  Administered 2021-12-18 (×4): 10 meq via INTRAVENOUS

## 2021-12-18 MED FILL — OXYCODONE HCL 5 MG PO TABS: 5 MG | ORAL | Qty: 1

## 2021-12-18 MED FILL — POTASSIUM CHLORIDE 10 MEQ/100ML IV SOLN: 10 MEQ/0ML | INTRAVENOUS | Qty: 100

## 2021-12-18 MED FILL — HYDROMORPHONE HCL 1 MG/ML IJ SOLN: 1 MG/ML | INTRAMUSCULAR | Qty: 1

## 2021-12-18 MED FILL — SODIUM CHLORIDE FLUSH 0.9 % IV SOLN: 0.9 % | INTRAVENOUS | Qty: 10

## 2021-12-18 MED FILL — PANTOPRAZOLE SODIUM 40 MG IV SOLR: 40 MG | INTRAVENOUS | Qty: 40

## 2021-12-18 MED FILL — ENOXAPARIN SODIUM 40 MG/0.4ML IJ SOSY: 40 MG/0.4ML | INTRAMUSCULAR | Qty: 0.4

## 2021-12-18 MED FILL — ACETAMINOPHEN 325 MG PO TABS: 325 MG | ORAL | Qty: 2

## 2021-12-18 NOTE — Progress Notes (Signed)
HOSPITALIST PROGRESS NOTE    Patient: Rhonda Hammond Age: 31 y.o. 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   HENT: Negative.     Eyes: Negative.    Respiratory: Negative.     Cardiovascular: Negative.    Gastrointestinal:  Positive for abdominal pain and constipation. Negative for anal bleeding, blood in stool, diarrhea and nausea.   Genitourinary: Negative.    Musculoskeletal: Negative.    Skin: Negative.    Neurological: Negative.    Hematological: Negative.          Objective:     Physical Exam:     BP (!) 147/105   Pulse 73   Temp 97.9 F (36.6 C)   Resp 18   Ht 1.651 m (5\' 5" )   Wt 96.6 kg (213 lb)   SpO2 97%   BMI 35.45 kg/m         Temp (24hrs), Avg:98.3 F (36.8 C), Min:97.3 F (36.3 C), Max:99 F (37.2 C)    No intake/output data recorded.   12/01 1901 - 12/03 0700  In: 320 [P.O.:320]  Out: 750 [Urine:750]    Physical Exam  Vitals and nursing note reviewed.   Constitutional:       General: She is not in acute distress.     Appearance: She is ill-appearing.   HENT:      Head: Normocephalic.   Eyes:      Extraocular Movements: Extraocular movements intact.      Pupils: Pupils are equal, round, and reactive to light.   Cardiovascular:      Rate and Rhythm: Normal rate and regular rhythm.   Pulmonary:      Effort: Pulmonary effort is normal. No  respiratory distress.   Abdominal:      General: Bowel sounds are normal.      Palpations: Abdomen is soft.   Musculoskeletal:         General: Normal range of motion.      Cervical back: Normal range of motion.   Skin:     General: Skin is warm and dry.   Neurological:      General: No focal deficit present.      Mental Status: She is alert and oriented to person, place, and time.          Data Review:       24 Hour Results:  Recent Results (from the past 24 hour(s))   Renal Function Panel    Collection Time: 12/18/21  4:13 AM   Result Value Ref Range    Potassium 3.2 (L) 3.5 - 5.1 mEq/L    Chloride  106 98 - 107 mEq/L    Sodium 139 136 - 145 mEq/L    CO2 27 20 - 31 mEq/L    Glucose 81 74 - 106 mg/dl    BUN <5 (L) 9 - 23 mg/dl    Creatinine 9.56 (L) 0.55 - 1.02 mg/dl    GFR African American >60.0      GFR Non-African American >60      Calcium 8.5 (L) 8.7 - 10.4 mg/dl    Anion Gap 6 5 - 15 mmol/L    Albumin 3.2 (L) 3.4 - 5.0 gm/dl    Phosphorus 3.8 2.4 - 5.1 mg/dL   CBC    Collection Time: 12/18/21  4:13 AM   Result Value Ref Range    WBC 5.2 4.0 - 11.0 1000/mm3    RBC 3.03 (L) 3.60 - 5.20 M/uL    Hemoglobin 7.5 (L) 11.0 - 16.0 gm/dl    Hematocrit 38.7 (L) 35.0 - 47.0 %    MCV 73.9 (L) 80.0 - 98.0 fL    MCH 24.8 (L) 25.4 - 34.6 pg    MCHC 33.5 30.0 - 36.0 gm/dl    Platelets 564 332 - 450 1000/mm3    MPV 10.3 (H) 6.0 - 10.0 fL    RDW 49.5 (H) 36.4 - 46.3     Lipase    Collection Time: 12/18/21  4:13 AM   Result Value Ref Range    Lipase 67 (H) 12 - 53 U/L   Magnesium    Collection Time: 12/18/21  4:13 AM   Result Value Ref Range    Magnesium 1.6 1.6 - 2.6 mg/dL       Problem List:       Medications reviewed  Current Facility-Administered Medications   Medication Dose Route Frequency    oxyCODONE (ROXICODONE) immediate release tablet 5 mg  5 mg Oral Q4H PRN    sodium chloride flush 0.9 % injection 5-40 mL  5-40 mL IntraVENous PRN    0.9 % sodium chloride infusion   IntraVENous PRN    magnesium sulfate 2000 mg in 50 mL  IVPB premix  2,000 mg IntraVENous PRN    enoxaparin (LOVENOX) injection 40 mg  40 mg SubCUTAneous Daily    ondansetron (ZOFRAN-ODT) disintegrating tablet 4 mg  4 mg Oral Q8H PRN    Or    ondansetron (ZOFRAN) injection 4 mg  4 mg IntraVENous Q6H PRN    polyethylene glycol (GLYCOLAX) packet 17 g  17 g Oral Daily PRN    acetaminophen (TYLENOL) tablet 650 mg  650 mg Oral Q6H PRN    Or    acetaminophen (TYLENOL) suppository 650 mg  650 mg Rectal Q6H PRN    lactated ringers IV soln infusion   IntraVENous Continuous    acetaminophen (OFIRMEV) infusion 1,000 mg  1,000 mg IntraVENous Q6H PRN    diphenhydrAMINE (BENADRYL) injection 12.5 mg  12.5 mg IntraVENous Q6H PRN    HYDROmorphone (DILAUDID) injection 1 mg  1 mg IntraVENous Q4H PRN    pantoprazole (PROTONIX) 40 mg in sodium chloride (PF) 0.9 % 10 mL injection  40 mg IntraVENous Daily        Care Plan discussed with: Patient/Family and Nurse    Total clinical care time was 55 minutes of which more than 50% was spent in coordination of care and counseling (time spent with patient/family face to face, physical exam, reviewing laboratory and imaging investigations, speaking with physicians and nursing staff involved in this patient's care).  Ronie Fleeger Mora Appl, MD  December 18, 2021  5:49 PM

## 2021-12-19 LAB — RENAL FUNCTION PANEL
Albumin: 3.2 gm/dl — ABNORMAL LOW (ref 3.4–5.0)
Anion Gap: 7 mmol/L (ref 5–15)
BUN: <5 mg/dl — ABNORMAL LOW (ref 9–23)
CO2: 27 mEq/L (ref 20–31)
Calcium: 8.8 mg/dl (ref 8.7–10.4)
Chloride: 106 mEq/L (ref 98–107)
Creatinine: 0.44 mg/dl — ABNORMAL LOW (ref 0.55–1.02)
GFR African American: >60
GFR Non-African American: >60
Glucose: 86 mg/dl (ref 74–106)
Phosphorus: 3.6 mg/dL (ref 2.4–5.1)
Potassium: 3.6 mEq/L (ref 3.5–5.1)
Sodium: 140 mEq/L (ref 136–145)

## 2021-12-19 LAB — CBC
Hematocrit: 21.9 % — ABNORMAL LOW (ref 35.0–47.0)
Hemoglobin: 7.8 gm/dl — ABNORMAL LOW (ref 11.0–16.0)
MCH: 26.3 pg (ref 25.4–34.6)
MCHC: 35.6 gm/dl (ref 30.0–36.0)
MCV: 73.7 fL — ABNORMAL LOW (ref 80.0–98.0)
MPV: 10.8 fL — ABNORMAL HIGH (ref 6.0–10.0)
Platelets: 174 10*3/uL (ref 140–450)
RBC: 2.97 M/uL — ABNORMAL LOW (ref 3.60–5.20)
RDW: 47.9 — ABNORMAL HIGH (ref 36.4–46.3)
WBC: 4.6 10*3/uL (ref 4.0–11.0)

## 2021-12-19 LAB — LIPASE: Lipase: 56 U/L — ABNORMAL HIGH (ref 12–53)

## 2021-12-19 LAB — MAGNESIUM: Magnesium: 1.5 mg/dL — ABNORMAL LOW (ref 1.6–2.6)

## 2021-12-19 MED ORDER — PANTOPRAZOLE SODIUM 40 MG PO TBEC
40 MG | ORAL_TABLET | Freq: Two times a day (BID) | ORAL | 1 refills | Status: DC
Start: 2021-12-19 — End: 2022-04-15

## 2021-12-19 MED ORDER — OXYCODONE-ACETAMINOPHEN 7.5-325 MG PO TABS
ORAL_TABLET | ORAL | 0 refills | Status: AC | PRN
Start: 2021-12-19 — End: 2021-12-26

## 2021-12-19 MED FILL — HYDROMORPHONE HCL 1 MG/ML IJ SOLN: 1 MG/ML | INTRAMUSCULAR | Qty: 1

## 2021-12-19 MED FILL — PANTOPRAZOLE SODIUM 40 MG IV SOLR: 40 MG | INTRAVENOUS | Qty: 40

## 2021-12-19 MED FILL — OXYCODONE HCL 5 MG PO TABS: 5 MG | ORAL | Qty: 1

## 2021-12-19 NOTE — Discharge Instructions (Signed)
DISCHARGE SUMMARY from Nurse        The discharge information has been reviewed with the patient.  The patient verbalized understanding.  Discharge medications reviewed with the patient and appropriate educational materials and side effects teaching were provided.  ___________________________________________________________________________________________________________________________________

## 2021-12-19 NOTE — Discharge Summary (Signed)
Discharge Summary   Admit Date: 12/15/2021  Discharge Date:  12/19/2021    Patient ID:  Rhonda Hammond  31 y.o.  1990-04-11    Chief Complaint   Patient presents with    Pancreatitis       Patient Active Problem List    Diagnosis Date Noted    Acute pancreatitis without infection or necrosis 12/15/2021    Chronic alcoholic pancreatitis (HCC) 11/19/2020    Pancreatic pseudocyst/cyst 11/19/2020    Intractable nausea and vomiting 06/26/2020    Intractable vomiting 06/26/2020    Obesity 12/16/2018    Acute on chronic pancreatitis (HCC) 12/16/2018    Acute pancreatitis 12/16/2018    Chronic abdominal pain 12/16/2018       Discharge Diagnosis:    Pancreatitis acute on chronic due to EtOH  Pancreatic Pseudocyst with enlargement  Abd pain due to above  Nausea and vomiting due to above  Dehydration due to N/V  Chronic anemia.     Current Discharge Medication List        START taking these medications    Details   oxyCODONE-acetaminophen (PERCOCET) 7.5-325 MG per tablet Take 1 tablet by mouth every 4 hours as needed for Pain for up to 7 days. Max Daily Amount: 6 tablets  Qty: 15 tablet, Refills: 0    Comments: Reduce doses taken as pain becomes manageable  Associated Diagnoses: Acute pancreatitis, unspecified complication status, unspecified pancreatitis type           CONTINUE these medications which have CHANGED    Details   pantoprazole (PROTONIX) 40 MG tablet Take 1 tablet by mouth 2 times daily (before meals)  Qty: 60 tablet, Refills: 1           CONTINUE these medications which have NOT CHANGED    Details   !! ondansetron (ZOFRAN) 4 MG tablet Take 1 tablet by mouth every 8 hours as needed for Nausea or Vomiting  Qty: 20 tablet, Refills: 0      albuterol sulfate HFA (PROVENTIL;VENTOLIN;PROAIR) 108 (90 Base) MCG/ACT inhaler Inhale 2 puffs into the lungs every 6 hours as needed for Wheezing  Qty: 18 g, Refills: 3      !! ondansetron (ZOFRAN) 4 MG tablet Take 1 tablet by mouth every 8 hours as needed for Nausea or  Vomiting  Qty: 12 tablet, Refills: 0      albuterol (ACCUNEB) 0.63 MG/3ML nebulizer solution Inhale 0.63 mg into the lungs every 6 hours as needed       !! - Potential duplicate medications found. Please discuss with provider.        STOP taking these medications       UNABLE TO FIND Comments:   Reason for Stopping:         famotidine (PEPCID) 20 MG tablet Comments:   Reason for Stopping:         promethazine (PHENERGAN) 25 MG tablet Comments:   Reason for Stopping:         dicyclomine (BENTYL) 20 MG tablet Comments:   Reason for Stopping:         haloperidol (HALDOL) 5 MG tablet Comments:   Reason for Stopping:               Operative Procedures:  none    Consultants:  none    Hospital Course:  Rhonda Hammond is a 31 y.o. female with Hx of alcoholic gastritis, alcohol induced chronic pancreatitis, alcohol abuse, cocaine abuse, and obesity who presents to the  ED with complaints of upper abdominal pain radiating to her back with associated nausea and vomiting over the past 2 days prior to evaluation.  Patient was previously seen in the [place] system 2 times earlier in the day.  Patient states the pain has again worsened, so she has come in to the ED for further evaluation and treatment.  She feels that her symptoms are consistent with her pancreatitis episodes in the past.  Patient states her last alcohol drink was 3 days PTA.  She denies any fever or chills.  She has not had any SOB or chest pain.  She has not had any diarrhea.  She denies any blood in her emesis or stools.  Her last BM was 2 days ago.  The patient has seen Dr Gwenith Daily group in the past for her GI care.  Admission was recommended for further management.     She was admitted and NPO status was maintained. IV NS was started. PRN pain meds were provided. CIWA protocols were maintained. As her condition improved. Lipast trends have been monitored and have decreased. Routine labs have been monitored with electrolyte replacement where  needed.     Clear liquids were provided and well tolerated. 12/3 her diet was advanced to solid food and well tolerated. A CT of the Abd pelvis was done that confirmed a pseudocyst. Her pain has improved and is controlled now with oral pain meds. She feels capable of caring for herself at home now with family support. EtOH cessation has been supported. She will FU with her PCP for continued outpatient care .    Physical Exam on Discharge:  BP (!) 124/94 Comment: Notified Nurse  Pulse 67   Temp 98.2 F (36.8 C) (Temporal)   Resp 18   Ht 1.651 m (5\' 5" )   Wt 96.6 kg (213 lb)   SpO2 100%   BMI 35.45 kg/m       Constitutional: well developed, nourished, no distress and alert and oriented x 3   HENT: atraumatic, nose normal, normocephalic, left exterior ear normal, right external ear normal and oropharynx clear and moist   Eyes: conjunctiva normal, EOM normal and PERRL   Neck: ROM normal, supple, trachea normal and cervical adenopathy   Cardiovascular: heart sounds normal, intact distal pulses, normal rate and regular rhythm   Pulmonary/Chest Wall: breath sounds normal and effort normal   Abdominal: appearance normal, bowel sounds normal and soft   Genitourinary/Anorectal: deferred   Musculoskeletal: normal ROM   Neurological: awake, alert and oriented x 3 and gait normal   Skin: dry, intact and warm   Psych:  appropriate           Most Recent BMP and CBC:    Basic Metabolic Profile   Lab Results   Component Value Date/Time    NA 140 12/19/2021 07:53 AM    NA 139 12/18/2021 04:13 AM    NA 137 12/17/2021 05:21 AM    K 3.6 12/19/2021 07:53 AM    K 3.2 12/18/2021 04:13 AM    K 3.1 12/17/2021 05:21 AM    CL 106 12/19/2021 07:53 AM    CL 106 12/18/2021 04:13 AM    CL 105 12/17/2021 05:21 AM    CO2 27 12/19/2021 07:53 AM    CO2 27 12/18/2021 04:13 AM    CO2 26 12/17/2021 05:21 AM    CO2 28 05/07/2020 03:25 AM    BUN <5 12/19/2021 07:53 AM    BUN <5 12/18/2021 04:13  AM    BUN 5 12/17/2021 05:21 AM    GFRAA >60.0  12/19/2021 07:53 AM    GFRAA >60.0 12/18/2021 04:13 AM    GFRAA >60.0 12/17/2021 05:21 AM           CBC w/Diff    Lab Results   Component Value Date/Time    WBC 4.6 12/19/2021 07:53 AM    WBC 5.2 12/18/2021 04:13 AM    WBC 7.2 12/17/2021 05:21 AM    HGB 7.8 12/19/2021 07:53 AM    HGB 7.5 12/18/2021 04:13 AM    HGB 7.6 12/17/2021 05:21 AM    HCT 21.9 12/19/2021 07:53 AM    HCT 22.4 12/18/2021 04:13 AM    HCT 22.2 12/17/2021 05:21 AM    PLT 174 12/19/2021 07:53 AM    PLT 156 12/18/2021 04:13 AM    PLT 158 12/17/2021 05:21 AM    MCV 73.7 12/19/2021 07:53 AM    MCV 73.9 12/18/2021 04:13 AM    MCV 73.8 12/17/2021 05:21 AM       Recent Labs     12/17/21  0521 12/18/21  0413 12/19/21  0753   WBC 7.2 5.2 4.6   RBC 3.01* 3.03* 2.97*   HGB 7.6* 7.5* 7.8*   HCT 22.2* 22.4* 21.9*   PLT 158 156 174            Condition at discharge:  Afebrile, Ambulating Eating, Drinking, Voiding, Stable    Disposition:  Home    Home or Self Care    PCP:  Danny Lawless, MD    Kealan Buchan Mora Appl, MD  December 19, 2021  2:41 PM

## 2021-12-19 NOTE — Plan of Care (Signed)
Problem: Pain  Goal: Verbalizes/displays adequate comfort level or baseline comfort level  12/19/2021 1447 by Nechama Guard, RN  Outcome: Completed  12/19/2021 0723 by Prudencio Burly, RN  Outcome: Progressing  12/19/2021 0651 by Darien Ramus, RN  Outcome: Progressing     Problem: Risk for Elopement  Goal: Patient will not exit the unit/facility without proper excort  12/19/2021 1447 by Nechama Guard, RN  Outcome: Completed  12/19/2021 0723 by Prudencio Burly, RN  Outcome: Progressing  12/19/2021 0651 by Darien Ramus, RN  Outcome: Progressing  Flowsheets (Taken 12/19/2021 0000)  Nursing Interventions for Elopement Risk: Reduce environmental triggers     Problem: Safety - Adult  Goal: Free from fall injury  12/19/2021 1447 by Nechama Guard, RN  Outcome: Completed  12/19/2021 0723 by Prudencio Burly, RN  Outcome: Progressing  12/19/2021 0651 by Darien Ramus, RN  Outcome: Progressing

## 2021-12-19 NOTE — Progress Notes (Signed)
Pt found a ride and medicaid trip was cancelled per Nurse

## 2021-12-19 NOTE — Care Coordination-Inpatient (Signed)
Discharge Date: 12/19/2021    Discharge Location: Home    Discharge Needs: None    Transportation: Medicaid Cab requested     Communication with: Patient, RN, MD

## 2021-12-19 NOTE — Care Coordination-Inpatient (Signed)
Transport to: [place] [address]  Reason for transport: [place] set up with: Sherley Bounds [phone]  Time/Date: 12/19/2021 @ 1630-1930  D/C Summary loaded: NA  Nurse/CM notified: Yes  Envelope delivered: NA  Insurance verified on face sheet: Yes  Auth needed: Yes  Auth #: [id number]

## 2021-12-19 NOTE — Progress Notes (Signed)
Patient transported with all belongings via wheelchair to outpatient pharmacy for prescribed medications, then to main entrance for discharge home with friend.

## 2021-12-19 NOTE — Progress Notes (Signed)
To Whom it may concern,  Rhonda Hammond was admitted to [facility] from 12/15/2021 to 12/19/2021. Ms. Rankin may return to work with no restrictions on Tuesday, December 20, 2021. Thank you for your attention in this matter. For any questions or concerns please call [facility] at [phone].      Sincerely,       Lia Foyer, Charge RN   [facility] [address] [place]

## 2021-12-19 NOTE — Progress Notes (Signed)
IV catheter removed. Patient waiting on friend, who will provided transportation home.

## 2021-12-19 NOTE — Progress Notes (Signed)
Pager message sent to Dr. Robert Bellow: [phone] L[address]ie Aquas would like to speak with you she is upset about the return to work date she wants to return tomorrow thank you Abigail Butts RN [phone]

## 2021-12-19 NOTE — Progress Notes (Signed)
12/19/21,     To whom it may concern    Ms Kister has been hospitalized 12/15/21 to 12/19/21. Her excuse from work is justified. She may return to work 12/27/21.      Thank you        Salome Spotted, MD

## 2021-12-19 NOTE — Progress Notes (Signed)
IV catheter to be removed after administration of IV pain medication (around 1600). Discharge instructions provided and reviewed with patient. MD note provided.

## 2021-12-19 NOTE — Plan of Care (Signed)
Problem: Pain  Goal: Verbalizes/displays adequate comfort level or baseline comfort level  12/19/2021 0651 by Darien Ramus, RN  Outcome: Progressing  12/18/2021 1933 by Prudencio Burly, RN  Outcome: Not Progressing     Problem: Risk for Elopement  Goal: Patient will not exit the unit/facility without proper excort  12/19/2021 0651 by Darien Ramus, RN  Outcome: Progressing  Flowsheets (Taken 12/19/2021 0000)  Nursing Interventions for Elopement Risk: Reduce environmental triggers  12/18/2021 1933 by Prudencio Burly, RN  Outcome: Not Progressing     Problem: Safety - Adult  Goal: Free from fall injury  12/19/2021 0651 by Darien Ramus, RN  Outcome: Progressing  12/18/2021 1933 by Prudencio Burly, RN  Outcome: Not Progressing     Problem: Pain  Goal: Verbalizes/displays adequate comfort level or baseline comfort level  12/19/2021 0651 by Darien Ramus, RN  Outcome: Progressing  12/18/2021 1933 by Prudencio Burly, RN  Outcome: Not Progressing     Problem: Risk for Elopement  Goal: Patient will not exit the unit/facility without proper excort  12/19/2021 0651 by Darien Ramus, RN  Outcome: Progressing  Flowsheets (Taken 12/19/2021 0000)  Nursing Interventions for Elopement Risk: Reduce environmental triggers  12/18/2021 1933 by Prudencio Burly, RN  Outcome: Not Progressing     Problem: Safety - Adult  Goal: Free from fall injury  12/19/2021 0651 by Darien Ramus, RN  Outcome: Progressing  12/18/2021 1933 by Prudencio Burly, RN  Outcome: Not Progressing

## 2021-12-19 NOTE — Progress Notes (Signed)
Pager message sent to Dr. 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

## 2021-12-22 NOTE — ED Notes (Signed)
Formatting of this note might be different from the original.  Urine specimen collected, sent to ED stat lab.   Electronically signed by Alm Bustard, RN at 12/22/2021  1:31 PM EST

## 2021-12-22 NOTE — ED Provider Notes (Signed)
Formatting of this note is different from the original.    [facility] GENERAL EMERGENCY DEPARTMENT    Time of Arrival:   12/22/21 1038    Final diagnoses:   [K85.90] Acute pancreatitis, unspecified complication status, unspecified pancreatitis type (Primary)     Medical Decision Making:      Differential Diagnosis:     Patient presents emergency department for acute on chronic abdominal pain consistent with known pancreatitis.  Differential clues pancreatitis, cholecystitis, cholelithiasis, AKI, metabolic derangement    Social Determinants of Health:  social factors reviewed, did not limit treatment                               Supplemental Historians include:  patient    ED Course:   CBC, BMP, LFTs, lipase  IV pain control    After pain control in the emergency department with unremarkable labs, the patient was discharged with plans for continued outpatient pain control.  She understands the importance of strict return precautions for worsening pain, vomiting with inability keep down fluids.      As of 01/06/2022, 1:32 AM  The differential diagnosis and / or critical care lists were considered including infections, sepsis, severe sepsis, and septic shock and found unlikely unless otherwise documented in the final clinical impression or diagnosis list.     Documentation/Prior Results Review:  Old medical records, Nursing notes  Lipase 684 at time of admission to [facility] 11/30.    From [facility] discharge summary:  NA 140 12/19/2021 07:53 AM   NA 139 12/18/2021 04:13 AM   NA 137 12/17/2021 05:21 AM   K 3.6 12/19/2021 07:53 AM   K 3.2 12/18/2021 04:13 AM   K 3.1 12/17/2021 05:21 AM   CL 106 12/19/2021 07:53 AM   CL 106 12/18/2021 04:13 AM   CL 105 12/17/2021 05:21 AM   CO2 27 12/19/2021 07:53 AM   CO2 27 12/18/2021 04:13 AM   CO2 26 12/17/2021 05:21 AM   CO2 28 05/07/2020 03:25 AM   BUN <5 12/19/2021 07:53 AM   BUN <5 12/18/2021 04:13 AM   BUN 5 12/17/2021 05:21 AM   GFRAA >60.0 12/19/2021 07:53 AM   GFRAA >60.0  12/18/2021 04:13 AM   GFRAA >60.0 12/17/2021 05:21 AM     CBC w/Diff   Lab Results   Component Value Date/Time   WBC 4.6 12/19/2021 07:53 AM   WBC 5.2 12/18/2021 04:13 AM   WBC 7.2 12/17/2021 05:21 AM   HGB 7.8 12/19/2021 07:53 AM   HGB 7.5 12/18/2021 04:13 AM   HGB 7.6 12/17/2021 05:21 AM   HCT 21.9 12/19/2021 07:53 AM   HCT 22.4 12/18/2021 04:13 AM   HCT 22.2 12/17/2021 05:21 AM   PLT 174 12/19/2021 07:53 AM   PLT 156 12/18/2021 04:13 AM   PLT 158 12/17/2021 05:21 AM   MCV 73.7 12/19/2021 07:53 AM   MCV 73.9 12/18/2021 04:13 AM   MCV 73.8 12/17/2021 05:21 AM     CT at [facility]:  12/15/2021 9:41 PM EST   IMPRESSION:    1. Definite acute pancreatitis.  2. 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  3. Unchanged left ovarian cyst appearing 2.6 cm.    Electronically signed by: Cheryln Manly, MD 12/15/2021 9:41 PM EST            Workstation ID: [id number]  Rhythm interpretation from monitor:  N/A    Imaging Interpreted by me: Not Applicable    No orders to display     .     Discussion of Management with other Physicians, QHP or Appropriate Source:   None    .    Disposition:  Home    New Prescriptions    No medications on file     Chief Complaint   Patient presents with    ABDOMINAL PAIN     This is a 31 year old woman with history of chronic pancreatitis who presents to the emergency department for abdominal pain.  Since she was seen in this department last week, she was briefly admitted at [facility].  She states that over the past day, her abdominal pain has come back and is uncontrollable.  She is tearful throughout my interview.  Pain is primarily in the center of her abdomen, radiating to the back.  No fevers, chills.  She is nauseated and intermittently vomits.  She has diarrhea.  She states to me that this pain is similar to her normal chronic pancreatitis pain, and she is hopeful that she will not have to stay in the hospital tonight if her pain can be  controlled.        Review of Systems    Physical Exam  Vitals and nursing note reviewed.   Constitutional:       Appearance: She is not toxic-appearing.   HENT:      Head: Normocephalic and atraumatic.   Eyes:      General:         Right eye: No discharge.         Left eye: No discharge.   Cardiovascular:      Rate and Rhythm: Normal rate and regular rhythm.   Pulmonary:      Effort: Pulmonary effort is normal. No respiratory distress.   Abdominal:      General: There is no distension.      Palpations: Abdomen is soft.      Tenderness: There is abdominal tenderness (diffuse, worst epigastrum and periumbilical).   Musculoskeletal:         General: No swelling or deformity.   Skin:     General: Skin is warm and dry.   Neurological:      Mental Status: She is alert.   Psychiatric:         Mood and Affect: Mood normal.         Behavior: Behavior normal.     Past Medical History:   Diagnosis Date    Alcohol-induced acute pancreatitis     no longer drinks EtOH regularly    Class 1 obesity with body mass index (BMI) of 32.0 to 32.9 in adult     Gallstones     per pt report     Past Surgical History:   Procedure Laterality Date    LAP CHOLECYSTECTOMY N/A 02/10/2021    Procedure: CHOLECYSTECTOMY, LAPAROSCOPIC, WITH CHOLANGIOGRAM;  Surgeon: Wandra Scot, MD     Family History   Problem Relation Age of Onset    No Known Problems Mother     Other Family History Father         died from trauma     Social History     Occupational History    Not on file   Tobacco Use    Smoking status: Every Day     Types: Cigarettes    Smokeless tobacco: Never   Substance and Sexual Activity  Alcohol use: Not on file    Drug use: Not on file    Sexual activity: Not on file     No outpatient medications have been marked as taking for the 12/22/21 encounter [facility] Encounter).     Allergies   Allergen Reactions    Droperidol neurological reaction     Pt states "face twitching and couldn't stop legs from moving"     Vital Signs:  Patient Vitals  for the past 72 hrs:   Temp Heart Rate Pulse Resp BP BP Mean Weight   12/22/21 1041 98 F (36.7 C) 86 86 20 145/66 92 MM HG 96.6 kg (213 lb)     Diagnostics:  Labs:  No results found for this visit on 12/22/21.  ECG:  No results found for this visit on 12/22/21.    Medications ordered/given in the ED  Medications - No data to display      Electronically signed by Marla Roe, MD at 01/06/2022  1:32 AM EST

## 2021-12-22 NOTE — ED Triage Notes (Signed)
Formatting of this note might be different from the original.  Patient here with continued abdominal pain, nausea and vomiting after recent admission for pancreatitis.  Electronically signed by Ladona Ridgel, RN at 12/22/2021 10:42 AM EST

## 2021-12-22 NOTE — ED Notes (Signed)
Formatting of this note might be different from the original.  Patient premedicated prior to discharge.  Condition stable.  Patient discharged to home.  Patient education was completed:  yes  Education taught to:  patient  Teaching method used was discussion and handout.  Understanding of teaching was good.  Patient was discharged ambulatory.  Discharged with self.  Valuables were given to: patient.    Electronically signed by Berna Spare, RN at 12/22/2021  3:14 PM EST

## 2021-12-29 NOTE — ED Notes (Signed)
Formatting of this note might be different from the original.  Assumed care of pt oriented x4, NAD. Respirations easy and not labored. PIV left AC 20 gauge, infusing LR @ 100 ml/hr via IV pump. No IV related complications observed.   C/o abdominal pain, sharp in nature, with nausea/vomiting x couple of days. VSS. Pain currently 6/10.   Will continue to monitor.     Electronically signed by Pierce Crane, RN at 12/29/2021  9:59 PM EST

## 2021-12-29 NOTE — ED Notes (Signed)
Formatting of this note is different from the original.  Pt medicated per [facility] for pain in abdomen rated 10/10. Allergies verified prior to administration. Pt tolerated well. No acute deficits noted. Respirations even/unlabored.       12/29/21 2105   Vital signs   BP 145/89   BP Mean (!) 108 MM HG   Heart Rate 68   Temp 97.8 F (36.6 C)   Temp Source Oral   Resp 16   SpO2 99 %   Device (Oxygen Therapy) room air       Electronically signed by Parke Simmers, RN at 12/29/2021  9:13 PM EST

## 2021-12-29 NOTE — H&P (Signed)
Formatting of this note is different from the original.  Images from the original note were not included.      [facility]  Admission History and Physical    Patient:  Rhonda Hammond  Primary Care Provider:  None No PCP PCP, MD  Admitting Provider: Tamala Bari, MD  Date of Admission:  12/29/2021    Active Hospital Problems:     Active Hospital Problems    Diagnosis     Pancreatitis [K85.90]      Assessment/Plan:     # Recurrent pancreatitis  - afebrile, nl WBC count, CT AP shows slightly enlarged pseudocyst, walled off necrosis  []  admit to med surg  []  LR 100cc/h  []  IV PPI  []  PRN Tylenol 500 q6h, PRN IV Dilaudid 0.5-1mg  q4h, IV zofran  []  admit to med surg  []  EKG for QTc prolonging meds    # Hx heavy alcohol use  - has cut down to 1 glass of wine 1-2 days per week, last drink was 2 days ago, denies Hx withdrawal Sx  []  thiamine, MV, folate    # Anemia  []  check iron panel, B12 tmrw    # Smoking  - cigarettes sometimes and cannabis twice daily  - declined nicotine patch    # Reported Hx mild asthma  - no PFTs on file  - occasional albuterol  - recommend quitting smoking    Global Issues  DVT Prophylaxis: SQH  Diet: Clear liquids advance as tolerated  Code Status: Full  PCP: None No PCP PCP, MD    I plan to discuss [name] case with my attending. See attending note for final recommendations.   Subjective:    Subjective:     Chief Complaint:  Abd pain    History of Present Illness:  Rhonda Hammond is a 57y F with PMHx alcohol induced pancreatitis w/pseudocyst, who presented to ED on 12/13 for abdominal pain, N/V similar to presentation of prior episodes of pancreatitis. She left AMA. Returns today for same pain. CT AP shows walled-off necrosis and pseudocyst 4.8 cm which has enlarged from 2.7 cm on 11/10.  In ED, VSS, received 2L IVF and 4mg  IV morphine x2, 4mg  IV zofran.    PMH:  Past Medical History:   Diagnosis Date    Alcohol-induced acute pancreatitis     no longer drinks EtOH  regularly    Class 1 obesity with body mass index (BMI) of 32.0 to 32.9 in adult     Gallstones     per pt report     PSH:  Past Surgical History:   Procedure Laterality Date    LAP CHOLECYSTECTOMY N/A 02/10/2021    Procedure: CHOLECYSTECTOMY, LAPAROSCOPIC, WITH CHOLANGIOGRAM;  Surgeon: , MD     SH:  Social History     Tobacco Use    Smoking status: Every Day     Types: Cigarettes    Smokeless tobacco: Never     FH:  Family History   Problem Relation Age of Onset    No Known Problems Mother     Other Family History Father         died from trauma     Medications/Allergies:  Home Medication List - Marked as Reviewed on 11/22/21 1309   Medication Sig   albuterol (PROVENTIL, VENTOLIN, PROAIR) 90 mcg/actuation INH HFAA inhaler Take 2 Puffs inhaled by mouth Every 6 Hours As Needed.   albuterol (PROVENTIL, VENTOLIN, PROAIR) 90 mcg/actuation INH HFAA  inhaler Take 1-2 Puffs inhaled by mouth Every 6 hours.   Alum-Mag Hydroxide-Simeth DS (MYLANTA DS) 400-400-40 mg/5 mL PO suspension Take 15 mL by Mouth Every 6 Hours As Needed for Mild Pain (Pain Score 1-3). gastritis   azithromycin (ZITHROMAX Z-PAK) 250 mg PO TABS Take 2 tabs by mouth on day 1, then 1 tab daily on days 2-5   benzonatate (TESSALON) 100 mg PO CAPS Take 1 Cap by Mouth 3 Times Daily As Needed. Swallow pills whole   fluticasone propionate (FLONASE) 50 mcg/actuation NA SpSn 1 Spray by each nostril route Once a Day.   naproxen (NAPROSYN) 500 mg PO TABS Take 1 Tab by Mouth Twice Daily.   Omeprazole 40 mg PO CPDR Take 1 Cap by Mouth Once a Day.   ondansetron (ZOFRAN) 4 mg PO ODT. Take 1 Tab by Mouth Every 8 Hours.   prochlorperazine (COMPAZINE) 25 mg PR SUPP Insert 1 Suppository rectally Every 12 Hours As Needed (NAUSEA AND VOMITING).   sucralfate (CARAFATE) 100 mg/mL PO suspension Take 10 mL by Mouth Every 6 Hours As Needed.     Allergies   Allergen Reactions    Droperidol neurological reaction     Pt states "face twitching and couldn't stop legs from  moving"     Review of Systems:    Positives are bolded    Constitutional: denies any fevers/chills, fatigue or weight changes  HEENT: denies any H/A, change in vision or odynophagia  CV: no CP, palpitations or edema  Pulm: no shortness of breath or cough. No orthopnea or PND  GI: denies any abd pain, N/V, or change in bowel  GU: denies any dysuria or hematuria  MS: denies any joint or muscle pain.  Heme: denies any easy bleeding/bruising.  Neuro: denies any numbness or weakness.   Skin: no new skin rashes or lesions.     Objective:      BP 139/95   Pulse 60   Temp 97.4 F (36.3 C)   Resp 16   Ht 5\' 5"  (1.651 m)   Wt 95.7 kg (210 lb 14.4 oz)   SpO2 100%   BMI 35.10 kg/m  No intake or output data in the 24 hours ending 12/29/21 1950 Body mass index is 35.1 kg/m. @GFRCG @    Gen: WD/WN. NAD.   HENT: MMM. No o/p lesions.  Eyes: PERRL. EOMI.   Neck: No LAD. No Thyromegaly  CV: RRR. No m/r/g. No LE edema. DP pulses 2+   Pulm: CTAB. No crackles or wheezes. No accessory muscle use.  Abdomen: ND. +BS. Soft, mid epigastric TTP   GU: Deferred  MS: Normal muscle tone. No bony abnormality  Neuro: A/A/O x 4. Str 5/5 x 4. CN II-XII intact.  Skin: No petechiae. No rash. Warm/dry/intact.  Psych: Appropriate mood and affect.    Labwork and Ancillary Studies     Labs/Studies reviewed.   Pertinent labs/studies include:    CBC w/ [facility] Encounter on 12/29/21 (from the past 24 hour(s))   CBC WITH DIFFERENTIAL AUTO    Collection Time: 12/29/21  3:22 PM   Result Value Ref Range    WBC 6.3 4.0 - 11.0 K/uL    RBC 4.04 3.80 - 5.20 M/uL    HGB 10.0 (L) 11.7 - 15.5 g/dL    HCT 12/31/21 (L) 12/31/21 - 46.5 %    MCV 72 (L) 80 - 99 fL    MCH 25 (L) 26 - 34 pg    MCHC 34 31 -  36 g/dL    RDW 18.2 (H) 10.0 - 15.5 %    Platelet 313 140 - 440 K/uL    MPV 10.2 9.0 - 13.0 fL    Segmented Neutrophils (Auto) 63 40 - 75 %    Lymphocytes (Auto) 28 20 - 45 %    Monocytes (Auto) 7 3 - 12 %    Eosinophils (Auto) 2 0 - 6 %    Basophils (Auto) 1 0 - 2 %       Basic Metabolic Profile  Lab Results   Component Value Date    NA 138 12/29/2021    POTASSIUM 4.1 12/29/2021    CHLORIDE 103 12/29/2021    CO2 22 12/29/2021    BUN 4 (L) 12/29/2021    CREAT 0.5 12/29/2021    GLUCOSE 89 12/29/2021    CALCIUM 8.9 12/29/2021    MAGNESIUM 1.8 12/14/2021    PHOSPHORUS 3.7 03/14/2021     Hepatic Function  Lab Results   Component Value Date    ALBUMIN 4.4 12/29/2021    TOTPR 8.4 (H) 12/29/2021    BILID <0.2 12/29/2021    BILIT 0.3 12/29/2021    SGPTALT 6 12/29/2021    SGOTAST 20 12/29/2021    ALKPHOS 62 12/29/2021    LIPASE 33 12/29/2021     Coagulation  No results found for: "PT", "INR", "APTT"    Imaging:     12/13 CT AP  Interval increase in size of homogeneous hypodense pancreatic head cystic lesion 4.8 cm (previously 2.7 cm on 11/25/2021), the lesion has been present since 05/29/2020, presumably pseudocyst.   -Perhaps minimal increase peripancreatic stranding suggesting early recurrent pancreatitis.     Dallie Piles, DO, RES  [facility], PGY-3  12/29/2021, 7:51 PM   Electronically signed by Barbaraann Barthel, MD at 12/30/2021 12:53 PM EST

## 2021-12-29 NOTE — ED Provider Notes (Signed)
Formatting of this note is different from the original.    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  Patient left AMA.  -Epigastric abdominal tenderness to palpation without guarding or rigidity on exam  - Labs appear slightly improved from yesterday.  CBC with WBC of 6.3 and Hgb 10  - UA neg.  - Neg serum preg 12/28/21  - Lipase decreased to 33 from 71 yesterday  - Pt re-evaluated and complains of continued abd pain w/ n/v  - Admitted to [place] for further evaluation and management      Documentation/Prior Results Review:  Old medical records, Nursing notes, Previous radiology studies  Reviewed CT A/P from 12/28/2021 with expanding pancreatic pseudocyst size and pancreatic stranding representing possible early pancreatitis.    Rhythm interpretation from monitor: N/A    Imaging Interpreted by me: Not Applicable    EKG 76-HYWV    (Results Pending)     .     Discussion of Management with other Physicians, QHP or Appropriate Source:   Admitting team [place]    .    Disposition:  Admit    New Prescriptions    No medications on file     Chief Complaint   Patient presents with    ABDOMINAL PAIN     HPI   31 year old female with history of alcohol induced pancreatitis and pancreatic pseudocyst presents for  abdominal pain with nausea and vomiting.  Patient states she is unable to tolerate p.o. at home.  Was seen for similar yesterday and left AMA.  Patient states she felt better after the medications in the ER and went home and fell asleep.  But when she woke up she had recurrence of her symptoms.    Review of Systems:  Constitutional:  Negative for fever and chills.   HENT:  Negative for neck pain.    Respiratory:  Negative for cough and shortness of breath.    Cardiovascular:  Negative for chest pain.   Gastrointestinal:  Positive for nausea, vomiting and abdominal pain.   Genitourinary:  Negative for dysuria.   Musculoskeletal:  Negative for back pain.   Skin:  Negative for color change.   All other systems reviewed and are negative.    Physical Exam  Vitals and nursing note reviewed.   Constitutional:       General: She is not in acute distress.     Appearance: She is well-developed. She is obese. She is not ill-appearing, toxic-appearing or diaphoretic.   HENT:      Head: Normocephalic and atraumatic.      Nose: Nose normal.   Eyes:      Conjunctiva/sclera: Conjunctivae normal.   Cardiovascular:      Rate and Rhythm: Normal rate and regular rhythm.      Pulses:  Normal pulses.      Heart sounds: Normal heart sounds. No murmur heard.     No friction rub. No gallop.   Pulmonary:      Effort: Pulmonary effort is normal. No respiratory distress.      Breath sounds: Normal breath sounds. No stridor. No wheezing, rhonchi or rales.   Chest:      Chest wall: No tenderness.   Abdominal:      General: There is no distension.      Palpations: Abdomen is soft. There is no mass.      Tenderness: There is abdominal tenderness (epigastric). There is no right CVA tenderness, left CVA tenderness, guarding or rebound.      Hernia: No hernia is present.   Musculoskeletal:         General: No tenderness. Normal range of motion.      Cervical back: Normal range of motion and neck supple.   Skin:     General: Skin is dry.    Neurological:      Mental Status: She is alert and oriented to person, place, and time.   Psychiatric:         Mood and Affect: Mood normal.     Past Medical History:   Diagnosis Date    Alcohol-induced acute pancreatitis     no longer drinks EtOH regularly    Class 1 obesity with body mass index (BMI) of 32.0 to 32.9 in adult     Gallstones     per pt report     Past Surgical History:   Procedure Laterality Date    LAP CHOLECYSTECTOMY N/A 02/10/2021    Procedure: CHOLECYSTECTOMY, LAPAROSCOPIC, WITH CHOLANGIOGRAM;  Surgeon: Wynelle Fanny, MD     Family History   Problem Relation Age of Onset    No Known Problems Mother     Other Family History Father         died from trauma     Social History     Occupational History    Not on file   Tobacco Use    Smoking status: Every Day     Types: Cigarettes    Smokeless tobacco: Never   Substance and Sexual Activity    Alcohol use: Not on file    Drug use: Not on file    Sexual activity: Not on file     No outpatient medications have been marked as taking for the 12/29/21 encounter [facility] Encounter).     Allergies   Allergen Reactions    Droperidol neurological reaction     Pt states "face twitching and couldn't stop legs from moving"     Vital Signs:  Patient Vitals for the past 72 hrs:   Temp Heart Rate Pulse Resp BP BP Mean SpO2 Weight   12/29/21 1823 -- -- 60 16 139/95 (!) 113 MM HG 100 % --   12/29/21 1544 -- 71 -- 16 148/92 (!) 111 MM HG 100 % --   12/29/21 1329 97.4 F (36.3 C) 82 -- 18 170/78 (!) 109 MM HG 98 % 95.7 kg (210 lb 14.4 oz)     Diagnostics:  Labs:    Results for orders placed or performed during the hospital encounter of 12/29/21   Lipase   Result Value Ref Range    Lipase 33 7 - 60 U/L   HEPATIC FUNCTION PANEL   Result Value Ref Range    Albumin 4.4 3.5 - 5.0 g/dL    Total Protein  8.4 (H) 6.4 - 8.3 g/dL    Globulin 4.0 2.0 - 4.0 g/dL    A/G Ratio 1.1 1.1 - 2.6 ratio    Bilirubin Total 0.3 0.2 - 1.2 mg/dL    Bilirubin Direct <0.2 0.0 - 0.3 mg/dL     SGOT (AST) 20 10 - 37 U/L    Alkaline Phosphatase 62 25 - 115 U/L    SGPT (ALT) 6 5 - 40 U/L   BASIC METABOLIC PANEL   Result Value Ref Range    Potassium 4.1 3.5 - 5.5 mmol/L    Sodium 138 133 - 145 mmol/L    Chloride 103 98 - 110 mmol/L    Glucose 89 70 - 99 mg/dL    Calcium 8.9 8.4 - 10.5 mg/dL    BUN 4 (L) 6 - 22 mg/dL    Creatinine 0.5 0.5 - 1.2 mg/dL    CO2 22 20 - 32 mmol/L    eGFR >60.0 >60.0 mL/min/1.73 sq.m.    Anion Gap 13.0 3.0 - 15.0 mmol/L   CBC WITH DIFFERENTIAL AUTO   Result Value Ref Range    WBC 6.3 4.0 - 11.0 K/uL    RBC 4.04 3.80 - 5.20 M/uL    HGB 10.0 (L) 11.7 - 15.5 g/dL    HCT 29.2 (L) 35.1 - 46.5 %    MCV 72 (L) 80 - 99 fL    MCH 25 (L) 26 - 34 pg    MCHC 34 31 - 36 g/dL    RDW 18.2 (H) 10.0 - 15.5 %    Platelet 313 140 - 440 K/uL    MPV 10.2 9.0 - 13.0 fL    Segmented Neutrophils (Auto) 63 40 - 75 %    Lymphocytes (Auto) 28 20 - 45 %    Monocytes (Auto) 7 3 - 12 %    Eosinophils (Auto) 2 0 - 6 %    Basophils (Auto) 1 0 - 2 %    Absolute Neutrophils (Auto) 4.0 1.8 - 7.7 K/uL    Absolute Lymphocytes (Auto) 1.8 1.0 - 4.8 K/uL    Absolute Monocytes (Auto) 0.5 0.1 - 1.0 K/uL    Absolute Eosinophils (Auto) 0.1 0.0 - 0.5 K/uL    Absolute Basophils (Auto) 0.0 0.0 - 0.2 K/uL   Urinalysis   Result Value Ref Range    Urine Color Yellow Colorless, Pale Yellow, Light Yellow, Yellow, Dark Yellow, Straw    Urine Clarity Cloudy (A) Clear, Slightly Cloudy    Urine pH 6.5 5.0 - 8.0 pH    Urine Protein Screen 30* (A) Negative, Trace mg/dL    Urine Glucose Negative Negative mg/dL    Urine Ketones Negative Negative mg/dL    Urine Occult Blood Negative Negative    Urine Specific Gravity 1.017 1.005 - 1.030    Urine Nitrite Negative Negative    Urine Leukocyte Esterase Negative Negative    Urine Bilirubin Negative Negative    Urine Urobilinogen 1.0 <2.0 mg/dL mg/dL    Urine RBC 0-2 Negative, 0-2 /hpf    Urine WBC 6-10 (A) 0 - 5 /hpf    Urine Bacteria Present (A) Negative    Squamous Epithelial Cells 11-20 (A)  None, 0-2 /hpf    Hyaline Cast 0-2 0 - 2 /lpf     ECG:  No results found for this visit on 12/29/21.    Medications ordered/given in the ED  Medications   Lactated Ringers (LR) infusion (2,000 mL Intravenous New Bag 12/29/21 1536)  heparin injection 5,000 Units (has no administration in time range)   NS Flush injection 10 mL (has no administration in time range)   morphine injection 4 mg (4 mg Intravenous Given 12/29/21 1539)   ondansetron (PF) (Zofran) injection 4 mg (4 mg Intravenous Given 12/29/21 1538)   morphine injection 4 mg (4 mg Intravenous Given 12/29/21 1824)       Electronically signed by Wynelle Fanny, MD at 12/29/2021  7:36 PM EST

## 2021-12-29 NOTE — ED Notes (Signed)
Formatting of this note is different from the original.     12/29/21 1329   Vital Signs   Temp 97.4 F (36.3 C)   Temp Source Tympanic   Heart Rate 82   Resp 18   BP 170/78   BP Mean (!) 109 MM HG   SpO2 98 %   Room Air Yes   Device (Oxygen Therapy) room air   Height 5\' 5"  (1.651 m)   Weight 95.7 kg (210 lb 14.4 oz)   BMI (Calculated) 35.1       Electronically signed by Germaine Pomfret E at 12/29/2021  1:31 PM EST

## 2021-12-29 NOTE — ED Triage Notes (Signed)
Formatting of this note might be different from the original.  Pt here for abdominal pain, vomiting since yesterday. History of pancreatitis.  Electronically signed by Jackolyn Confer, RN at 12/29/2021  1:32 PM EST

## 2021-12-30 NOTE — Progress Notes (Signed)
Formatting of this note might be different from the original.  Images from the original note were not included.      Nursing Admission Screen     Tomorrow Dehaas is a 31 y.o. admitted for Pancreatitis [K85.90].   Admission type: ER.    Information for this history was provided by patient    The following language barriers exist:none . Ms. Tarver's primary language is [place]. The family speaks [place]. Services needed include: none . An interpreter is not required  to facilitate communication with Ms. Rubis, her family and other support systems.     Ms. Levay was oriented to: BR/ER light , bed controls , TV/radio , patient education channel , telephone , bathroom , visiting hours , overnight stay , and call light .    Electronically signed by Dion Saucier, LPN at 75/64/3329  5:18 AM EST

## 2021-12-30 NOTE — Progress Notes (Signed)
Formatting of this note might be different from the original.  EKG ordered on patient. Pt. currently in hallway surg stretcher (extra bed). Provider aware EKG will be completed when she is in her own room for privacy reasons. MLyford RN  Electronically signed by Gilles Chiquito, RN at 12/30/2021 12:51 PM EST

## 2021-12-30 NOTE — Progress Notes (Signed)
Formatting of this note is different from the original.  Images from the original note were not included.      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 Her episodes of pancreatitis have been occurring more frequently and her last episodes was 3-4 weeks ago. She is willing to start eating today.     # Acute on chronic pancreatitis  - pain in epigastric region with radiation to the back; similar to previous episodes  - mildly elevated Lipase  - afebrile, no white count  - CT AP shows slightly enlarged pseudocyst, walled off necrosis  - episodes associated sometimes with fatty foods or alcohol  []  LR 100cc/h  []  IV PPI  []  PRN Tylenol 500 q6h,   []  PRN IV Dilaudid 0.5-1mg  q4h  []  IV zofran  []  Needs EKG for QTc prolonging meds    # Hx heavy alcohol use  - has cut down to 1 glass of wine 1-2 days per week, last drink was 2 days ago, denies Hx withdrawal Sx  []  thiamine, MV, folate    # Anemia  []  check iron panel, B12 tmrw    # Smoking  - cigarettes sometimes and cannabis twice daily  - declined nicotine patch    # Reported Hx mild asthma  - no PFTs on file  - occasional albuterol  - recommend quitting smoking    Global Issues  DVT Prophylaxis: SQH  Diet: Clear liquids advance as tolerated  Code Status: Full  PCP: None No PCP PCP, MD    Further management for Uvaldo Rising will be discussed on rounds with my attending    Subjective:     Events of the last 24 hours   Admitted  yesterday for pancreatitis  States she feels like she can tolerate PO this morning    Review of Systems:      Review of Systems   Constitutional:  Negative for chills, fever, malaise/fatigue and weight loss.   Eyes:  Negative for photophobia and pain.   Respiratory:  Negative for cough and hemoptysis.    Cardiovascular:  Negative for chest pain and palpitations.   Gastrointestinal:  Negative for abdominal pain, constipation, diarrhea, heartburn, nausea and vomiting.   Genitourinary:  Negative for dysuria.   Musculoskeletal:  Negative for myalgias.   Skin:  Negative for rash.   Neurological:  Negative for dizziness, seizures, weakness and headaches.   Psychiatric/Behavioral:  Negative for depression and memory loss.      Objective:      BP 111/64   Pulse 60   Temp 98.1 F (36.7 C)   Resp 18   Ht 5\' 5"  (1.651 m)   Wt 95.3 kg (210 lb 1.6 oz)   SpO2 94%   BMI 34.96 kg/m      Temp (24hrs), Avg:97.7 F (36.5 C), Min:97.1  F (36.2 C), Max:98.3 F (36.8 C)      Intake/Output Summary (Last 24 hours) at 12/30/2021 1408  Last data filed at 12/30/2021 1246  Gross per 24 hour   Intake 2682.23 ml   Output --   Net 2682.23 ml     Net IO Since Admission: 2,682.23 mL [12/30/21 1408]     Physical Exam  Constitutional:       Appearance: She is normal weight.   HENT:      Mouth/Throat:      Mouth: Mucous membranes are moist.   Eyes:      Pupils: Pupils are equal, round, and reactive to light.   Cardiovascular:      Rate and Rhythm: Normal rate and regular rhythm.      Pulses: Normal pulses.      Heart sounds: Normal heart sounds. No murmur heard.     No gallop.   Pulmonary:      Effort: Pulmonary effort is normal.      Breath sounds: Normal breath sounds.   Abdominal:      General: Abdomen is flat. There is no distension.      Palpations: Abdomen is soft. There is no mass.      Tenderness: There is no abdominal tenderness.   Musculoskeletal:         General: No swelling or tenderness.   Skin:     General: Skin is warm and  dry.   Neurological:      Mental Status: She is alert and oriented to person, place, and time.      Cranial Nerves: No cranial nerve deficit.   Psychiatric:         Mood and Affect: Mood normal.     Current Inpatient Meds   heparin, 5,000 Units, Q8H  pantoprazole (Protonix) 40 mg in sodium chloride 0.9 % 10 mL syringe, 40 mg, Daily  therapeutic multivitamin-minerals, 1 Tab, Daily  thiamine, 100 mg, Daily        Laboratory Results     CBC    Recent Labs     12/30/21  0757 12/29/21  1522 12/28/21  0928   WBC 5.0 6.3 8.0   HEMOGLOBIN 8.1* 10.0* 9.6*   HCT 23.1* 29.2* 28.2*   MCV 71* 72* 72*   MCH 25* 25* 25*   MCHC 35 34 34   RDW 18.2* 18.2* 18.5*   PLATELET 211 313 318   MPV 9.4 10.2 10.0   SEGS 42 63 65   LYMPHOCYTES 45 28 25   MONOS 10 7 9    EOS 2 2 1    BASOS 0 1 0     Basic Metabolic Profile   Recent Labs     12/30/21  0757 12/29/21  1522 12/28/21  0928   NA 133 138 139   POTASSIUM 3.5 4.1 3.9   CHLORIDE 100 103 104   CO2 27 22 21    BUN 6 4* 6   CREAT 0.4* 0.5 0.5   GLUCOSE 91 89 109*   CALCIUM 8.7 8.9 9.2   MAGNESIUM 1.7  --   --    PHOSPHORUS 4.6  --   --        Imaging     EKG 12-LEAD    (Results Pending)   EKG 12 LEAD UNIT PERFORMED    (Results Pending)     12/31/21. 12/30/21, MD  [facility] Internal Medicine PGY-2  Pager: [phone]      Electronically signed by Madilyn Fireman  A, MD at 12/31/2021  1:51 PM EST

## 2021-12-30 NOTE — Progress Notes (Signed)
Formatting of this note is different from the original.  Date: 12/30/2021  Time: 7:19 PM    Scan Patient Arm Band: [id number]    Primary Dx: Pancreatitis [K85.90]  Patient LOS: 1  Expected Discharge Date: Jan 01, 2022      Plan of Care:    IV Drips:   LR, Last Rate: 100 mL/hr (12/30/21 0800)    All drips have been reviewed    CVL / PIV Lines:     Patient Lines/Drains/Airways Status       Active Peripheral Venous Line / Central Venous Line / Arterial Line / Left Atrial Line / Epidural Line / Airway / Subcutaneous Line / Drain / PIV Line / Intraosseous Line       Name Placement date Placement time Site Days Last dressing change    PIV: 12/29/21 1536 20 gauge Forearm Anterior;Left;Proximal 12/29/21  1536  Forearm  1                Dressing Dates , CHG Bath needs and patient presentation has been reviewed.    ACTIVE Foley Orders :     Foley 8-step care has been reviewed    Foley INDICATION has been reviewed     ACTIVE TELE order     Active Telemetry Orders (From admission, onward)      None         Tele Orders have been confirmed and reviewed.     ACTIVE Wounds     Patient Lines/Drains/Airways Status       Active Wound / Wound Vac Therapy / Rash / Burns       Name Placement date Placement time Site Days Additional Info Last dressing change    Incision: Abdomen 02/10/21 1051 02/10/21  1051  Abdomen (Comment) Trocar Sites X 4  323 Pre-existing: No                 Wound care Orders have been confirmed and reviewed.   LDA & Skin-man have been updated and reviewed for accuracy    Labs     Recent Labs     12/30/21  0757 12/29/21  1522 12/28/21  0928   NA 133 138 139   POTASSIUM 3.5 4.1 3.9   CREAT 0.4* 0.5 0.5   GLUCOSE 91 89 109*   CALCIUM 8.7 8.9 9.2   MAGNESIUM 1.7  --   --    PHOSPHORUS 4.6  --   --      H/H:   Lab Results   Component Value Date    HEMOGLOBIN 8.1 (L) 12/30/2021    HCT 23.1 (L) 12/30/2021     CPK: No results found for: "CPK"  Labs and Electrolyte Replacement protocols have been confirmed and  reviewed.     Restraints     Restraint Orders have been reviewed for accuracy.     Communication Whiteboard updated, Bedside shift Report Completed. The Above orders and Lab results have been reviewed with off going nurse by Earley Brooke, RN          Electronically signed by Earley Brooke, RN at 12/30/2021  8:48 PM EST

## 2021-12-30 NOTE — ED Notes (Signed)
Formatting of this note might be different from the original.  Emergency Department Admission Handoff Note    ED Nurse Phone Number [phone]    Transported by ED Tech.    Safety Partner needed No.    Patient was transported via wheelchair to room [place] on IV pump.    Psych Risk  Have you had any thoughts of harming yourself or others?: No      PAWSS Score  Total Score: 1    Belongings   With patient      Electronically signed by Napoleon Form, RN at 12/30/2021  2:37 AM EST

## 2021-12-30 NOTE — Progress Notes (Signed)
Formatting of this note is different from the original.  Images from the original note were not included.    [facility]  Attending Attestation    Assessment and Plan:     The team and I have reviewed the labwork of the last 24 hours as well as the ancillary studies as noted below.I personally performed a substantive portion of the care of this patient to include the history in entirety, the physical exam in entirety, and the medical decision making and formulation of the assessment and plan. I agree with the findings except as I have noted.    Please see resident physician note for further review.     Active Hospital Problems    Diagnosis     Alcohol abuse     Pancreatic pseudocyst     Nausea and vomiting     Pancreatic pseudocyst/cyst     Pancreatitis     Alcohol-induced acute pancreatitis     Dehydration      Pseudocyst/cyst has increased in size, will touch base with gastroenterology for guidance given increased frequency of pancreatitis. CIWA. Pain control, fluids. Advance diet as tolerated.     Appreciate consulting services recommendations and expertise.     Barbaraann Barthel, DO, MS, MBA  Assistant Professor of [employer]       Electronically signed by Barbaraann Barthel, MD at 12/30/2021 12:54 PM EST

## 2021-12-31 NOTE — Care Plan (Signed)
Formatting of this note might be different from the original.    Problem: Adult Inpatient Plan of Care  Goal: Plan of Care Review  Outcome: Progressing  Goal: Patient-Specific Goal (Individualized)  Outcome: Progressing  Goal: Absence of Hospital-Acquired Illness or Injury  Outcome: Progressing  Intervention: Identify and Manage Fall Risk  Flowsheets (Taken 12/30/2021 2042)  Safety Promotion/Fall Prevention:   nonskid shoes/slippers when out of bed   safety round/check completed  Intervention: Prevent Skin Injury  Flowsheets  Taken 12/30/2021 2042 by Earley Brooke, RN  Body Position: position changed independently  Taken 12/30/2021 1952 by Delphia Grates  Skin Protection: adhesive use limited  Intervention: Prevent and Manage VTE (Venous Thromboembolism) Risk  Flowsheets (Taken 12/30/2021 1952 by Delphia Grates)  VTE Prevention/Management: SCDs (sequential compression devices) off  Intervention: Prevent Infection  Flowsheets (Taken 12/30/2021 2042)  Infection Prevention: rest/sleep promoted  Goal: Optimal Comfort and Wellbeing  Outcome: Progressing  Intervention: Monitor Pain and Promote Comfort  Flowsheets (Taken 12/31/2021 0119)  Pain Management Interventions:   care clustered   pain medication given   pain management plan reviewed with patient/caregiver  Intervention: [place] (Taken 12/30/2021 2042)  Trust Relationship/Rapport:   care explained   choices provided   thoughts/feelings acknowledged   reassurance provided   questions encouraged   questions answered  Goal: Readiness for Transition of Care  Outcome: Progressing    Problem: Fall Prevention  Goal: Prevent/Manage Accidental Injury (Falls)  Description: Consider requesting a pharmacologic review as needed.  Consider asking the MD to consult PT / OT for an evaluation.  Outcome: Progressing    Electronically signed by Earley Brooke, RN at 12/31/2021  2:26 AM EST

## 2021-12-31 NOTE — Progress Notes (Signed)
Formatting of this note is different from the original.  Date: 12/31/2021  Time: 7:21 PM    Scan Patient Arm Band: [id number]    Primary Dx: Pancreatitis [K85.90]  Patient LOS: 2  Expected Discharge Date: Jan 01, 2022      Plan of Care:    IV Drips:   LR, Last Rate: 100 mL/hr (12/31/21 0526)    All drips have been reviewed    CVL / PIV Lines:     Patient Lines/Drains/Airways Status       Active Peripheral Venous Line / Central Venous Line / Arterial Line / Left Atrial Line / Epidural Line / Airway / Subcutaneous Line / Drain / PIV Line / Intraosseous Line       Name Placement date Placement time Site Days Last dressing change    PIV: 12/29/21 1536 20 gauge Forearm Anterior;Left;Proximal 12/29/21  1536  Forearm  2     PIV: 12/31/21 1531 22 gauge Forearm Anterior;Proximal;Right 12/31/21  1531  Forearm  less than 1                Dressing Dates , CHG Bath needs and patient presentation has been reviewed.    ACTIVE Foley Orders :     Foley 8-step care has been reviewed    Foley INDICATION has been reviewed     ACTIVE TELE order     Active Telemetry Orders (From admission, onward)      None         Tele Orders have been confirmed and reviewed.     ACTIVE Wounds     Patient Lines/Drains/Airways Status       Active Wound / Wound Vac Therapy / Rash / Burns       Name Placement date Placement time Site Days Additional Info Last dressing change    Incision: Abdomen 02/10/21 1051 02/10/21  1051  Abdomen (Comment) Trocar Sites X 4  324 Pre-existing: No                 Wound care Orders have been confirmed and reviewed.   LDA & Skin-man have been updated and reviewed for accuracy    Labs     Recent Labs     12/31/21  0127 12/30/21  0757 12/29/21  1522   NA 133 133 138   POTASSIUM 3.7 3.5 4.1   CREAT 0.4* 0.4* 0.5   GLUCOSE 91 91 89   CALCIUM 8.4 8.7 8.9   MAGNESIUM 1.7 1.7  --    PHOSPHORUS 4.4 4.6  --      H/H:   Lab Results   Component Value Date    HEMOGLOBIN 7.9 (L) 12/31/2021    HCT 22.8 (L) 12/31/2021     CPK: No  results found for: "CPK"  Labs and Electrolyte Replacement protocols have been confirmed and reviewed.     Restraints     Restraint Orders have been reviewed for accuracy.     Communication Whiteboard updated, Bedside shift Report Completed. The Above orders and Lab results have been reviewed with off going nurse by Earley Brooke, RN          Electronically signed by Earley Brooke, RN at 12/31/2021  7:22 PM EST

## 2021-12-31 NOTE — Progress Notes (Signed)
Formatting of this note is different from the original.  Images from the original note were not included.      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 female with a past medical history of chronic alcoholic pancreatitis who was admitted for acute on chronic alcoholic pancreatitis.    Plan for today:  - GI consult  - LR @ 100cc/hr  - IV PPI BID  - Regular diet  - IV Venofer    Acute on chronic alcoholic pancreatitis c/b walled-off necrosis, improving  - CT abd/pelvis showing an interval increase in size of homogenous hypodense pancreatic head cystic lesion now 4.8cm representing walled-off necrosis  - afebrile without leukocytosis - likely not infected and no need for antibiotics at this time  []  GI consult for likely endoscopic drainage of walled-off necrosis  []  LR @ 100cc/hr  []  IV PPI BID  []  PRN Tylenol, IV Dilaudid 0.5-1mg  q4h for pain  []  IV Zofran for nausea  []  Regular diet    Iron deficiency anemia, stable  - [place] iron deficit 2.2g  []  IV Venofer 300mg  x3 doses  []  Transition to iron tabs every other day for discharge    Heavy alcohol use  - no hx of withdrawals  []  CIWA with PRN Ativan  []  MV, thiamine, folate    Barriers to discharge: GI consult, resolution of pancreatitis    Global Issues  DVT Prophylaxis: SQH  Diet: Regular  Code Status: Full  Physical Therapy: Ordered  Occupational Therapy: Ordered  PCP: None No PCP PCP, MD    Further management for Rhonda Hammond will be discussed on rounds with my attending.    Subjective:     Events of the last 24 hours:  No acute  events overnight. She states that her pain is improved rated as 7/10. Eating well, but limited by not enjoying the hospital food. Denies nausea and vomiting.    Review of Systems:      Positives are bolded.    Const: no fevers/chills  CV: no chest pain  Pulm: no Shortness of Breath  GI: no n/v, (+) abdominal pain  Neuro: no focal weakness/numbness/tingling    Current Inpatient Meds:     folic acid, 1 mg, Daily  heparin, 5,000 Units, Q8H  iron sucrose, 300 mg, Daily  pantoprazole (Protonix) 40 mg in sodium chloride 0.9 % 10 mL syringe, 40 mg, BID  therapeutic multivitamin-minerals, 1 Tab, Daily  thiamine, 100 mg, Daily      LR, Last Rate: 100 mL/hr (12/31/21 0526)    Objective:      Temp (24hrs), Avg:98.2 F (36.8 C), Min:97.8 F (36.6 C), Max:98.6 F (37 C)    BP 130/68   Pulse 65   Temp 98.3 F (36.8 C)   Resp 16   Ht 5\' 5"  (1.651 m)   Wt 100 kg (220 lb 7.4 oz)   SpO2 96%   BMI 36.69 kg/m  Patient Vitals for the past 24 hrs:   Temp Heart Rate Pulse Resp BP BP Mean SpO2 Weight   12/31/21 1236 98.3 F (36.8 C) 65 65 16 130/68 89 MM HG 96 % --   12/31/21 0717 98.6 F (37 C) 62 62 16 134/72 95 MM HG 96 % --   12/31/21 0407 98 F (36.7 C) -- 62 18 129/64 87 MM HG 96 % 100 kg (220 lb 7.4 oz)   12/30/21 1952 97.8 F (36.6 C) -- 56 18 117/68 86 MM HG 98 % --     Intake/Output Summary (Last 24 hours) at 12/31/2021 1412  Last data filed at 12/31/2021 0700  Gross per 24 hour   Intake 0 ml   Output 500 ml   Net -500 ml     Positives are bolded.    Gen: Alert. NAD.  CV: RRR. Normal S1/S2. No m/r/g.  Pulm: CTAB. No crackles/wheezes/rhonchi. No accessory muscle use.  Abdomen: Soft, ND. Normal BS. Epigastric tenderness to palpation.  Ext: No c/c/e.  Neuro: A/A/O x4.  Skin: No petechiae. No rash. Warm/dry/intact.  Psych: Appropriate mood and affect.    Labwork and Ancillary Studies:     Labs/Studies reviewed:    Most Recent CBC:  Lab Results   Component Value Date/Time    WBC 5.0 12/31/2021 01:27 AM    RBC 3.19  (L) 12/31/2021 01:27 AM    HEMOGLOBIN 7.9 (L) 12/31/2021 01:27 AM    HCT 22.8 (L) 12/31/2021 01:27 AM    MCV 72 (L) 12/31/2021 01:27 AM    MCH 25 (L) 12/31/2021 01:27 AM    MCHC 35 12/31/2021 01:27 AM    RDW 18.0 (H) 12/31/2021 01:27 AM    PLATELET 223 12/31/2021 01:27 AM    MPV 10.5 12/31/2021 01:27 AM     Most Recent BMP:  Lab Results   Component Value Date    NA 133 12/31/2021    POTASSIUM 3.7 12/31/2021    CHLORIDE 100 12/31/2021    CO2 24 12/31/2021    BUN 7 12/31/2021    CREAT 0.4 (L) 12/31/2021    GLUCOSE 91 12/31/2021    CALCIUM 8.4 12/31/2021    MAGNESIUM 1.7 12/31/2021    PHOSPHORUS 4.4 12/31/2021     Most Recent Liver Function:  Lab Results   Component Value Date/Time    SGOTAST 20 12/29/2021 03:22 PM    SGPTALT 6 12/29/2021 03:22 PM    ALKPHOS 62 12/29/2021 03:22 PM    BILIT 0.3 12/29/2021 03:22 PM    BILID <0.2 12/29/2021 03:22 PM    TOTPR 8.4 (H) 12/29/2021 03:22 PM    ALBUMIN 4.4 12/29/2021 03:22 PM     Most Recent Coagulation:  No results found for: "APTT", "PT", "INR"   Most Recent Lipid Panel:  Lab Results   Component Value Date/Time    TRIGLYCERIDE 48 12/30/2021 07:57 AM     Most Recent HbA1C/Microalbuminuria:  Lab Results   Component Value Date/Time    CREATUMGDL 63 05/26/2021 02:35 PM     Most Recent HIV/Hep C:  No results found for: "HIVABAG", "HIVINTERP", "HEPCAB"    Imaging/Studies    CT ABD/PELVIS-IV ONLY 01/28/23  Impression    Addendum:    The pancreatic head collection appears to be surrounded with pancreatic tissue and therefore representing walled-off necrosis.    Interval increase in size of homogeneous hypodense pancreatic head cystic lesion 4.8 cm (previously 2.7 cm on 11/25/2021), the lesion has been present since 05/29/2020, presumably pseudocyst.  -  Perhaps minimal increase peripancreatic stranding suggesting early recurrent pancreatitis.    [place] J Tyrrell, MD,RES  [facility] Internal Medicine PGY-1  Available Via Epic Chat  12/31/2021, 2:12 PM    Electronically signed by Retta Mac,  MD at 01/01/2022 12:30 PM EST

## 2021-12-31 NOTE — Care Plan (Signed)
Formatting of this note might be different from the original.    Problem: Adult Inpatient Plan of Care  Goal: Absence of Hospital-Acquired Illness or Injury  Outcome: Progressing  Intervention: Identify and Manage Fall Risk  Flowsheets (Taken 12/31/2021 1002)  Safety Promotion/Fall Prevention:   nonskid shoes/slippers when out of bed   safety round/check completed  Intervention: Prevent Skin Injury  Flowsheets (Taken 12/31/2021 1002)  Body Position: position changed independently  Skin Protection: adhesive use limited  Intervention: Prevent and Manage VTE (Venous Thromboembolism) Risk  Flowsheets (Taken 12/31/2021 1002)  VTE Prevention/Management: SCDs (sequential compression devices) off  Intervention: Prevent Infection  Flowsheets (Taken 12/31/2021 1002)  Infection Prevention: rest/sleep promoted  Goal: Optimal Comfort and Wellbeing  Outcome: Progressing  Intervention: Monitor Pain and Promote Comfort  Flowsheets (Taken 12/31/2021 1002)  Pain Management Interventions:   care clustered   diversional activity provided   pillow support provided   pain medication given   pain management plan reviewed with patient/caregiver   quiet environment facilitated   relaxation techniques promoted  Intervention: [place] (Taken 12/31/2021 1002)  Trust Relationship/Rapport:   care explained   thoughts/feelings acknowledged   questions encouraged    Electronically signed by Roxanne Gates, LPN at 30/86/5784 69:62 AM EST

## 2021-12-31 NOTE — Case Communication (Signed)
Formatting of this note might be different from the original.  Anticipated discharge date:  01/01/2022  Planned disposition:  Per MDR, the plan is home  Current LOS: 2 days    Clinical status: pain control    Case Management assessment is needed for dc planning. No PT/OT orders.     Joya Gaskins, MSW, LCSW-A, CCM  Inpatient Case Manager          Electronically signed by Joya Gaskins, MSW at 12/31/2021  8:26 AM EST

## 2021-12-31 NOTE — Care Plan (Signed)
Formatting of this note might be different from the original.    Problem: Adult Inpatient Plan of Care  Goal: Plan of Care Review  Outcome: Progressing  Goal: Patient-Specific Goal (Individualized)  Outcome: Progressing  Goal: Absence of Hospital-Acquired Illness or Injury  Outcome: Progressing  Intervention: Identify and Manage Fall Risk  Flowsheets (Taken 12/31/2021 2128)  Safety Promotion/Fall Prevention:   nonskid shoes/slippers when out of bed   safety round/check completed  Intervention: Prevent Skin Injury  Flowsheets  Taken 12/31/2021 2128 by Earley Brooke, RN  Body Position: position changed independently  Taken 12/31/2021 1002 by Roxanne Gates, LPN  Skin Protection: adhesive use limited  Intervention: Prevent and Manage VTE (Venous Thromboembolism) Risk  Flowsheets (Taken 12/31/2021 1848 by Roxanne Gates, LPN)  VTE Prevention/Management: SCDs (sequential compression devices) off  Intervention: Prevent Infection  Flowsheets (Taken 12/31/2021 2128)  Infection Prevention: rest/sleep promoted  Goal: Optimal Comfort and Wellbeing  Outcome: Progressing  Intervention: Monitor Pain and Promote Comfort  Flowsheets (Taken 12/31/2021 2128)  Pain Management Interventions:   care clustered   pain medication given   pain management plan reviewed with patient/caregiver  Intervention: [place] (Taken 12/31/2021 2128)  Trust Relationship/Rapport:   care explained   choices provided   thoughts/feelings acknowledged   reassurance provided   questions encouraged   questions answered  Goal: Readiness for Transition of Care  Outcome: Progressing    Problem: Fall Prevention  Goal: Prevent/Manage Accidental Injury (Falls)  Description: Consider requesting a pharmacologic review as needed.  Consider asking the MD to consult PT / OT for an evaluation.  Outcome: Progressing    Electronically signed by Earley Brooke, RN at 12/31/2021 11:55 PM EST

## 2021-12-31 NOTE — Progress Notes (Signed)
Formatting of this note is different from the original.  Images from the original note were not included.  Date: 12/31/2021  Time: 7:18 AM    Scan Patient Arm Band: [id number]    Primary Dx: Pancreatitis [K85.90]  Patient LOS: 2  Expected Discharge Date: Jan 01, 2022      Plan of Care:    IV Drips:   LR, Last Rate: 100 mL/hr (12/31/21 0526)    All drips have been reviewed    CVL / PIV Lines:     Patient Lines/Drains/Airways Status       Active Peripheral Venous Line / Central Venous Line / Arterial Line / Left Atrial Line / Epidural Line / Airway / Subcutaneous Line / Drain / PIV Line / Intraosseous Line       Name Placement date Placement time Site Days Last dressing change    PIV: 12/29/21 1536 20 gauge Forearm Anterior;Left;Proximal 12/29/21  1536  Forearm  1                Dressing Dates , CHG Bath needs and patient presentation has been reviewed.    ACTIVE Foley Orders :     Foley 8-step care has been reviewed    Foley INDICATION has been reviewed     ACTIVE TELE order     Active Telemetry Orders (From admission, onward)      None         Tele Orders have been confirmed and reviewed.     ACTIVE Wounds     Patient Lines/Drains/Airways Status       Active Wound / Wound Vac Therapy / Rash / Burns       Name Placement date Placement time Site Days Additional Info Last dressing change    Incision: Abdomen 02/10/21 1051 02/10/21  1051  Abdomen  Trocar Sites X 4  323 Pre-existing: No                 Wound care Orders have been confirmed and reviewed.   LDA & Skin-man have been updated and reviewed for accuracy    Labs     Recent Labs     12/31/21  0127 12/30/21  0757 12/29/21  1522   NA 133 133 138   POTASSIUM 3.7 3.5 4.1   CREAT 0.4* 0.4* 0.5   GLUCOSE 91 91 89   CALCIUM 8.4 8.7 8.9   MAGNESIUM 1.7 1.7  --    PHOSPHORUS 4.4 4.6  --      H/H:   Lab Results   Component Value Date    HEMOGLOBIN 7.9 (L) 12/31/2021    HCT 22.8 (L) 12/31/2021     CPK: No results found for: "CPK"  Labs and Electrolyte Replacement  protocols have been confirmed and reviewed.     Restraints     Restraint Orders have been reviewed for accuracy.     Communication Whiteboard updated, Bedside shift Report Completed. The Above orders and Lab results have been reviewed with off going nurse by Roxanne Gates, LPN          Electronically signed by Roxanne Gates, LPN at 14/78/2956  2:13 AM EST

## 2021-12-31 NOTE — Progress Notes (Signed)
Formatting of this note is different from the original.  Images from the original note were not included.    [facility]  Attending Attestation    Assessment and Plan:     The team and I have reviewed the labwork of the last 24 hours as well as the ancillary studies as noted below.I personally performed a substantive portion of the care of this patient to include the history in entirety, the physical exam in entirety, and the medical decision making and formulation of the assessment and plan. I agree with the findings except as I have noted.    Please see resident physician note for further review.     Active Hospital Problems    Diagnosis     Alcohol abuse     Pancreatic pseudocyst     Nausea and vomiting     Pancreatic pseudocyst/cyst     Pancreatitis     Alcohol-induced acute pancreatitis     Dehydration      Consulting GI today for eval of increased cystic/pseudocyst in the setting of walled off necrosis. Overall clinically improved. Advance diet as tolerated.     Appreciate consulting services recommendations and expertise.     Barbaraann Barthel, DO, MS, MBA  Assistant Professor of [employer]       Electronically signed by Barbaraann Barthel, MD at 12/31/2021  1:52 PM EST

## 2022-01-01 NOTE — Progress Notes (Signed)
Formatting of this note might be different from the original.  Physical Therapy Screen    Room:  I712/W580-99    Physical therapy evaluation is not recommended.  Received evaluation order.   Patient screened by PT and does not appear appropriate for skilled PT. Patient mobilizing at baseline. Confirmed with chart review and/or with clinical staff/patient/family. Pt states she has been ambulating in her room without issues and is at her physical baseline.  No acute needs, PT to s/o but remains available.    Renelda Mom PT, DPT  DEPT#: [phone]    Electronically signed by Renelda Mom, PT at 01/01/2022 10:14 AM EST

## 2022-01-01 NOTE — Progress Notes (Signed)
Formatting of this note might be different from the original.  Assumed care of pt at this time. Report received from Adrian, [place].  Electronically signed by Donovan Kail, RN at 01/01/2022 11:45 PM EST

## 2022-01-01 NOTE — Care Plan (Signed)
Formatting of this note might be different from the original.    Problem: Adult Inpatient Plan of Care  Goal: Absence of Hospital-Acquired Illness or Injury  Outcome: Progressing  Intervention: Identify and Manage Fall Risk  Flowsheets (Taken 01/01/2022 0928)  Safety Promotion/Fall Prevention:   nonskid shoes/slippers when out of bed   safety round/check completed  Intervention: Prevent Skin Injury  Flowsheets (Taken 01/01/2022 0928)  Body Position: position changed independently  Skin Protection: adhesive use limited  Intervention: Prevent and Manage VTE (Venous Thromboembolism) Risk  Flowsheets (Taken 01/01/2022 0928)  VTE Prevention/Management: SCDs (sequential compression devices) off  Intervention: Prevent Infection  Flowsheets (Taken 01/01/2022 0928)  Infection Prevention:   rest/sleep promoted   single patient room provided  Goal: Optimal Comfort and Wellbeing  Outcome: Progressing  Intervention: Monitor Pain and Promote Comfort  Flowsheets (Taken 01/01/2022 0928)  Pain Management Interventions:   care clustered   diversional activity provided   pillow support provided   position adjusted   quiet environment facilitated  Intervention: [place] (Taken 01/01/2022 0928)  Trust Relationship/Rapport:   care explained   thoughts/feelings acknowledged   questions answered   questions encouraged    Electronically signed by Roxanne Gates, LPN at 07/12/9483  4:62 AM EST

## 2022-01-01 NOTE — Progress Notes (Signed)
Formatting of this note might be different from the original.  Occupational Therapy Screen    Room:  E993/Z169-67    Occupational therapy evaluation is not recommended.  Received eval order.    Patient screened by OT and does not appear appropriate for skilled OT. Patient performing activities of daily living at baseline. Confirmed with chart review and/or with clinical staff/patient/family. (pt endorses no concerns related to ADLs/self-care skills. Reports current performance is baseline.)    Thank You,  Everlene Farrier OTR/L  Rehab Services [phone]      Electronically signed by Everlene Farrier, OT at 01/01/2022  8:35 AM EST

## 2022-01-01 NOTE — Consults (Signed)
Formatting of this note is different from the original.  Images from the original note were not included.          GI Consult Note    Referring provider -- Retta Mac, MD    Primary care - None No PCP PCP, MD  Admission Date -- 12/29/2021  Date of Consult - December 31, 2021  Reason for referral - pancreatitis,necrotizing    Impression:   Acute on chronic pancreatitis now with imaging showing pseudocyst with necrosis. Alcohol history.         Ongoing pain intermittently. Wbc 5 Lipase 33.   CT 12/28/2021 4.8 cm lesion pancreatic head with walled off necrosis. Increased in size compared to prior. S/p cholecystectomy. Splenic collection 4.8x4.1 cm.   2. Acute on chronic anemia 7.9 with microcytic indices. To receive iv iron. (Baseline around 9). Iron saturation 31% and ferritin 16.  3. HCG negative.   4. RAD    Cholecystectomy 02/10/2021 mild chronic cholecystitis and cholelithiasis.    Meds - benadryl, dilaudid, zofran, thiamine, mvi. Protonix bid.     Plan:   Current treatment for symptomatic control.  Dr. Eilleen Kempf returns Tuesday for evaluation for possible necrosectomy.     Dr. Roetta Sessions here tomorrow. Dr. Buena Irish on call after noon for urgent issues.    Floyde Parkins MD     [facility] After 5pm and weekends page oncall md at [phone]    Rhonda Hammond is a 31 y.o. female who is being seen on consult for pancreatitis.  Chief Complaint   Patient presents with    ABDOMINAL PAIN     Admission diagnosis: <principal problem not specified>  Active Hospital Problems    Diagnosis     Alcohol abuse [F10.10]     Pancreatic pseudocyst [K86.3]     Nausea and vomiting [R11.2]     Pancreatic pseudocyst/cyst [K24.0, K86.3]     Pancreatitis [K85.90]     Alcohol-induced acute pancreatitis [K85.20]     Dehydration [E86.0]      HPI:  31 y.o. female with history of pancreatitis, cholelithiasis s/p cholecystectomy, and alcohol use presents with increased abdominal pain, n/v. She had left but returned on 14december due to ongoing pain. She  understands she has a pseudocyst with necrosis complicating the prior pancreatitis. She has stopped alcohol. Unfortunately she has recently lost her job and is frustrated by her recovery from the pancreatitis. No overt bleeding. Having bowel movements. No vomiting today.   Past Medical History:   Diagnosis Date    Alcohol-induced acute pancreatitis     no longer drinks EtOH regularly    Class 1 obesity with body mass index (BMI) of 32.0 to 32.9 in adult     Gallstones     per pt report     Past Surgical History:   Procedure Laterality Date    LAP CHOLECYSTECTOMY N/A 02/10/2021    Procedure: CHOLECYSTECTOMY, LAPAROSCOPIC, WITH CHOLANGIOGRAM;  Surgeon: Wandra Scot, MD     Social History     Socioeconomic History    Marital status: Single     Spouse name: Not on file    Number of children: Not on file    Years of education: Not on file    Highest education level: Not on file   Occupational History    Not on file   Tobacco Use    Smoking status: Every Day     Types: Cigarettes    Smokeless tobacco: Never   Substance and Sexual Activity  Alcohol use: Not on file    Drug use: Not on file    Sexual activity: Not on file   Other Topics Concern    Not on file   Social History Narrative    Not on file     Social Determinants of Health     Financial Resource Strain: Not on file   Food Insecurity: Not on file   Transportation Needs: Not on file   Physical Activity: Not on file   Stress: Not on file   Social Connections: Not on file   Intimate Partner Violence: Not on file   Housing Stability: Not on file     Family History   Problem Relation Age of Onset    No Known Problems Mother     Other Family History Father         died from trauma     Allergies   Allergen Reactions    Droperidol neurological reaction     Pt states "face twitching and couldn't stop legs from moving"     Home Medications:     No outpatient medications have been marked as taking for the 12/29/21 encounter [facility](Hospital Encounter).     Current  Medications:      Current Facility-Administered Medications   Medication Dose Route Frequency Provider Last Rate Last Admin    acetaminophen (TylenoL) tablet 500 mg  500 mg Oral Q6H PRN Pokhriyal, Megha, DO,RES        albuterol (ProventiL, Ventolin) 2.5 mg /3 mL (0.083 %) nebulizer solution 2.5 mg  2.5 mg Inhalation Q2H PRN Resp Pokhriyal, Megha, DO,RES        calcium gluconate 2 gram/100 mL in 100ml NS IVPB 2 g  2 g Intravenous PRN Pokhriyal, Megha, DO,RES        calcium gluconate in 50ml NS IVPB 1 g  1 g Intravenous PRN Pokhriyal, Megha, DO,RES        diphenhydrAMINE (BenadryL) capsule 25 mg  25 mg Oral Q6H PRN Coltrain, Lauren E, MD,RES   25 mg at 12/30/21 1245    folic acid (Folvite) tablet 1 mg  1 mg Oral Daily Abelina BachelorHayes, Justin M, MD,RES   1 mg at 12/31/21 0626    heparin injection 5,000 Units  5,000 Units Subcutaneous Q8H Pokhriyal, Megha, DO,RES   5,000 Units at 12/30/21 0508    HYDROmorphone (Dilaudid) injection 0.5 mg  0.5 mg Intravenous Q4H PRN Pokhriyal, Megha, DO,RES        Or    HYDROmorphone (Dilaudid) injection 1 mg  1 mg Intravenous Q4H PRN Pokhriyal, Megha, DO,RES   1 mg at 12/31/21 [id number]    iron sucrose (Venofer) 300 mg in NS 250 mL IVPB  300 mg Intravenous Daily Tyrrell, [place] J, MD,RES        Lactated Ringers (LR) infusion  100 mL/hr Intravenous Continuous Pokhriyal, Megha, DO,RES 100 mL/hr at 12/31/21 0526 100 mL/hr at 12/31/21 0526    LORazepam (Ativan) tablet 2 mg  2 mg Oral Q Hour PRN Abelina BachelorHayes, Justin M, MD,RES        Or    LORazepam (Ativan) injection 2 mg  2 mg IV Push Q Hour PRN Abelina BachelorHayes, Justin M, MD,RES        magnesium oxide (Mag-Ox) tablet 400-800 mg  400-800 mg Oral PRN Pokhriyal, Megha, DO,RES        magnesium sulfate 1g/D5W 100ml ivpb 1 g  1 g IVPB PRN Pokhriyal, Megha, DO,RES        magnesium sulfate 2g/4850ml (  4%) in water IVPB 2 g  2 g IVPB PRN Pokhriyal, Megha, DO,RES        NS Flush injection 10 mL  10 mL Intravenous PRN Pokhriyal, Megha, DO,RES        ondansetron (PF) (Zofran) injection 4 mg  4 mg IV  Push Q8H PRN Pokhriyal, Megha, DO,RES   4 mg at 12/30/21 0841    pantoprazole (Protonix) 40 mg in sodium chloride 0.9 % 10 mL syringe  40 mg IV Push BID Barbaraann Barthel, MD        PHOS-NAK 280-160-250 mg powder 2 Packet  2 Packet Oral PRN Pokhriyal, Megha, DO,RES        potassium chloride (Klor-Con) packet 20-60 mEq  20-60 mEq Oral PRN Pokhriyal, Megha, DO,RES        potassium chloride ER (K-Dur;Klor-Con) tablet 20-60 mEq  20-60 mEq Oral PRN Pokhriyal, Megha, DO,RES        potassium chloride ER (K-Dur;Klor-Con) tablet 20-60 mEq  20-60 mEq Oral PRN Pokhriyal, Megha, DO,RES        potassium chloride in water (KCL) 10 mEq/100 mL IVPB 10 mEq  10 mEq Intravenous PRN Pokhriyal, Megha, DO,RES        potassium chloride in water 20 mEq/100 mL infusion 20 mEq  20 mEq Intravenous via Central Line PRN Pokhriyal, Megha, DO,RES        potassium chloride in water 20 mEq/50 mL infusion 20 mEq  20 mEq Intravenous via Central Line PRN Pokhriyal, Megha, DO,RES        potassium phosphate (K Phos Neutral/Phospha Neutral) 250 mg tablet 2 Tab  2 Tab Oral PRN Pokhriyal, Megha, DO,RES        sodium phosphate 12 mmol in D5W 100 mL IVPB  12 mmol Intravenous PRN Pokhriyal, Megha, DO,RES        sodium phosphate 18 mmol in D5W 100 mL IVPB  18 mmol Intravenous PRN Pokhriyal, Megha, DO,RES        sodium phosphate 21 mmol in D5W 100 mL IVPB  21 mmol Intravenous PRN Pokhriyal, Megha, DO,RES        sodium phosphate 6 mmol in D5W 50 mL IVPB  6 mmol Intravenous PRN Pokhriyal, Megha, DO,RES        sodium phosphate 9 mmol in D5W 100 mL IVPB  9 mmol Intravenous PRN Pokhriyal, Megha, DO,RES        therapeutic multivitamin-minerals (Theragran-M) 1 Tab  1 Tab Oral Daily Pokhriyal, Megha, DO,RES   1 Tab at 12/31/21 1610    thiamine tablet 100 mg  100 mg Oral Daily Pokhriyal, Megha, DO,RES   100 mg at 12/31/21 9604     Review of Systems:     Constitutional: negative for chills and fever   Skin: postitve for alopecia and rash   HENT: negative for headaches and  hearing loss   Eyes: negative for blurred vision and double vision   Cardiovascular: negative for chest pain, orthopnea   Respiratory: negative for cough or shortness of breath   Gastointestinal: positive for abdominal pain and nausea, negative for blood in stool, melena, and vomiting   Genitourinary: female negative VWU:[id number] and hematuria   Musculoskeletal: negative for falls   Endo: negative for cold intolerance and heat intolerance.   Heme: negative for easy bleeding and easy bruising   Allergies: negative for hay fever and hives   Neurological: negative for level of consciousness and seizures   Psychiatric:  negative for hallucinations and memory loss  BP 130/68   Pulse 65   Temp 98.3 F (36.8 C)   Resp 16   Ht 5\' 5"  (1.651 m)   Wt 100 kg (220 lb 7.4 oz)   SpO2 96%   BMI 36.69 kg/m     Physical Assessment:   Visit Vitals  BP 130/68   Pulse 65   Temp 98.3 F (36.8 C)   Resp 16   Ht 5\' 5"  (1.651 m)   Wt 100 kg (220 lb 7.4 oz)   SpO2 96%   BMI 36.69 kg/m     Intake/Output Summary (Last 24 hours) at 12/31/2021 1308  Last data filed at 12/31/2021 0700  Gross per 24 hour   Intake 0 ml   Output 500 ml   Net -500 ml     Constitutional: well developed, nourished, no distress and alert and oriented x 3   HENT: atraumatic, nose normal, normocephalic, left exterior ear normal, right external ear normal, and oropharynx clear and moist   Eyes: conjunctiva normal, EOM normal, and PERRL   Neck: ROM normal, supple, and trachea normal   Cardiovascular: heart sounds normal, intact distal pulses, normal rate, and regular rhythm   Pulmonary/Chest Wall: breath sounds normal and effort normal   Abdominal: appearance normal, bowel sounds normal, and soft tender   Genitourinary/Anorectal: deferred   Musculoskeletal: normal ROM   Neurological: awake, alert and oriented x 3   Skin: dry, intact, and warm   Psych: appropriate     CBC w/Diff    Lab Results   Component Value Date/Time    WBC 5.0 12/31/2021 01:27 AM    RBC  3.19 (L) 12/31/2021 01:27 AM    HEMOGLOBIN 7.9 (L) 12/31/2021 01:27 AM    HCT 22.8 (L) 12/31/2021 01:27 AM    MCV 72 (L) 12/31/2021 01:27 AM    MCH 25 (L) 12/31/2021 01:27 AM    MCHC 35 12/31/2021 01:27 AM    RDW 18.0 (H) 12/31/2021 01:27 AM    PLATELET 223 12/31/2021 01:27 AM    MPV 10.5 12/31/2021 01:27 AM    Lab Results   Component Value Date/Time    BANDS 1 07/14/2021 12:35 PM    SEGS 48 12/31/2021 01:27 AM    SEGS 60 07/14/2021 12:35 PM    LYMPHOCYTES 39 12/31/2021 01:27 AM    LYMPHOCYTES 30 07/14/2021 12:35 PM    MONOS 10 12/31/2021 01:27 AM    MONOS 8 07/14/2021 12:35 PM    EOS 3 12/31/2021 01:27 AM    EOS 1 07/14/2021 12:35 PM    BASOS 0 12/31/2021 01:27 AM    RDW 18.0 (H) 12/31/2021 01:27 AM     Hepatic Function    Lab Results   Component Value Date/Time    ALBUMIN 4.4 12/29/2021 03:22 PM    TOTPR 8.4 (H) 12/29/2021 03:22 PM    ALKPHOS 62 12/29/2021 03:22 PM    Lab Results   Component Value Date/Time    SGOTAST 20 12/29/2021 03:22 PM    SGPTALT 6 12/29/2021 03:22 PM    BILIT 0.3 12/29/2021 03:22 PM     Renal Function    Lab Results   Component Value Date/Time    NA 133 12/31/2021 01:27 AM    POTASSIUM 3.7 12/31/2021 01:27 AM    CHLORIDE 100 12/31/2021 01:27 AM    CO2 24 12/31/2021 01:27 AM    ANIONGAP 9.0 12/31/2021 01:27 AM    BUN 7 12/31/2021 01:27 AM    Lab Results  Component Value Date/Time    CREAT 0.4 (L) 12/31/2021 01:27 AM    GLUCOSE 91 12/31/2021 01:27 AM    CALCIUM 8.4 12/31/2021 01:27 AM    PHOSPHORUS 4.4 12/31/2021 01:27 AM    ALBUMIN 4.4 12/29/2021 03:22 PM     No results found for: "AMYLASE"  Lipase   Date Value Ref Range Status   12/29/2021 33 7 - 60 U/L Final   12/28/2021 71 (H) 7 - 60 U/L Final   12/22/2021 28 7 - 60 U/L Final   12/15/2021 134 (H) 7 - 60 U/L Final     No results found for: "PT"  No components found for: "PTT"  No results found for: "INR"    Recent Results (from the past 336 hour(s))   1. CT ABD/PELVIS-IV ONLY    Addendum: 12/28/2021    Addendum:    The pancreatic head  collection appears to be surrounded with pancreatic tissue and therefore representing walled-off necrosis.    Signed By: Migdalia Dk, MD on 12/28/2021 4:47 PM       Narrative    EXAM: CT ABD/PELVIS-IV ONLY    CLINICAL INDICATION/HISTORY: h/o pancreatic pseudocyst, worsening epigastric pain    COMPARISON: CT 11/25/2021 and several priors    TECHNIQUE:  CT abdomen and pelvis with IV contrast.  All CT scans at this facility are performed using dose optimization technique as appropriate to the performed examination, to include automated exposure control, adjustment of the mA and/or kV according to patient's size (including appropriate matching for site-specific examinations), or use of an iterative reconstruction technique.    FINDINGS:   Lower chest: Mild dependent atelectasis.    Liver: Negative.     Biliary: Cholecystectomy.    Pancreas: Negative.    Spleen: Redemonstration of pancreatic hypodense collection increasing in size measuring 4.8 x 4.1 cm (2.7 x 2.6 cm). This has been present since 05/29/2020 after an episode of acute pancreatitis on CT 01/28/2020.[place] Kitchen Remainder of the pancreas is unremarkable. No pancreatic ductal dilatation. Perhaps minimal increased peripancreatic stranding. No peripancreatic free fluid or new collection    Adrenal glands: Negative.    Kidneys: Negative.    Stomach, Small Bowel and Colon: Negative.    Pelvic Organs: Negative    Bladder: Negative.    Lymph nodes: No lymphadenopathy.     Vessels: Unremarkable for age.     Peritoneal Spaces: No free fluid or free air.    Body wall: Negative.    Bones: Unremarkable for age.     Impression       Interval increase in size of homogeneous hypodense pancreatic head cystic lesion 4.8 cm (previously 2.7 cm on 11/25/2021), the lesion has been present since 05/29/2020, presumably pseudocyst.  -Perhaps minimal increase peripancreatic stranding suggesting early recurrent pancreatitis.    Signed By: Migdalia Dk, MD on 12/28/2021 11:44  AM        Electronically signed by Martin Majestic, MD at 01/01/2022 10:50 AM EST

## 2022-01-01 NOTE — Progress Notes (Signed)
Formatting of this note is different from the original.  Images from the original note were not included.  Date: 01/01/2022  Time: 7:16 AM    Scan Patient Arm Band: [id number]    Primary Dx: Pancreatitis [K85.90]  Patient LOS: 3  Expected Discharge Date: Jan 01, 2022      Plan of Care:    IV Drips:   LR, Last Rate: 100 mL/hr (12/31/21 0526)    All drips have been reviewed    CVL / PIV Lines:     Patient Lines/Drains/Airways Status       Active Peripheral Venous Line / Central Venous Line / Arterial Line / Left Atrial Line / Epidural Line / Airway / Subcutaneous Line / Drain / PIV Line / Intraosseous Line       Name Placement date Placement time Site Days Last dressing change    PIV: 12/29/21 1536 20 gauge Forearm Anterior;Left;Proximal 12/29/21  1536  Forearm  2     PIV: 12/31/21 1531 22 gauge Forearm Anterior;Proximal;Right 12/31/21  1531  Forearm  less than 1                Dressing Dates , CHG Bath needs and patient presentation has been reviewed.    ACTIVE Foley Orders :     Foley 8-step care has been reviewed    Foley INDICATION has been reviewed     ACTIVE TELE order     Active Telemetry Orders (From admission, onward)      None         Tele Orders have been confirmed and reviewed.     ACTIVE Wounds     Patient Lines/Drains/Airways Status       Active Wound / Wound Vac Therapy / Rash / Burns       Name Placement date Placement time Site Days Additional Info Last dressing change    Incision: Abdomen 02/10/21 1051 02/10/21  1051  Abdomen  Trocar Sites X 4  324 Pre-existing: No                 Wound care Orders have been confirmed and reviewed.   LDA & Skin-man have been updated and reviewed for accuracy    Labs     Recent Labs     01/01/22  0331 12/31/21  0127 12/30/21  0757   NA 138 133 133   POTASSIUM 3.7 3.7 3.5   CREAT 0.5 0.4* 0.4*   GLUCOSE 96 91 91   CALCIUM 8.6 8.4 8.7   MAGNESIUM 1.7 1.7 1.7   PHOSPHORUS 4.3 4.4 4.6     H/H:   Lab Results   Component Value Date    HEMOGLOBIN 8.0 (L) 01/01/2022     HCT 23.6 (L) 01/01/2022     CPK: No results found for: "CPK"  Labs and Electrolyte Replacement protocols have been confirmed and reviewed.     Restraints     Restraint Orders have been reviewed for accuracy.     Communication Whiteboard updated, Bedside shift Report Completed. The Above orders and Lab results have been reviewed with off going nurse by Roxanne Gates, LPN          Electronically signed by Roxanne Gates, LPN at 14/43/1540  0:86 AM EST

## 2022-01-01 NOTE — Progress Notes (Signed)
Formatting of this note is different from the original.  Images from the original note were not included.      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 is a 31 y.o. female with a past medical history of chronic alcoholic pancreatitis who was admitted for acute on chronic alcoholic pancreatitis.    Plan for today:  - Pending GI recommendations  - Stop IVF  - Pain control with Percocet    Acute on chronic alcoholic pancreatitis c/b walled-off necrosis, improving  - CT abd/pelvis showing an interval increase in size of homogenous hypodense pancreatic head cystic lesion now 4.8cm representing walled-off necrosis  - afebrile without leukocytosis - likely not infected and no need for antibiotics at this time  []  GI consulted, pending recommendations  []  Will likely need endoscopic drainage of walled-off necrosis  []  Patient tolerating diet, stop IVF  []  IV PPI BID  []  Transition from IV Dilaudid to Percocet for pain control  []  IV Zofran for nausea  []  Regular diet    Iron deficiency anemia, stable  - Ganzoni iron deficit 2.2g  []  IV Venofer 300mg  x3 doses  []  Transition to iron tabs every other day for discharge    Heavy alcohol use  - no hx of withdrawals  []  CIWA with PRN Ativan  []  MV, thiamine, folate    Barriers to discharge: pending GI recommendations    Global Issues  DVT Prophylaxis: SQH  Diet: Regular  Code Status: Full  Physical Therapy: -  Occupational Therapy: -  PCP: None No PCP PCP, MD    Further management for [name] will be discussed on rounds with my attending.    Subjective:      Events of the last 24 hours:  No acute events overnight. Patient is tearful this morning stating that she wants to go home and see her son. Her pain is rated as 8/10, but she's willing to try PO pain meds today. Eating well without nausea or vomiting.    Review of Systems:      Positives are bolded.    Const: no fevers/chills  CV: no chest pain  Pulm: no Shortness of Breath  GI: no n/v, (+) abdominal pain  Neuro: no focal weakness/numbness/tingling    Current Inpatient Meds:     folic acid, 1 mg, Daily  heparin, 5,000 Units, Q8H  iron sucrose, 300 mg, Daily  pantoprazole (Protonix) 40 mg in sodium chloride 0.9 % 10 mL syringe, 40 mg, BID  therapeutic multivitamin-minerals, 1 Tab, Daily  thiamine, 100 mg, Daily      LR, Last Rate: 100 mL/hr (12/31/21 0526)    Objective:      Temp (24hrs), Avg:98.5 F (36.9 C), Min:98.1 F (36.7 C), Max:98.9 F (37.2 C)    BP 113/53   Pulse 56   Temp 98.1 F (36.7 C)   Resp 17   Ht 5\' 5"  (1.651 m)   Wt 98.9 kg (218 lb  0.6 oz)   SpO2 94%   BMI 36.28 kg/m     Patient Vitals for the past 24 hrs:   Temp Heart Rate Pulse Resp BP BP Mean SpO2 Weight   01/01/22 0324 98.1 F (36.7 C) -- 56 17 113/53 76 MM HG 94 % 98.9 kg (218 lb 0.6 oz)   01/01/22 0215 -- (!) 8 -- -- -- -- -- --   12/31/21 1925 98.4 F (36.9 C) -- 59 18 124/85 95 MM HG 98 % --   12/31/21 1513 98.9 F (37.2 C) 56 56 16 131/72 93 MM HG 97 % --   12/31/21 1236 98.3 F (36.8 C) 65 65 16 130/68 89 MM HG 96 % --   12/31/21 0717 98.6 F (37 C) 62 62 16 134/72 95 MM HG 96 % --     Intake/Output Summary (Last 24 hours) at 01/01/2022 0642  Last data filed at 01/01/2022 0324  Gross per 24 hour   Intake 240 ml   Output 200 ml   Net 40 ml     Positives are bolded.    Gen: Alert. NAD.  CV: RRR. Normal S1/S2. No m/r/g.  Pulm: CTAB. No crackles/wheezes/rhonchi. No accessory muscle use.  Abdomen: Soft, ND. Normal BS. Epigastric tenderness to palpation.  Ext: No c/c/e.  Neuro: A/A/O x4.  Skin: No petechiae. No rash.  Warm/dry/intact.  Psych: Appropriate mood and affect.    Labwork and Ancillary Studies:     Labs/Studies reviewed:    Most Recent CBC:  Lab Results   Component Value Date/Time    WBC 5.1 01/01/2022 03:31 AM    RBC 3.31 (L) 01/01/2022 03:31 AM    HEMOGLOBIN 8.0 (L) 01/01/2022 03:31 AM    HCT 23.6 (L) 01/01/2022 03:31 AM    MCV 71 (L) 01/01/2022 03:31 AM    MCH 24 (L) 01/01/2022 03:31 AM    MCHC 34 01/01/2022 03:31 AM    RDW 18.3 (H) 01/01/2022 03:31 AM    PLATELET 221 01/01/2022 03:31 AM    MPV 10.4 01/01/2022 03:31 AM     Most Recent BMP:  Lab Results   Component Value Date    NA 138 01/01/2022    POTASSIUM 3.7 01/01/2022    CHLORIDE 103 01/01/2022    CO2 23 01/01/2022    BUN 4 (L) 01/01/2022    CREAT 0.5 01/01/2022    GLUCOSE 96 01/01/2022    CALCIUM 8.6 01/01/2022    MAGNESIUM 1.7 01/01/2022    PHOSPHORUS 4.3 01/01/2022     Most Recent Liver Function:  Lab Results   Component Value Date/Time    SGOTAST 20 12/29/2021 03:22 PM    SGPTALT 6 12/29/2021 03:22 PM    ALKPHOS 62 12/29/2021 03:22 PM    BILIT 0.3 12/29/2021 03:22 PM    BILID <0.2 12/29/2021 03:22 PM    TOTPR 8.4 (H) 12/29/2021 03:22 PM    ALBUMIN 4.4 12/29/2021 03:22 PM     Most Recent Coagulation:  No results found for: "APTT", "PT", "INR"   Most Recent Lipid Panel:  Lab Results   Component Value Date/Time    TRIGLYCERIDE 48 12/30/2021 07:57 AM     Most Recent HbA1C/Microalbuminuria:  Lab Results   Component Value Date/Time    CREATUMGDL 63 05/26/2021 02:35 PM     Most Recent HIV/Hep C:  No results found for: "HIVABAG", "HIVINTERP", "HEPCAB"    Imaging/Studies    CT ABD/PELVIS-IV ONLY 12-31-2022  Impression    Addendum:  The pancreatic head collection appears to be surrounded with pancreatic tissue and therefore representing walled-off necrosis.    Interval increase in size of homogeneous hypodense pancreatic head cystic lesion 4.8 cm (previously 2.7 cm on 11/25/2021), the lesion has been present since 05/29/2020, presumably pseudocyst.  -Perhaps minimal  increase peripancreatic stranding suggesting early recurrent pancreatitis.    [place] J Tyrrell, MD,RES  [facility] PGY-1  Available Via Epic Chat  01/01/2022, 6:42 AM    Electronically signed by Retta Mac, MD at 01/01/2022 12:03 PM EST    Associated attestation - Retta Mac, MD - 01/01/2022 12:03 PM EST  Formatting of this note is different from the original.    [facility]  Attending Attestation    Assessment and Plan:     The team and I have reviewed the labwork of the last 24 hours as well as the ancillary studies as noted below.I personally performed a substantive portion of the care of this patient to include the history in entirety, the physical exam in entirety, and the medical decision making and formulation of the assessment and plan. I agree with the findings except as I have noted.    Active Hospital Problems    Diagnosis     Alcohol abuse     Pancreatic pseudocyst     Nausea and vomiting     Pancreatic pseudocyst/cyst     Pancreatitis     Alcohol-induced acute pancreatitis     Dehydration      Following up with GI today for increased pseudo/cyst and stable walled off necrosis. Otherwise advancing diet as tolerated.     Please see resident physician note for further review.   Appreciate consulting services recommendations and expertise.     Retta Mac, DO, MS, MBA  Assistant Professor of Medicine  [facility]

## 2022-01-01 NOTE — Progress Notes (Signed)
Formatting of this note is different from the original.  Images from the original note were not included.  Date: 01/01/2022  Time: 8:36 PM    Scan Patient Arm Band: [id number]    Primary Dx: Pancreatitis [K85.90]  Patient LOS: 3  Expected Discharge Date: Jan 01, 2022      Plan of Care:    IV Drips:     All drips have been reviewed    CVL / PIV Lines:     Patient Lines/Drains/Airways Status       Active Peripheral Venous Line / Central Venous Line / Arterial Line / Left Atrial Line / Epidural Line / Airway / Subcutaneous Line / Drain / PIV Line / Intraosseous Line       Name Placement date Placement time Site Days Last dressing change    PIV: 12/29/21 1536 20 gauge Forearm Anterior;Left;Proximal 12/29/21  1536  Forearm  3     PIV: 12/31/21 1531 22 gauge Forearm Anterior;Proximal;Right 12/31/21  1531  Forearm  1                Dressing Dates , CHG Bath needs and patient presentation has been reviewed.    ACTIVE Foley Orders :     Foley 8-step care has been reviewed    Foley INDICATION has been reviewed     ACTIVE TELE order     Active Telemetry Orders (From admission, onward)      None         Tele Orders have been confirmed and reviewed.     ACTIVE Wounds     Patient Lines/Drains/Airways Status       Active Wound / Wound Vac Therapy / Rash / Burns       Name Placement date Placement time Site Days Additional Info Last dressing change    Incision: Abdomen 02/10/21 1051 02/10/21  1051  Abdomen  Trocar Sites X 4  325 Pre-existing: No                 Wound care Orders have been confirmed and reviewed.   LDA & Skin-man have been updated and reviewed for accuracy    Labs     Recent Labs     01/01/22  0331 12/31/21  0127 12/30/21  0757   NA 138 133 133   POTASSIUM 3.7 3.7 3.5   CREAT 0.5 0.4* 0.4*   GLUCOSE 96 91 91   CALCIUM 8.6 8.4 8.7   MAGNESIUM 1.7 1.7 1.7   PHOSPHORUS 4.3 4.4 4.6     H/H:   Lab Results   Component Value Date    HEMOGLOBIN 8.0 (L) 01/01/2022    HCT 23.6 (L) 01/01/2022     CPK: No results found  for: "CPK"  Labs and Electrolyte Replacement protocols have been confirmed and reviewed.     Restraints     Restraint Orders have been reviewed for accuracy.     Communication Whiteboard updated, Bedside shift Report Completed. The Above orders and Lab results have been reviewed with off going nurse by Cooper Render, LPN          Electronically signed by Cooper Render, LPN at 36/64/4034  7:42 PM EST

## 2022-01-02 NOTE — Care Plan (Signed)
Formatting of this note might be different from the original.    Problem: Adult Inpatient Plan of Care  Goal: Absence of Hospital-Acquired Illness or Injury  Outcome: Progressing  Intervention: Identify and Manage Fall Risk  Flowsheets (Taken 01/02/2022 0420)  Safety Promotion/Fall Prevention:   fall prevention program maintained   clutter-free environment maintained   nonskid shoes/slippers when out of bed    Electronically signed by Cooper Render, LPN at 19/14/7829  5:62 AM EST

## 2022-01-02 NOTE — Progress Notes (Signed)
Formatting of this note might be different from the original.  [facility]  Cross Cover Note    BP: 118/72  Heart Rate: 60 (01/02/22 0321)  Pulse: 60 (01/02/22 0321)  Temp: 98.2 F (36.8 C)  Resp: 16  Height: 5\' 5"  (165.1 cm)  Weight: 97.3 kg (214 lb 9.6 oz)  BMI (Calculated): 34.96  SpO2: 96 %  Temp (24hrs), Avg:98.4 F (36.9 C), Min:98.2 F (36.8 C), Max:98.6 F (37 C)      Notified by RN that patient was having uncontrolled pain despite 0.5 mg Dilaudid every 4 hours, added a one-time Percocet for breakthrough pain in between Dilaudid doses.    Carolan Clines, MD, [facility], PGY-1     Electronically signed by Shela Commons, MD,RES at 01/02/2022  5:02 AM EST

## 2022-01-02 NOTE — Progress Notes (Signed)
Formatting of this note is different from the original.  Images from the original note were not included.      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 female with a past medical history of chronic alcoholic pancreatitis who was admitted for acute on chronic alcoholic pancreatitis with an increasing area of walled-off necrosis.    Plan for today:  - Pending eval by Dr. Eilleen Kempf  - Schedule Tylenol, IV Dilaudid PRN for pain  - IV Zofran for nausea  - Regular diet    Acute on chronic alcoholic pancreatitis c/b walled-off necrosis, improving  - CT abd/pelvis showing an interval increase in size of homogenous hypodense pancreatic head cystic lesion now 4.8cm representing walled-off necrosis  - afebrile without leukocytosis - likely not infected and no need for antibiotics at this time  []  Pending evaluation by Dr. on Tues 11/19  []  Will likely need endoscopic drainage of walled-off necrosis  []  IV PPI BID  []  Scheduled Tylenol q6h, IV Dilaudid 0.5-1mg  q4h PRN for pain  []  IV Zofran for nausea  []  Regular diet  []  GI following, appreciate recommendations    Iron deficiency anemia, stable  - Ganzoni iron deficit 2.2g  []  IV Venofer 300mg  x3 doses, last dose today  []  Transition to iron tabs every other day    Heavy alcohol use  - no hx of withdrawals  []  CIWA with PRN Ativan  []  MV, thiamine, folate    Barriers to discharge: pending GI recommendations    Global Issues  DVT Prophylaxis: SQH  Diet: Regular  Code Status: Full  Physical Therapy: -  Occupational Therapy: -  PCP: None No PCP PCP,  MD    Further management for Rhonda Hammond will be discussed on rounds with my attending.    Subjective:     Events of the last 24 hours:  No acute events overnight. Abdominal pain rated as 6/10 today. Eating well without nausea or vomiting.    Review of Systems:      Positives are bolded.    Const: no fevers/chills  CV: no chest pain  Pulm: no Shortness of Breath  GI: no n/v, (+) abdominal pain  Neuro: no focal weakness/numbness/tingling    Current Inpatient Meds:     folic acid, 1 mg, Daily  heparin, 5,000 Units, Q8H  iron sucrose, 300 mg, Daily  pantoprazole (Protonix) 40 mg in sodium chloride 0.9 % 10 mL syringe, 40 mg, BID  therapeutic multivitamin-minerals, 1 Tab, Daily  thiamine, 100 mg, Daily          Objective:      Temp (24hrs), Avg:98.4 F (36.9 C), Min:98.2 F (36.8 C), Max:98.6 F (37 C)    BP 118/72   Pulse 60   Temp 98.2 F (36.8 C)   Resp 16   Ht 5\' 5"  (1.651 m)   Wt 97.3 kg (214 lb 9.6 oz)   SpO2 96%  BMI 35.71 kg/m     Patient Vitals for the past 24 hrs:   Temp Heart Rate Pulse Resp BP BP Mean SpO2 Weight   01/02/22 0321 98.2 F (36.8 C) 60 60 16 118/72 87 MM HG 96 % 97.3 kg (214 lb 9.6 oz)   01/01/22 2001 98.5 F (36.9 C) 64 64 16 151/85 (!) 107 MM HG 98 % --   01/01/22 1611 98.5 F (36.9 C) -- 53 16 125/69 90 MM HG 99 % --   01/01/22 1135 98.6 F (37 C) 71 71 16 157/109 (!) 122 MM HG 99 % --   01/01/22 0800 98.2 F (36.8 C) 66 66 18 122/66 85 MM HG 97 % --     Intake/Output Summary (Last 24 hours) at 01/02/2022 0701  Last data filed at 01/02/2022 0300  Gross per 24 hour   Intake 480 ml   Output 1100 ml   Net -620 ml     Positives are bolded.    Gen: Alert. NAD.  CV: RRR. Normal S1/S2. No m/r/g.  Pulm: CTAB. No crackles/wheezes/rhonchi. No accessory muscle use.  Abdomen: Soft, ND. Normal BS. Epigastric tenderness to palpation.  Ext: No c/c/e.  Neuro: A/A/O x4.  Skin: No petechiae. No rash. Warm/dry/intact.  Psych: Appropriate mood and affect.    Labwork and Ancillary  Studies:     Labs/Studies reviewed:    Most Recent CBC:  Lab Results   Component Value Date/Time    WBC 5.1 01/02/2022 03:19 AM    RBC 3.37 (L) 01/02/2022 03:19 AM    HEMOGLOBIN 8.4 (L) 01/02/2022 03:19 AM    HCT 24.0 (L) 01/02/2022 03:19 AM    MCV 71 (L) 01/02/2022 03:19 AM    MCH 25 (L) 01/02/2022 03:19 AM    MCHC 35 01/02/2022 03:19 AM    RDW 18.2 (H) 01/02/2022 03:19 AM    PLATELET 235 01/02/2022 03:19 AM    MPV 10.0 01/02/2022 03:19 AM     Most Recent BMP:  Lab Results   Component Value Date    NA 139 01/02/2022    POTASSIUM 3.8 01/02/2022    CHLORIDE 104 01/02/2022    CO2 23 01/02/2022    BUN 4 (L) 01/02/2022    CREAT 0.5 01/02/2022    GLUCOSE 95 01/02/2022    CALCIUM 8.8 01/02/2022    MAGNESIUM 1.8 01/02/2022    PHOSPHORUS 4.9 (H) 01/02/2022     Most Recent Liver Function:  Lab Results   Component Value Date/Time    SGOTAST 20 12/29/2021 03:22 PM    SGPTALT 6 12/29/2021 03:22 PM    ALKPHOS 62 12/29/2021 03:22 PM    BILIT 0.3 12/29/2021 03:22 PM    BILID <0.2 12/29/2021 03:22 PM    TOTPR 8.4 (H) 12/29/2021 03:22 PM    ALBUMIN 4.4 12/29/2021 03:22 PM     Most Recent Coagulation:  No results found for: "APTT", "PT", "INR"   Most Recent Lipid Panel:  Lab Results   Component Value Date/Time    TRIGLYCERIDE 48 12/30/2021 07:57 AM     Most Recent HbA1C/Microalbuminuria:  Lab Results   Component Value Date/Time    CREATUMGDL 63 05/26/2021 02:35 PM     Most Recent HIV/Hep C:  No results found for: "HIVABAG", "HIVINTERP", "HEPCAB"    Imaging/Studies    CT ABD/PELVIS-IV ONLY Jan 25, 2023  Impression    Addendum:    The pancreatic head collection appears to be surrounded with pancreatic tissue and therefore representing walled-off necrosis.  Interval increase in size of homogeneous hypodense pancreatic head cystic lesion 4.8 cm (previously 2.7 cm on 11/25/2021), the lesion has been present since 05/29/2020, presumably pseudocyst.  -Perhaps minimal increase peripancreatic stranding suggesting early recurrent  pancreatitis.    [place] J Tyrrell, MD,RES  [facility] PGY-1  Available Via Epic Chat  01/02/2022, 7:01 AM    Electronically signed by Terrall Laity, MD at 01/02/2022  2:41 PM EST

## 2022-01-02 NOTE — Progress Notes (Signed)
Formatting of this note is different from the original.  Date: 01/02/2022  Time: 7:30 AM    Scan Patient Arm Band: [id number]    Primary Dx: Pancreatitis [K85.90]  Patient LOS: 4  Expected Discharge Date: Jan 01, 2022      Plan of Care:    IV Drips:     All drips have been reviewed    CVL / PIV Lines:     Patient Lines/Drains/Airways Status       Active Peripheral Venous Line / Central Venous Line / Arterial Line / Left Atrial Line / Epidural Line / Airway / Subcutaneous Line / Drain / PIV Line / Intraosseous Line       Name Placement date Placement time Site Days Last dressing change    PIV: 12/29/21 1536 20 gauge Forearm Anterior;Left;Proximal 12/29/21  1536  Forearm  3     PIV: 12/31/21 1531 22 gauge Forearm Anterior;Proximal;Right 12/31/21  1531  Forearm  1                Dressing Dates , CHG Bath needs and patient presentation has been reviewed.    ACTIVE Foley Orders :     Foley 8-step care has been reviewed    Foley INDICATION has been reviewed     ACTIVE TELE order     Active Telemetry Orders (From admission, onward)      None         Tele Orders have been confirmed and reviewed.     ACTIVE Wounds     Patient Lines/Drains/Airways Status       Active Wound / Wound Vac Therapy / Rash / Burns       Name Placement date Placement time Site Days Additional Info Last dressing change    Incision: Abdomen 02/10/21 1051 02/10/21  1051  Abdomen (Comment) Trocar Sites X 4  325 Pre-existing: No                 Wound care Orders have been confirmed and reviewed.   LDA & Skin-man have been updated and reviewed for accuracy    Labs     Recent Labs     01/02/22  0319 01/01/22  0331 12/31/21  0127   NA 139 138 133   POTASSIUM 3.8 3.7 3.7   CREAT 0.5 0.5 0.4*   GLUCOSE 95 96 91   CALCIUM 8.8 8.6 8.4   MAGNESIUM 1.8 1.7 1.7   PHOSPHORUS 4.9* 4.3 4.4     H/H:   Lab Results   Component Value Date    HEMOGLOBIN 8.4 (L) 01/02/2022    HCT 24.0 (L) 01/02/2022     CPK: No results found for: "CPK"  Labs and Electrolyte  Replacement protocols have been confirmed and reviewed.     Restraints     Restraint Orders have been reviewed for accuracy.     Communication Whiteboard updated, Bedside shift Report Completed. The Above orders and Lab results have been reviewed with off going nurse by Earley Brooke, RN          Electronically signed by Earley Brooke, RN at 01/02/2022  7:31 AM EST

## 2022-01-02 NOTE — Case Communication (Signed)
Formatting of this note is different from the original.    [facility] NOTE    S Situation  Name: Rhonda Hammond is a 31 y.o. female in room K629/K629-01  Problem: pt is c/o inadequate pain control with current regimen.      B Background   Admitted Date/Time: 12/29/2021  2:03 PM  Patient Active Problem List   Diagnosis    Dehydration    Pancreatitis    Alcohol-induced acute pancreatitis    Chronic abdominal pain    Obesity    Intractable vomiting    Intractable nausea and vomiting    Pancreatic pseudocyst/cyst    Alcohol abuse    Pancreatic pseudocyst    Nausea and vomiting     Past medical history   Past Medical History:   Diagnosis Date    Alcohol-induced acute pancreatitis     no longer drinks EtOH regularly    Class 1 obesity with body mass index (BMI) of 32.0 to 32.9 in adult     Gallstones     per pt report     Treatment/intervention to date: IV dilaudid      A Assessment  Changes from prior assessments:   Pain - inadequate control    Data reported to MD:  Comment see above        R Recommendation  Po prn medication between prn  Test Recommendation: none    Treatment Plan:  Orders Received:Yes  Interventions Given:Yes    Other Comments: one time dose of 5-325mg  of po percocet ordered.     Time frame to call if the patient doesn't improve: n/a  Physician Notified: Carolan Clines, MD                                      Date: 01/02/22        Time: [phone]     Electronically signed by Donovan Kail, RN at 01/02/2022  5:19 AM EST

## 2022-01-02 NOTE — Progress Notes (Signed)
Formatting of this note might be different from the original.  I, Lilyan Gilford, MD  have interviewed and examined Rhonda Hammond and discussed his care with the resident team. I have reviewed the chart on Rhonda Hammond and I agree with the documentation as charted. Tol PO. Pain improving but has still req'd IV narcotics.    BP 122/60   Pulse 56   Temp 97.9 F (36.6 C)   Resp 14   Ht 5\' 5"  (1.651 m)   Wt 97.3 kg (214 lb 9.6 oz)   SpO2 96%   BMI 35.71 kg/m     NAD  A/A    A/P    Acute on chronic pancreatitis with pseudocyst and necrosis  Acute on chronic anemia  Pain related to #1    Cont PO  Cont pain meds  As she still req IV pain meds likely will need Dr. Donnie Mesa to eval for necrosectomy  Hgb stable - trend    [name], MD  [facility] Internal Medicine  [phone] [facility] (o)  Electronically signed by Lilyan Gilford, MD at 01/02/2022  2:19 PM EST

## 2022-01-02 NOTE — Progress Notes (Signed)
Formatting of this note is different from the original.  Images from the original note were not included.      GASTROENTEROLOGY PROGRESS NOTE    Assessment:   Rhonda Hammond is a 31 y.o. 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     Active Problems:  Acute on chronic pancreatitis now with imaging showing pseudocyst with necrosis. Alcohol history.         Ongoing pain intermittently. Wbc 5 Lipase 33.   CT 12/28/2021 4.8 cm lesion pancreatic head with walled off necrosis. Increased in size compared to prior. S/p cholecystectomy. Splenic collection 4.8x4.1 cm.   2. Acute on chronic anemia 7.9 with microcytic indices. To receive iv iron. (Baseline around 9). Iron saturation 31% and ferritin 16.  3. HCG negative.   4. RAD    Cholecystectomy 02/10/2021 mild chronic cholecystitis and cholelithiasis.    Meds - benadryl, dilaudid, zofran, thiamine, mvi. Protonix bid.     Plan:     Continue to push diet as tolerated   If pain continues to improve then likely will be OK for outpatient discharge and interval follow up imaging of ANC, whereas if intolerance of optimal diet for nutrition or persistent pain then will need EUS-LAMS necrosectomy prior to DC   Following     01/02/2022 7:43 AM    Percell Miller C. Tresa Garter, MD   [facility]  Pager [phone]  Office [phone]  Weekends or after 5pm please page MD on call    Subjective     Reports that pain is improving slightly   Eating OK   Labs stable   Afebrile     Objective     VS: BP 118/72   Pulse 60   Temp 98.2 F (36.8 C)   Resp 16   Ht 5\' 5"  (1.651 m)   Wt 97.3 kg (214 lb 9.6 oz)   SpO2 96%   BMI 35.71 kg/m     Intake/Output Summary (Last 24 hours) at 01/02/2022 0743  Last data filed at 01/02/2022 0300  Gross per 24 hour   Intake 480 ml   Output 1100 ml   Net -620 ml     General: NAD, resting comfortably  HEENT: no scleral icterus, conjunctiva normal  CV: RRR, No M  Pulm: normal resp  effort with good bilateral air movement. CTAB.  Abdomen: Soft, nontender. no rebound/guarding. Limited assessment for hepatosplenomegaly  Ext: No edema   Neuro: no asterixis; A&O x3   Skin: no palmar erythema, no spider angiomata    Labs:    Metabolic Profile  Lab Results   Component Value Date    NA 139 01/02/2022    POTASSIUM 3.8 01/02/2022    CHLORIDE 104 01/02/2022    CO2 23 01/02/2022    BUN 4 (L) 01/02/2022    CREAT 0.5 01/02/2022    GLUCOSE 95 01/02/2022    CALCIUM 8.8 01/02/2022    MAGNESIUM 1.8 01/02/2022    PHOSPHORUS 4.9 (H) 01/02/2022     Lab Results   Component Value Date    ALBUMIN 4.4 12/29/2021    TOTPR 8.4 (H) 12/29/2021    SGOTAST 20 12/29/2021    SGPTALT 6 12/29/2021    ALKPHOS 62 12/29/2021    AGRAT 1.1 12/29/2021    BILIT 0.3 12/29/2021    BILID <0.2 12/29/2021    GLOBULIN 4.0 12/29/2021     CBC w/Diff  Lab Results   Component  Value Date/Time    WBC 5.1 01/02/2022 03:19 AM    RBC 3.37 (L) 01/02/2022 03:19 AM    HEMOGLOBIN 8.4 (L) 01/02/2022 03:19 AM    HCT 24.0 (L) 01/02/2022 03:19 AM    MCV 71 (L) 01/02/2022 03:19 AM    MCH 25 (L) 01/02/2022 03:19 AM    MCHC 35 01/02/2022 03:19 AM    RDW 18.2 (H) 01/02/2022 03:19 AM    PLATELET 235 01/02/2022 03:19 AM    MPV 10.0 01/02/2022 03:19 AM    BANDS 1 07/14/2021 12:35 PM    SEGS 43 01/02/2022 03:19 AM    SEGS 60 07/14/2021 12:35 PM    LYMPHOCYTES 43 01/02/2022 03:19 AM    LYMPHOCYTES 30 07/14/2021 12:35 PM    MONOS 11 01/02/2022 03:19 AM    MONOS 8 07/14/2021 12:35 PM    EOS 3 01/02/2022 03:19 AM    EOS 1 07/14/2021 12:35 PM    BASOS 0 01/02/2022 03:19 AM       Electronically signed by Lanell Matar, MD at 01/02/2022  7:47 AM EST

## 2022-01-02 NOTE — Progress Notes (Signed)
Formatting of this note is different from the original.  Date: 01/02/2022  Time: 6:49 PM    Scan Patient Arm Band: pt off of floor @ this time    Primary Dx: Pancreatitis [K85.90]  Patient LOS: 4  Expected Discharge Date: Jan 04, 2022      Plan of Care:    IV Drips:     All drips have been reviewed    CVL / PIV Lines:     Patient Lines/Drains/Airways Status       Active Peripheral Venous Line / Central Venous Line / Arterial Line / Left Atrial Line / Epidural Line / Airway / Subcutaneous Line / Drain / PIV Line / Intraosseous Line       Name Placement date Placement time Site Days Last dressing change    PIV: 12/29/21 1536 20 gauge Forearm Anterior;Left;Proximal 12/29/21  1536  Forearm  4     PIV: 12/31/21 1531 22 gauge Forearm Anterior;Proximal;Right 12/31/21  1531  Forearm  2                Dressing Dates , CHG Bath needs and patient presentation has been reviewed.    ACTIVE Foley Orders :     Foley 8-step care has been reviewed    Foley INDICATION has been reviewed     ACTIVE TELE order     Active Telemetry Orders (From admission, onward)      None         Tele Orders have been confirmed and reviewed.     ACTIVE Wounds     Patient Lines/Drains/Airways Status       Active Wound / Wound Vac Therapy / Rash / Burns       Name Placement date Placement time Site Days Additional Info Last dressing change    Incision: Abdomen 02/10/21 1051 02/10/21  1051  Abdomen (Comment) Trocar Sites X 4  326 Pre-existing: No                 Wound care Orders have been confirmed and reviewed.   LDA & Skin-man have been updated and reviewed for accuracy    Labs     Recent Labs     01/02/22  0319 01/01/22  0331 12/31/21  0127   NA 139 138 133   POTASSIUM 3.8 3.7 3.7   CREAT 0.5 0.5 0.4*   GLUCOSE 95 96 91   CALCIUM 8.8 8.6 8.4   MAGNESIUM 1.8 1.7 1.7   PHOSPHORUS 4.9* 4.3 4.4     H/H:   Lab Results   Component Value Date    HEMOGLOBIN 8.4 (L) 01/02/2022    HCT 24.0 (L) 01/02/2022     CPK: No results found for: "CPK"  Labs and Electrolyte  Replacement protocols have been confirmed and reviewed.     Restraints     Restraint Orders have been reviewed for accuracy.     Communication Whiteboard updated, Bedside shift Report Completed. The Above orders and Lab results have been reviewed with off going nurse by Joylene Igo, LPN          Electronically signed by Joylene Igo, LPN at 84/16/6063  0:16 PM EST

## 2022-01-02 NOTE — Care Plan (Signed)
Formatting of this note might be different from the original.    Problem: Adult Inpatient Plan of Care  Goal: Plan of Care Review  01/02/2022 0253 by Donovan Kail, RN  Outcome: Progressing  01/02/2022 0253 by Donovan Kail, RN  Outcome: Progressing  Goal: Patient-Specific Goal (Individualized)  01/02/2022 0253 by Donovan Kail, RN  Outcome: Progressing  01/02/2022 0253 by Donovan Kail, RN  Outcome: Progressing  Goal: Absence of Hospital-Acquired Illness or Injury  01/02/2022 0253 by Donovan Kail, RN  Outcome: Progressing  01/02/2022 0253 by Donovan Kail, RN  Outcome: Progressing  Goal: Optimal Comfort and Wellbeing  01/02/2022 0253 by Donovan Kail, RN  Outcome: Progressing  01/02/2022 0253 by Donovan Kail, RN  Outcome: Progressing  Goal: Readiness for Transition of Care  01/02/2022 0253 by Donovan Kail, RN  Outcome: Progressing  01/02/2022 0253 by Donovan Kail, RN  Outcome: Progressing    Problem: Fall Prevention  Goal: Prevent/Manage Accidental Injury (Falls)  Description: Consider requesting a pharmacologic review as needed.  Consider asking the MD to consult PT / OT for an evaluation.  01/02/2022 0253 by Donovan Kail, RN  Outcome: Progressing  01/02/2022 0253 by Donovan Kail, RN  Outcome: Progressing    Problem: Pain Acute  Goal: Optimal Pain Control and Function  Outcome: Progressing    Electronically signed by Donovan Kail, RN at 01/02/2022  2:53 AM EST

## 2022-01-02 NOTE — Care Plan (Signed)
Formatting of this note might be different from the original.    Problem: Adult Inpatient Plan of Care  Goal: Plan of Care Review  Outcome: Progressing  Goal: Patient-Specific Goal (Individualized)  Outcome: Progressing  Goal: Absence of Hospital-Acquired Illness or Injury  Outcome: Progressing  Intervention: Identify and Manage Fall Risk  Flowsheets (Taken 01/02/2022 0420 by Cooper Render, LPN)  Safety Promotion/Fall Prevention:   fall prevention program maintained   clutter-free environment maintained   nonskid shoes/slippers when out of bed  Intervention: Prevent Skin Injury  Flowsheets (Taken 01/02/2022 0200 by Stacey Drain)  Body Position: position changed independently  Skin Protection: tubing/devices free from skin contact  Intervention: Prevent and Manage VTE (Venous Thromboembolism) Risk  Flowsheets (Taken 01/02/2022 0200 by Stacey Drain)  VTE Prevention/Management: SCDs (sequential compression devices) off  Intervention: Prevent Infection  Flowsheets (Taken 01/01/2022 0928 by Roxanne Gates, LPN)  Infection Prevention:   rest/sleep promoted   single patient room provided  Goal: Optimal Comfort and Wellbeing  Outcome: Progressing  Intervention: Monitor Pain and Promote Comfort  Flowsheets (Taken 01/02/2022 0839)  Pain Management Interventions:   care clustered   pain management plan reviewed with patient/caregiver   pain medication given  Intervention: [place] (Taken 01/02/2022 0839)  Trust Relationship/Rapport:   care explained   choices provided   thoughts/feelings acknowledged   reassurance provided   questions encouraged   questions answered  Goal: Readiness for Transition of Care  Outcome: Progressing    Problem: Fall Prevention  Goal: Prevent/Manage Accidental Injury (Falls)  Description: Consider requesting a pharmacologic review as needed.  Consider asking the MD to consult PT / OT for an evaluation.  Outcome: Progressing    Problem: Pain Acute  Goal: Optimal  Pain Control and Function  Outcome: Progressing  Intervention: Develop Pain Management Plan  Flowsheets (Taken 01/02/2022 0839)  Pain Management Interventions:   care clustered   pain management plan reviewed with patient/caregiver   pain medication given  Intervention: Prevent or Manage Pain  Flowsheets (Taken 01/01/2022 0928 by Roxanne Gates, LPN)  Medication Review/Management: medications reviewed  Intervention: [place] (Taken 01/02/2022 0839)  Diversional Activities:   smartphone   television    Electronically signed by Earley Brooke, RN at 01/02/2022 11:46 AM EST

## 2022-01-03 NOTE — Progress Notes (Signed)
Formatting of this note is different from the original.  Images from the original note were not included.    GI PROGRESS NOTE    Admit Date: 12/29/2021  Reason for initial consult- pancreatitis     Assessment:   Acute on chronic pancreatitis now with imaging showing pseudocyst with necrosis. Alcohol history.         Ongoing pain intermittently. Wbc 5 Lipase 33.   CT 12/28/2021 4.8 cm lesion pancreatic head with walled off necrosis. Increased in size compared to prior. S/p cholecystectomy. Splenic collection 4.8x4.1 cm.   2. Acute on chronic anemia 7.9 with microcytic indices. To receive iv iron. (Baseline around 9). Iron saturation 31% and ferritin 16.  3. HCG negative.   4. RAD    Cholecystectomy 02/10/2021 mild chronic cholecystitis and cholelithiasis.    Meds - benadryl, dilaudid, zofran, thiamine, mvi. Protonix bid.     Plan:     EUS tomm with axios if willing to stay  NPO after MN  Hold any AC    Informed Consent: The risks, benefits and alternatives of the procedure and the sedation options were discussed with the patient. The patient is aware of potential complications including but not limited to bleeding, perforation, aspiration, infection, sedation adverse events, missed diagnosis, missed lesions, intravenous site complications, potential need for additional procedures, and death. All questions were answered and informed consent was obtained from the patient . Informed consent is documented on MEDICAL RECORD [id number]    Vida Rigger, FNP-C  [id number] M-F 9-5 please page [phone]. Weekends or after 5pm please page MD on call or call the office at [phone] for assistance.     01/03/2022 12:13 PM     Addendum:  I have reviewed the nurse practitioner's note as well as performed an independent interview and exam, I have reviewed the pertinent labs and X-rays and agree with the assessment and plan as outlined above.     Some pain requiring Dilaudid   Tolerating diet     BP 123/60   Pulse 62   Temp 98.6 F (37 C)    Resp 17   Ht 5\' 5"  (1.651 m)   Wt 96.5 kg (212 lb 11.9 oz)   SpO2 99%   BMI 35.40 kg/m   AAO x 3   RRR  CTAB  Abdomen soft     Labs and imaging reviewed     CBC w/Diff  Lab Results   Component Value Date/Time    WBC 6.6 01/03/2022 01:38 AM    RBC 3.89 01/03/2022 01:38 AM    HEMOGLOBIN 9.6 (L) 01/03/2022 01:38 AM    HCT 27.4 (L) 01/03/2022 01:38 AM    MCV 70 (L) 01/03/2022 01:38 AM    MCH 25 (L) 01/03/2022 01:38 AM    MCHC 35 01/03/2022 01:38 AM    RDW 18.8 (H) 01/03/2022 01:38 AM    PLATELET 238 01/03/2022 01:38 AM    MPV 9.7 01/03/2022 01:38 AM    BANDS 1 07/14/2021 12:35 PM    SEGS 53 01/03/2022 01:38 AM    SEGS 60 07/14/2021 12:35 PM    LYMPHOCYTES 33 01/03/2022 01:38 AM    LYMPHOCYTES 30 07/14/2021 12:35 PM    MONOS 11 01/03/2022 01:38 AM    MONOS 8 07/14/2021 12:35 PM    EOS 2 01/03/2022 01:38 AM    EOS 1 07/14/2021 12:35 PM    BASOS 0 01/03/2022 01:38 AM     Recommendations:  EUS with Axios tomorrow   NPO after  midnight   Stressed the importance of tobacco and alcohol cessation  Diet as tolerated   Anti-emetics and analgesics as needed     Ilda Foil, MD [facility] FACP  [facility]  Office [phone]  Pager [phone]  Weekends or after 5pm please page MD on call    (additional independent time spent  15 minutes)       Subjective:     No abdominal pain, nausea, vomiting, no blood in stool    Objective:     BP 123/60   Pulse 62   Temp 98.6 F (37 C)   Resp 17   Ht 5\' 5"  (1.651 m)   Wt 96.5 kg (212 lb 11.9 oz)   SpO2 99%   BMI 35.40 kg/m   Temp (24hrs), Avg:98.4 F (36.9 C), Min:97.9 F (36.6 C), Max:98.8 F (37.1 C)    STOOL:    Physical Exam:  General Appearance: Appears in no acute distress.,  Skin: Skin warm & dry, No rash, jaundice, pallor,   HEENT: Moist oral mucous membranes,   Lungs: Clear, No wheezes., No rhonchi, Normal respiratory effort,   Heart: Regular rate and rhythm,   Abdomen: Soft , Non-distended, Normal bowel sounds and Non-tender,   Neuro: alert,  oriented, affect appropriate and speech fluent    Intake/Output Summary (Last 24 hours) at 01/03/2022 1049  Last data filed at 01/03/2022 0418  Gross per 24 hour   Intake 720 ml   Output --   Net 720 ml     Recent Labs     01/03/22  0138 01/02/22  0319   WBC 6.6 5.1   RBC 3.89 3.37*   HEMOGLOBIN 9.6* 8.4*   HCT 27.4* 24.0*   MCV 70* 71*   MCHC 35 35   RDW 18.8* 18.2*   PLATELET 238 235   MPV 9.7 10.0     Recent Labs     01/03/22  0138 01/02/22  0319   NA 139 139   POTASSIUM 4.1 3.8   CHLORIDE 103 104   CO2 22 23   ANIONGAP 14.0 12.0   CALCIUM 9.5 8.8   BUN 6 4*   CREAT 0.4* 0.5   GLUCOSE 100* 95     No results for input(s): "PT", "INR" in the last 72 hours.    No results for input(s): "ALBUMIN", "TOTPR", "ALKPHOS", "SGOTAST", "SGPTALT", "BILIT", "BILID" in the last 72 hours.    Invalid input(s): "CONJUGATEDF"    No results for input(s): "AMYLASE", "LIPASE" in the last 72 hours.    Current Facility-Administered Medications   Medication Dose Route Frequency Provider Last Rate Last Admin    acetaminophen (TylenoL) tablet 650 mg  650 mg Oral Q6H Tyrrell, 01/04/22 J, MD,RES   650 mg at 01/03/22 0915    albuterol (ProventiL, Ventolin) 2.5 mg /3 mL (0.083 %) nebulizer solution 2.5 mg  2.5 mg Inhalation Q2H PRN Resp Pokhriyal, Megha, DO,RES        calcium gluconate 2 gram/100 mL in 01/05/22 NS IVPB 2 g  2 g Intravenous PRN Pokhriyal, Megha, DO,RES        calcium gluconate in 70ml NS IVPB 1 g  1 g Intravenous PRN Pokhriyal, Megha, DO,RES        diphenhydrAMINE (BenadryL) capsule 25 mg  25 mg Oral Q6H PRN Coltrain, Lauren E, MD,RES   25 mg at 12/30/21 1245    ferrous sulfate (Feosol) tablet 325 mg  325 mg Oral Every Other Day 01/01/22, Collier Salina  J, MD,RES   325 mg at 81/82/99 3716    folic acid (Folvite) tablet 1 mg  1 mg Oral Daily Camelia Phenes, MD,RES   1 mg at 01/03/22 0414    heparin injection 5,000 Units  5,000 Units Subcutaneous Q8H Pokhriyal, Megha, DO,RES   5,000 Units at 12/30/21 9678    HYDROmorphone (Dilaudid) injection  0.5 mg  0.5 mg Intravenous Q4H PRN Tyrrell, [place] J, MD,RES        Or    HYDROmorphone (Dilaudid) injection 1 mg  1 mg Intravenous Q4H PRN Tyrrell, [place] J, MD,RES   1 mg at 01/03/22 0915    LORazepam (Ativan) tablet 2 mg  2 mg Oral Q Hour PRN Camelia Phenes, MD,RES        Or    LORazepam (Ativan) injection 2 mg  2 mg IV Push Q Hour PRN Camelia Phenes, MD,RES        magnesium oxide (Mag-Ox) tablet 400-800 mg  400-800 mg Oral PRN Pokhriyal, Megha, DO,RES   400 mg at 01/03/22 0915    magnesium sulfate 1g/D5W 145ml ivpb 1 g  1 g IVPB PRN Pokhriyal, Megha, DO,RES 200 mL/hr at 01/02/22 0528 1 g at 01/02/22 0528    magnesium sulfate 2g/55ml (4%) in water IVPB 2 g  2 g IVPB PRN Pokhriyal, Megha, DO,RES        NS Flush injection 10 mL  10 mL Intravenous PRN Pokhriyal, Megha, DO,RES        ondansetron (PF) (Zofran) injection 4 mg  4 mg IV Push Q8H PRN Pokhriyal, Megha, DO,RES   4 mg at 01/01/22 1200    pantoprazole (Protonix) 40 mg in sodium chloride 0.9 % 10 mL syringe  40 mg IV Push BID Barbaraann Barthel, MD   40 mg at 01/03/22 0414    PHOS-NAK 280-160-250 mg powder 2 Packet  2 Packet Oral PRN Pokhriyal, Megha, DO,RES        potassium chloride (Klor-Con) packet 20-60 mEq  20-60 mEq Oral PRN Pokhriyal, Megha, DO,RES   20 mEq at 01/02/22 0601    potassium chloride ER (K-Dur;Klor-Con) tablet 20-60 mEq  20-60 mEq Oral PRN Pokhriyal, Megha, DO,RES        potassium chloride ER (K-Dur;Klor-Con) tablet 20-60 mEq  20-60 mEq Oral PRN Pokhriyal, Megha, DO,RES        potassium chloride in water (KCL) 10 mEq/100 mL IVPB 10 mEq  10 mEq Intravenous PRN Pokhriyal, Megha, DO,RES        potassium chloride in water 20 mEq/100 mL infusion 20 mEq  20 mEq Intravenous via Central Line PRN Pokhriyal, Megha, DO,RES        potassium chloride in water 20 mEq/50 mL infusion 20 mEq  20 mEq Intravenous via Central Line PRN Pokhriyal, Megha, DO,RES        potassium phosphate (K Phos Neutral/Phospha Neutral) 250 mg tablet 2 Tab  2 Tab Oral PRN  Pokhriyal, Megha, DO,RES        sodium phosphate 12 mmol in D5W 100 mL IVPB  12 mmol Intravenous PRN Pokhriyal, Megha, DO,RES        sodium phosphate 18 mmol in D5W 100 mL IVPB  18 mmol Intravenous PRN Pokhriyal, Megha, DO,RES        sodium phosphate 21 mmol in D5W 100 mL IVPB  21 mmol Intravenous PRN Pokhriyal, Megha, DO,RES        sodium phosphate 6 mmol in D5W 50 mL IVPB  6 mmol Intravenous PRN  Pokhriyal, Megha, DO,RES        sodium phosphate 9 mmol in D5W 100 mL IVPB  9 mmol Intravenous PRN Pokhriyal, Megha, DO,RES        therapeutic multivitamin-minerals (Theragran-M) 1 Tab  1 Tab Oral Daily Pokhriyal, Megha, DO,RES   1 Tab at 01/03/22 0414    thiamine tablet 100 mg  100 mg Oral Daily Pokhriyal, Megha, DO,RES   100 mg at 01/03/22 [id number]       Electronically signed by Henrietta DineParekh, Parth Jateen, MD at 01/03/2022 12:21 PM EST

## 2022-01-03 NOTE — Progress Notes (Signed)
Formatting of this note is different from the original.  Date: 01/03/2022  Time: 7:24 AM    Scan Patient Arm Band: [id number]    Primary Dx: Pancreatitis [K85.90]  Patient LOS: 5  Expected Discharge Date: Jan 04, 2022      Plan of Care:    IV Drips:     All drips have been reviewed    CVL / PIV Lines:     Patient Lines/Drains/Airways Status       Active Peripheral Venous Line / Central Venous Line / Arterial Line / Left Atrial Line / Epidural Line / Airway / Subcutaneous Line / Drain / PIV Line / Intraosseous Line       Name Placement date Placement time Site Days Last dressing change    PIV: 12/29/21 1536 20 gauge Forearm Anterior;Left;Proximal 12/29/21  1536  Forearm  4     PIV: 12/31/21 1531 22 gauge Forearm Anterior;Proximal;Right 12/31/21  1531  Forearm  2                Dressing Dates , CHG Bath needs and patient presentation has been reviewed.    ACTIVE Foley Orders :     Foley 8-step care has been reviewed    Foley INDICATION has been reviewed     ACTIVE TELE order     Active Telemetry Orders (From admission, onward)      None         Tele Orders have been confirmed and reviewed.     ACTIVE Wounds     Patient Lines/Drains/Airways Status       Active Wound / Wound Vac Therapy / Rash / Burns       Name Placement date Placement time Site Days Additional Info Last dressing change    Incision: Abdomen 02/10/21 1051 02/10/21  1051  Abdomen (Comment) Trocar Sites X 4  326 Pre-existing: No                 Wound care Orders have been confirmed and reviewed.   LDA & Skin-man have been updated and reviewed for accuracy    Labs     Recent Labs     01/03/22  0138 01/02/22  0319 01/01/22  0331   NA 139 139 138   POTASSIUM 4.1 3.8 3.7   CREAT 0.4* 0.5 0.5   GLUCOSE 100* 95 96   CALCIUM 9.5 8.8 8.6   MAGNESIUM 1.9 1.8 1.7   PHOSPHORUS 4.9* 4.9* 4.3     H/H:   Lab Results   Component Value Date    HEMOGLOBIN 9.6 (L) 01/03/2022    HCT 27.4 (L) 01/03/2022     CPK: No results found for: "CPK"  Labs and Electrolyte  Replacement protocols have been confirmed and reviewed.     Restraints     Restraint Orders have been reviewed for accuracy.     Communication Whiteboard updated, Bedside shift Report Completed. The Above orders and Lab results have been reviewed with off going nurse by Earley Brooke, RN          Electronically signed by Earley Brooke, RN at 01/03/2022  7:26 AM EST

## 2022-01-03 NOTE — Care Plan (Signed)
Formatting of this note might be different from the original.    Problem: Adult Inpatient Plan of Care  Goal: Plan of Care Review  Outcome: Progressing  Goal: Patient-Specific Goal (Individualized)  Outcome: Progressing  Goal: Absence of Hospital-Acquired Illness or Injury  Outcome: Progressing  Intervention: Identify and Manage Fall Risk  Flowsheets (Taken 01/03/2022 0852)  Safety Promotion/Fall Prevention:   assistive device/personal items within reach   clutter-free environment maintained   lighting adjusted   mobility aid in reach   nonskid shoes/slippers when out of bed   safety round/check completed  Intervention: Prevent Skin Injury  Flowsheets  Taken 01/03/2022 0852 by Earley Brooke, RN  Body Position: position changed independently  Taken 01/02/2022 2056 by Joylene Igo, LPN  Skin Protection: tubing/devices free from skin contact  Intervention: Prevent and Manage VTE (Venous Thromboembolism) Risk  Flowsheets (Taken 01/02/2022 2056 by Joylene Igo, LPN)  VTE Prevention/Management:   SCDs (sequential compression devices) off   patient refused intervention  Intervention: Prevent Infection  Flowsheets (Taken 01/02/2022 2056 by Joylene Igo, LPN)  Infection Prevention:   single patient room provided   rest/sleep promoted   personal protective equipment utilized   hand hygiene promoted   equipment surfaces disinfected   environmental surveillance performed  Goal: Optimal Comfort and Wellbeing  Outcome: Progressing  Intervention: Monitor Pain and Promote Comfort  Flowsheets (Taken 01/03/2022 0514 by Joylene Igo, LPN)  Pain Management Interventions:   care clustered   pain management plan reviewed with patient/caregiver   pain medication given   quiet environment facilitated  Intervention: [place] (Taken 01/03/2022 0852)  Trust Relationship/Rapport:   care explained   choices provided   thoughts/feelings acknowledged   reassurance provided   questions  encouraged   questions answered  Goal: Readiness for Transition of Care  Outcome: Progressing    Problem: Fall Prevention  Goal: Prevent/Manage Accidental Injury (Falls)  Description: Consider requesting a pharmacologic review as needed.  Consider asking the MD to consult PT / OT for an evaluation.  Outcome: Progressing    Problem: Pain Acute  Goal: Optimal Pain Control and Function  Outcome: Progressing  Intervention: Develop Pain Management Plan  Flowsheets (Taken 01/03/2022 0514 by Joylene Igo, LPN)  Pain Management Interventions:   care clustered   pain management plan reviewed with patient/caregiver   pain medication given   quiet environment facilitated  Intervention: Prevent or Manage Pain  Flowsheets (Taken 01/03/2022 0852)  Medication Review/Management: medications reviewed  Intervention: [place] (Taken 01/03/2022 0852)  Diversional Activities:   smartphone   television    Electronically signed by Earley Brooke, RN at 01/03/2022 10:39 AM EST

## 2022-01-03 NOTE — Progress Notes (Signed)
Formatting of this note is different from the original.  Date: 01/03/2022  Time: 7:13 PM    Scan Patient Arm Band: [id number]    Primary Dx: Pancreatitis [K85.90]  Patient LOS: 5  Expected Discharge Date: Jan 04, 2022      Plan of Care:    IV Drips:     All drips have been reviewed    CVL / PIV Lines:     Patient Lines/Drains/Airways Status       Active Peripheral Venous Line / Central Venous Line / Arterial Line / Left Atrial Line / Epidural Line / Airway / Subcutaneous Line / Drain / PIV Line / Intraosseous Line       Name Placement date Placement time Site Days Last dressing change    PIV: 12/29/21 1536 20 gauge Forearm Anterior;Left;Proximal 12/29/21  1536  Forearm  5     PIV: 12/31/21 1531 22 gauge Forearm Anterior;Proximal;Right 12/31/21  1531  Forearm  3                Dressing Dates , CHG Bath needs and patient presentation has been reviewed.    ACTIVE Foley Orders :     Foley 8-step care has been reviewed    Foley INDICATION has been reviewed     ACTIVE TELE order     Active Telemetry Orders (From admission, onward)      None         Tele Orders have been confirmed and reviewed.     ACTIVE Wounds     Patient Lines/Drains/Airways Status       Active Wound / Wound Vac Therapy / Rash / Burns       Name Placement date Placement time Site Days Additional Info Last dressing change    Incision: Abdomen 02/10/21 1051 02/10/21  1051  Abdomen (Comment) Trocar Sites X 4  327 Pre-existing: No                 Wound care Orders have been confirmed and reviewed.   LDA & Skin-man have been updated and reviewed for accuracy    Labs     Recent Labs     01/03/22  0138 01/02/22  0319 01/01/22  0331   NA 139 139 138   POTASSIUM 4.1 3.8 3.7   CREAT 0.4* 0.5 0.5   GLUCOSE 100* 95 96   CALCIUM 9.5 8.8 8.6   MAGNESIUM 1.9 1.8 1.7   PHOSPHORUS 4.9* 4.9* 4.3     H/H:   Lab Results   Component Value Date    HEMOGLOBIN 9.6 (L) 01/03/2022    HCT 27.4 (L) 01/03/2022     CPK: No results found for: "CPK"  Labs and Electrolyte  Replacement protocols have been confirmed and reviewed.     Restraints     Restraint Orders have been reviewed for accuracy.     Communication Whiteboard updated, Bedside shift Report Completed. The Above orders and Lab results have been reviewed with off going nurse by Joylene Igo, LPN          Electronically signed by Joylene Igo, LPN at 14/78/2956  2:13 PM EST

## 2022-01-03 NOTE — Progress Notes (Signed)
Formatting of this note might be different from the original.  I, Lilyan Gilford, MD  have interviewed and examined Rhonda Hammond and discussed his care with the resident team. I have reviewed the chart on Rhonda Hammond and I agree with the documentation as charted. Pain improving.    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

## 2022-01-03 NOTE — Progress Notes (Signed)
Formatting of this note is different from the original.  Images from the original note were not included.      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 is a 31 y.o. female with a past medical history of chronic alcoholic pancreatitis who was admitted for acute on chronic alcoholic pancreatitis with an increasing area of walled-off necrosis.    Plan for today:  - Per GI: procedure tomorrow; NPO tonight   - Schedule Tylenol, IV Dilaudid PRN for pain  - IV Zofran for nausea    Acute on chronic alcoholic pancreatitis c/b walled-off necrosis, improving  - CT abd/pelvis showing an interval increase in size of homogenous hypodense pancreatic head cystic lesion now 4.8cm representing walled-off necrosis  - afebrile without leukocytosis - likely not infected and no need for antibiotics at this time  []  Pending evaluation by Dr. on Tues 11/19  []  Will likely need endoscopic drainage of walled-off necrosis  []  IV PPI BID  []  Scheduled Tylenol q6h, IV Dilaudid 0.5-1mg  q4h PRN for pain  []  IV Zofran for nausea  []  Regular diet  []  GI following, appreciate recommendations    Iron deficiency anemia, stable  - [place] iron deficit 2.2g  []  IV Venofer 300mg  x3 doses completed  []  Transition to iron tabs every other day    Heavy alcohol use  - no hx of withdrawals  []  CIWA with PRN Ativan  []  MV, thiamine, folate    Barriers to discharge: pending GI recommendations    Global Issues  DVT Prophylaxis: SQH  Diet: Regular  Code Status: Full  Physical Therapy: -  Occupational Therapy: -  PCP: None No  PCP PCP, MD    Further management for 11-19-2003 will be discussed on rounds with my attending    Subjective:     Events of the last 24 hours   No acute issues overnight  Tolerated     Review of Systems:      Review of Systems   Constitutional:  Negative for chills and fever.   HENT:  Negative for hearing loss and tinnitus.    Respiratory:  Negative for cough, hemoptysis and sputum production.    Cardiovascular:  Negative for chest pain, palpitations, orthopnea and claudication.   Gastrointestinal:  Negative for abdominal pain, constipation, diarrhea, nausea and vomiting.   Genitourinary:  Negative for dysuria and urgency.   Musculoskeletal:  Negative for neck pain.   Neurological:  Negative for dizziness, tremors, sensory change, speech change, focal weakness, seizures, weakness and headaches.   Psychiatric/Behavioral:  Negative for depression, memory loss and suicidal ideas.      Objective:      BP 134/69   Pulse 85   Temp 98.4 F (36.9 C)   Resp 16   Ht 5\' 5"  (1.651 m)   Wt 96.5 kg (212 lb 11.9 oz)   SpO2 99%   BMI 35.40 kg/m      Temp (24hrs),  Avg:98.5 F (36.9 C), Min:98.3 F (36.8 C), Max:98.8 F (37.1 C)      Intake/Output Summary (Last 24 hours) at 01/03/2022 1620  Last data filed at 01/03/2022 0418  Gross per 24 hour   Intake 720 ml   Output --   Net 720 ml     Net IO Since Admission: 2,522.23 mL [01/03/22 1620]     Physical Exam  HENT:      Right Ear: Tympanic membrane normal.      Nose: Nose normal.      Mouth/Throat:      Mouth: Mucous membranes are moist.   Cardiovascular:      Rate and Rhythm: Normal rate and regular rhythm.      Heart sounds: No murmur heard.  Pulmonary:      Effort: Pulmonary effort is normal. No respiratory distress.      Breath sounds: Normal breath sounds. No wheezing.   Abdominal:      General: Abdomen is flat. There is no distension.      Palpations: Abdomen is soft.      Tenderness: There is no abdominal tenderness.   Musculoskeletal:         General: No  swelling.   Skin:     General: Skin is warm.      Coloration: Skin is not jaundiced or pale.   Neurological:      General: No focal deficit present.      Mental Status: She is alert and oriented to person, place, and time.      Cranial Nerves: No cranial nerve deficit.   Psychiatric:         Mood and Affect: Mood normal.     Current Inpatient Meds   acetaminophen, 650 mg, Q6H  ferrous sulfate, 325 mg, Every Other Day  folic acid, 1 mg, Daily  heparin, 5,000 Units, Q8H  pantoprazole (Protonix) 40 mg in sodium chloride 0.9 % 10 mL syringe, 40 mg, BID  therapeutic multivitamin-minerals, 1 Tab, Daily  thiamine, 100 mg, Daily        Laboratory Results     CBC    Recent Labs     01/03/22  0138 01/02/22  0319 01/01/22  0331   WBC 6.6 5.1 5.1   HEMOGLOBIN 9.6* 8.4* 8.0*   HCT 27.4* 24.0* 23.6*   MCV 70* 71* 71*   MCH 25* 25* 24*   MCHC 35 35 34   RDW 18.8* 18.2* 18.3*   PLATELET 238 235 221   MPV 9.7 10.0 10.4   SEGS 53 43 52   LYMPHOCYTES 33 43 34   MONOS 11 11 11    EOS 2 3 2    BASOS 0 0 0     Basic Metabolic Profile   Recent Labs     01/03/22  0138 01/02/22  0319 01/01/22  0331   NA 139 139 138   POTASSIUM 4.1 3.8 3.7   CHLORIDE 103 104 103   CO2 22 23 23    BUN 6 4* 4*   CREAT 0.4* 0.5 0.5   GLUCOSE 100* 95 96   CALCIUM 9.5 8.8 8.6   MAGNESIUM 1.9 1.8 1.7   PHOSPHORUS 4.9* 4.9* 4.3       Imaging     EKG 12-LEAD    (Results Pending)   EKG 12 LEAD UNIT PERFORMED    (Results Pending)     01/04/22. 01/03/22, MD  [facility] Internal Medicine PGY-2  Pager: [phone]      Electronically  signed by Lilyan Gilford, MD at 01/04/2022  2:45 PM EST

## 2022-01-04 NOTE — Anesthesia Post-Procedure Evaluation (Signed)
Formatting of this note might be different from the original.  Images from the original note were not included.      Post-Anesthesia PACU Note    [name]  is recovering from her anesthesia.     Her most recent vital signs are: Temp: 36.6 C (97.9 F), Heart Rate: 105, Resp: 18, BP: 128/87,  SpO2: 95 %     Her airway is patent.  She  is awake and can follow commands after her anesthetic.  Her pain is adequately controlled. Her vital signs indicate adequate postoperative hydration.  PONV is present and being treated.    I have provided indicated postoperative care for [name].    Sherron Ales, MD     [id number]  Electronically signed by Sherron Ales, MD at 01/04/2022  4:19 PM EST

## 2022-01-04 NOTE — Progress Notes (Signed)
Formatting of this note is different from the original.  Date: 01/04/2022  Time: 6:56 PM    Scan Patient Arm Band: [id number]    Primary Dx: Pancreatitis [K85.90]  Patient LOS: 6  Expected Discharge Date: Jan 04, 2022      Plan of Care:    IV Drips:   LR, Last Rate: 30 mL/hr (01/04/22 1006)    All drips have been reviewed    CVL / PIV Lines:     Patient Lines/Drains/Airways Status       Active Peripheral Venous Line / Central Venous Line / Arterial Line / Left Atrial Line / Epidural Line / Airway / Subcutaneous Line / Drain / PIV Line / Intraosseous Line       Name Placement date Placement time Site Days Last dressing change    PIV: 12/29/21 1536 20 gauge Forearm Anterior;Left;Proximal 12/29/21  1536  Forearm  6     PIV: 12/31/21 1531 22 gauge Forearm Anterior;Proximal;Right 12/31/21  1531  Forearm  4                Dressing Dates , CHG Bath needs and patient presentation has been reviewed.    ACTIVE Foley Orders :     Foley 8-step care has been reviewed    Foley INDICATION has been reviewed     ACTIVE TELE order     Active Telemetry Orders (From admission, onward)      None         Tele Orders have been confirmed and reviewed.     ACTIVE Wounds     Patient Lines/Drains/Airways Status       Active Wound / Wound Vac Therapy / Rash / Burns       Name Placement date Placement time Site Days Additional Info Last dressing change    Incision: Abdomen 02/10/21 1051 02/10/21  1051  Abdomen (Comment) Trocar Sites X 4  328 Pre-existing: No                 Wound care Orders have been confirmed and reviewed.   LDA & Skin-man have been updated and reviewed for accuracy    Labs     Recent Labs     01/04/22  0109 01/03/22  0138 01/02/22  0319   NA 136 139 139   POTASSIUM 4.2 4.1 3.8   CREAT 0.5 0.4* 0.5   GLUCOSE 91 100* 95   CALCIUM 9.7 9.5 8.8   MAGNESIUM 1.8 1.9 1.8   PHOSPHORUS 4.5 4.9* 4.9*     H/H:   Lab Results   Component Value Date    HEMOGLOBIN 9.4 (L) 01/04/2022    HCT 27.7 (L) 01/04/2022     CPK: No results found  for: "CPK"  Labs and Electrolyte Replacement protocols have been confirmed and reviewed.     Restraints     Restraint Orders have been reviewed for accuracy.     Communication Whiteboard updated, Bedside shift Report Completed. The Above orders and Lab results have been reviewed with off going nurse by Joylene Igo, LPN          Electronically signed by Joylene Igo, LPN at 27/25/3664  4:03 PM EST

## 2022-01-04 NOTE — Anesthesia Pre-Procedure Evaluation (Signed)
Formatting of this note is different from the original.  01/04/2022     Preanesthesia  Consult     Rhonda Hammond is a 31 y.o. female.     Procedure Summary       Date/Time: 01/04/22 1330    Procedure: EGD, WITH ENDOSCOPIC ULTRASOUND AXIOS STENT INSERTION (Mouth)    Anesthesia type: General Anesthesia    Pre-op diagnosis: Pancreatits    Location: [place] / [place] [facility] LOC    Surgeons: Golda Acre, MD         Code Status: FULL      Plan:  GA with inhalation agents and ETT    Position:     Antiplatelet/Anticoagulants:     ASA Physical Status Classification:    PS-3 Severe systemic disease that results in functional limitations    Airway  Modified Mallampati Score:  II  Airway Evaluation:  Adequate mouth opening, neck ROM, thyromental distance  Dentition:  ANES DENTITION: lip, nose and tongue ring.    Beta Blocker:    Patient not on beta-blocker prior to admission.     Personal and Family Anesthesia History:    No anesthetic complications    Smoking Status:  Smoker  Patient received smoking cessation counseling prior to the day of surgery. Patient abstained from smoking prior to anesthesia on the day of surgery.    NPO Status:  Confirmed NPO    Review of Systems:    Review of systems per medical, surgical, and social history  No angina symptoms recently  Breathing today is "normal" per patient  No acid reflux symptoms when NPO  Ambulatory  Pulmonary: positive for: Tobacco abuse  Gastrointestinal: positive for: Obesity (pancreatitis)  Other systems positive for: ETOH abuse    Good functional capacity MET level 4 or more:     Physical Exam:    Heart: Regular rate/rhythm   Lungs: Clear to auscultation    EKG:  EKG normal    CXR: No CXR    Pregnancy   Pregnant now?: Pregnancy Test negative  No LMP recorded.  PREGNANCY URINE       Date                     Value               Ref Range           Status                11/25/2021               Negative            Negative            Final             ----------    No outpatient medications have been marked as taking for the 12/29/21 encounter [facility] Encounter).     [MAR Hold] acetaminophen (TylenoL) tablet 650 mg  [MAR Hold] albuterol (ProventiL, Ventolin) 2.5 mg /3 mL (0.083 %) nebulizer solution 2.5 mg  [MAR Hold] calcium gluconate 2 gram/100 mL in 141m NS IVPB 2 g  [MAR Hold] calcium gluconate in 534mNS IVPB 1 g  [MAR Hold] diphenhydrAMINE (BenadryL) capsule 25 mg  [MAR Hold] ferrous sulfate (Feosol) tablet 325 mg  flumazeniL (Romazicon) injection 0.2 mg  [MAR Hold] folic acid (Folvite) tablet 1 mg  [Held by provider] heparin injection 5,000 Units  [MAR Hold] HYDROmorphone (Dilaudid) injection 0.5  mg   Or  [MAR Hold] HYDROmorphone (Dilaudid) injection 1 mg  Lactated Ringers (LR) infusion  lidocaine (Xylocaine) 1% injection 0.1 mL  [MAR Hold] LORazepam (Ativan) tablet 2 mg   Or  [MAR Hold] LORazepam (Ativan) injection 2 mg  [MAR Hold] magnesium oxide (Mag-Ox) tablet 400-800 mg  [MAR Hold] magnesium sulfate 1g/D5W 1100m ivpb 1 g  [MAR Hold] magnesium sulfate 2g/563m(4%) in water IVPB 2 g  naloxone (Narcan) injection 0.1-0.2 mg  [MAR Hold] NS Flush injection 10 mL  [MAR Hold] ondansetron (PF) (Zofran) injection 4 mg  [MAR Hold] pantoprazole (Protonix) 40 mg in sodium chloride 0.9 % 10 mL syringe  [MAR Hold] PHOS-NAK 280-160-250 mg powder 2 Packet  [MAR Hold] potassium chloride (Klor-Con) packet 20-60 mEq  [MAR Hold] potassium chloride ER (K-Dur;Klor-Con) tablet 20-60 mEq  [MAR Hold] potassium chloride ER (K-Dur;Klor-Con) tablet 20-60 mEq  [MAR Hold] potassium chloride in water (KCL) 10 mEq/100 mL IVPB 10 mEq  [MAR Hold] potassium chloride in water 20 mEq/100 mL infusion 20 mEq  [MAR Hold] potassium chloride in water 20 mEq/50 mL infusion 20 mEq  [MAR Hold] potassium phosphate (K Phos Neutral/Phospha Neutral) 250 mg tablet 2 Tab  [MAR Hold] sodium phosphate 12 mmol in D5W 100 mL IVPB  [MAR Hold] sodium phosphate 18 mmol in D5W 100 mL IVPB  [MAR Hold]  sodium phosphate 21 mmol in D5W 100 mL IVPB  [MAR Hold] sodium phosphate 6 mmol in D5W 50 mL IVPB  [MAR Hold] sodium phosphate 9 mmol in D5W 100 mL IVPB  [MAR Hold] therapeutic multivitamin-minerals (Theragran-M) 1 Tab  [MAR Hold] thiamine tablet 100 mg        Allergies   Allergen Reactions   ? Droperidol neurological reaction     Pt states "face twitching and couldn't stop legs from moving"     Past Medical History:   Diagnosis Date   ? Alcohol-induced acute pancreatitis     no longer drinks EtOH regularly   ? Class 1 obesity with body mass index (BMI) of 32.0 to 32.9 in adult    ? Gallstones     per pt report       Social History     Tobacco Use   ? Smoking status: Every Day     Types: Cigarettes   ? Smokeless tobacco: Never   Substance Use Topics   ? Alcohol use: Not on file       Past Surgical History:   Procedure Laterality Date   ? LAP CHOLECYSTECTOMY N/A 02/10/2021    Procedure: CHOLECYSTECTOMY, LAPAROSCOPIC, WITH CHOLANGIOGRAM;  Surgeon: Soult, Alexa P, MD       BP 118/62   Pulse 73   Temp 36.3 C (97.4 F)   Resp 13   Ht _0  (1.651 m)   Wt 95.3 kg (210 lb)   SpO2 96%   BMI 34.95 kg/m       Other Studies:    Laboratory Studies:   CBC   WBC   Date/Time Value Ref Range Status   01/04/2022 01:09 AM 5.8 4.0 - 11.0 K/uL Final     RBC   Date/Time Value Ref Range Status   01/04/2022 01:09 AM 3.89 3.80 - 5.20 M/uL Final     HGB   Date/Time Value Ref Range Status   01/04/2022 01:09 AM 9.4 (L) 11.7 - 15.5 g/dL Final     HCT   Date/Time Value Ref Range Status   01/04/2022 01:09 AM 27.7 (L) 35.1 - 46.5 %  Final     Platelet   Date/Time Value Ref Range Status   01/04/2022 01:09 AM 242 140 - 440 K/uL Final         Electrolytes / Glucose     Sodium   Date/Time Value Ref Range Status   01/04/2022 01:09 AM 136 133 - 145 mmol/L Final     Potassium   Date/Time Value Ref Range Status   01/04/2022 01:09 AM 4.2 3.5 - 5.5 mmol/L Final     Chloride   Date/Time Value Ref Range Status   01/04/2022 01:09 AM 103 98 - 110 mmol/L  Final     CO2   Date/Time Value Ref Range Status   01/04/2022 01:09 AM 22 20 - 32 mmol/L Final     BUN   Date/Time Value Ref Range Status   01/04/2022 01:09 AM 8 6 - 22 mg/dL Final     Creatinine   Date/Time Value Ref Range Status   01/04/2022 01:09 AM 0.5 0.5 - 1.2 mg/dL Final     Glucose   Date/Time Value Ref Range Status   01/04/2022 01:09 AM 91 70 - 99 mg/dL Final          Coagulation     No results found for: "PT", "INR", "APTT"      Liver Function   Albumin   Date/Time Value Ref Range Status   12/29/2021 03:22 PM 4.4 3.5 - 5.0 g/dL Final     Total Protein   Date/Time Value Ref Range Status   12/29/2021 03:22 PM 8.4 (H) 6.4 - 8.3 g/dL Final     Bilirubin Direct   Date/Time Value Ref Range Status   12/29/2021 03:22 PM <0.2 0.0 - 0.3 mg/dL Final     Bilirubin Total   Date/Time Value Ref Range Status   12/29/2021 03:22 PM 0.3 0.2 - 1.2 mg/dL Final     SGPT (ALT)   Date/Time Value Ref Range Status   12/29/2021 03:22 PM 6 5 - 40 U/L Final     SGOT (AST)   Date/Time Value Ref Range Status   12/29/2021 03:22 PM 20 10 - 37 U/L Final     Alkaline Phosphatase   Date/Time Value Ref Range Status   12/29/2021 03:22 PM 62 25 - 115 U/L Final     Lipase   Date/Time Value Ref Range Status   12/29/2021 03:22 PM 33 7 - 60 U/L Final       Last Bedside Glucose   No results for input(s): "GLUPOC" in the last 48 hours.      Arterial Blood Gases   No results found for: "PH1", "PCO2", "PO2", "HCO3", "BEBLOOD", "O2SATA", "TCO2"      I have informed Ms. [place] or her guardian of the nature and purpose of the type of anesthesia, the reasonable alternative anesthetic methods, pertinent foreseeable risks involved and the possibility of complications.  I have explained that an alternative form of anesthesia may be required by unexpected conditions arising before or during the procedure.   Ms. Clare or her guardian understands that general anesthesia may be required for her safety or comfort.  Questions have been answered to the  satisfaction of Ms. Z[place]ro[place]  or her guardian who accepts the risks and agrees to proceed as planned. The above anesthetic review of  Ms. Joslin's medical history,  her exam, her tests, her assessment,  subsequent anesthetic plan and consent have been accomplished pre-procedure.    Judson Roch, MD  Electronically signed by Judson Roch, MD at 01/04/2022 10:33 AM EST

## 2022-01-04 NOTE — Progress Notes (Signed)
Formatting of this note might be different from the original.  I, Rhonda Gilford, MD  have interviewed and examined Rhonda Hammond and discussed his care with the resident team. I have reviewed the chart on Rhonda Hammond and I agree with the documentation as charted. Tol PO yesterday. Abd pain improved.    BP 118/62   Pulse 73   Temp 97.4 F (36.3 C)   Resp 13   Ht 5\' 5"  (1.651 m)   Wt 95.3 kg (210 lb)   SpO2 96%   BMI 34.95 kg/m     NAD  A/A    A/P    Acute on chronic pancreatitis with pseudocyst and necrosis  Acute on chronic anemia  Pain related to #1    EUS today per Dr. Donnie Mesa - timing of d/c depends on findings there    [name], MD  [facility] Internal Medicine  [phone] [facility] (o)  Electronically signed by Rhonda Gilford, MD at 01/04/2022  2:42 PM EST

## 2022-01-04 NOTE — Progress Notes (Signed)
Formatting of this note is different from the original.  Images from the original note were not included.  Medical Nutrition Therapy  Nutrition Assessment    Level of Risk: Moderate Risk Initial Assessment  Referral Source: Nutrition Screening Protocol    Malnutrition Diagnosis: Not enough criteria met for diagnosis at this time    INTERVENTION/RECOMMENDATIONS   Continue MVI+m,   Advance diet as medically able,   Encourage oral intake, focus on lean protein intake to support recovery  Home health evaluation, consider rehab services for ETOH abuse    Malnutrition Diagnosis Criteria   Not enough criteria met for diagnosis at this time      Assessment     No pt contact made for this assessment. Chart has been reviewed. As needed, case has been discussed w/ physician or RN.    31 y.o. female with a past medical history of chronic alcoholic pancreatitis who was admitted for acute on chronic alcoholic pancreatitis with an increasing area of walled-off necrosis.   EGD w/ stent placement for pancreatic necrosis, abd discomfort w/ N/V, is able to meet estimated needs based on intakes prior to diet change.    Nutrition will continue to follow    Diet Order: Regular and NPO  PO Intakes: 75%  Oral Supplements: None ordered at this time     Height/Weight Status: Estimated body mass index is 34.95 kg/m as calculated from the following:    Height as of this encounter: _0  (1.651 m).    Weight as of this encounter: 95.3 kg (210 lb).  Weight trends: Trending down  Wt Readings from Last 10 Encounters:   01/04/22 95.3 kg (210 lb)   12/28/21 97.5 kg (215 lb)   12/22/21 96.6 kg (213 lb)   12/15/21 96.6 kg (213 lb)   12/14/21 96.6 kg (213 lb)   12/01/21 98.9 kg (218 lb 1.6 oz)   11/25/21 96.5 kg (212 lb 11.2 oz)   11/22/21 93.4 kg (206 lb)   11/16/21 94.9 kg (209 lb 3.2 oz)   10/04/21 92.1 kg (203 lb)     Patient Vitals for the past 120 hrs:   Weight   12/31/21 0407 100 kg (220 lb 7.4 oz)   01/01/22 0324 98.9 kg (218 lb 0.6 oz)    01/02/22 0321 97.3 kg (214 lb 9.6 oz)   01/03/22 0400 96.5 kg (212 lb 11.9 oz)   01/04/22 0449 95.6 kg (210 lb 12.2 oz)   01/04/22 0949 95.3 kg (210 lb)    Fluid status: Net IO Since Admission: 2,882.23 mL [01/04/22 1447]  -   GI Status: Last Bowel Movement: 01/02/22     Meds:  [MAR Hold] acetaminophen (TylenoL) tablet 650 mg  [MAR Hold] albuterol (ProventiL, Ventolin) 2.5 mg /3 mL (0.083 %) nebulizer solution 2.5 mg  [MAR Hold] calcium gluconate 2 gram/100 mL in 162m NS IVPB 2 g  [MAR Hold] calcium gluconate in 575mNS IVPB 1 g  [MAR Hold] diphenhydrAMINE (BenadryL) capsule 25 mg  [MAR Hold] ferrous sulfate (Feosol) tablet 325 mg  [MAR Hold] folic acid (Folvite) tablet 1 mg  [Held by provider] heparin injection 5,000 Units  [MAR Hold] HYDROmorphone (Dilaudid) injection 0.5 mg   Or  [MAR Hold] HYDROmorphone (Dilaudid) injection 1 mg  Lactated Ringers (LR) infusion  lidocaine (Xylocaine) 1% injection 0.1 mL  [MAR Hold] magnesium oxide (Mag-Ox) tablet 400-800 mg  [MAR Hold] magnesium sulfate 1g/D5W 10017mvpb 1 g  [MAR Hold] magnesium sulfate 2g/49m68m%) in water IVPB  2 g  [MAR Hold] NS Flush injection 10 mL  [MAR Hold] ondansetron (PF) (Zofran) injection 4 mg  [MAR Hold] pantoprazole (Protonix) 40 mg in sodium chloride 0.9 % 10 mL syringe  [MAR Hold] PHOS-NAK 280-160-250 mg powder 2 Packet  [MAR Hold] potassium chloride (Klor-Con) packet 20-60 mEq  [MAR Hold] potassium chloride ER (K-Dur;Klor-Con) tablet 20-60 mEq  [MAR Hold] potassium chloride ER (K-Dur;Klor-Con) tablet 20-60 mEq  [MAR Hold] potassium chloride in water (KCL) 10 mEq/100 mL IVPB 10 mEq  [MAR Hold] potassium chloride in water 20 mEq/100 mL infusion 20 mEq  [MAR Hold] potassium chloride in water 20 mEq/50 mL infusion 20 mEq  [MAR Hold] potassium phosphate (K Phos Neutral/Phospha Neutral) 250 mg tablet 2 Tab  [MAR Hold] sodium phosphate 12 mmol in D5W 100 mL IVPB  [MAR Hold] sodium phosphate 18 mmol in D5W 100 mL IVPB  [MAR Hold] sodium phosphate 21  mmol in D5W 100 mL IVPB  [MAR Hold] sodium phosphate 6 mmol in D5W 50 mL IVPB  [MAR Hold] sodium phosphate 9 mmol in D5W 100 mL IVPB  [MAR Hold] therapeutic multivitamin-minerals (Theragran-M) 1 Tab  [MAR Hold] thiamine tablet 100 mg    esmoloL (Brevibloc) injection  Lactated Ringers (LR) infusion  midazolam (Versed) injection     Labs:  Basic Metabolic Profile  Lab Results   Component Value Date    NA 136 01/04/2022    POTASSIUM 4.2 01/04/2022    CHLORIDE 103 01/04/2022    CO2 22 01/04/2022    BUN 8 01/04/2022    CREAT 0.5 01/04/2022    GLUCOSE 91 01/04/2022    CALCIUM 9.7 01/04/2022    MAGNESIUM 1.8 01/04/2022    PHOSPHORUS 4.5 01/04/2022     No results for input(s): "GLUPOC" in the last 48 hours.  Lab Results   Component Value Date    ALBUMIN 4.4 12/29/2021    TOTPR 8.4 (H) 12/29/2021    SGOTAST 20 12/29/2021    SGPTALT 6 12/29/2021    ALKPHOS 62 12/29/2021    AGRAT 1.1 12/29/2021    BILIT 0.3 12/29/2021    BILID <0.2 12/29/2021    GLOBULIN 4.0 12/29/2021     No results found for: "AMMVEN"  No results found for: "AMMONIA"  Lab Results   Component Value Date/Time    TRIGLYCERIDE 48 12/30/2021 07:57 AM     No results found for: "HGBA1C"  Lab Results   Component Value Date    VITD25OH 12.2 (L) 09/02/2007     Lab Results   Component Value Date    FOLATE 5.70 12/29/2021     Lab Results   Component Value Date    IZTIWPYK99 833 12/30/2021         Monitoring & Evaluation   MNT Goal: Patient will consume >85% of needs in the next 5-7 days    Discharge Recommendations   Home health nutrition evaluation and monitoring    For nutrition concerns on weekends/nights, place IP Nutrition Consult or page weekend RD via hospital operator.     Fernand Parkins, RD/ LDN  Inpatient/Outpatient Clinical Dietitian  Kidney/Pancreas Transplant  Contact via [facility] Paging or Secure Chat                Electronically signed by Fernand Parkins at 01/04/2022  2:50 PM EST

## 2022-01-04 NOTE — Progress Notes (Signed)
Formatting of this note might be different from the original.  Pt. Post surgery after arrival from PACU reporting 10/10 pain in abdomen. IV Dilaudid x2 given with minimal effectiveness. Pt. Found squatting to relieve pain. Dr/resident on call Mitzi Davenport contacted. Call cut short d/t dead phone battery. Attempts at reconnecting by phone failed. Night shift nurse Manuela Schwartz informed.  Electronically signed by Sherlie Ban, RN at 01/04/2022  8:19 PM EST

## 2022-01-04 NOTE — Progress Notes (Signed)
Formatting of this note is different from the original.  Images from the original note were not included.      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 female with a past medical history of chronic alcoholic pancreatitis who was admitted for acute on chronic alcoholic pancreatitis with an increasing area of walled-off necrosis. Dr. Eilleen Kempf to perform EUS with [facility] stent today 12/20.    Plan for today:  - EUS with Axios stent today  - Discontinued CIWA    Acute on chronic alcoholic pancreatitis c/b walled-off necrosis, improving  - CT abd/pelvis showing an interval increase in size of homogenous hypodense pancreatic head cystic lesion now 4.8cm representing walled-off necrosis  - afebrile without leukocytosis - likely not infected and no need for antibiotics at this time  []  EUS with Axios stent today 12/20 by Dr. 1/21  []  IV PPI BID  []  Scheduled Tylenol q6h, IV Dilaudid 0.5-1mg  q4h PRN for pain  []  IV Zofran for nausea  []  GI following, appreciate recommendations    Iron deficiency anemia, stable  - [place] iron deficit 2.2g s/p Venofer  []  Iron tabs every other day    Heavy alcohol use  - no hx of withdrawals  []  Discontinued CIWA - scoring 0, outside of withdrawal window  []  MV, thiamine, folate    Barriers to discharge: EUS with Axios stent    Global Issues  DVT Prophylaxis: SQH held  Diet: NPO since midnight  Code Status: Full  Physical Therapy: -  Occupational Therapy: -  PCP: None No PCP PCP, MD    Further management for Rhonda Hammond will be discussed on rounds with my  attending.    Subjective:     Events of the last 24 hours:  No acute events overnight. Abdominal pain is unchanged. Denies nausea and vomiting while eating well.    Review of Systems:      Positives are bolded.    Const: no fevers/chills  CV: no chest pain  Pulm: no Shortness of Breath  GI: no n/v, (+) abdominal pain  Neuro: no focal weakness/numbness/tingling    Current Inpatient Meds:     acetaminophen, 650 mg, Q6H  ferrous sulfate, 325 mg, Every Other Day  folic acid, 1 mg, Daily  heparin, 5,000 Units, Q8H  pantoprazole (Protonix) 40 mg in sodium chloride 0.9 % 10 mL syringe, 40 mg, BID  therapeutic multivitamin-minerals, 1 Tab, Daily  thiamine, 100 mg, Daily          Objective:      Temp (24hrs), Avg:98.3 F (36.8 C), Min:97.8 F (36.6 C), Max:98.6 F (37 C)    BP 112/53   Pulse 61   Temp 98.4 F (36.9 C)   Resp 18   Ht 5\' 5"  (1.651 m)   Wt 95.6 kg (210 lb 12.2 oz)   SpO2 94%   BMI 35.07 kg/m     Patient Vitals for the past 24  hrs:   Temp Heart Rate Pulse Resp BP BP Mean SpO2 Weight   01/04/22 0449 98.4 F (36.9 C) -- 61 18 112/53 76 MM HG 94 % 95.6 kg (210 lb 12.2 oz)   01/03/22 2054 98.1 F (36.7 C) -- 85 18 135/72 97 MM HG 97 % --   01/03/22 1600 97.8 F (36.6 C) 76 76 16 134/75 95 MM HG 99 % --   01/03/22 1205 98.4 F (36.9 C) 85 85 16 134/69 91 MM HG 99 % --   01/03/22 0756 98.6 F (37 C) -- 62 17 123/60 83 MM HG 99 % --     Intake/Output Summary (Last 24 hours) at 01/04/2022 0654  Last data filed at 01/04/2022 0449  Gross per 24 hour   Intake 660 ml   Output 300 ml   Net 360 ml     Positives are bolded.    Gen: Alert. NAD.  CV: RRR. Normal S1/S2. No m/r/g.  Pulm: CTAB. No crackles/wheezes/rhonchi. No accessory muscle use.  Abdomen: Soft, ND. Normal BS. Epigastric tenderness to palpation.  Ext: No c/c/e.  Neuro: A/A/O x4.  Skin: No petechiae. No rash. Warm/dry/intact.  Psych: Appropriate mood and affect.    Labwork and Ancillary Studies:     Labs/Studies reviewed:    Most Recent CBC:  Lab  Results   Component Value Date/Time    WBC 5.8 01/04/2022 01:09 AM    RBC 3.89 01/04/2022 01:09 AM    HEMOGLOBIN 9.4 (L) 01/04/2022 01:09 AM    HCT 27.7 (L) 01/04/2022 01:09 AM    MCV 71 (L) 01/04/2022 01:09 AM    MCH 24 (L) 01/04/2022 01:09 AM    MCHC 34 01/04/2022 01:09 AM    RDW 19.0 (H) 01/04/2022 01:09 AM    PLATELET 242 01/04/2022 01:09 AM    MPV 10.8 01/04/2022 01:09 AM     Most Recent BMP:  Lab Results   Component Value Date    NA 136 01/04/2022    POTASSIUM 4.2 01/04/2022    CHLORIDE 103 01/04/2022    CO2 22 01/04/2022    BUN 8 01/04/2022    CREAT 0.5 01/04/2022    GLUCOSE 91 01/04/2022    CALCIUM 9.7 01/04/2022    MAGNESIUM 1.8 01/04/2022    PHOSPHORUS 4.5 01/04/2022     Most Recent Liver Function:  Lab Results   Component Value Date/Time    SGOTAST 20 12/29/2021 03:22 PM    SGPTALT 6 12/29/2021 03:22 PM    ALKPHOS 62 12/29/2021 03:22 PM    BILIT 0.3 12/29/2021 03:22 PM    BILID <0.2 12/29/2021 03:22 PM    TOTPR 8.4 (H) 12/29/2021 03:22 PM    ALBUMIN 4.4 12/29/2021 03:22 PM     Most Recent Coagulation:  No results found for: "APTT", "PT", "INR"   Most Recent Lipid Panel:  Lab Results   Component Value Date/Time    TRIGLYCERIDE 48 12/30/2021 07:57 AM     Most Recent HbA1C/Microalbuminuria:  Lab Results   Component Value Date/Time    CREATUMGDL 63 05/26/2021 02:35 PM     Most Recent HIV/Hep C:  No results found for: "HIVABAG", "HIVINTERP", "HEPCAB"    Imaging/Studies    CT ABD/PELVIS-IV ONLY 01/19/2023  Impression    Addendum:    The pancreatic head collection appears to be surrounded with pancreatic tissue and therefore representing walled-off necrosis.    Interval increase in size of homogeneous hypodense pancreatic head cystic lesion 4.8 cm (previously 2.7 cm on  11/25/2021), the lesion has been present since 05/29/2020, presumably pseudocyst.  -Perhaps minimal increase peripancreatic stranding suggesting early recurrent pancreatitis.    [place] J Tyrrell, MD,RES  [facility] Internal Medicine PGY-1  Available Via Epic  Chat  01/04/2022, 6:54 AM    Electronically signed by Terrall Laity, MD at 01/04/2022  2:45 PM EST

## 2022-01-04 NOTE — Procedures (Signed)
Associated Order(s): ENDO, ESOPHAGOSCOPY  Formatting of this note might be different from the original.  [facility]  Patient Name: Rhonda Hammond  Procedure Date: 01/04/2022 2:11 PM  MRN: [id number]  Account Number: [id number]  Admit Type: Inpatient  Room: [facility] ENDO/GI D  Gender: Female  Date of Birth: Mar 31, 1990  Age: 31  Race: Black or African American  Specimens: Yes  Implants/Devices: Axios stent  Instrument Name: [id number]  Procedure:               Upper EUS  Providers:               Henrietta Dine, MD (Doctor)  Attending Participation: I personally performed the entire procedure.  Medicines:               General Anesthesia  Scope In:  Scope Out:  Complications:           No immediate complications.  Pre-Operative Diagnosis:Abnormal abdominal/pelvic CT scan    Procedure:    Pre-Anesthesia Assessment:  Prior to the procedure, a History and Physical was performed, and patient medications and allergies were reviewed. The patient's tolerance of previous anesthesia was also reviewed. The risks and benefits of the procedure and the sedation options and risks were discussed with the patient. All questions were answered, and informed consent was obtained. Prior Anticoagulants: The patient has taken no anticoagulant or antiplatelet agents. ASA Grade Assessment: III - A patient with severe systemic disease. After reviewing the risks and benefits, the patient was deemed in satisfactory condition to undergo the procedure. After obtaining informed consent, the endoscope was passed under direct vision. Throughout the procedure, the patient's blood pressure, pulse, and oxygen saturations were monitored continuously. The [id number]-Endoscopy was introduced through the mouth, and advanced to the duodenum for ultrasound examination from the esophagus, stomach and duodenum. The upper EUS was accomplished without difficulty. The patient tolerated the procedure well.    Findings:  ENDOSONOGRAPHIC FINDING: : The  region of the celiac plexus and celiac ganglia was visualized and showed no sign of significant endosonographic abnormality. An anechoic lesion suggestive of a cyst was identified in the pancreatic head. It is not in obvious communication with the pancreatic duct. The lesion measured 35 mm in maximal cross-sectional diameter. There was a single compartment without septae. The outer wall of the lesion was thin. There was no associated mass. There was no internal debris within the fluid-filled cavity. The decision was made to create a cystogastrostomy using the AXIOS stent system. Once an appropriate position in the stomach was identified, the common wall between the stomach and the cyst was interrogated utilizing color Doppler imaging to identify interposed vessels. The stomach wall and the cyst were punctured under endosonographic guidance with the 19 gauge needle. Bile fluid was aspirated. The AXIOS stent and electrocautery device was introduced through the working channel. In the identified position, current was applied to the cautery tip and then used to create the ostomy. The AXIOS device was advanced into the cyst, and a 15 x 10 mm AXIOS stent was placed with the flanges in close approximation to the walls of the cyst and the stomach through the cystogastrostomy. The stent was successfully placed. Pancreatic parenchymal abnormalities were noted in the entire pancreas. These consisted of hyperechoic strands, hypoechoic foci and lobularity.    Post-Operative Diagnosis:       - A cystic lesion was seen in the pancreatic head.       - Pancreatic parenchymal abnormalities consisting  of hyperechoic strands, hypoechoic foci and lobularity were noted in the entire pancreas.       - Cystogastrostomy was performed.       - No specimens collected.    Estimated Blood Loss:       Estimated blood loss: none.    Recommendation:       - Patient has a contact number available for emergencies. The signs and symptoms of potential  delayed complications were discussed with the patient. Return to normal activities tomorrow. Written discharge instructions were provided to the patient.       - Resume previous diet.       - Observe patient's clinical course.       - Continue present medications.  Alvan Dame, MD  [facility] Iona Hansen, MD  01/04/2022 3:08:17 PM  This report has been signed electronically.Alvan Dame , MD  Number of Addenda: 0  Note Initiated On: 01/04/2022 2:11 PM  ProVation Images are in [facility] under Media tab       [address] [place] [address] [place] [address]  Electronically signed by Golda Acre, MD at 01/04/2022  3:08 PM EST

## 2022-01-04 NOTE — Procedures (Signed)
Formatting of this note might be different from the original.  Images from the original note were not included.      01/04/2022, 4:27 PM     EUS completed and significant for:   35 mm anechoic mass at the level of the HOP   19g needle used to puncture the cyst with approximately 10 cc's of brown tinged fluid collected for amylase, CEA, gram stain / culture, cytology and glucose  A 15 mm x 10 mm electrocautery enhanced Axios was used to create a cystduodenostomy with fluid seen draining     Rec:  Follow up cyst fluid analysis   CT in 1 week  EGD with stent removal in 4   Alcohol and tobacco cessation  Diet as tolerated   Anti-emetics as needed     To view endoscopic images go to the Chart Review Activity > Media Tab > The document type your searching for is "endo picture" and should be filed on the day of the procedure.    Letitia Neri, MD [facility] FACP  Digestive and Liver Disease Specialist  Office [phone]  Pager [phone]  Weekends or after 5pm please page MD on call    Electronically signed by Golda Acre, MD at 01/04/2022  4:28 PM EST

## 2022-01-04 NOTE — Case Communication (Signed)
Formatting of this note is different from the original.     01/04/22 5462   Initial Assessment   Interview completed with Patient   Tell me more about why you came to the hospital. pain and vomitting   Where did the patient reside prior to admission? Home/Apartment   How many stairs into home? 3   Bedroom location? Downstairs   Bathroom location? Downstairs   Do you have someone who you would like involved in your care or would be available to assist you upon discharge? No   Patient Caregiver understands and accepts role? No   How hard is it for you to pay for the very basics like food, housing, medication, medical care, transportation to these appts, and heating? patient reported no problems paying for basics at time of assessment.   Functional Status prior to admission   Basic ADL's (Activities of Daily Living) - i.e. Personal Hygiene, Dressing, Eating, Continence, Transferring/Mobility Independent   Instrumental ADL's (IADL's) - i.e. Communication, Transportation, Meal Preparation, Shopping, Housework, Lexicographer ADL's (medical care) - i.e. Taking Medications, administering medical treatments Independent   Primary Care Physician (PCP) Verification Updated  (patient reported that she has pcp and does not recall the name of pcp)   Did the patient have [place] prior to admission? No   Assistive devices used at [facility] None   Other Equipment None   Additional Resources   What other community resources has the patient utilized in the past? None   Does patient have prescription drug coverage? Yes   When you think about your health, what is most important to you? returning back to baseline   What are you and your family/caregivers preferences for where you go for care after the hospital? home   Anticipated Transition Plan (Discharge Disposition) Home   Alternative Transition Plan Home with Outpatient Services   Assessment Completed Case Management will continue to follow.      ICM met with the patient at bedside to complete assessment.  ICM discussed ICM role.  ICM verified demographics on the patient's facesheet.  Patient reported that she lives in an 1 level apartment.  Patient indicated that she is independent with ADLs and IADLs.  Patient indicated that she currently works and plans on returning back to work once medically stable.    PPE was used including;  mask-surgical while interacting with patient.    Delsa Sale, MSW  [facility] Inpatient Case Manager Team Leader      Electronically signed by Delsa Sale, MSW at 01/04/2022  9:25 AM EST

## 2022-01-04 NOTE — Telephone Encounter (Signed)
Formatting of this note is different from the original.      [facility] Patient Referral Form  Fax Number: [phone]    Fax Sent Date:  01/04/2022    Provider Information:     Facility Name: [facility] Department: Health and Preventive Services   Zip Code: [phone]  Provider/Clinician:  N/A  Fax: [phone]  Phone: [phone]     Yes, we are a HIPAA-Covered Entity  Yes, I want faxed outcome reports       Patient Information:     Name:  Rhonda Hammond   DOB:  [birth year]  Emails:   No e-mail address on record    Home Phone [phone]   Work Phone Not on file.   Mobile [phone]     Language preference:  English    YES   I agree that [facility] may contact me at the phone number given.   YES   I give permission for QUIT NOW to send text messages to this number.     Patient Signature:   Patient gave verbal permission to provider  Date: 01/04/2022    Best contact times for patient are unknown.     Electronically signed by Renata Caprice at 01/04/2022  4:56 PM EST

## 2022-01-04 NOTE — Progress Notes (Signed)
Formatting of this note is different from the original.  Images from the original note were not included.  Date: 01/04/2022  Time: 7:44 AM    Scan Patient Arm Band: [id number]    Primary Dx: Pancreatitis [K85.90]  Patient LOS: 6  Expected Discharge Date: Jan 04, 2022      Plan of Care:    IV Drips:     All drips have been reviewed    CVL / PIV Lines:     Patient Lines/Drains/Airways Status       Active Peripheral Venous Line / Central Venous Line / Arterial Line / Left Atrial Line / Epidural Line / Airway / Subcutaneous Line / Drain / PIV Line / Intraosseous Line       Name Placement date Placement time Site Days Last dressing change    PIV: 12/29/21 1536 20 gauge Forearm Anterior;Left;Proximal 12/29/21  1536  Forearm  5     PIV: 12/31/21 1531 22 gauge Forearm Anterior;Proximal;Right 12/31/21  1531  Forearm  3                Dressing Dates , CHG Bath needs and patient presentation has been reviewed.    ACTIVE Foley Orders :     Foley 8-step care has been reviewed    Foley INDICATION has been reviewed     ACTIVE TELE order     Active Telemetry Orders (From admission, onward)      None         Tele Orders have been confirmed and reviewed.     ACTIVE Wounds     Patient Lines/Drains/Airways Status       Active Wound / Wound Vac Therapy / Rash / Burns       Name Placement date Placement time Site Days Additional Info Last dressing change    Incision: Abdomen 02/10/21 1051 02/10/21  1051  Abdomen  Trocar Sites X 4  327 Pre-existing: No                 Wound care Orders have been confirmed and reviewed.   LDA & Skin-man have been updated and reviewed for accuracy    Labs     Recent Labs     01/04/22  0109 01/03/22  0138 01/02/22  0319   NA 136 139 139   POTASSIUM 4.2 4.1 3.8   CREAT 0.5 0.4* 0.5   GLUCOSE 91 100* 95   CALCIUM 9.7 9.5 8.8   MAGNESIUM 1.8 1.9 1.8   PHOSPHORUS 4.5 4.9* 4.9*     H/H:   Lab Results   Component Value Date    HEMOGLOBIN 9.4 (L) 01/04/2022    HCT 27.7 (L) 01/04/2022     CPK: No results found  for: "CPK"  Labs and Electrolyte Replacement protocols have been confirmed and reviewed.     Restraints     Restraint Orders have been reviewed for accuracy.     Communication Whiteboard updated, Bedside shift Report Completed. The Above orders and Lab results have been reviewed with off going nurse by Sherlie Ban, RN          Electronically signed by Sherlie Ban, RN at 01/04/2022  7:45 AM EST

## 2022-01-04 NOTE — Progress Notes (Signed)
Formatting of this note might be different from the original.  Paged on call informed pain is 7/10 and pt states its improving and she's ready to eat.  On call stated they would put orders in for diet and if pain worsens again to page on call  Electronically signed by Joylene Igo, LPN at 22/29/7989  2:11 PM EST

## 2022-01-04 NOTE — Care Plan (Signed)
Formatting of this note might be different from the original.    Problem: Fall Prevention  Goal: Prevent/Manage Accidental Injury (Falls)  Description: Consider requesting a pharmacologic review as needed.  Consider asking the MD to consult PT / OT for an evaluation.  Outcome: Progressing    Electronically signed by Ranelle Oyster, RN at 01/04/2022 10:00 AM EST

## 2022-01-04 NOTE — Progress Notes (Signed)
Formatting of this note might be different from the original.  Patient A&Ox4, post procedure education given, patient verbalized understanding with no further questions.   Electronically signed by Ranelle Oyster, RN at 01/04/2022 10:01 AM EST

## 2022-01-05 NOTE — Progress Notes (Signed)
Formatting of this note might be different from the original.  I, Lilyan Gilford, MD  have interviewed and examined Rhonda Hammond and discussed his care with the resident team. I have reviewed the chart on Rhonda Hammond and I agree with the documentation as charted. Abd pain increased but able to tol salad last evening. Had nml BM this am.    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     WBC noted - likely reactive. If she stays would trend tomorrow but o/w could recheck outpt    [name], MD  [facility]  [phone] (o)  Electronically signed by Lilyan Gilford, MD at 01/05/2022  1:54 PM EST

## 2022-01-05 NOTE — Progress Notes (Signed)
Formatting of this note is different from the original.  Images from the original note were not included.  Date: 01/05/2022  Time: 7:37 AM    Scan Patient Arm Band: [id number]    Primary Dx: Pancreatitis [K85.90]  Patient LOS: 7  Expected Discharge Date: Jan 04, 2022      Plan of Care:    IV Drips:     All drips have been reviewed    CVL / PIV Lines:     Patient Lines/Drains/Airways Status       Active Peripheral Venous Line / Central Venous Line / Arterial Line / Left Atrial Line / Epidural Line / Airway / Subcutaneous Line / Drain / PIV Line / Intraosseous Line       Name Placement date Placement time Site Days Last dressing change    PIV: 12/29/21 1536 20 gauge Forearm Anterior;Left;Proximal 12/29/21  1536  Forearm  6                Dressing Dates , CHG Bath needs and patient presentation has been reviewed.    ACTIVE Foley Orders :     Foley 8-step care has been reviewed    Foley INDICATION has been reviewed     ACTIVE TELE order     Active Telemetry Orders (From admission, onward)      None         Tele Orders have been confirmed and reviewed.     ACTIVE Wounds     Patient Lines/Drains/Airways Status       Active Wound / Wound Vac Therapy / Rash / Burns       Name Placement date Placement time Site Days Additional Info Last dressing change    Incision: Abdomen 02/10/21 1051 02/10/21  1051  Abdomen  Trocar Sites X 4  328 Pre-existing: No                 Wound care Orders have been confirmed and reviewed.   LDA & Skin-man have been updated and reviewed for accuracy    Labs     Recent Labs     01/05/22  0255 01/04/22  0109 01/03/22  0138   NA 131* 136 139   POTASSIUM 4.2 4.2 4.1   CREAT 0.4* 0.5 0.4*   GLUCOSE 142* 91 100*   CALCIUM 10.2 9.7 9.5   MAGNESIUM 1.7 1.8 1.9   PHOSPHORUS 2.5 4.5 4.9*     H/H:   Lab Results   Component Value Date    HEMOGLOBIN 10.8 (L) 01/05/2022    HCT 31.1 (L) 01/05/2022     CPK: No results found for: "CPK"  Labs and Electrolyte Replacement protocols have been confirmed and  reviewed.     Restraints     Restraint Orders have been reviewed for accuracy.     Communication Whiteboard updated, Bedside shift Report Completed. The Above orders and Lab results have been reviewed with off going nurse by Basilio Cairo, RN          Electronically signed by Basilio Cairo, RN at 01/05/2022  7:40 AM EST

## 2022-01-05 NOTE — Progress Notes (Signed)
Formatting of this note is different from the original.  Date: 01/05/2022  Time: 7:10 PM    Scan Patient Arm Band: [id number]    Primary Dx: Pancreatitis [K85.90]  Patient LOS: 7  Expected Discharge Date: Jan 04, 2022      Plan of Care:    IV Drips:     All drips have been reviewed    CVL / PIV Lines:     Patient Lines/Drains/Airways Status       Active Peripheral Venous Line / Central Venous Line / Arterial Line / Left Atrial Line / Epidural Line / Airway / Subcutaneous Line / Drain / PIV Line / Intraosseous Line       Name Placement date Placement time Site Days Last dressing change    PIV: 12/29/21 1536 20 gauge Forearm Anterior;Left;Proximal 12/29/21  1536  Forearm  7                Dressing Dates , CHG Bath needs and patient presentation has been reviewed.    ACTIVE Foley Orders :     Foley 8-step care has been reviewed    Foley INDICATION has been reviewed     ACTIVE TELE order     Active Telemetry Orders (From admission, onward)      None         Tele Orders have been confirmed and reviewed.     ACTIVE Wounds     Patient Lines/Drains/Airways Status       Active Wound / Wound Vac Therapy / Rash / Burns       Name Placement date Placement time Site Days Additional Info Last dressing change    Incision: Abdomen 02/10/21 1051 02/10/21  1051  Abdomen (Comment) Trocar Sites X 4  329 Pre-existing: No                 Wound care Orders have been confirmed and reviewed.   LDA & Skin-man have been updated and reviewed for accuracy    Labs     Recent Labs     01/05/22  1016 01/05/22  0255 01/04/22  0109 01/03/22  0138   NA 133 131* 136 139   POTASSIUM 5.2 4.2 4.2 4.1   CREAT 0.5 0.4* 0.5 0.4*   GLUCOSE 131* 142* 91 100*   CALCIUM 10.3 10.2 9.7 9.5   MAGNESIUM  --  1.7 1.8 1.9   PHOSPHORUS  --  2.5 4.5 4.9*     H/H:   Lab Results   Component Value Date    HEMOGLOBIN 10.8 (L) 01/05/2022    HCT 31.6 (L) 01/05/2022     CPK: No results found for: "CPK"  Labs and Electrolyte Replacement protocols have been confirmed and  reviewed.     Restraints     Restraint Orders have been reviewed for accuracy.     Communication Whiteboard updated, Bedside shift Report Completed. The Above orders and Lab results have been reviewed with off going nurse by Earley Brooke, RN          Electronically signed by Earley Brooke, RN at 01/05/2022  7:11 PM EST

## 2022-01-05 NOTE — Progress Notes (Signed)
Formatting of this note is different from the original.  Images from the original note were not included.      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 is a 31 y.o. female with a past medical history of chronic alcoholic pancreatitis who was admitted for acute on chronic alcoholic pancreatitis with an increasing area of walled-off necrosis s/p EUS with cystogastrostomy 12/20.    Plan for today:  - PO Percocet for pain control    Acute on chronic alcoholic pancreatitis c/b walled-off necrosis s/p EUS with cystogastrostomy 12/20, improving  - CT abd/pelvis showing an interval increase in size of homogenous hypodense pancreatic head cystic lesion now 4.8cm representing walled-off necrosis  - afebrile without leukocytosis - likely not infected and no need for antibiotics at this time  []  IV PPI BID  []  PO Percocet 1-2 panel q4h PRN for pain control  []  IV Zofran for nausea  []  Regular diet  []  GI following, appreciate recommendations  []  Repeat CT abd/pelvis in one week  []  EGD with stent removal in 4 weeks    Leukocytosis, stable  - likely reactive after procedure  []  Daily CBC    Iron deficiency anemia, stable  - Ganzoni iron deficit 2.2g s/p Venofer  []  Iron tabs every other day    Heavy alcohol use  - no hx of withdrawals  []  Discontinued CIWA - scoring 0, outside of withdrawal window  []  MV, thiamine, folate    Barriers to discharge: pain control    Global Issues  DVT Prophylaxis: SQH  Diet: Regular  Code Status: Full  Physical Therapy: -  Occupational Therapy: -  PCP: None No PCP PCP, MD    Further  management for [name] will be discussed on rounds with my attending.    Subjective:     Events of the last 24 hours:  No acute events overnight. Abdominal pain is rated as 6/10. Patient is willing to try PO pain meds later today. Eating well and denies nausea/vomiting.    Review of Systems:      Positives are bolded.    Const: no fevers/chills  CV: no chest pain  Pulm: no Shortness of Breath  GI: no n/v, (+) abdominal pain  Neuro: no focal weakness/numbness/tingling    Current Inpatient Meds:     acetaminophen, 650 mg, Q6H  ferrous sulfate, 325 mg, Every Other Day  folic acid, 1 mg, Daily  [Held by provider] heparin, 5,000 Units, Q8H  pantoprazole (Protonix) 40 mg in sodium chloride 0.9 % 10 mL syringe, 40 mg, BID  therapeutic multivitamin-minerals, 1 Tab, Daily  thiamine, 100 mg, Daily      LR, Last Rate: 30 mL/hr (01/04/22 1006)    Objective:      Temp (24hrs), Avg:98 F (36.7 C), Min:97.4 F (36.3 C), Max:98.7 F (37.1 C)    BP 108/51   Pulse 66   Temp 98.3 F (36.8 C)   Resp 18  Ht 5\' 5"  (1.651 m)   Wt 95.8 kg (211 lb 3.2 oz)   SpO2 94%   BMI 35.15 kg/m     Patient Vitals for the past 24 hrs:   Temp Heart Rate Pulse Resp BP BP Mean SpO2 Weight Flow (L/min) (Oxygen Therapy)   01/05/22 0405 98.3 F (36.8 C) -- 66 18 108/51 74 MM HG 94 % 95.8 kg (211 lb 3.2 oz) --   01/04/22 1938 98.1 F (36.7 C) -- 62 18 131/67 88 MM HG 99 % -- --   01/04/22 1545 97.9 F (36.6 C) 105 88 18 128/87 97 MM HG 95 % -- --   01/04/22 1535 -- 107 87 17 135/87 100 MM HG 96 % -- --   01/04/22 1531 -- 81 89 11 128/80 94 MM HG 99 % -- --   01/04/22 1530 -- 91 96 21 128/80 94 MM HG 99 % -- --   01/04/22 1525 -- 90 90 10 132/81 94 MM HG 100 % -- --   01/04/22 1523 -- 83 92 10 132/81 98 MM HG 99 % -- --   01/04/22 1520 -- 112 111 15 137/89 (!) 104 MM HG 95 % -- --   01/04/22 1515 97.5 F (36.4 C) 101 102 13 134/82 95 MM HG 98 % -- --   01/04/22 1015 -- 58 -- 13 -- -- 96 % -- --   01/04/22 1010 -- 54 -- 12 -- -- 93  % -- --   01/04/22 1005 -- 54 -- 14 118/62 81 MM HG 93 % -- --   01/04/22 1000 -- 62 -- 11 111/56 74 MM HG 96 % -- --   01/04/22 0949 97.4 F (36.3 C) -- -- -- -- -- -- 95.3 kg (210 lb) --   01/04/22 0932 -- -- -- -- -- -- -- -- 0   01/04/22 0752 98.7 F (37.1 C) -- 73 17 111/59 81 MM HG 93 % -- --     Intake/Output Summary (Last 24 hours) at 01/05/2022 0653  Last data filed at 01/05/2022 0426  Gross per 24 hour   Intake 1120 ml   Output 0 ml   Net 1120 ml     Positives are bolded.    Gen: Alert. NAD.  CV: RRR. Normal S1/S2. No m/r/g.  Pulm: CTAB. No crackles/wheezes/rhonchi. No accessory muscle use.  Abdomen: Soft, ND. Normal BS. Epigastric tenderness to palpation.  Ext: No c/c/e.  Neuro: A/A/O x4.  Skin: No petechiae. No rash. Warm/dry/intact.  Psych: Appropriate mood and affect.    Labwork and Ancillary Studies:     Labs/Studies reviewed:    Most Recent CBC:  Lab Results   Component Value Date/Time    WBC 9.2 01/05/2022 02:55 AM    RBC 4.42 01/05/2022 02:55 AM    HEMOGLOBIN 10.8 (L) 01/05/2022 02:55 AM    HCT 31.1 (L) 01/05/2022 02:55 AM    MCV 70 (L) 01/05/2022 02:55 AM    MCH 24 (L) 01/05/2022 02:55 AM    MCHC 35 01/05/2022 02:55 AM    RDW 19.2 (H) 01/05/2022 02:55 AM    PLATELET 263 01/05/2022 02:55 AM    MPV 10.4 01/05/2022 02:55 AM     Most Recent BMP:  Lab Results   Component Value Date    NA 131 (L) 01/05/2022    POTASSIUM 4.2 01/05/2022    CHLORIDE 100 01/05/2022    CO2 23 01/05/2022    BUN 9 01/05/2022  CREAT 0.4 (L) 01/05/2022    GLUCOSE 142 (H) 01/05/2022    CALCIUM 10.2 01/05/2022    MAGNESIUM 1.7 01/05/2022    PHOSPHORUS 2.5 01/05/2022     Most Recent Liver Function:  Lab Results   Component Value Date/Time    SGOTAST 20 12/29/2021 03:22 PM    SGPTALT 6 12/29/2021 03:22 PM    ALKPHOS 62 12/29/2021 03:22 PM    BILIT 0.3 12/29/2021 03:22 PM    BILID <0.2 12/29/2021 03:22 PM    TOTPR 8.4 (H) 12/29/2021 03:22 PM    ALBUMIN 4.4 12/29/2021 03:22 PM     Most Recent Coagulation:  No results found for:  "APTT", "PT", "INR"   Most Recent Lipid Panel:  Lab Results   Component Value Date/Time    TRIGLYCERIDE 48 12/30/2021 07:57 AM     Most Recent HbA1C/Microalbuminuria:  Lab Results   Component Value Date/Time    CREATUMGDL 63 05/26/2021 02:35 PM     Most Recent HIV/Hep C:  No results found for: "HIVABAG", "HIVINTERP", "HEPCAB"    Imaging/Studies    CT ABD/PELVIS-IV ONLY January 13, 2023  Impression    Addendum:    The pancreatic head collection appears to be surrounded with pancreatic tissue and therefore representing walled-off necrosis.    Interval increase in size of homogeneous hypodense pancreatic head cystic lesion 4.8 cm (previously 2.7 cm on 11/25/2021), the lesion has been present since 05/29/2020, presumably pseudocyst.  -Perhaps minimal increase peripancreatic stranding suggesting early recurrent pancreatitis.    [place] J Tyrrell, [facility] PGY-1  Available Via Epic Chat  01/05/2022, 6:53 AM    Electronically signed by Lilyan Gilford, MD at 01/06/2022  1:08 PM EST

## 2022-01-05 NOTE — Case Communication (Signed)
Formatting of this note might be different from the original.  Pt discussed in MDR. Pt potentially discharge this evening. Pt tolerating meals Pt has no CM needs.     Required PPE worn at all times while in the patient room: facial mask and protective eye wear.     Yolanda Bonine, MSW  Supervisee in Social Work   Inpatient Case Buyer, retail signed by Lanier Prude, MSW at 01/05/2022  2:13 PM EST

## 2022-01-05 NOTE — Progress Notes (Signed)
Formatting of this note might be different from the original.  Images from the original note were not included.      Ms. Jessee has no complaints regarding her anesthetic care.  She appears awake and alert.  Ms. Borromeo states she had no awareness during her anesthesia.   Follow-up is not necessary as she has no complaints about  her anesthetic care, had no intraoperative awareness, and there are no documented anesthetic complications.    [id number]  Electronically signed by Sherron Ales, MD at 01/05/2022  1:18 PM EST

## 2022-01-05 NOTE — Consults (Signed)
Formatting of this note is different from the original.  Images from the original note were not included.    GI PROGRESS NOTE    Admit Date: 12/29/2021  Reason for initial consult pancreatitis     Assessment:   Acute on chronic pancreatitis now with imaging showing pseudocyst with necrosis. Alcohol history.         EUS completed and significant for:   35 mm anechoic mass at the level of the HOP   19g needle used to puncture the cyst with approximately 10 cc's of brown tinged fluid collected for amylase, CEA, gram stain / culture, cytology and glucose  A 15 mm x 10 mm electrocautery enhanced Axios was used to create a cystduodenostomy with fluid seen draining   CT 12/28/2021 4.8 cm lesion pancreatic head with walled off necrosis. Increased in size compared to prior. S/p cholecystectomy. Splenic collection 4.8x4.1 cm.   2. Acute on chronic anemia 7.9 with microcytic indices. To receive iv iron. (Baseline around 9). Iron saturation 31% and ferritin 16.  3. HCG negative.   4. RAD    Cholecystectomy 02/10/2021 mild chronic cholecystitis and cholelithiasis    Plan:   CT in 1 week  EGD with stent removal in 4 , office to arrange  Alcohol and tobacco cessation  Diet as tolerated   Anti-emetics as needed   Ok for [employer]  Available as needed    Vida Rigger, FNP-C  Weekdays M-F 9-5 please page [phone]. Weekends or after 5pm please page MD on call or call the office at [phone] for assistance.     01/05/2022 3:25 PM     Addendum:  I have reviewed the nurse practitioner's note as well as performed an independent interview and exam, I have reviewed the pertinent labs and X-rays and agree with the assessment and plan as outlined above.     No abdominal pain     BP 136/68   Pulse 61   Temp 97.6 F (36.4 C)   Resp 18   Ht 5\' 5"  (1.651 m)   Wt 95.8 kg (211 lb 3.2 oz)   SpO2 99%   BMI 35.15 kg/m   AAO x 3   RRR  CTAB  Abdomen soft     Labs and imaging reviewed     CBC w/Diff  Lab Results   Component Value Date/Time    WBC 11.2  (H) 01/05/2022 08:25 AM    RBC 4.44 01/05/2022 08:25 AM    HEMOGLOBIN 10.8 (L) 01/05/2022 08:25 AM    HCT 31.6 (L) 01/05/2022 08:25 AM    MCV 71 (L) 01/05/2022 08:25 AM    MCH 24 (L) 01/05/2022 08:25 AM    MCHC 34 01/05/2022 08:25 AM    RDW 19.5 (H) 01/05/2022 08:25 AM    PLATELET 262 01/05/2022 08:25 AM    MPV 9.9 01/05/2022 08:25 AM    BANDS 1 07/14/2021 12:35 PM    SEGS 83 (H) 01/05/2022 08:25 AM    SEGS 60 07/14/2021 12:35 PM    LYMPHOCYTES 11 (L) 01/05/2022 08:25 AM    LYMPHOCYTES 30 07/14/2021 12:35 PM    MONOS 5 01/05/2022 08:25 AM    MONOS 8 07/14/2021 12:35 PM    EOS 0 01/05/2022 08:25 AM    EOS 1 07/14/2021 12:35 PM    BASOS 0 01/05/2022 08:25 AM     Recommendations:   CT in 1 week  EGD in 4 weeks   Alcohol and tobacco cessation  Anti-emetics as needed  Judicious use of narcotics    We will sign off   Please don't hesitate to call with any questions or concerns      Letitia Neri, MD [facility] FACP  [facility]  Office [phone]  Pager [phone]  Weekends or after 5pm please page MD on call    (additional independent time spent 15 minutes)     Subjective:     + abdominal pain    Objective:     BP 133/62   Pulse 69   Temp 97.7 F (36.5 C)   Resp 17   Ht 5\' 5"  (1.651 m)   Wt 95.8 kg (211 lb 3.2 oz)   SpO2 95%   BMI 35.15 kg/m   Temp (24hrs), Avg:97.9 F (36.6 C), Min:97.5 F (36.4 C), Max:98.3 F (36.8 C)    STOOL:    Physical Exam:  General Appearance: Appears in no acute distress.,  Skin: Skin warm & dry, No rash, jaundice, pallor,   HEENT: Moist oral mucous membranes,   Lungs: Clear, No wheezes., No rhonchi, Normal respiratory effort,   Heart: Regular rate and rhythm,   Abdomen: Soft , Non-distended, Normal bowel sounds and Non-tender,   Neuro: alert, oriented, affect appropriate and speech fluent    Intake/Output Summary (Last 24 hours) at 01/05/2022 1130  Last data filed at 01/05/2022 3244  Gross per 24 hour   Intake 1919 ml   Output 0 ml   Net 1919 ml     Recent Labs      01/05/22  0825 01/05/22  0255   WBC 11.2* 9.2   RBC 4.44 4.42   HEMOGLOBIN 10.8* 10.8*   HCT 31.6* 31.1*   MCV 71* 70*   MCHC 34 35   RDW 19.5* 19.2*   PLATELET 262 263   MPV 9.9 10.4     Recent Labs     01/05/22  0255 01/04/22  0109   NA 131* 136   POTASSIUM 4.2 4.2   CHLORIDE 100 103   CO2 23 22   ANIONGAP 8.0 11.0   CALCIUM 10.2 9.7   BUN 9 8   CREAT 0.4* 0.5   GLUCOSE 142* 91     No results for input(s): "PT", "INR" in the last 72 hours.    No results for input(s): "ALBUMIN", "TOTPR", "ALKPHOS", "SGOTAST", "SGPTALT", "BILIT", "BILID" in the last 72 hours.    Invalid input(s): "CONJUGATEDF"    No results for input(s): "AMYLASE", "LIPASE" in the last 72 hours.    Current Facility-Administered Medications   Medication Dose Route Frequency Provider Last Rate Last Admin    albuterol (ProventiL, Ventolin) 2.5 mg /3 mL (0.083 %) nebulizer solution 2.5 mg  2.5 mg Inhalation Q2H PRN Resp Pokhriyal, Megha, DO,RES        calcium gluconate 2 gram/100 mL in 141ml NS IVPB 2 g  2 g Intravenous PRN Pokhriyal, Megha, DO,RES        calcium gluconate in 37ml NS IVPB 1 g  1 g Intravenous PRN Pokhriyal, Megha, DO,RES        diphenhydrAMINE (BenadryL) capsule 25 mg  25 mg Oral Q6H PRN Coltrain, Lauren E, MD,RES   25 mg at 12/30/21 1245    ferrous sulfate (Feosol) tablet 325 mg  325 mg Oral Every Other Day Tyrrell, [place] J, MD,RES   325 mg at 01/17/70 5366    folic acid (Folvite) tablet 1 mg  1 mg Oral Daily Camelia Phenes, MD,RES   1  mg at 01/05/22 0421    heparin injection 5,000 Units  5,000 Units Subcutaneous Q8H Pokhriyal, Megha, DO,RES   5,000 Units at 12/30/21 [id number]    magnesium oxide (Mag-Ox) tablet 400-800 mg  400-800 mg Oral PRN Pokhriyal, Megha, DO,RES   400 mg at 01/05/22 0421    magnesium sulfate 1g/D5W 100ml ivpb 1 g  1 g IVPB PRN Pokhriyal, Megha, DO,RES 200 mL/hr at 01/05/22 [id number] Infusion Data Validate at 01/05/22 [id number]    magnesium sulfate 2g/3850ml (4%) in water IVPB 2 g  2 g IVPB PRN Pokhriyal, Megha, DO,RES         NS Flush injection 10 mL  10 mL Intravenous PRN Pokhriyal, Megha, DO,RES        ondansetron (PF) (Zofran) injection 4 mg  4 mg IV Push Q8H PRN Pokhriyal, Megha, DO,RES   4 mg at 01/05/22 [id number]    oxyCODONE-acetaminophen (Percocet) 5-325 mg tablet 1 Tab  1 Tab Oral Q4H PRN Tyrrell, [place] J, MD,RES        Or    oxyCODONE-acetaminophen (Percocet) 5-325 mg tablet 2 Tab  2 Tab Oral Q4H PRN Tyrrell, [place] J, MD,RES        pantoprazole (Protonix) 40 mg in sodium chloride 0.9 % 10 mL syringe  40 mg IV Push BID Retta MacAttar, Taylor A, MD   40 mg at 01/05/22 0421    PHOS-NAK 280-160-250 mg powder 2 Packet  2 Packet Oral PRN Pokhriyal, Megha, DO,RES        potassium chloride (Klor-Con) packet 20-60 mEq  20-60 mEq Oral PRN Pokhriyal, Megha, DO,RES   20 mEq at 01/02/22 0601    potassium chloride ER (K-Dur;Klor-Con) tablet 20-60 mEq  20-60 mEq Oral PRN Pokhriyal, Megha, DO,RES        potassium chloride ER (K-Dur;Klor-Con) tablet 20-60 mEq  20-60 mEq Oral PRN Pokhriyal, Megha, DO,RES        potassium chloride in water (KCL) 10 mEq/100 mL IVPB 10 mEq  10 mEq Intravenous PRN Pokhriyal, Megha, DO,RES        potassium chloride in water 20 mEq/100 mL infusion 20 mEq  20 mEq Intravenous via Central Line PRN Pokhriyal, Megha, DO,RES        potassium chloride in water 20 mEq/50 mL infusion 20 mEq  20 mEq Intravenous via Central Line PRN Pokhriyal, Megha, DO,RES        potassium phosphate (K Phos Neutral/Phospha Neutral) 250 mg tablet 2 Tab  2 Tab Oral PRN Pokhriyal, Megha, DO,RES        sodium phosphate 12 mmol in D5W 100 mL IVPB  12 mmol Intravenous PRN Pokhriyal, Megha, DO,RES        sodium phosphate 18 mmol in D5W 100 mL IVPB  18 mmol Intravenous PRN Pokhriyal, Megha, DO,RES        sodium phosphate 21 mmol in D5W 100 mL IVPB  21 mmol Intravenous PRN Pokhriyal, Megha, DO,RES        sodium phosphate 6 mmol in D5W 50 mL IVPB  6 mmol Intravenous PRN Pokhriyal, Megha, DO,RES        sodium phosphate 9 mmol in D5W 100 mL IVPB  9 mmol Intravenous  PRN Pokhriyal, Megha, DO,RES        therapeutic multivitamin-minerals (Theragran-M) 1 Tab  1 Tab Oral Daily Pokhriyal, Megha, DO,RES   1 Tab at 01/05/22 0421    thiamine tablet 100 mg  100 mg Oral Daily Pokhriyal, Megha, DO,RES   100 mg at 01/05/22 0421  Electronically signed by Henrietta Dine, MD at 01/05/2022  3:26 PM EST

## 2022-01-05 NOTE — Progress Notes (Signed)
Formatting of this note might be different from the original.  [facility]  Post - Rounding Note     Patient seen with team with following updates:    Came to patient's bedside to discuss disposition plan  Patient was tearful and pacing her room while holding her stomach  She reported pain in her abdomen that "all over"  Patient's abdomen was diffusely tender to palpation with guarding  Will likely keep patient in the hospital overnight to address severe pain  Dilaudid 0.5-1 panel added and held percocet; ok for additional 1 mg Dilaudid if pain not sufficiently managed  Patient noted to have WBCs to 11.2 (she appears to normally sit closer to 5-6; likely reactive to surgery) and acidosis with bicarb of 19  Will consider roxicodone instead of Percocet in order to not mask any potential infectious process     Randa Spike, MD  [facility], PGY-2  Pager: [phone]     Electronically signed by Camelia Phenes, MD,RES at 01/05/2022  3:05 PM EST

## 2022-01-06 NOTE — Discharge Summary (Signed)
Formatting of this note is different from the original.  Images from the original note were not included.      [facility]   Discharge Summary    Admit Date: 12/29/2021  Discharge Date: 01/06/2022  Attending:  Archie Balboa, MD  Primary Care Provider:  None No PCP PCP, MD    Patient ID:   Rhonda Hammond  31 y.o. female  [birth year]    Reason For Admission:   Pancreatitis [K85.90]    Hospital Course:   Summary:  Rhonda Hammond is a 31 y.o. female with a past medical history of chronic alcoholic pancreatitis who was admitted for acute on chronic alcoholic pancreatitis with an increasing area of walled-off necrosis. Patient was given IV fluids, started on bowel rest, and IV Dilaudid for pain with significant improvement. GI was consulted for walled-off necrosis and they performed EUS with Axios stent placement and cystogastrostomy on 12/20. After 48 hours from the procedure, patient's pain significantly improved on PO pain medication and was tolerating a regular diet.    Problem Based Assessment and Discharge Plan:    Acute on chronic alcoholic pancreatitis c/b walled-off necrosis s/p EUS with cystogastrostomy 12/20  - CT abd/pelvis showing an interval increase in size of homogenous hypodense pancreatic head cystic lesion now 4.8cm representing walled-off necrosis  []  Omeprazole 40mg  QD  []  Given 12 tabs of PO Percocet on discharge for pain control  []  PO Zofran for nausea  []  Repeat CT abd/pelvis in one week per GI  []  EGD with stent removal in 4 weeks per GI  []  Needs to call to schedule follow up appointment with [facility]    Iron deficiency anemia  - [place] iron deficit 2.2g s/p Venofer  []  Iron tabs every other day    Heavy alcohol use  - no hx of withdrawals  []  MV, thiamine, folate    Items/Labs Pending on Discharge:   1) Follow up with [facility]  2) Repeat CT abd/pelvis in one week  3) EGD with stent removal in 4 weeks with GI    Discharge Medications         Discharge Medication List       START taking these medications      ferrous sulfate 325 mg (65 mg Iron) Tabs  Take 1 Tab by Mouth Every Other Day.  Dispense: 30 Tab  Refills: 0  Commonly known as: Feosol    folic acid 1 mg Tabs  Take 1 Tab by Mouth Once a Day.  Dispense: 30 Tab  Refills: 0  Commonly known as: Folvite    oxyCODONE-acetaminophen 5-325 mg Tabs  Take 1 Tab by Mouth Every 4 Hours As Needed.  Dispense: 12 Tab  Refills: 0  Commonly known as: Percocet    therapeutic multivitamin-minerals Tabs  Take 1 Tab by Mouth Once a Day.  Dispense: 30 Tab  Refills: 0  Commonly known as: Theragran-M    thiamine 100 mg Tabs  Take 1 Tab by Mouth Once a Day.  Dispense: 30 Tab  Refills: 0          CHANGE how you take these medications      albuterol 90 mcg/actuation Hfaa inhaler  Take 2 Puffs inhaled by mouth Every 6 Hours As Needed.  Dispense: 16 g  Refills: 0  Commonly known as: ProventiL, Ventolin, ProAir  What changed: Another medication with the same name was removed. Continue taking this medication, and follow the directions you see here.  ondansetron 4 mg Odt.  Take 2 Tabs by Mouth Every 8 Hours.  Dispense: 30 Tab  Refills: 0  Commonly known as: Zofran  What changed: how much to take          CONTINUE taking these medications      Alum-Mag Hydroxide-Simeth DS 400-400-40 mg/5 mL Suspension  Take 15 mL by Mouth Every 6 Hours As Needed for Mild Pain (Pain Score 1-3). gastritis  Dispense: 150 mL  Refills: 0  Commonly known as: Mylanta DS    fluticasone propionate 50 mcg/actuation Spsn  1 Spray by each nostril route Once a Day.  Dispense: 6 mL  Refills: 0  Commonly known as: Flonase    Omeprazole 40 mg Cpdr  Take 1 Cap by Mouth Once a Day.  Dispense: 30 Cap  Refills: 2          STOP taking these medications        Reason for Stopping   azithromycin 250 mg Tabs  Commonly known as: Zithromax Z-Pak    benzonatate 100 mg Caps  Commonly known as: Tessalon    naproxen 500 mg Tabs  Commonly known as:  Naprosyn    prochlorperazine 25 mg Supp  Commonly known as: Compazine    sucralfate 100 mg/mL Suspension  Commonly known as: Carafate          Post-Discharge Follow up Plan and Appointment      Discharge Procedure Orders   CT ABDOMEN-IV ONLY    Follow up acute on chronic pancreatitis with WON s/p cystogastrostomy on 12/20.    DO NOT ADD COMMENTS TO RADIOLOGY HERE. USE COMMENTS BOX ABOVE.    Preferred location(s):        Regular Diet     No Activity Restrictions     Additional Instructions as Follows:    You were admitted for pancreatitis. When doing a CT of your abdomen, we noticed that the cyst contained dead (necrotic) tissue, which required you to be seen by our GI specialists. They performed a procedure called an endoscopic ultrasound and placed a stent, which allows the area of dead tissue to drain and heal.    It is very important that you follow up with [facility]. Their office should call you to schedule an appointment, but if you do not hear from them by next Friday 12/29, then please call their office at [phone] to schedule an appointment. They are located at [address], [place], [place] [address]. You will need to get a CT scan of your abdomen in about one week from today. They will then remove the stent they placed by endoscopy in about one month of now.    Please also call the back of your insurance card to find a PCP and follow up with them in 1-2 weeks for a hospital discharge appointment. Please take your medications regularly as prescribed below and bring them with you to every doctor's appointment. If you experience uncontrollable nausea or vomiting, worsening abdominal pain, fevers or chills, yellowing of your eyes or skin, chest pain, or shortness of breath, then please visit your nearest emergency room.     Follow Up with PCP within 7 days    Please call the back of your insurance card to find a PCP and follow up with them in 1-2 weeks.     Follow Up with  Specialist    Follow up with [facility] in 4 weeks. Their office should  call you to schedule an appointment, but if you do not hear from them by next Friday 12/29, then please call their office at [phone] to schedule an appointment. They are located at [address], [place], [place] [address].     Physical Exam on Discharge:     BP 116/60   Pulse 70   Temp 97.5 F (36.4 C)   Resp 18   Ht 5\' 5"  (1.651 m)   Wt 96.3 kg (212 lb 4.9 oz)   SpO2 99%   BMI 35.33 kg/m     Positives are bolded.    Gen: Alert. NAD.  CV: RRR. Normal S1/S2. No m/r/g.  Pulm: CTAB. No crackles/wheezes/rhonchi. No accessory muscle use.  Abdomen: Soft, ND. Normal BS. Epigastric tenderness to palpation, significantly improved.  Ext: No c/c/e.  Neuro: A/A/O x4.  Skin: No petechiae. No rash. Warm/dry/intact.  Psych: Appropriate mood and affect.    Discharge Diagnosis:   Acute on chronic alcoholic pancreatitis  Walled-off necrosis  Leukocytosis  IDA  Heavy alcohol use    Significant Hospital Procedures:   EUS with Axios stent placement & cystogastrostomy - 12/20    Imaging:     Recent Results (from the past 336 hour(s))   1. CT ABD/PELVIS-IV ONLY    Addendum: 12/28/2021    Addendum:    The pancreatic head collection appears to be surrounded with pancreatic tissue and therefore representing walled-off necrosis.    Signed By: 12/30/2021, MD on 12/28/2021 4:47 PM       Narrative    EXAM: CT ABD/PELVIS-IV ONLY    CLINICAL INDICATION/HISTORY: h/o pancreatic pseudocyst, worsening epigastric pain    COMPARISON: CT 11/25/2021 and several priors    TECHNIQUE:  CT abdomen and pelvis with IV contrast.  All CT scans at this facility are performed using dose optimization technique as appropriate to the performed examination, to include automated exposure control, adjustment of the mA and/or kV according to patient's size (including appropriate matching for site-specific examinations), or use of an iterative  reconstruction technique.    FINDINGS:   Lower chest: Mild dependent atelectasis.    Liver: Negative.     Biliary: Cholecystectomy.    Pancreas: Negative.    Spleen: Redemonstration of pancreatic hypodense collection increasing in size measuring 4.8 x 4.1 cm (2.7 x 2.6 cm). This has been present since 05/29/2020 after an episode of acute pancreatitis on CT 01/28/2020.03/27/2020 Remainder of the pancreas is unremarkable. No pancreatic ductal dilatation. Perhaps minimal increased peripancreatic stranding. No peripancreatic free fluid or new collection    Adrenal glands: Negative.    Kidneys: Negative.    Stomach, Small Bowel and Colon: Negative.    Pelvic Organs: Negative    Bladder: Negative.    Lymph nodes: No lymphadenopathy.     Vessels: Unremarkable for age.     Peritoneal Spaces: No free fluid or free air.    Body wall: Negative.    Bones: Unremarkable for age.     Impression       Interval increase in size of homogeneous hypodense pancreatic head cystic lesion 4.8 cm (previously 2.7 cm on 11/25/2021), the lesion has been present since 05/29/2020, presumably pseudocyst.  -Perhaps minimal increase peripancreatic stranding suggesting early recurrent pancreatitis.    Signed By: 05/31/2020, MD on 12/28/2021 11:44 AM        Consults:   Gastroenterology    Condition at Discharge    Afebrile, Ambulating, Eating, Drinking, Voiding, Improved, Moving bowels, Pain control  acceptable, Stable, and Wound clean, healing well    Disposition:    Home    [place] J Tyrrell, MD,RES  [facility]  Available Via Epic Chat  01/08/2022, 11:27 AM    Electronically signed by Terrall LaityGoodman, Rhonda M, MD at 01/17/2022  2:03 PM EST

## 2022-01-06 NOTE — Progress Notes (Signed)
Formatting of this note is different from the original.  Images from the original note were not included.      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 is a 31 y.o. female with a past medical history of chronic alcoholic pancreatitis who was admitted for acute on chronic alcoholic pancreatitis with an increasing area of walled-off necrosis s/p EUS with cystogastrostomy 12/20.    Plan for today:  - PO Percocet for pain control    Acute on chronic alcoholic pancreatitis c/b walled-off necrosis s/p EUS with cystogastrostomy 12/20, improving  - CT abd/pelvis showing an interval increase in size of homogenous hypodense pancreatic head cystic lesion now 4.8cm representing walled-off necrosis  - afebrile without leukocytosis - likely not infected and no need for antibiotics at this time  []  IV PPI BID  []  PO Percocet 1-2 panel q4h PRN for pain control  []  IV Zofran for nausea  []  Regular diet  []  GI following, appreciate recommendations  []  Repeat CT abd/pelvis in one week  []  EGD with stent removal in 4 weeks    Leukocytosis, stable  - likely reactive after procedure  []  Daily CBC    Iron deficiency anemia, stable  - [place] iron deficit 2.2g s/p Venofer  []  Iron tabs every other day    Heavy alcohol use  - no hx of withdrawals  []  Discontinued CIWA - scoring 0, outside of withdrawal window  []  MV, thiamine, folate    Barriers to discharge: pain control    Global Issues  DVT Prophylaxis: SQH  Diet: Regular  Code Status: Full  Physical Therapy: -  Occupational Therapy: -  PCP: None No PCP PCP, MD    Further  management for [name] will be discussed on rounds with my attending.    Subjective:     Events of the last 24 hours:  No acute events overnight. Abdominal pain is improving again. Patient is willing to try PO pain meds today. Eating well and denies nausea/vomiting.    Review of Systems:      Positives are bolded.    Const: no fevers/chills  CV: no chest pain  Pulm: no Shortness of Breath  GI: no n/v, (+) abdominal pain  Neuro: no focal weakness/numbness/tingling    Current Inpatient Meds:     ferrous sulfate, 325 mg, Every Other Day  folic acid, 1 mg, Daily  heparin, 5,000 Units, Q8H  pantoprazole (Protonix) 40 mg in sodium chloride 0.9 % 10 mL syringe, 40 mg, BID  therapeutic multivitamin-minerals, 1 Tab, Daily  thiamine, 100 mg, Daily          Objective:      Temp (24hrs), Avg:98.3 F (36.8 C), Min:97.6 F (36.4 C), Max:98.8 F (37.1 C)    BP 125/65   Pulse 52   Temp 98.8 F (37.1 C)   Resp 18   Ht 5\' 5"  (1.651 m)   Wt 96.3 kg (212 lb 4.9 oz)  SpO2 99%   BMI 35.33 kg/m     Patient Vitals for the past 24 hrs:   Temp Pulse Resp BP BP Mean SpO2 Weight   01/06/22 0301 98.8 F (37.1 C) 52 18 125/65 83 MM HG 99 % 96.3 kg (212 lb 4.9 oz)   01/05/22 1939 98.8 F (37.1 C) 62 18 106/58 74 MM HG 100 % --   01/05/22 1555 98.5 F (36.9 C) 68 16 103/58 76 MM HG 97 % --   01/05/22 1209 97.6 F (36.4 C) 61 18 136/68 93 MM HG 99 % --   01/05/22 0747 97.7 F (36.5 C) 69 17 133/62 89 MM HG 95 % --     Intake/Output Summary (Last 24 hours) at 01/06/2022 0648  Last data filed at 01/05/2022 2320  Gross per 24 hour   Intake 1279 ml   Output --   Net 1279 ml     Positives are bolded.    Gen: Alert. NAD.  CV: RRR. Normal S1/S2. No m/r/g.  Pulm: CTAB. No crackles/wheezes/rhonchi. No accessory muscle use.  Abdomen: Soft, ND. Normal BS. Epigastric tenderness to palpation.  Ext: No c/c/e.  Neuro: A/A/O x4.  Skin: No petechiae. No rash. Warm/dry/intact.  Psych: Appropriate mood and affect.    Labwork and  Ancillary Studies:     Labs/Studies reviewed:    Most Recent CBC:  Lab Results   Component Value Date/Time    WBC 9.6 01/06/2022 01:00 AM    RBC 3.87 01/06/2022 01:00 AM    HEMOGLOBIN 9.6 (L) 01/06/2022 01:00 AM    HCT 27.4 (L) 01/06/2022 01:00 AM    MCV 71 (L) 01/06/2022 01:00 AM    MCH 25 (L) 01/06/2022 01:00 AM    MCHC 35 01/06/2022 01:00 AM    RDW 19.2 (H) 01/06/2022 01:00 AM    PLATELET 237 01/06/2022 01:00 AM    MPV 10.3 01/06/2022 01:00 AM     Most Recent BMP:  Lab Results   Component Value Date    NA 136 01/06/2022    POTASSIUM 4.1 01/06/2022    CHLORIDE 100 01/06/2022    CO2 24 01/06/2022    BUN 7 01/06/2022    CREAT 0.5 01/06/2022    GLUCOSE 99 01/06/2022    CALCIUM 8.9 01/06/2022    MAGNESIUM 1.8 01/06/2022    PHOSPHORUS 4.8 (H) 01/06/2022     Most Recent Liver Function:  Lab Results   Component Value Date/Time    SGOTAST 20 12/29/2021 03:22 PM    SGPTALT 6 12/29/2021 03:22 PM    ALKPHOS 62 12/29/2021 03:22 PM    BILIT 0.3 12/29/2021 03:22 PM    BILID <0.2 12/29/2021 03:22 PM    TOTPR 8.4 (H) 12/29/2021 03:22 PM    ALBUMIN 4.4 12/29/2021 03:22 PM     Most Recent Coagulation:  No results found for: "APTT", "PT", "INR"   Most Recent Lipid Panel:  Lab Results   Component Value Date/Time    TRIGLYCERIDE 48 12/30/2021 07:57 AM     Most Recent HbA1C/Microalbuminuria:  Lab Results   Component Value Date/Time    CREATUMGDL 63 05/26/2021 02:35 PM     Most Recent HIV/Hep C:  No results found for: "HIVABAG", "HIVINTERP", "HEPCAB"    Imaging/Studies    CT ABD/PELVIS-IV ONLY 01/06/23  Impression    Addendum:    The pancreatic head collection appears to be surrounded with pancreatic tissue and therefore representing walled-off necrosis.    Interval increase in size of homogeneous hypodense  pancreatic head cystic lesion 4.8 cm (previously 2.7 cm on 11/25/2021), the lesion has been present since 05/29/2020, presumably pseudocyst.  -Perhaps minimal increase peripancreatic stranding suggesting early recurrent  pancreatitis.    [place] J Tyrrell, MD,RES  [facility] Internal Medicine PGY-1  Available Via Epic Chat  01/06/2022, 6:48 AM    Electronically signed by Terrall Laity, MD at 01/17/2022  2:03 PM EST

## 2022-01-06 NOTE — Care Plan (Signed)
Formatting of this note might be different from the original.    Problem: Adult Inpatient Plan of Care  Goal: Plan of Care Review  Outcome: Progressing  Goal: Patient-Specific Goal (Individualized)  Outcome: Progressing  Goal: Absence of Hospital-Acquired Illness or Injury  Outcome: Progressing  Intervention: Identify and Manage Fall Risk  Flowsheets (Taken 01/05/2022 2304)  Safety Promotion/Fall Prevention:   assistive device/personal items within reach   clutter-free environment maintained   fall prevention program maintained   nonskid shoes/slippers when out of bed  Intervention: Prevent Skin Injury  Flowsheets  Taken 01/05/2022 2304 by Earley Brooke, RN  Body Position: position changed independently  Taken 01/05/2022 0405 by Eusebio Me C  Skin Protection: adhesive use limited  Intervention: Prevent and Manage VTE (Venous Thromboembolism) Risk  Flowsheets (Taken 01/05/2022 1846 by Basilio Cairo, RN)  VTE Prevention/Management: SCDs (sequential compression devices) off  Intervention: Prevent Infection  Flowsheets (Taken 01/04/2022 2033 by Joylene Igo, LPN)  Infection Prevention:   environmental surveillance performed   hand hygiene promoted   personal protective equipment utilized   rest/sleep promoted   single patient room provided  Goal: Optimal Comfort and Wellbeing  Outcome: Progressing  Intervention: Monitor Pain and Promote Comfort  Flowsheets (Taken 01/05/2022 2304)  Pain Management Interventions:   care clustered   pain management plan reviewed with patient/caregiver   pain medication given  Intervention: [place] (Taken 01/05/2022 2304)  Trust Relationship/Rapport:   care explained   choices provided   thoughts/feelings acknowledged   reassurance provided   questions encouraged   questions answered  Goal: Readiness for Transition of Care  Outcome: Progressing    Problem: Fall Prevention  Goal: Prevent/Manage Accidental Injury (Falls)  Description:  Consider requesting a pharmacologic review as needed.  Consider asking the MD to consult PT / OT for an evaluation.  Outcome: Progressing    Problem: Pain Acute  Goal: Optimal Pain Control and Function  Outcome: Progressing  Intervention: Develop Pain Management Plan  Flowsheets (Taken 01/05/2022 2304)  Pain Management Interventions:   care clustered   pain management plan reviewed with patient/caregiver   pain medication given  Intervention: Prevent or Manage Pain  Flowsheets (Taken 01/04/2022 2033 by Joylene Igo, LPN)  Sleep/Rest Enhancement: awakenings minimized  Sensory Stimulation Regulation:   care clustered   lighting decreased  Medication Review/Management: medications reviewed  Intervention: [place]  Taken 01/05/2022 2304 by Earley Brooke, RN  Diversional Activities:   smartphone   television  Taken 01/04/2022 2033 by Joylene Igo, LPN  Supportive Measures:   active listening utilized   relaxation techniques promoted   positive reinforcement provided    Problem: Surgery Nonspecified  Goal: Absence of Bleeding  Outcome: Progressing  Goal: Effective Bowel Elimination  Outcome: Progressing  Goal: Fluid and Electrolyte Balance  Outcome: Progressing  Goal: Blood Glucose Level Within Targeted Range  Outcome: Progressing  Goal: Absence of Infection Signs and Symptoms  Outcome: Progressing  Goal: Anesthesia/Sedation Recovery  Outcome: Progressing  Goal: Optimal Pain Control and Function  Outcome: Progressing  Goal: Nausea and Vomiting Relief  Outcome: Progressing  Goal: Effective Urinary Elimination  Outcome: Progressing  Goal: Effective Oxygenation and Ventilation  Outcome: Progressing    Electronically signed by Earley Brooke, RN at 01/06/2022 12:19 AM EST

## 2022-01-06 NOTE — Progress Notes (Signed)
Formatting of this note is different from the original.  Images from the original note were not included.  Date: 01/06/2022  Time: 7:13 AM    Scan Patient Arm Band: [id number]    Primary Dx: Pancreatitis [K85.90]  Patient LOS: 8  Expected Discharge Date: Jan 04, 2022      Plan of Care:    IV Drips:     All drips have been reviewed    CVL / PIV Lines:     Patient Lines/Drains/Airways Status       Active Peripheral Venous Line / Central Venous Line / Arterial Line / Left Atrial Line / Epidural Line / Airway / Subcutaneous Line / Drain / PIV Line / Intraosseous Line       Name Placement date Placement time Site Days Last dressing change    PIV: 12/29/21 1536 20 gauge Forearm Anterior;Left;Proximal 12/29/21  1536  Forearm  7                Dressing Dates , CHG Bath needs and patient presentation has been reviewed.    ACTIVE Foley Orders :     Foley 8-step care has been reviewed    Foley INDICATION has been reviewed     ACTIVE TELE order     Active Telemetry Orders (From admission, onward)      None         Tele Orders have been confirmed and reviewed.     ACTIVE Wounds     Patient Lines/Drains/Airways Status       Active Wound / Wound Vac Therapy / Rash / Burns       Name Placement date Placement time Site Days Additional Info Last dressing change    Incision: Abdomen 02/10/21 1051 02/10/21  1051  Abdomen  Trocar Sites X 4  329 Pre-existing: No                 Wound care Orders have been confirmed and reviewed.   LDA & Skin-man have been updated and reviewed for accuracy    Labs     Recent Labs     01/06/22  0100 01/05/22  1016 01/05/22  0255 01/04/22  0109   NA 136 133 131* 136   POTASSIUM 4.1 5.2 4.2 4.2   CREAT 0.5 0.5 0.4* 0.5   GLUCOSE 99 131* 142* 91   CALCIUM 8.9 10.3 10.2 9.7   MAGNESIUM 1.8  --  1.7 1.8   PHOSPHORUS 4.8*  --  2.5 4.5     H/H:   Lab Results   Component Value Date    HEMOGLOBIN 9.6 (L) 01/06/2022    HCT 27.4 (L) 01/06/2022     CPK: No results found for: "CPK"  Labs and Electrolyte  Replacement protocols have been confirmed and reviewed.     Restraints     Restraint Orders have been reviewed for accuracy.     Communication Whiteboard updated, Bedside shift Report Completed. The Above orders and Lab results have been reviewed with off going nurse by Basilio Cairo, RN          Electronically signed by Basilio Cairo, RN at 01/06/2022  7:14 AM EST

## 2022-01-06 NOTE — Progress Notes (Signed)
Formatting of this note might be different from the original.  Patient has discharged from the unit.    Discharge instructions were provided and reviewed at the bedside with the patient. The patient is aware that she is to pick up her medications from the inpatient pharmacy at [facility]. She has also been provided a prescription to take to the pharmacy in [facility].    Patient is awaiting a taxi cab transport.  Electronically signed by Basilio Cairo, RN at 01/06/2022  6:25 PM EST

## 2022-01-06 NOTE — Progress Notes (Signed)
Formatting of this note might be different from the original.  I, Lilyan Gilford, MD  have interviewed and examined Rhonda Hammond and discussed his care with the resident team. I have reviewed the chart on Rhonda Hammond and I agree with the documentation as charted. Abd pain sl better but still req IV narx. Tol PO.    BP 102/50   Pulse 58   Temp 98.6 F (37 C)   Resp 18   Ht 5\' 5"  (1.651 m)   Wt 96.3 kg (212 lb 4.9 oz)   SpO2 95%   BMI 35.33 kg/m     NAD  A/A    A/P    Acute on chronic pancreatitis with pseudocyst and necrosis  Acute on chronic anemia  Pain related to #1    Monitor on PO meds today - if cont to have pain will plan to reimage and update lipase    [name], MD  [facility] Internal Medicine  [phone] (o)        Electronically signed by Lilyan Gilford, MD at 01/06/2022  9:31 AM EST

## 2022-01-08 ENCOUNTER — Inpatient Hospital Stay
Admit: 2022-01-08 | Discharge: 2022-01-09 | Disposition: A | Discharge status: Home / Self Care | Payer: PRIVATE HEALTH INSURANCE | Attending: Emergency Medicine

## 2022-01-08 ENCOUNTER — Emergency Department: Payer: PRIVATE HEALTH INSURANCE | Primary: Family Medicine

## 2022-01-08 ENCOUNTER — Emergency Department: Admit: 2022-01-08 | Payer: PRIVATE HEALTH INSURANCE | Primary: Family Medicine

## 2022-01-08 DIAGNOSIS — R112 Nausea with vomiting, unspecified: Secondary | ICD-10-CM

## 2022-01-08 LAB — POCT URINALYSIS DIPSTICK
Bilirubin, Urine: NEGATIVE
Glucose, Ur: NEGATIVE mg/dl
Ketones, Urine: NEGATIVE mg/dl
Nitrite, Urine: NEGATIVE
Protein, Urine: NEGATIVE mg/dl
Specific Gravity, Urine: 1.01 (ref 1.005–1.030)
Urobilinogen, Urine: 0.2 EU/dl (ref 0.0–1.0)
pH, Urine: 6 (ref 5–9)

## 2022-01-08 LAB — COMPREHENSIVE METABOLIC PANEL
ALT: 16 U/L (ref 10–49)
AST: 30 U/L (ref 0.0–33.9)
Albumin: 4.4 gm/dl (ref 3.4–5.0)
Alkaline Phosphatase: 50 U/L (ref 46–116)
Anion Gap: 5 mmol/L (ref 5–15)
BUN: <5 mg/dl — ABNORMAL LOW (ref 9–23)
CO2: 26 mEq/L (ref 20–31)
Calcium: 9.8 mg/dl (ref 8.7–10.4)
Chloride: 104 mEq/L (ref 98–107)
Creatinine: 0.46 mg/dl — ABNORMAL LOW (ref 0.55–1.02)
GFR African American: >60
GFR Non-African American: >60
Glucose: 103 mg/dl (ref 74–106)
Potassium: 4.1 mEq/L (ref 3.5–5.1)
Sodium: 135 mEq/L — ABNORMAL LOW (ref 136–145)
Total Bilirubin: 0.3 mg/dl (ref 0.30–1.20)
Total Protein: 8.4 gm/dl — ABNORMAL HIGH (ref 5.7–8.2)

## 2022-01-08 LAB — CBC WITH AUTO DIFFERENTIAL
Anisocytosis: 1
Atypical Lymphocytes: 2 % — ABNORMAL HIGH (ref 0–0)
CODOCYTES, CODOCYT: 2
Eosinophils: 6 % — ABNORMAL HIGH (ref 0–5)
Hematocrit: 31 % — ABNORMAL LOW (ref 35.0–47.0)
Hemoglobin: 10.3 gm/dl — ABNORMAL LOW (ref 11.0–16.0)
Immature Granulocytes: 0.5 % (ref 0.0–3.0)
Lymphocytes: 30 % (ref 28–48)
MCH: 24.3 pg — ABNORMAL LOW (ref 25.4–34.6)
MCHC: 33.2 gm/dl (ref 30.0–36.0)
MCV: 73.3 fL — ABNORMAL LOW (ref 80.0–98.0)
MPV: 10.9 fL — ABNORMAL HIGH (ref 6.0–10.0)
Monocytes: 11 % (ref 1–13)
Neutrophils Segmented: 51 % (ref 34–64)
Nucleated RBCs: 0 (ref 0–0)
Platelet Appearance: NORMAL
Platelets: 248 10*3/uL (ref 140–450)
Polychromasia: 1
RBC: 4.23 M/uL (ref 3.60–5.20)
RDW: 49.7 — ABNORMAL HIGH (ref 36.4–46.3)
WBC: 6.6 10*3/uL (ref 4.0–11.0)

## 2022-01-08 LAB — POC PREGNANCY UR-QUAL: Pregnancy, Urine: NEGATIVE

## 2022-01-08 LAB — MICROSCOPIC URINALYSIS

## 2022-01-08 LAB — LIPASE: Lipase: 33 U/L (ref 12–53)

## 2022-01-08 MED ORDER — MORPHINE SULFATE 4 MG/ML IJ SOLN
4 MG/ML | INTRAMUSCULAR | Status: AC
Start: 2022-01-08 — End: 2022-01-08
  Administered 2022-01-08: 22:00:00 4 mg via INTRAVENOUS

## 2022-01-08 MED ORDER — MORPHINE SULFATE 4 MG/ML IJ SOLN
4 MG/ML | INTRAMUSCULAR | Status: AC
Start: 2022-01-08 — End: 2022-01-08
  Administered 2022-01-08: 4 mg via INTRAVENOUS

## 2022-01-08 MED FILL — MORPHINE SULFATE 4 MG/ML IJ SOLN: 4 mg/mL | INTRAMUSCULAR | Qty: 1

## 2022-01-08 NOTE — ED Provider Notes (Signed)
[facility]  Emergency Department Treatment Report    Patient: Rhonda Hammond Age: 31 y.o. Sex: female    Date of Birth: August 14, 1990 Admit Date: 01/08/2022 PCP: Lisbeth Renshaw, MD   MRN: [id number]  CSN: [id number]  ATTENDING: Lindajo Royal, MD    Room: [phone] Time Dictated: 11:37 PM APP: Larinda Buttery, PA-C     Chief Complaint   Chief Complaint   Patient presents with    Abdominal Pain       History of Present Illness   31 y.o. female past medical history significant for alcohol induced chronic pancreatitis, status EGD stent placement 01/04/2022 presenting to the emergency department for abdominal pain.  Patient states after she had surgery the pain was severe while she was in the hospital but states that it improved slightly when she first got home.  She states over the last 2 days the pain has become unbearable.  She also had an episode of nausea with vomiting this morning and reports taking a Zofran with some relief.  She states she was sent home with oxycodone was instructed to take 1 pill for pain as needed states she has been having to take 2 at a time which only provides relief for about an hour.  Her last dose of oxycodone was at 9 AM.  She describes the pain is localized to her upper abdomen and states that it has been constant for the last couple of hours.  It is an aching and sharp pain.  Patient dates she has not had a bowel movement since Friday night.  Denies fevers, chest pain, shortness of breath, irritative voiding symptoms or hematuria. She is currently on her menstrual cycle.    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     Intractable nausea and vomiting 06/26/2020    Obesity 12/16/2018    Pancreatitis      Past Surgical History:   Procedure Laterality Date     CESAREAN SECTION      CHOLECYSTECTOMY      UPPER GASTROINTESTINAL ENDOSCOPY N/A 10/12/2021    EGD DIAGNOSTIC performed by Sharman Crate, MD at [facility]       Social History     Social History     Socioeconomic History    Marital status: Single   Tobacco Use    Smoking status: Every Day     Types: Cigars   Substance and Sexual Activity    Alcohol use: Yes     Alcohol/week: 21.0 standard drinks of alcohol     Types: 21 Shots of liquor per week     Comment: 2-3 drinks a day, sometimes more    Drug use: Yes     Types: Marijuana Sherrie Mustache)     Social Determinants of Health     Food Insecurity: No Food Insecurity (12/16/2021)    Hunger Vital Sign     Worried About Running Out of Food in the Last Year: Never true     Ran Out of Food in the Last Year: Never true   Transportation Needs: No Transportation Needs (12/16/2021)    PRAPARE - Transportation     Lack of Transportation (Medical): No     Lack of  Transportation (Non-Medical): No   Housing Stability: Low Risk  (12/16/2021)    Housing Stability Vital Sign     Unable to Pay for Housing in the Last Year: No     Number of Places Lived in the Last Year: 1     Unstable Housing in the Last Year: No       Family History     Family History   Problem Relation Age of Onset    No Known Problems Mother        Current Medications     Discharge Medication List as of 01/08/2022  8:50 PM        CONTINUE these medications which have NOT CHANGED    Details   pantoprazole (PROTONIX) 40 MG tablet Take 1 tablet by mouth 2 times daily (before meals), Disp-60 tablet, R-1Normal      !! ondansetron (ZOFRAN) 4 MG tablet Take 1 tablet by mouth every 8 hours as needed for Nausea or Vomiting, Disp-20 tablet, R-0Normal      albuterol sulfate HFA (PROVENTIL;VENTOLIN;PROAIR) 108 (90 Base) MCG/ACT inhaler Inhale 2 puffs into the lungs every 6 hours as needed for Wheezing, Disp-18 g, R-3Normal      !! ondansetron (ZOFRAN) 4 MG tablet Take 1 tablet by mouth every 8 hours as needed for Nausea or Vomiting,  Disp-12 tablet, R-0Normal      albuterol (ACCUNEB) 0.63 MG/3ML nebulizer solution Inhale 0.63 mg into the lungs every 6 hours as neededHistorical Med       !! - Potential duplicate medications found. Please discuss with provider.            Allergies   Droperidol      Physical Exam     ED Triage Vitals [01/08/22 1430]   BP Temp Temp Source Pulse Respirations SpO2 Height Weight - Scale   114/75 98.4 F (36.9 C) Oral 83 20 100 % 1.651 m (_0 ) 93.9 kg (207 lb)           Physical Exam  Constitutional:       General: She is not in acute distress.     Appearance: She is well-developed. She is not ill-appearing or toxic-appearing.      Comments: Tearful after abdominal exam   Cardiovascular:      Rate and Rhythm: Normal rate and regular rhythm.      Heart sounds: No murmur heard.     No friction rub. No gallop.   Pulmonary:      Effort: Pulmonary effort is normal. No respiratory distress.      Breath sounds: Normal breath sounds. No stridor. No wheezing, rhonchi or rales.   Abdominal:      General: Abdomen is flat. There is no distension.      Palpations: Abdomen is soft.      Tenderness: There is abdominal tenderness in the right upper quadrant, epigastric area and left upper quadrant. There is guarding (Voluntary).   Musculoskeletal:      Cervical back: Normal range of motion and neck supple.   Skin:     General: Skin is warm and dry.      Coloration: Skin is not jaundiced or pale.      Findings: No bruising.   Neurological:      General: No focal deficit present.      Mental Status: She is alert and oriented to person, place, and time.         Impression and Management Plan   Patient is a  31 year old female presenting emergency department for severe upper abdominal pain.  On exam she is tearful after palpation of the upper quadrants.  Will order basic labs, CT abdomen pelvis and treat symptoms.      DDx: Including but not limited to exacerbation of chronic pancreatitis versus perforation versus pancreatic cyst versus  stent obstruction versus postoperative pain versus menstrual cramping versus post EGD infection    Diagnostic Studies     Results for orders placed or performed during the hospital encounter of 01/08/22   CT ABDOMEN PELVIS W IV CONTRAST Additional Contrast? None    Narrative    All CT exams at this facility use one or more dose reduction techniques  including automatic exposure control, mA/kV adjustment per patient's size, or  iterative reconstruction technique.    TECHNIQUE:   CT of the abdomen and pelvis WITH intravenous contrast.  The abdomen and pelvis  were scanned utilizing a multidetector helical scanner from the diaphragm to the  lesser trochanter without the oral administration of barium.  Coronal and  sagittal reformations were obtained.      COMPARISON: CT abdomen pelvis  12/15/2021    INDICATION: Upper abdominal pain post EGD stent placement      DISCUSSION:          LOWER THORAX: Normal.    HEPATOBILIARY: No focal hepatic lesions.  Gallbladder removed. No biliary ductal  dilatation.  SPLEEN: No splenomegaly or focal lesion.  PANCREAS: Fluid collection in pancreatic head significantly smaller now  measuring 2.6 x 1.4 cm contained within a pancreatic stent that communicates  with the second portion of the duodenum. Mild pancreatic ductal dilatation 6 mm  diameter similar to prior study with decreased pancreatic diffuse enlargement  and peripancreatic fat stranding/retroperitoneal fluid from prior study.    ADRENALS: No adrenal nodules.  KIDNEYS/URETERS: No hydronephrosis, stones, or solid mass lesions.  PELVIC ORGANS/BLADDER: Unremarkable.    PERITONEUM / RETROPERITONEUM: No free air or fluid.  LYMPH NODES: No lymphadenopathy.  VESSELS: Unremarkable.    GI TRACT: No distention or wall thickening. No evidence of appendicitis. Large  amount fecal material in colon and rectum.    BONES AND SOFT TISSUES: Unremarkable.      Impression    IMPRESSION:    1. Cholecystectomy and pancreatic stent placement.  2.  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 Residual pancreatic ductal dilatation stable.  Decreased acute pancreatitis.    3. Constipation.    Electronically signed by: Debby Bud, MD 01/08/2022 8:10 PM EST            Workstation ID: [id number]     CBC with Diff   Result Value Ref Range    WBC 6.6 4.0 - 11.0 1000/mm3    Neutrophils Segmented 51.0 34 - 64 %    RBC 4.23 3.60 - 5.20 M/uL    Lymphocytes 30.0 28 - 48 %    Hemoglobin 10.3 (L) 11.0 - 16.0 gm/dl    Monocytes 11.0 1 - 13 %    Hematocrit 31.0 (L) 35.0 - 47.0 %    Eosinophils 6.0 (H) 0 - 5 %    MCV 73.3 (L) 80.0 - 98.0 fL    MCH 24.3 (L) 25.4 - 34.6 pg    MCHC 33.2 30.0 - 36.0 gm/dl    Platelets 248 140 - 450 1000/mm3    MPV 10.9 (H) 6.0 - 10.0 fL    RDW 49.7 (H) 36.4 - 46.3  Atypical Lymphocytes 2.0 (H) 0 - 0 %    Nucleated RBCs 0 0 - 0      Immature Granulocytes 0.5 0.0 - 3.0 %    Anisocytosis 1      Polychromasia 1      CODOCYTES, CODOCYT 2      Platelet Appearance NORMAL     CMP   Result Value Ref Range    Potassium 4.1 3.5 - 5.1 mEq/L    Chloride 104 98 - 107 mEq/L    Sodium 135 (L) 136 - 145 mEq/L    CO2 26 20 - 31 mEq/L    Glucose 103 74 - 106 mg/dl    BUN <5 (L) 9 - 23 mg/dl    Creatinine 0.46 (L) 0.55 - 1.02 mg/dl    GFR African American >60.0      GFR Non-African American >60      Calcium 9.8 8.7 - 10.4 mg/dl    Anion Gap 5 5 - 15 mmol/L    AST 30.0 0.0 - 33.9 U/L    ALT 16 10 - 49 U/L    Alkaline Phosphatase 50 46 - 116 U/L    Total Bilirubin 0.30 0.30 - 1.20 mg/dl    Total Protein 8.4 (H) 5.7 - 8.2 gm/dl    Albumin 4.4 3.4 - 5.0 gm/dl   Lipase   Result Value Ref Range    Lipase 33 12 - 53 U/L   Microscopic Urinalysis   Result Value Ref Range    Squam Epithel, UA 10-14 /LPF    WBC, UA OCCASIONAL /HPF    RBC, UA 10-14 /HPF   POCT Urinalysis no Micro   Result Value Ref Range    Glucose, Ur Negative NEGATIVE,Negative mg/dl    Bilirubin, Urine Negative NEGATIVE,Negative      Ketones,  Urine Negative NEGATIVE,Negative mg/dl    Specific Gravity, Urine 1.010 1.005 - 1.030      Blood, Urine Large (A) NEGATIVE,Negative      pH, Urine 6.0 5 - 9      Protein, Urine Negative NEGATIVE,Negative mg/dl    Urobilinogen, Urine 0.2 0.0 - 1.0 EU/dl    Nitrite, Urine Negative NEGATIVE,Negative      Leukocyte Esterase, Urine Trace (A) NEGATIVE,Negative      Color, UA Yellow      Clarity, UA Slightly Cloudy     POC Pregnancy Urine Qual   Result Value Ref Range    Pregnancy, Urine negative NEGATIVE,Negative,negative         Imaging studies independently interpreted by myself and Lindajo Royal, MD. My interpretation is CT abdomen pelvis demonstrates no free air under the diaphragm, nonobstructive bowel gas pattern.  ED Course/Medical Decision Making   Noted delay in CT scan, informed by CT that patient's IV not functional upon arrival for scan.  Patient states she has received no pain relief, IV is infiltrated, likely the patient did not receive full dose of medication.  Will order additional dose of morphine.    After receiving morphine patient states that she is still experiencing pain, discussed with patient that we will manage her pain in the emergency department and she will need to follow-up with her GI and primary care for long-term management.  Patient demonstrates understanding and is agreeable to this plan.  Patient denies nausea.  IV Dilaudid ordered.    CT abdomen pelvis demonstrates significant decrease in size of pancreatic head pseudocyst, residual pancreatic ductal dilatation is stable and there is decreased acute pancreatitis.  Patient states her pain is now controlled after receiving additional pain medication.  Will provide the patient with a prescription for Norco and instructed her to follow-up with her primary care as she has an appointment scheduled on the 27th at this time.  Also instructed to follow-up with her GI group if her pain continues.  Provided return precautions.  Patient is  agreeable to this plan and demonstrates understanding.    Patient remained stable in the emergency department, did not develop symptoms, informed of results, and discharged in stable condition.      ED Course as of 01/08/22 2337   Nancy Fetter Jan 08, 2022   1643 POCT Urinalysis no Micro(!):    Glucose, UA Negative   Bilirubin, Urine Negative   Ketones, Urine Negative   Specific Gravity, Urine 1.010   Blood, Urine Large(!)   pH, Urine 6.0   Protein, Urine Negative   Urobilinogen, Urine 0.2   Nitrite, Urine Negative   Leukocyte Esterase, Urine Trace(!)   Color, UA Yellow   Clarity, UA Slightly Cloudy [EH]   1643 POC Pregnancy Urine Qual:    Pregnancy, Urine negative [EH]   1658 CBC with Diff(!):    WBC 6.6   RBC 4.23   Hemoglobin Quant 10.3(!)   Hematocrit 31.0(!)   MCV 73.3(!)   MCH 24.3(!)   MCHC 33.2   Platelet Count 248   MPV 10.9(!)   RDW 49.7(!) [EH]   1723 Lipase:    Lipase 33 [EH]   1723 CMP(!):    Potassium 4.1   Chloride 104   Sodium 135(!)   CO2 26   Glucose, Random 103   BUN,BUNPL <5(!)   Creatinine 0.46(!)   GFR African American >60.0   GFR Non-African American >60   CALCIUM, SERUM, 500694 9.8   Anion Gap 5   AST 30.0   ALT 16   Alk Phos 50   BILIRUBIN TOTAL 0.30   Total Protein 8.4(!)   Albumin 4.4 [EH]      ED Course User Index  [EH] Sherri Rad, [place]        RECORDS REVIEWED:  I reviewed the patient's previous records here at [facility] and available outside facilities and note that patient was admitted to [facility] December 29, 2021 and had EGD to place stent 01/04/22. Was provided with a paper prescription for oxycodone and instructed to fill it at the [facility].    EXTERNAL RESULTS REVIEWED: Hemoglobin 01/06/2022 of 9.6, BMP within normal limits.    INDEPENDENT HISTORIAN:  History and/or plan development assisted by: none    Severe exacerbation or progression of chronic illness:       Threat to body function without evaluation and management: GI      SOCIAL DETERMINANTS  impacting  Evaluation and Management: Provider availability, stress, health literacy      Comorbidities impacting Evaluation and Management: chronic pancreatitis, s/p pancreatic cyst removal 01/04/22      Medications   morphine injection 4 mg (4 mg IntraVENous Given 01/08/22 1655)   morphine injection 4 mg (4 mg IntraVENous Given 01/08/22 1841)   iopamidol (ISOVUE-300) 61 % injection 80 mL (80 mLs IntraVENous Given 01/08/22 1938)   HYDROmorphone (DILAUDID) injection 1 mg (1 mg IntraVENous Given 01/08/22 2033)     Final Diagnosis     1. Nausea and vomiting, unspecified vomiting type    2. Presence of pancreatic duct stent    3. History of chronic pancreatitis    4.  Pain of upper abdomen        Disposition   Discharge    Larinda Buttery, PA-C  January 08, 2022    The patient was personally evaluated by myself and Lindajo Royal, MD who agrees with the above assessment and plan.    My signature above authenticates this document and my orders, the final diagnosis (es), discharge prescription (s), and instructions in the Epic record. If you have any questions please contact [phone].     Nursing notes have been reviewed by the physician/ advanced practice clinician.    Dragon medical dictation software was used for portions of this report. Unintended voice recognition errors may occur.                           Larinda Buttery [place], [place]  01/08/22 [phone]

## 2022-01-08 NOTE — ED Notes (Signed)
Introduced myself to pt.  Pt sitting upright in chair.  Pt A+Ox4  Pt medicated per MAR  Pt c/o abd pain since Friday   No further needs a this time       [name], LPN  76/73/41 [id number]

## 2022-01-08 NOTE — ED Notes (Signed)
Pt OTF to CT     [name], LPN  38/25/05 [id number]

## 2022-01-08 NOTE — ED Notes (Signed)
Rounded on pt.  Pt medicated per MAR  Pt resting watching TV.  No further needs at this time     [name], LPN  62/95/28 [phone]

## 2022-01-08 NOTE — ED Notes (Signed)
IV removed  Discharge papers given and reviewed with patient.     Questions answered.     Pt had no questions for the nurse.    Ambulatory out of department.       [name], LPN  86/76/72 0947

## 2022-01-08 NOTE — ED Triage Notes (Signed)
Pt arrives ambulatory through triage with c/o of abdominal pain and vomiting. Pt had pancreatic cyst removed Wednesday and has been in severe pain since with vomiting.

## 2022-01-08 NOTE — ED Notes (Signed)
Pt medicated per MAR.  Pt resting watching TV, no further needs at this time.     [name], LPN  86/76/72 0947

## 2022-01-08 NOTE — Discharge Instructions (Addendum)
Take medications as prescribed.  Utilize over-the-counter MiraLAX for constipation as discussed.  Follow-up with your GI doctor who did your EGD as well as your primary care this week for long-term management.  Return to the emergency department if new or worsening symptoms including but not limited to chest pain, shortness of breath, intractable nausea and vomiting, worsening abdominal pain, or development of fevers.    Results for orders placed or performed during the hospital encounter of 01/08/22   CT ABDOMEN PELVIS W IV CONTRAST Additional Contrast? None    Narrative    All CT exams at this facility use one or more dose reduction techniques  including automatic exposure control, mA/kV adjustment per patient's size, or  iterative reconstruction technique.    TECHNIQUE:   CT of the abdomen and pelvis WITH intravenous contrast.  The abdomen and pelvis  were scanned utilizing a multidetector helical scanner from the diaphragm to the  lesser trochanter without the oral administration of barium.  Coronal and  sagittal reformations were obtained.      COMPARISON: CT abdomen pelvis  12/15/2021    INDICATION: Upper abdominal pain post EGD stent placement      DISCUSSION:          LOWER THORAX: Normal.    HEPATOBILIARY: No focal hepatic lesions.  Gallbladder removed. No biliary ductal  dilatation.  SPLEEN: No splenomegaly or focal lesion.  PANCREAS: Fluid collection in pancreatic head significantly smaller now  measuring 2.6 x 1.4 cm contained within a pancreatic stent that communicates  with the second portion of the duodenum. Mild pancreatic ductal dilatation 6 mm  diameter similar to prior study with decreased pancreatic diffuse enlargement  and peripancreatic fat stranding/retroperitoneal fluid from prior study.    ADRENALS: No adrenal nodules.  KIDNEYS/URETERS: No hydronephrosis, stones, or solid mass lesions.  PELVIC ORGANS/BLADDER: Unremarkable.    PERITONEUM / RETROPERITONEUM: No free air or fluid.  LYMPH NODES:  No lymphadenopathy.  VESSELS: Unremarkable.    GI TRACT: No distention or wall thickening. No evidence of appendicitis. Large  amount fecal material in colon and rectum.    BONES AND SOFT TISSUES: Unremarkable.      Impression    IMPRESSION:    1. Cholecystectomy and pancreatic stent placement.  2. 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 Residual pancreatic ductal dilatation stable.  Decreased acute pancreatitis.    3. Constipation.    Electronically signed by: Debby Bud, MD 01/08/2022 8:10 PM EST            Workstation ID: [id number]     CBC with Diff   Result Value Ref Range    WBC 6.6 4.0 - 11.0 1000/mm3    Neutrophils Segmented 51.0 34 - 64 %    RBC 4.23 3.60 - 5.20 M/uL    Lymphocytes 30.0 28 - 48 %    Hemoglobin 10.3 (L) 11.0 - 16.0 gm/dl    Monocytes 11.0 1 - 13 %    Hematocrit 31.0 (L) 35.0 - 47.0 %    Eosinophils 6.0 (H) 0 - 5 %    MCV 73.3 (L) 80.0 - 98.0 fL    MCH 24.3 (L) 25.4 - 34.6 pg    MCHC 33.2 30.0 - 36.0 gm/dl    Platelets 248 140 - 450 1000/mm3    MPV 10.9 (H) 6.0 - 10.0 fL    RDW 49.7 (H) 36.4 - 46.3      Atypical Lymphocytes 2.0 (H) 0 -  0 %    Nucleated RBCs 0 0 - 0      Immature Granulocytes 0.5 0.0 - 3.0 %    Anisocytosis 1      Polychromasia 1      CODOCYTES, CODOCYT 2      Platelet Appearance NORMAL     CMP   Result Value Ref Range    Potassium 4.1 3.5 - 5.1 mEq/L    Chloride 104 98 - 107 mEq/L    Sodium 135 (L) 136 - 145 mEq/L    CO2 26 20 - 31 mEq/L    Glucose 103 74 - 106 mg/dl    BUN <5 (L) 9 - 23 mg/dl    Creatinine 2.29 (L) 0.55 - 1.02 mg/dl    GFR African American >60.0      GFR Non-African American >60      Calcium 9.8 8.7 - 10.4 mg/dl    Anion Gap 5 5 - 15 mmol/L    AST 30.0 0.0 - 33.9 U/L    ALT 16 10 - 49 U/L    Alkaline Phosphatase 50 46 - 116 U/L    Total Bilirubin 0.30 0.30 - 1.20 mg/dl    Total Protein 8.4 (H) 5.7 - 8.2 gm/dl    Albumin 4.4 3.4 - 5.0 gm/dl   Lipase   Result Value Ref Range    Lipase 33 12 -  53 U/L   Microscopic Urinalysis   Result Value Ref Range    Squam Epithel, UA 10-14 /LPF    WBC, UA OCCASIONAL /HPF    RBC, UA 10-14 /HPF   POCT Urinalysis no Micro   Result Value Ref Range    Glucose, Ur Negative NEGATIVE,Negative mg/dl    Bilirubin, Urine Negative NEGATIVE,Negative      Ketones, Urine Negative NEGATIVE,Negative mg/dl    Specific Gravity, Urine 1.010 1.005 - 1.030      Blood, Urine Large (A) NEGATIVE,Negative      pH, Urine 6.0 5 - 9      Protein, Urine Negative NEGATIVE,Negative mg/dl    Urobilinogen, Urine 0.2 0.0 - 1.0 EU/dl    Nitrite, Urine Negative NEGATIVE,Negative      Leukocyte Esterase, Urine Trace (A) NEGATIVE,Negative      Color, UA Yellow      Clarity, UA Slightly Cloudy     POC Pregnancy Urine Qual   Result Value Ref Range    Pregnancy, Urine negative NEGATIVE,Negative,negative

## 2022-01-09 ENCOUNTER — Inpatient Hospital Stay
Admit: 2022-01-09 | Discharge: 2022-01-09 | Disposition: A | Discharge status: Home / Self Care | Payer: PRIVATE HEALTH INSURANCE | Attending: Emergency Medicine

## 2022-01-09 DIAGNOSIS — R112 Nausea with vomiting, unspecified: Secondary | ICD-10-CM

## 2022-01-09 LAB — POCT URINALYSIS DIPSTICK
Glucose, Ur: NEGATIVE mg/dl
Leukocyte Esterase, Urine: NEGATIVE
Nitrite, Urine: NEGATIVE
Protein, Urine: 30 mg/dl — AB
Specific Gravity, Urine: >=1.03 (ref 1.005–1.030)
Urobilinogen, Urine: 1 EU/dl (ref 0.0–1.0)
pH, Urine: 6 (ref 5–9)

## 2022-01-09 MED ORDER — ONDANSETRON HCL 4 MG/2ML IJ SOLN
4 MG/2ML | Freq: Once | INTRAMUSCULAR | Status: DC
Start: 2022-01-09 — End: 2022-01-09

## 2022-01-09 MED ORDER — ONDANSETRON 4 MG PO TBDP
4 MG | Freq: Once | ORAL | Status: AC
Start: 2022-01-09 — End: 2022-01-09
  Administered 2022-01-09: 19:00:00 4 mg via SUBLINGUAL

## 2022-01-09 MED ORDER — OXYCODONE-ACETAMINOPHEN 7.5-325 MG PO TABS
Freq: Once | ORAL | Status: AC
Start: 2022-01-09 — End: 2022-01-09
  Administered 2022-01-09: 21:00:00 1 via ORAL

## 2022-01-09 MED ORDER — IOPAMIDOL 61 % IV SOLN
61 % | Freq: Once | INTRAVENOUS | Status: AC | PRN
Start: 2022-01-09 — End: 2022-01-08
  Administered 2022-01-09: 01:00:00 80 mL via INTRAVENOUS

## 2022-01-09 MED ORDER — HYDROMORPHONE HCL 1 MG/ML IJ SOLN
1 MG/ML | INTRAMUSCULAR | Status: DC
Start: 2022-01-09 — End: 2022-01-09

## 2022-01-09 MED ORDER — HYDROMORPHONE HCL 1 MG/ML IJ SOLN
1 MG/ML | Freq: Once | INTRAMUSCULAR | Status: AC
Start: 2022-01-09 — End: 2022-01-09
  Administered 2022-01-09: 19:00:00 1 mg via INTRAMUSCULAR

## 2022-01-09 MED ORDER — HYDROMORPHONE HCL 1 MG/ML IJ SOLN
1 MG/ML | INTRAMUSCULAR | Status: AC
Start: 2022-01-09 — End: 2022-01-08
  Administered 2022-01-09: 02:00:00 1 mg via INTRAVENOUS

## 2022-01-09 MED ORDER — HYDROCODONE-ACETAMINOPHEN 5-325 MG PO TABS
5-325 MG | ORAL_TABLET | ORAL | 0 refills | Status: AC | PRN
Start: 2022-01-09 — End: 2022-01-11

## 2022-01-09 MED FILL — HYDROMORPHONE HCL 1 MG/ML IJ SOLN: 1 MG/ML | INTRAMUSCULAR | Qty: 1

## 2022-01-09 MED FILL — ONDANSETRON 4 MG PO TBDP: 4 MG | ORAL | Qty: 1

## 2022-01-09 MED FILL — OXYCODONE-ACETAMINOPHEN 7.5-325 MG PO TABS: ORAL | Qty: 1

## 2022-01-09 MED FILL — ISOVUE-300 61 % IV SOLN: 61 % | INTRAVENOUS | Qty: 80

## 2022-01-09 NOTE — ED Provider Notes (Cosign Needed)
[facility] Care  Emergency Department Treatment Report        Patient: JERRICA SIBOLE Age: 31 y.o. Sex: female    Date of Birth: 11-17-1990 Admit Date: 01/09/2022 PCP: Danny Lawless, MD   MRN: [id number]  CSN: [id number]  Dr. Konrad Felix, MD   Room: [phone] Time Dictated: 3:38 PM            Chief Complaint   Chief Complaint   Patient presents with    Abdominal Pain    Emesis       History of Present Illness   This is a 31 y.o. 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 Percocet for pain, presented yesterday complaining of abdominal pain nausea and vomiting.  She had a CT scan performed yesterday that showed:  1. Cholecystectomy and pancreatic stent placement.  2. 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 Residual pancreatic ductal dilatation stable.  Decreased acute pancreatitis.     3. Constipation.  And was discharged again with Norco.  She presents again today with chief complaint of abdominal pain nausea and vomiting-0 she states it is the "same as it was yesterday "but because of the holiday she was unable to have her narcotic prescription filled.  On my chart review the last filled narcotic prescription for this state was Norco on 7 December.  She denies fever, worsening pain, she states is just persistent and she is unable to control this at home.  Review of Systems   Review of Systems    See HPI  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     Intractable nausea and vomiting  06/26/2020    Obesity 12/16/2018    Pancreatitis      Past Surgical History:   Procedure Laterality Date    CESAREAN SECTION      CHOLECYSTECTOMY      UPPER GASTROINTESTINAL ENDOSCOPY N/A 10/12/2021    EGD DIAGNOSTIC performed by Elberta Spaniel, MD at [facility] ENDOSCOPY       Social History     Social History     Socioeconomic History    Marital status: Single     Spouse name: Not on file    Number of children: Not on file    Years of education: Not on file    Highest education level: Not on file   Occupational History    Not on file   Tobacco Use    Smoking status: Every Day     Types: Cigars    Smokeless tobacco: Not on file   Substance and Sexual Activity    Alcohol use: Yes     Alcohol/week: 21.0 standard drinks of alcohol     Types: 21 Shots of liquor per week     Comment: 2-3 drinks a day, sometimes more    Drug use: Yes     Types: Marijuana Sheran Fava)    Sexual activity: Not on file   Other Topics Concern    Not on file   Social History Narrative  Not on file     Social Determinants of Health     Financial Resource Strain: Not on file   Food Insecurity: No Food Insecurity (12/16/2021)    Hunger Vital Sign     Worried About Running Out of Food in the Last Year: Never true     Ran Out of Food in the Last Year: Never true   Transportation Needs: No Transportation Needs (12/16/2021)    PRAPARE - Therapist, art (Medical): No     Lack of Transportation (Non-Medical): No   Physical Activity: Not on file   Stress: Not on file   Social Connections: Not on file   Intimate Partner Violence: Not on file   Housing Stability: Low Risk  (12/16/2021)    Housing Stability Vital Sign     Unable to Pay for Housing in the Last Year: No     Number of Places Lived in the Last Year: 1     Unstable Housing in the Last Year: No     **  Family History     Family History   Problem Relation Age of Onset    No Known Problems Mother      **  Current Medications     Current Facility-Administered Medications   Medication Dose  Route Frequency Provider Last Rate Last Admin    oxyCODONE-acetaminophen (PERCOCET) 7.5-325 MG per tablet 1 tablet  1 tablet Oral Once Clemon Devaul V, PA-C         Current Outpatient Medications   Medication Sig Dispense Refill    HYDROcodone-acetaminophen (NORCO) 5-325 MG per tablet Take 1 tablet by mouth every 4 hours as needed for Pain for up to 12 doses. Intended supply: 3 days. Take lowest dose possible to manage pain Max Daily Amount: 6 tablets 12 tablet 0    pantoprazole (PROTONIX) 40 MG tablet Take 1 tablet by mouth 2 times daily (before meals) 60 tablet 1    ondansetron (ZOFRAN) 4 MG tablet Take 1 tablet by mouth every 8 hours as needed for Nausea or Vomiting (Patient not taking: Reported on 12/16/2021) 20 tablet 0    albuterol sulfate HFA (PROVENTIL;VENTOLIN;PROAIR) 108 (90 Base) MCG/ACT inhaler Inhale 2 puffs into the lungs every 6 hours as needed for Wheezing (Patient not taking: Reported on 12/16/2021) 18 g 3    ondansetron (ZOFRAN) 4 MG tablet Take 1 tablet by mouth every 8 hours as needed for Nausea or Vomiting (Patient not taking: Reported on 12/16/2021) 12 tablet 0    albuterol (ACCUNEB) 0.63 MG/3ML nebulizer solution Inhale 0.63 mg into the lungs every 6 hours as needed (Patient not taking: Reported on 12/16/2021)         Allergies     Allergies   Allergen Reactions    Droperidol Other (See Comments)     Face twitching, drooling and spontaneous leg movement       Physical Exam   Patient Vitals for the past 24 hrs:   Temp Pulse Resp BP SpO2   01/09/22 1040 98.1 F (36.7 C) 86 20 116/68 98 %     Physical Exam  Vitals and nursing note reviewed.   Constitutional:       General: She is not in acute distress.     Appearance: She is not toxic-appearing.   HENT:      Head: Normocephalic.      Nose: Nose normal.      Mouth/Throat:      Mouth: Mucous  membranes are moist.   Eyes:      Pupils: Pupils are equal, round, and reactive to light.   Cardiovascular:      Rate and Rhythm: Normal rate and regular  rhythm.      Pulses: Normal pulses.   Pulmonary:      Effort: Pulmonary effort is normal.      Breath sounds: Normal breath sounds.   Abdominal:      General: Bowel sounds are normal. There is no distension.      Palpations: Abdomen is soft.      Tenderness: There is abdominal tenderness in the right upper quadrant and epigastric area.      Comments: There is epigastric and right upper quadrant abdominal tenderness on my exam without rigidity rebound or guarding.   Musculoskeletal:         General: Normal range of motion.      Cervical back: Normal range of motion and neck supple.   Skin:     General: Skin is warm and dry.      Capillary Refill: Capillary refill takes less than 2 seconds.   Neurological:      General: No focal deficit present.      Mental Status: She is alert and oriented to person, place, and time.          Impression and Management Plan   31 year old female with abdominal pain, known pancreatic stent, seen here less than 24 hours ago with labs and CT abdomen pelvis that showed improvement of her fluid collection of the pancreatic show stent and stent that was in place.  She was unable to fill her narcotic pain medication due to the holiday after discharge yesterday.  Differential diagnoses acute pancreatitis, sepsis, worsening of her chronic problem, uncontrolled pain due to inability to fill her narcotic pain medication.    Diagnostic Studies   Lab:   Results for orders placed or performed during the hospital encounter of 01/09/22   POCT Urinalysis no Micro   Result Value Ref Range    Glucose, Ur Negative NEGATIVE,Negative mg/dl    Bilirubin, Urine Small (A) NEGATIVE,Negative      Ketones, Urine Trace (A) NEGATIVE,Negative mg/dl    Specific Gravity, Urine >=1.030 1.005 - 1.030      Blood, Urine Moderate (A) NEGATIVE,Negative      pH, Urine 6.0 5 - 9      Protein, Urine 30 (A) NEGATIVE,Negative mg/dl    Urobilinogen, Urine 1.0 0.0 - 1.0 EU/dl    Nitrite, Urine Negative NEGATIVE,Negative       Leukocyte Esterase, Urine Negative NEGATIVE,Negative      Color, UA Dark yellow      Clarity, UA Clear                Other studies:  My interpretation of other studies is that they show, among other things, no urinary tract infection    ED Course         EXTERNAL RECORDS REVIEWED:  I reviewed the patient's previous records here at [facility] and available outside facilities and note that see HPI    PREVIOUS RESULTS REVIEWED: See HPI    INTERPRETATION OF TESTS: See above      CONDITION POSING Threat to body FUNCTION, LIFE, OR LIMB without evaluation and management: Abdominal pain        Comorbidities impacting Evaluation and Management: Pancreatic pseudocyst        Medications   oxyCODONE-acetaminophen (PERCOCET) 7.5-325 MG per tablet 1 tablet (  has no administration in time range)   HYDROmorphone (DILAUDID) injection 1 mg (1 mg IntraMUSCular Given 01/09/22 1414)   ondansetron (ZOFRAN-ODT) disintegrating tablet 4 mg (4 mg SubLINGual Given 01/09/22 1414)           Medical Decision Making   31 year old female presenting with pain that is not worse than it had been but is uncontrolled due to not being able to fill narcotic pain medication that was prescribed after being seen here yesterday due to the holiday.  She has stable vital signs and is not febrile, she has not vomited after my initial evaluation, she was given IM and ODT medications with improvement in her symptoms after initial attempts to obtain PIV access were unsuccessful.  We had an informed decision-making process, with agreement and plan between patient and myself to treat symptomatically and hold off on labs at this time due to recent drawl of same as well as imaging done yesterday.  On my reassessment she is feeling better, she is amenable with plan for additional p.o. Percocet, and discharged to follow-up with all of her outside providers, and to fill her narcotic pain medication tomorrow.    Final Diagnosis       ICD-10-CM    1. Intractable nausea and  vomiting  R11.2       2. Pancreatic pseudocyst/cyst  K86.2     K86.3       3. Generalized abdominal pain  R10.84            Disposition   Discharged    The patient was personally evaluated by myself and discussed with Dr. Henrene Hawking, Janett Billow, MD who agrees with the above assessment and plan.    Brett Canales, PA-C  January 09, 2022    My signature above authenticates this document and my orders, the final    diagnosis (es), discharge prescription (s), and instructions in the Epic    record.  If you have any questions please contact [phone].     Nursing notes have been reviewed by the physician/ advanced practice    Clinician.                             Susanne Borders, PA-C  01/09/22 1541

## 2022-01-09 NOTE — ED Notes (Addendum)
Pt was seen, treated and provided discharge information by provider  Introduced myself to pt.  Pt sitting upright in chair.  Pt A+Ox4  Pt medicated per [facility]    Discharge papers given and reviewed with patient.   Questions answered.   Pt had no questions for the nurse.  Ambulatory out of department.       [name], LPN  33/[phone]

## 2022-01-09 NOTE — Discharge Instructions (Signed)
Follow up with your providers regarding your ED visit.

## 2022-01-09 NOTE — ED Triage Notes (Signed)
Pt arrives ambulatory in triage with c/o of abdominal pain and vomiting. Seen yesterday here for the same.

## 2022-01-14 NOTE — ED Notes (Signed)
Formatting of this note is different from the original.     01/14/22 1922   Lab Draw   Lab Draw Time 1922   Lab Sent Time 1924   Lab Draw Site Right;Antecubital   Lab Draw Device Angiocatheter   Device Gauge 20   # of Lab Draw Attempts 1 Attempt   Labs Drawn with IV Start Yes   Lab Tubes Lavender;Blue;Green;SST       Electronically signed by Ricke Hey at 01/14/2022  7:25 PM EST

## 2022-01-14 NOTE — ED Notes (Signed)
Formatting of this note is different from the original.     01/14/22 1922   PIV: 01/14/22 1922 20 gauge Forearm Anterior;Proximal;Right   Placement Date/Time: 01/14/22 1922   Pre-existing LDA: No  Local Anesthetic: None  Catheter Type: Insyte  Needle Gauge: 20 gauge  Location: Forearm  Orientation: Anterior;Proximal;Right  Patient Tolerance: Insertion: tolerated well   Site Assessment WDL   Line status Saline lock   Dressing Type Transparent / Semipermeable   Dressing Status WDL   Date on Dressing  01/14/22   Securement site guard in place       Electronically signed by Ricke Hey at 01/14/2022  7:25 PM EST

## 2022-01-14 NOTE — ED Triage Notes (Signed)
Formatting of this note might be different from the original.  Pt brought in ambulatory to Triage by [facility] medic 1 from home for c/o abdominal pain, nausea and vomiting x 1 week. Hx: pancreatitis  Accucheck in Triage: 99 mg/dl    Electronically signed by Collene Mares, RN at 01/14/2022  5:36 PM EST

## 2022-01-14 NOTE — ED Provider Notes (Signed)
Formatting of this note is different from the original.    [facility] GENERAL EMERGENCY DEPARTMENT    Time of Arrival:   01/14/22 1721    Final diagnoses:   [K59.00] Constipation, unspecified constipation type (Primary)   [R10.9] Abdominal pain, unspecified abdominal location   [Z87.19] History of pancreatitis     Medical Decision Making: Patient is a 31 year old female with history of pancreatitis, alcohol abuse, presenting today for abdominal pain.    Tachycardic at 104. Otherwise hemodynamically stable, soft abdomen without peritoneal signs.     Order CBC, BMP, LFTs, lipase, UA, CT abdomen pelvis.    Will provide dilaudid at this time and 1L LR.     Differential Diagnosis:   Pancreatic abscess, acute on chronic pancreatitis, stent malfunction, necrotizing pancreatitis, small bowel obstruction, ileus, plus others    Social Determinants of Health:  social factors reviewed, did not limit treatment                   Supplemental Historians include:  patient    ED Course: Brought in by private vehicle    ED Course as of 01/15/22 1113   Sat Jan 14, 2022   1951 CBC WITH DIFFERENTIAL(!)  Mild anemia, no leukocytosis noted [KS]   2008 HEPATIC FUNCTION PANEL  Within normal limits, no transaminitis or hyperbilirubinemia [KS]   2009 Lipase: <20 [KS]   2009 BASIC METABOLIC PANEL(!)  Mild anion gap of 16, otherwise no electrolyte abnormality or AKI noted [KS]   2009 PREGNANCY SERUM: Negative [KS]   2353 Large rectal stool ball with findings suggestive of stercoral colitis.  Large colonic stool burden most pronounced at the transverse colon.  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 [KS]   Sun Jan 15, 2022   0002 Updated patient on current findings.  Patient is incredibly upset due to the fact that she feels like she is still leaving with the same amount of pain.  We discussed at length about the fact that narcotics are not the way to treat this pain and that if  anything it can worsen the constipation that she is currently having.  We discussed taking enema, Colace, docusate.  She states that she has been taking this medications without any relief.  When asked if she had been taking it as written in the chart she says that she has been taking it like she is supposed to.  I expressed to her that we have prescribed her an enema as well as suppositories.  She states that she is not can to be able to do because her bottom feels like it is too tight to be able to put those in.  I asked her if there is any need to do it at this time, discussed that doing an disimpaction at this point would not be beneficial since her stool ball is too high.  She expressed frustration, answered her questions the best my ability asked if there is anything else that we can do at this time she stated that she was just in pain and just felt like she wasted her time. [KS]     CT abdomen shows notable large stool ball burden.  No ileus noted.    Discussed the results of labs and imaging with the patient. Given reassuring workup feel patient is stable for discharge home and does not require hospitalization for further treatment or workup. We discussed plan to follow up with her primary care  physician as well as return precautions to the emergency room for new or worsening symptoms.  Provided patient with their discharge paperwork including follow-up instructions and results from today's visit.  The patient voices understanding of the plan and assures me that should anything go awry she will return to the emergency department without hesitation.    Documentation/Prior Results Review:   Reviewed discharge by Dr. Everrett Coombe on 01/06/22; reviewed recent procedure and pmhx, current medication    Rhythm interpretation from monitor: sinus tachycardia    Imaging Interpreted by me: CT ball stool noted    CT -  ABD/PELVIS-IV ONLY   Final Result     1. Large rectal stool ball with findings suggestive of stercoral  colitis.   2. Large colonic stool burden most pronounced at the transverse colon.   3. 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     A preliminary impression was recorded in PACS at 01/14/2022 11:47 PM by Rodena Medin, MD. The impression above reflects any changes made by the attending physician at the time of report signing.     Dictated ByRodena Medin on 01/14/2022 11:47 PM     As attending radiologist, I have assessed the study images, and dictated or reviewed/edited the final report as needed.     Signed By: Geronimo Running, MD on 01/14/2022 11:50 PM         .     Discussion of Management with other Physicians, QHP or Appropriate Source:   None    .    Disposition:  Home    Discharge Medication List as of 01/14/2022 11:53 PM       START taking these medications    Details   bisacodyL (DULCOLAX, BISACODYL,) 10 mg PR SUPP Insert 1 Suppository rectally Once a Day for 7 days., Disp-7 Suppository, R-0, Normal     docusate sodium (COLACE) 100 mg PO CAPS Take 1 Cap by Mouth Twice Daily for 14 days., Disp-30 Cap, R-0, Normal     polyethylene glycol (MIRALAX) 17 gram PO PwPk Take 1 Packet by Mouth Once a Day for 14 days. 17 GRAMS IN 8 OUNCES OF LIQUID DAILY., Disp-14 Packet, R-0, Normal         Chief Complaint   Patient presents with    ABDOMINAL PAIN    VOMITING     Patient is a 31 year old female with history of pancreatitis, alcohol abuse, had increasing area of walled off necrosis in the last admission, coming in today for abdominal pain.  Had stent placement and cystogastrostomy on 12/20.  She states that over this last week she has been having worsening pain, inability to the stool.  With nausea and vomiting x1.  No chills or fevers.  She states that she has not been able to stool since she was discharged.        Review of Systems:  Constitutional:  Negative for fever, chills and malaise and fatigue.   HENT:  Negative for congestion and trouble  swallowing.    Eyes:  Negative for visual disturbance.   Respiratory:  Negative for cough, shortness of breath, wheezing and stridor.    Cardiovascular:  Negative for chest pain, palpitations and leg swelling.   Gastrointestinal:  Positive for nausea, vomiting, abdominal pain and constipation. Negative for diarrhea and blood in stool.   Genitourinary:  Negative for dysuria and urgency.   Musculoskeletal:  Negative for myalgias and arthralgias.   Skin:  Negative for color change.  Neurological:  Negative for dizziness, weakness, light-headedness and headaches.     Physical Exam  Constitutional:       General: She is not in acute distress.     Appearance: She is normal weight. She is not ill-appearing.      Comments: Uncomfortable appearing   HENT:      Head: Normocephalic and atraumatic.      Right Ear: External ear normal.      Left Ear: External ear normal.      Nose: Nose normal.      Mouth/Throat:      Mouth: Mucous membranes are moist.      Pharynx: Oropharynx is clear. No oropharyngeal exudate or posterior oropharyngeal erythema.   Eyes:      Extraocular Movements: Extraocular movements intact.      Conjunctiva/sclera: Conjunctivae normal.      Pupils: Pupils are equal, round, and reactive to light.   Cardiovascular:      Rate and Rhythm: Normal rate and regular rhythm.      Pulses: Normal pulses.      Heart sounds: Normal heart sounds. No murmur heard.     No friction rub. No gallop.   Pulmonary:      Effort: Pulmonary effort is normal.      Breath sounds: Normal breath sounds. No stridor. No wheezing, rhonchi or rales.   Abdominal:      General: Abdomen is flat. Bowel sounds are normal.      Palpations: Abdomen is soft.      Tenderness: There is abdominal tenderness. There is no guarding.   Musculoskeletal:         General: Normal range of motion.      Cervical back: Normal range of motion.      Right lower leg: No edema.      Left lower leg: No edema.   Skin:     General: Skin is warm and dry.       Capillary Refill: Capillary refill takes less than 2 seconds.   Neurological:      Mental Status: She is alert and oriented to person, place, and time. Mental status is at baseline.     Past Medical History:   Diagnosis Date    Alcohol-induced acute pancreatitis     no longer drinks EtOH regularly    Class 1 obesity with body mass index (BMI) of 32.0 to 32.9 in adult     Gallstones     per pt report     Past Surgical History:   Procedure Laterality Date    EGD WITH ULTRASOUND EXAMINATION N/A 01/04/2022    Procedure: EGD, WITH ENDOSCOPIC ULTRASOUND AXIOS STENT INSERTION;  Surgeon: Henrietta DineParekh, Parth Jateen, MD    LAP CHOLECYSTECTOMY N/A 02/10/2021    Procedure: CHOLECYSTECTOMY, LAPAROSCOPIC, WITH CHOLANGIOGRAM;  Surgeon: Wandra ScotSoult, Alexa P, MD     Family History   Problem Relation Age of Onset    No Known Problems Mother     Other Family History Father         died from trauma     Social History     Occupational History    Not on file   Tobacco Use    Smoking status: Every Day     Types: Cigarettes    Smokeless tobacco: Never   Substance and Sexual Activity    Alcohol use: Not on file    Drug use: Not on file    Sexual activity: Not on file  Outpatient Medications Marked as Taking for the 01/14/22 encounter [facility] Encounter)   Medication Sig Dispense Refill    bisacodyL (DULCOLAX, BISACODYL,) 10 mg PR SUPP Insert 1 Suppository rectally Once a Day for 7 days. 7 Suppository 0    docusate sodium (COLACE) 100 mg PO CAPS Take 1 Cap by Mouth Twice Daily for 14 days. 30 Cap 0    mineral oil (HOG ENEMA) Insert 250 mL rectally once for 1 dose. 250 mL 0    polyethylene glycol (MIRALAX) 17 gram PO PwPk Take 1 Packet by Mouth Once a Day for 14 days. 17 GRAMS IN 8 OUNCES OF LIQUID DAILY. 14 Packet 0    sodium phosphates (FLEET ENEMA) 19-7 gram/118 mL PR ENEM Insert 1 Enema rectally Take As Needed for up to 3 days. 266 mL 0     Allergies   Allergen Reactions    Droperidol neurological reaction     Pt states "face twitching and  couldn't stop legs from moving"     Vital Signs:  Patient Vitals for the past 72 hrs:   Temp Pulse Resp BP BP Mean SpO2 Weight   01/14/22 2103 98.2 F (36.8 C) 67 18 126/90 (!) 105 MM HG 97 % --   01/14/22 1726 96.8 F (36 C) 104 20 127/99 (!) 108 MM HG 93 % 95.3 kg (210 lb)     Diagnostics:  Labs:    Results for orders placed or performed during the hospital encounter of 01/14/22   GLUCOSE SCREEN   Result Value Ref Range    Glucose POC 99 70 - 99 mg/dL   BASIC METABOLIC PANEL   Result Value Ref Range    Potassium 4.2 3.5 - 5.5 mmol/L    Sodium 143 133 - 145 mmol/L    Chloride 106 98 - 110 mmol/L    Glucose 93 70 - 99 mg/dL    Calcium 9.4 8.4 - 10.5 mg/dL    BUN 4 (L) 6 - 22 mg/dL    Creatinine 0.4 (L) 0.5 - 1.2 mg/dL    CO2 21 20 - 32 mmol/L    eGFR >60.0 >60.0 mL/min/1.73 sq.m.    Anion Gap 16.0 (H) 3.0 - 15.0 mmol/L   HEPATIC FUNCTION PANEL   Result Value Ref Range    Albumin 4.5 3.5 - 5.0 g/dL    Total Protein 8.2 6.4 - 8.3 g/dL    Globulin 3.7 2.0 - 4.0 g/dL    A/G Ratio 1.2 1.1 - 2.6 ratio    Bilirubin Total 0.2 0.2 - 1.2 mg/dL    Bilirubin Direct <0.2 0.0 - 0.3 mg/dL    SGOT (AST) 24 10 - 37 U/L    Alkaline Phosphatase 63 25 - 115 U/L    SGPT (ALT) 12 5 - 40 U/L   LIPASE   Result Value Ref Range    Lipase <20 7 - 60 U/L   CBC WITH DIFFERENTIAL AUTO   Result Value Ref Range    WBC 7.5 4.0 - 11.0 K/uL    RBC 4.40 3.80 - 5.20 M/uL    HGB 11.1 (L) 11.7 - 15.5 g/dL    HCT 32.0 (L) 35.1 - 46.5 %    MCV 73 (L) 80 - 99 fL    MCH 25 (L) 26 - 34 pg    MCHC 35 31 - 36 g/dL    RDW 18.5 (H) 10.0 - 15.5 %    Platelet 248 140 - 440 K/uL    MPV 10.2 9.0 -  13.0 fL    Segmented Neutrophils (Auto) 56 40 - 75 %    Lymphocytes (Auto) 35 20 - 45 %    Monocytes (Auto) 8 3 - 12 %    Eosinophils (Auto) 2 0 - 6 %    Basophils (Auto) 0 0 - 2 %    Absolute Neutrophils (Auto) 4.2 1.8 - 7.7 K/uL    Absolute Lymphocytes (Auto) 2.6 1.0 - 4.8 K/uL    Absolute Monocytes (Auto) 0.6 0.1 - 1.0 K/uL    Absolute Eosinophils (Auto) 0.1 0.0 - 0.5  K/uL    Absolute Basophils (Auto) 0.0 0.0 - 0.2 K/uL   PREGNANCY SERUM   Result Value Ref Range    PREGNANCY SERUM Negative Negative     ECG:  No results found for this visit on 01/14/22.    Medications ordered/given in the ED  Medications   Lactated Ringers (LR) infusion (0 mL Intravenous stopped 01/14/22 2031)   HYDROmorphone (Dilaudid) injection 0.5 mg (0.5 mg IV Push Given 01/14/22 1927)   ondansetron (PF) (Zofran) injection 4 mg (4 mg Intravenous Given 01/14/22 1927)   iohexol (OmniPaque) 350 mg iodine/mL solution 100 mL (100 mL Intravenous Given 01/14/22 2112)   HYDROmorphone (Dilaudid) injection 0.5 mg (0.5 mg IV Push Given 01/14/22 2141)     Johnette Abraham, MD PGY-2  [facility] Emergency Medicine    Electronically signed by Nydia Bouton, MD at 01/16/2022 12:47 AM EST

## 2022-01-15 NOTE — ED Triage Notes (Signed)
Formatting of this note might be different from the original.  C/o severe abdominal pain, pain 10/10.  Was discharged at waiting on cab.     Electronically signed by Pierce Crane, RN at 01/15/2022  2:50 AM EST

## 2022-01-15 NOTE — ED Provider Notes (Signed)
Formatting of this note is different from the original.    [facility] GENERAL EMERGENCY DEPARTMENT    Time of Arrival:   01/15/22 1497    Final diagnoses:   [R10.13] Epigastric pain (Primary)     Medical Decision Making:      Differential Diagnosis: Pancreatitis, bowel obstruction, malingering, drug-seeking, gastritis, gastroenteritis    Social Determinants of Health:  social factors reviewed, did not limit treatment                               Supplemental Historians include:  patient    ED Course: Patient's history and physical exam are reassuring.  Patient's vitals are stable she does not appear in any acute distress.    Patient does appear uncomfortable however patient was just discharged less than an hour ago from the emergency department and had negative workup including CT abdomen pelvis with contrast with laboratory work withoutSignificant abnormality.    There is some concern for malingering or drug-seeking will give 1 dose of Dilaudid 0.5 mg as well as some Bentyl in the ED and patient will be discharged to follow-up with gastroenterology she says she has an appointment next week.Do not believe we need to repeat any imaging or laboratory work as patient had these performed less than 2 hours ago.        Documentation/Prior Results Review:  Nursing notes    Rhythm interpretation from monitor: N/A    Imaging Interpreted by me: Not Applicable    No orders to display     .     Discussion of Management with other Physicians, QHP or Appropriate Source:   None    .    Disposition:  Home    New Prescriptions    No medications on file     Chief Complaint   Patient presents with    ABDOMINAL PAIN     Rhonda Hammond is a 31 y.o. female here complaining of abdominal pain in the epigastric region that has been going on for about a day.  Reports she has a history of abdominal pain and pancreatitis.  Patient states that she was just seen in the ED and that she just left was in the waiting room waiting for her  ride on the abdominal pain came back so she checked back in due to worsening pain.  Patient states she just had a cyst removed and has been having issues with that since revision where she has a GI appointment next week.    ABDOMINAL PAIN  Associated symptoms: no chills, no fever, no nausea and no vomiting        Review of Systems:  Constitutional:  Negative for fever and chills.   Gastrointestinal:  Positive for abdominal pain. Negative for nausea and vomiting.   Psychiatric/Behavioral:  Negative for agitation.      Physical Exam  Vitals reviewed.   Constitutional:       General: She is not in acute distress.     Appearance: Normal appearance. She is not ill-appearing, toxic-appearing or diaphoretic.   HENT:      Head: Normocephalic and atraumatic.      Nose: Nose normal. No congestion or rhinorrhea.   Eyes:      Extraocular Movements: Extraocular movements intact.   Cardiovascular:      Rate and Rhythm: Normal rate.   Pulmonary:      Effort: Pulmonary effort is normal. No respiratory distress.  Abdominal:      General: There is no distension.      Palpations: Abdomen is soft.      Tenderness: There is abdominal tenderness. There is no guarding or rebound.      Comments: Patient has some tenderness in the epigastric and left upper quadrant region.   Musculoskeletal:      Cervical back: Normal range of motion.   Skin:     General: Skin is warm and dry.   Neurological:      Mental Status: She is alert.      Gait: Gait normal.   Psychiatric:         Mood and Affect: Mood normal.         Thought Content: Thought content normal.         Judgment: Judgment normal.     Past Medical History:   Diagnosis Date    Alcohol-induced acute pancreatitis     no longer drinks EtOH regularly    Class 1 obesity with body mass index (BMI) of 32.0 to 32.9 in adult     Gallstones     per pt report     Past Surgical History:   Procedure Laterality Date    EGD WITH ULTRASOUND EXAMINATION N/A 01/04/2022    Procedure: EGD, WITH ENDOSCOPIC  ULTRASOUND AXIOS STENT INSERTION;  Surgeon: Golda Acre, MD    LAP CHOLECYSTECTOMY N/A 02/10/2021    Procedure: CHOLECYSTECTOMY, LAPAROSCOPIC, WITH CHOLANGIOGRAM;  Surgeon: Wynelle Fanny, MD     Family History   Problem Relation Age of Onset    No Known Problems Mother     Other Family History Father         died from trauma     Social History     Occupational History    Not on file   Tobacco Use    Smoking status: Every Day     Types: Cigarettes    Smokeless tobacco: Never   Substance and Sexual Activity    Alcohol use: Not on file    Drug use: Not on file    Sexual activity: Not on file     No outpatient medications have been marked as taking for the 01/15/22 encounter [facility] Encounter).     Allergies   Allergen Reactions    Droperidol neurological reaction     Pt states "face twitching and couldn't stop legs from moving"     Vital Signs:  Patient Vitals for the past 72 hrs:   Temp Heart Rate Resp BP BP Mean SpO2 Weight   01/15/22 0242 97 F (36.1 C) 92 20 128/64 85 MM HG 98 % 93.6 kg (206 lb 4.8 oz)     Diagnostics:  Labs:  No results found for this visit on 01/15/22.  ECG:  No results found for this visit on 01/15/22.    Medications ordered/given in the ED  Medications   HYDROmorphone (Dilaudid) injection 0.5 mg (0.5 mg Intramuscular Given 01/15/22 0405)   dicyclomine (BentyL) capsule 10 mg (10 mg Oral Given 01/15/22 0405)       Electronically signed by Tiana Loft, MD at 01/16/2022  4:26 AM EST

## 2022-01-15 NOTE — ED Notes (Signed)
Formatting of this note might be different from the original.  Pain assessment on discharge was tolerable.  Condition stable.  Patient discharged to home.  Patient education was completed:  yes  Education taught to:  patient  Teaching method used was discussion, demonstration and handout.  Patient's understanding of teaching was good.  Patient was discharged ambulatory.  Discharged with self.  Valuables were given to: patient.    Electronically signed by Shyrl Numbers, RN at 01/15/2022  4:16 AM EST

## 2022-01-15 NOTE — ED Notes (Signed)
Formatting of this note might be different from the original.  Pt refuses to have vital signs rechecked, states " this was a waste of my time " after the MD explained everything to her, she then walked out of the triage room   Electronically signed by Denzil Magnuson, RN at 01/15/2022 12:03 AM EST

## 2022-01-16 NOTE — ED Notes (Signed)
Formatting of this note might be different from the original.  Pt medicated for pain that is a 10 on scale 0-10  (See mar)  Repeat VS obtained and documented  MD aware pt concerned about getting these medications po stating they are not going to work    Electronically signed by Isabelle Course, RN at 01/16/2022  8:50 PM EST

## 2022-01-16 NOTE — ED Notes (Signed)
Formatting of this note might be different from the original.  Pt vomited after po medication  MD aware   Additional medication for nausea/vomiting administered   Awaiting additional orders to include something for pain as discussed with MD  Pt tearful at this time  Electronically signed by Isabelle Course, RN at 01/16/2022  8:57 PM EST

## 2022-01-16 NOTE — ED Triage Notes (Signed)
Formatting of this note might be different from the original.  Patient complains of upper abdominal pain with NV since yesterday.   Electronically signed by Dellis Filbert, RN at 01/16/2022  6:10 PM EST

## 2022-01-16 NOTE — ED Notes (Signed)
Formatting of this note might be different from the original.  Assumed care of pt in vertical  Pt moved from ortho room to [place] has seen MD, awaiting orders     Electronically signed by Isabelle Course, RN at 01/16/2022  7:59 PM EST

## 2022-01-16 NOTE — ED Provider Notes (Signed)
Formatting of this note is different from the original.  ED Resident Supervision Note    I have seen and evaluated Rhonda Hammond and agree with the provider?s findings (exceptions, if any, noted below).  I reviewed and directed the Emergency Department treatment given to Rhonda Hammond     A/P:    I have discussed and directed the care care of Rhonda Hammond  32 y.o. female  Noted hx   Recent reassuring ct   Well appearing and not in distress   Sx management and follow up     Critical Care Time:      Procedures:      S:  32 y.o. female  with a chief complaint of ABDOMINAL PAIN    O:  Patient Vitals for the past 72 hrs:   Temp Heart Rate Resp BP BP Mean SpO2 Weight   01/16/22 1810 97.2 F (36.2 C) 101 18 137/87 (!) 104 MM HG 100 % 91.6 kg (201 lb 14.4 oz)       Forest Becker, MD       Electronically signed by Rhea Pink, MD at 01/31/2022  7:21 AM EST

## 2022-01-16 NOTE — ED Notes (Signed)
Formatting of this note is different from the original.     01/16/22 2000   Lab Draw   Lab Draw Time 2004   Lab Sent Time 2006   Lab Draw Site Left;Antecubital   Lab Draw Device Angiocatheter   Device Gauge 20   Labs Drawn with IV Start Yes   Lab Tubes Rainbow       Electronically signed by Lorre Nick, RN at 01/16/2022  8:06 PM EST

## 2022-01-16 NOTE — ED Notes (Signed)
Formatting of this note might be different from the original.  Pt moved to ortho room  Undressed and medicated for pain that is a 10 on scale 0-10 (see mar)  Verified allergies prior to admin  Awaiting rectal exam by md then plan for hog enema   Pt aware of plan    Electronically signed by Isabelle Course, RN at 01/16/2022 11:54 PM EST

## 2022-01-16 NOTE — ED Notes (Signed)
Formatting of this note might be different from the original.  Pt attempted to provide urine specimen unable  States did have bowel movement    Electronically signed by Isabelle Course, RN at 01/16/2022  8:27 PM EST

## 2022-01-16 NOTE — ED Notes (Signed)
Formatting of this note might be different from the original.  Requested hog enema from pharmacy  Awaiting area for MD to complete rectal check    Electronically signed by Isabelle Course, RN at 01/16/2022 10:49 PM EST

## 2022-01-16 NOTE — ED Notes (Signed)
Formatting of this note might be different from the original.  Repeat VS obtained and documented  Pt c/o pain that is a 10 on scale 0-10   Tearful  Aware awaiting area to admin hog enema and for md to complete rectal check  Messaged MD regarding c/o pain 10 on scale 0-10   Electronically signed by Isabelle Course, RN at 01/16/2022 11:14 PM EST

## 2022-01-16 NOTE — ED Notes (Signed)
Formatting of this note might be different from the original.  Pt states she has pain in her upper abdomen. Pt states the pain is 7/10. MD Ronnald Ramp Notified. Will continue to monitor.   Electronically signed by Adalberto Cole, RN at 01/16/2022 10:28 PM EST

## 2022-01-17 NOTE — ED Provider Notes (Signed)
Formatting of this note is different from the original.    [place]    Time of Arrival:   01/16/22 1807    Final diagnoses:   [K59.00] Constipation, unspecified constipation type (Primary)     Medical Decision Making:      Differential Diagnosis:   Constipation, obstruction, perforation, pancreatitis, cholecystitis, PUD    32 year old female with history of chronic abdominal pain, pancreatitis, alcohol use presenting for abdominal pain.  This is patient's third ED visit in the last 3 days for similar.     Patient was seen on 01/14/2022; workup reassuring at that time with no evidence of pancreatitis.  She had a CT abdomen pelvis at that time which showed large rectal stool ball and large colonic stool burden.  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  She was discharged home with laxatives.  Returned hours later.  Was given an additional dose of pain medication and sent home.    Given recent imaging, will defer imaging.  Will screen labs, attempt disimpaction and enema here for relief.    Social Determinants of Health:   Alcohol use                              Supplemental Historians include:  patient and medical records    ED Course:     Patient vomited Bentyl.  Given Zofran.  KUB ordered to rule out perforation.  Doubt obstruction given CT scan on 12/30.    I was notified by nursing that patient was able to have a bowel movement.  Patient describes bowel movement as some liquid and some hard pebbles.    Abdominal x-ray normal  CBC unremarkable  BMP unremarkable  LFTs normal  Lipase normal    Attempted disimpaction.  On rectal exam, patient had empty vault.  Given 4 mg morphine for rectal exam.    Patient administered hog enema.    I had a long discussion with patient regarding her results.  At this time I will not be prescribing patient any additional narcotic pain medication as I believe this is significantly contributing to her  abdominal pain and severe constipation.  Patient agreeable to bowel cleanout.  Given instructions.      Patient discharged home after enema.  Will call in Bentyl to pharmacy.        Documentation/Prior Results Review:  Old medical records    Rhythm interpretation from monitor: N/A    Imaging Interpreted by me:  Abdominal x-ray without evidence of perforation    ABDOMEN/KUB   Final Result     Nonobstructive bowel gas pattern. Moderate colorectal stool burden.     Dictated ByMaricela Bo on 01/16/2022 9:55 PM     As attending radiologist, I have assessed the study images, and dictated or reviewed/edited the final report as needed.     Signed By: Linna Caprice, MD on 01/16/2022 10:01 PM         .     Discussion of Management with other Physicians, QHP or Appropriate Source:   None    .    Disposition:  Home    New Prescriptions    No medications on file     Chief Complaint   Patient presents with    ABDOMINAL PAIN     HPI     Patient is a 32 year old female with history of pancreatitis and chronic alcohol use  who presents today with abdominal pain.  She has had several ED visits for similar.  She states pain is located in the upper part of her abdomen.  She still has not had a bowel movement since last ED visit yesterday.  She states she has not had any alcohol last 24 hours.      Review of Systems    Physical Exam  Vitals and nursing note reviewed. Exam conducted with a chaperone present.   Constitutional:       Comments: Appears to be in pain, standing on side of bed   HENT:      Head: Normocephalic and atraumatic.      Right Ear: External ear normal.      Left Ear: External ear normal.      Nose: Nose normal.   Eyes:      Extraocular Movements: Extraocular movements intact.      Conjunctiva/sclera: Conjunctivae normal.   Cardiovascular:      Rate and Rhythm: Normal rate and regular rhythm.      Heart sounds: No murmur heard.  Pulmonary:      Effort: Pulmonary effort is normal. No respiratory distress.       Breath sounds: Normal breath sounds. No wheezing.   Abdominal:      General: Abdomen is flat.      Palpations: Abdomen is soft.      Tenderness: There is abdominal tenderness (Diffuse tenderness).   Musculoskeletal:         General: No deformity or signs of injury.      Cervical back: Normal range of motion and neck supple.   Skin:     General: Skin is warm and dry.   Neurological:      General: No focal deficit present.      Mental Status: She is alert and oriented to person, place, and time.   Psychiatric:         Mood and Affect: Mood normal.         Behavior: Behavior normal.     Past Medical History:   Diagnosis Date    Alcohol-induced acute pancreatitis     no longer drinks EtOH regularly    Class 1 obesity with body mass index (BMI) of 32.0 to 32.9 in adult     Gallstones     per pt report     Past Surgical History:   Procedure Laterality Date    EGD WITH ULTRASOUND EXAMINATION N/A 01/04/2022    Procedure: EGD, WITH ENDOSCOPIC ULTRASOUND AXIOS STENT INSERTION;  Surgeon: Golda Acre, MD    LAP CHOLECYSTECTOMY N/A 02/10/2021    Procedure: CHOLECYSTECTOMY, LAPAROSCOPIC, WITH CHOLANGIOGRAM;  Surgeon: Wynelle Fanny, MD     Family History   Problem Relation Age of Onset    No Known Problems Mother     Other Family History Father         died from trauma     Social History     Occupational History    Not on file   Tobacco Use    Smoking status: Every Day     Types: Cigarettes    Smokeless tobacco: Never   Substance and Sexual Activity    Alcohol use: Not on file    Drug use: Not on file    Sexual activity: Not on file     No outpatient medications have been marked as taking for the 01/16/22 encounter [facility] Encounter).     Allergies   Allergen Reactions  Droperidol neurological reaction     Pt states "face twitching and couldn't stop legs from moving"     Vital Signs:  Patient Vitals for the past 72 hrs:   Temp Heart Rate Resp BP BP Mean SpO2 Weight   01/16/22 2310 98.6 F (37 C) 91 16 156/94 (!) 115 MM  HG 97 % --   01/16/22 2045 97.8 F (36.6 C) 96 16 152/97 (!) 115 MM HG 96 % --   01/16/22 1810 97.2 F (36.2 C) 101 18 137/87 (!) 104 MM HG 100 % 91.6 kg (201 lb 14.4 oz)     Diagnostics:  Labs:    Results for orders placed or performed during the hospital encounter of 01/16/22   CBC WITH DIFFERENTIAL AUTO   Result Value Ref Range    WBC 6.6 4.0 - 11.0 K/uL    RBC 4.87 3.80 - 5.20 M/uL    HGB 12.1 11.7 - 15.5 g/dL    HCT 40.3 47.4 - 25.9 %    MCV 74 (L) 80 - 99 fL    MCH 25 (L) 26 - 34 pg    MCHC 34 31 - 36 g/dL    RDW 56.3 (H) 87.5 - 15.5 %    Platelet 239 140 - 440 K/uL    MPV 11.3 9.0 - 13.0 fL    Segmented Neutrophils (Auto) 59 40 - 75 %    Lymphocytes (Auto) 33 20 - 45 %    Monocytes (Auto) 7 3 - 12 %    Eosinophils (Auto) 1 0 - 6 %    Basophils (Auto) 0 0 - 2 %    Absolute Neutrophils (Auto) 3.9 1.8 - 7.7 K/uL    Absolute Lymphocytes (Auto) 2.2 1.0 - 4.8 K/uL    Absolute Monocytes (Auto) 0.5 0.1 - 1.0 K/uL    Absolute Eosinophils (Auto) 0.1 0.0 - 0.5 K/uL    Absolute Basophils (Auto) 0.0 0.0 - 0.2 K/uL     ECG:  No results found for this visit on 01/16/22.    Medications ordered/given in the ED  Medications   dicyclomine (BentyL) capsule 10 mg (has no administration in time range)   dicyclomine (BentyL) capsule 20 mg (20 mg Oral Given 01/16/22 2043)   famotidine (Pepcid) tablet 20 mg (20 mg Oral Given 01/16/22 2043)   ondansetron (PF) (Zofran) injection 4 mg (4 mg IV Push Given 01/16/22 2055)   HOG enema 500 mL (500 mL Rectal Given 01/17/22 0006)   morphine injection 4 mg (4 mg Intravenous Given 01/16/22 2350)       Electronically signed by Theora Gianotti, MD at 01/31/2022  7:21 AM EST

## 2022-01-17 NOTE — ED Notes (Signed)
Formatting of this note might be different from the original.  Enema given as ordered    Electronically signed by Isabelle Course, RN at 01/17/2022 12:17 AM EST

## 2022-01-17 NOTE — ED Notes (Signed)
Formatting of this note might be different from the original.  Medicated as ordered )see mar)  PIV removed IV cath intact   Dc instructions given  Pain assessment on discharge was 5.  Condition stable.  Patient discharged to home.  Patient education was completed:  yes  Education taught to:  patient  Teaching method used was discussion and handout.  Understanding of teaching was good.  Patient was discharged ambulatory.  Discharged with self.  Valuables were given to: patient.   Electronically signed by Isabelle Course, RN at 01/17/2022  2:00 AM EST

## 2022-01-20 ENCOUNTER — Inpatient Hospital Stay
Admit: 2022-01-20 | Discharge: 2022-01-20 | Disposition: A | Discharge status: Home / Self Care | Payer: PRIVATE HEALTH INSURANCE | Attending: Emergency Medicine

## 2022-01-20 DIAGNOSIS — R109 Unspecified abdominal pain: Secondary | ICD-10-CM

## 2022-01-20 DIAGNOSIS — G8929 Other chronic pain: Secondary | ICD-10-CM

## 2022-01-20 LAB — CBC WITH AUTO DIFFERENTIAL
Basophils: 0.1 % (ref 0–3)
Eosinophils: 1.9 % (ref 0–5)
Hematocrit: 36.6 % (ref 35.0–47.0)
Hemoglobin: 12.2 gm/dl (ref 11.0–16.0)
Immature Granulocytes: 0.3 % (ref 0.0–3.0)
Lymphocytes: 39.6 % (ref 28–48)
MCH: 24.2 pg — ABNORMAL LOW (ref 25.4–34.6)
MCHC: 33.3 gm/dl (ref 30.0–36.0)
MCV: 72.5 fL — ABNORMAL LOW (ref 80.0–98.0)
MPV: 10.7 fL — ABNORMAL HIGH (ref 6.0–10.0)
Monocytes: 7.1 % (ref 1–13)
Neutrophils Segmented: 51 % (ref 34–64)
Nucleated RBCs: 0 (ref 0–0)
Platelets: 272 10*3/uL (ref 140–450)
RBC: 5.05 M/uL (ref 3.60–5.20)
RDW: 46.9 — ABNORMAL HIGH (ref 36.4–46.3)
WBC: 6.7 10*3/uL (ref 4.0–11.0)

## 2022-01-20 LAB — COMPREHENSIVE METABOLIC PANEL
ALT: 8 U/L — ABNORMAL LOW (ref 10–49)
AST: 19 U/L (ref 0.0–33.9)
Albumin: 4.6 gm/dl (ref 3.4–5.0)
Alkaline Phosphatase: 58 U/L (ref 46–116)
Anion Gap: 13 mmol/L (ref 5–15)
BUN: 6 mg/dl — ABNORMAL LOW (ref 9–23)
CO2: 21 mEq/L (ref 20–31)
Calcium: 9.8 mg/dl (ref 8.7–10.4)
Chloride: 103 mEq/L (ref 98–107)
Creatinine: 0.6 mg/dl (ref 0.55–1.02)
GFR African American: >60
GFR Non-African American: >60
Glucose: 117 mg/dl — ABNORMAL HIGH (ref 74–106)
Potassium: 3.6 mEq/L (ref 3.5–5.1)
Sodium: 137 mEq/L (ref 136–145)
Total Bilirubin: 0.4 mg/dl (ref 0.30–1.20)
Total Protein: 8.4 gm/dl — ABNORMAL HIGH (ref 5.7–8.2)

## 2022-01-20 LAB — LIPASE: Lipase: 33 U/L (ref 12–53)

## 2022-01-20 MED ORDER — PROMETHAZINE (PHENERGAN) 25 MG 50 ML IVPB
INTRAVENOUS | Status: DC
Start: 2022-01-20 — End: 2022-01-20

## 2022-01-20 MED ORDER — GLYCOPYRROLATE 0.2 MG/ML IJ SOLN
0.2 MG/ML | INTRAMUSCULAR | Status: AC
Start: 2022-01-20 — End: 2022-01-20
  Administered 2022-01-20: 12:00:00 0.2 mg via INTRAVENOUS

## 2022-01-20 MED ORDER — LORAZEPAM 2 MG/ML IJ SOLN
2 MG/ML | Freq: Once | INTRAMUSCULAR | Status: DC
Start: 2022-01-20 — End: 2022-01-20

## 2022-01-20 MED ORDER — FAMOTIDINE (PF) 20 MG/2ML IV SOLN
202 MG/2ML | INTRAVENOUS | Status: AC
Start: 2022-01-20 — End: 2022-01-20
  Administered 2022-01-20: 12:00:00 20 mg via INTRAVENOUS

## 2022-01-20 MED ORDER — KETOROLAC TROMETHAMINE 15 MG/ML IJ SOLN
15 MG/ML | Freq: Once | INTRAMUSCULAR | Status: DC
Start: 2022-01-20 — End: 2022-01-20

## 2022-01-20 MED ORDER — ONDANSETRON 4 MG PO TBDP
4 MG | ORAL_TABLET | Freq: Three times a day (TID) | ORAL | 0 refills | Status: AC | PRN
Start: 2022-01-20 — End: 2022-02-01

## 2022-01-20 MED ORDER — FAMOTIDINE 20 MG PO TABS
20 MG | ORAL_TABLET | Freq: Two times a day (BID) | ORAL | 0 refills | Status: AC
Start: 2022-01-20 — End: 2022-01-30

## 2022-01-20 MED ORDER — SUCRALFATE 1 G PO TABS
1 GM | ORAL_TABLET | Freq: Four times a day (QID) | ORAL | 0 refills | Status: DC
Start: 2022-01-20 — End: 2022-04-15

## 2022-01-20 MED ORDER — ONDANSETRON HCL 4 MG/2ML IJ SOLN
4 MG/2ML | Freq: Once | INTRAMUSCULAR | Status: AC
Start: 2022-01-20 — End: 2022-01-20
  Administered 2022-01-20: 12:00:00 4 mg via INTRAVENOUS

## 2022-01-20 MED ORDER — ACETAMINOPHEN 10 MG/ML IV SOLN
10 MG/ML | Freq: Once | INTRAVENOUS | Status: AC
Start: 2022-01-20 — End: 2022-01-20
  Administered 2022-01-20: 12:00:00 1000 mg via INTRAVENOUS

## 2022-01-20 MED ORDER — SODIUM CHLORIDE 0.9 % IV BOLUS
0.9 % | Freq: Once | INTRAVENOUS | Status: AC
Start: 2022-01-20 — End: 2022-01-20
  Administered 2022-01-20: 12:00:00 1000 mL via INTRAVENOUS

## 2022-01-20 MED FILL — FAMOTIDINE (PF) 20 MG/2ML IV SOLN: 20 MG/2ML | INTRAVENOUS | Qty: 2

## 2022-01-20 MED FILL — ACETAMINOPHEN 10 MG/ML IV SOLN: 10 MG/ML | INTRAVENOUS | Qty: 100

## 2022-01-20 MED FILL — GLYCOPYRROLATE 0.2 MG/ML IJ SOLN: 0.2 MG/ML | INTRAMUSCULAR | Qty: 1

## 2022-01-20 MED FILL — ONDANSETRON HCL 4 MG/2ML IJ SOLN: 4 MG/2ML | INTRAMUSCULAR | Qty: 2

## 2022-01-20 NOTE — Discharge Instructions (Signed)
Follow up with your gastroenterologist. Let them know you were seen in the ER and need close follow up  Take medications as prescribed  Clear fluids and bland diet then slowly advance as tolerated  Avoid spicy/acidic foods  Return to the ER if condition worsens or new symptoms develop.

## 2022-01-20 NOTE — ED Provider Notes (Signed)
[facility]  Emergency Department Treatment Report    Patient: Rhonda Hammond Age: 32 y.o. Sex: female    Date of Birth: [birth year] Admit Date: 01/20/2022 PCP: Danny Lawless, MD   MRN: [id number]  CSN: [id number]     Room: T02/T02 Time Dictated: 8:08 AM      Attending Physician: Smitty Cords, MD   Physician Assistant: Asencion Islam, PA-C    Chief Complaint   Chief Complaint   Patient presents with    Abdominal Pain       History of Present Illness   32 y.o. female with history of alcohol induced chronic pancreatitis, gastritis, cocaine and alcohol abuse who presents to the ED via medic from home complaining of epigastric abdominal pain associated with nausea and vomiting.  She states it feels similar to prior episodes of pancreatitis although she does not know the cause since she has not had any alcohol recently.  She has had 3 episodes of nonbloody emesis.  Denies fevers, chills, diarrhea, dysuria.  She had a normal bowel movement today.    She states she has not had alcohol since she had a cyst removed and she was discharged from the hospital on 01/07/2022.    She smokes marijuana regularly.    Denies risk of pregnancy    INDEPENDENT HISTORIAN:  History and/or plan development assisted by: EMS    Review of Systems   Constitutional: No fever or chills  Eyes: No visual symptoms  ENT: No sore throat, runny nose, or other URI symptoms  Respiratory: No cough or shortness or breath  Cardiovascular: No chest pain  Gastrointestinal: As noted above  Genitourinary: No dysuria or hematuria  Musculoskeletal: No swelling  Integumentary: No rashes  Neurological: No headaches  Psychiatric: No HI or SI  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     Intractable nausea and vomiting 06/26/2020    Obesity 12/16/2018    Pancreatitis       Past Surgical History:   Procedure Laterality Date    CESAREAN SECTION      CHOLECYSTECTOMY      UPPER GASTROINTESTINAL ENDOSCOPY N/A 10/12/2021    EGD DIAGNOSTIC performed by Elberta Spaniel, MD at [facility] ENDOSCOPY       Social History     Social History     Socioeconomic History    Marital status: Single   Tobacco Use    Smoking status: Every Day     Types: Cigars   Substance and Sexual Activity    Alcohol use: Yes     Alcohol/week: 21.0 standard drinks of alcohol     Types: 21 Shots of liquor per week     Comment: 2-3 drinks a day, sometimes more    Drug use: Yes     Types: Marijuana Sheran Fava)     Social Determinants of Health     Food Insecurity: No Food Insecurity (12/16/2021)    Hunger Vital Sign     Worried About Running Out of Food in the Last Year: Never true     Ran Out of Food in the Last Year: Never true   Transportation Needs: No Transportation Needs (12/16/2021)    PRAPARE - Therapist, art (Medical): No  Lack of Transportation (Non-Medical): No   Housing Stability: Low Risk  (12/16/2021)    Housing Stability Vital Sign     Unable to Pay for Housing in the Last Year: No     Number of Places Lived in the Last Year: 1     Unstable Housing in the Last Year: No       Family History     Family History   Problem Relation Age of Onset    No Known Problems Mother        Home Medications     Discharge Medication List as of 01/20/2022  7:11 AM        CONTINUE these medications which have NOT CHANGED    Details   pantoprazole (PROTONIX) 40 MG tablet Take 1 tablet by mouth 2 times daily (before meals), Disp-60 tablet, R-1Normal      albuterol sulfate HFA (PROVENTIL;VENTOLIN;PROAIR) 108 (90 Base) MCG/ACT inhaler Inhale 2 puffs into the lungs every 6 hours as needed for Wheezing, Disp-18 g, R-3Normal      albuterol (ACCUNEB) 0.63 MG/3ML nebulizer solution Inhale 0.63 mg into the lungs every 6 hours as neededHistorical Med              Allergies     Allergies   Allergen Reactions    Droperidol  Other (See Comments)     Face twitching, drooling and spontaneous leg movement       Physical Exam   BP 132/79   Pulse 93   Temp 98 F (36.7 C) (Oral)   Resp 18   Ht 1.651 m (5\' 5" )   Wt 93.9 kg (207 lb)   SpO2 98%   BMI 34.45 kg/m     General appearance: Well developed, well nourished female sitting in the exam chair  Eyes: Conjunctivae clear, non-icteric  ENT: Ears/Nose: Hearing is grossly intact to voice. External examinations of the ears and nose are unremarkable.  Respiratory: Clear to auscultation bilaterally  Cardiovascular: Regular, rate and rhythm  GI: Obese, soft, nondistended, tender palpation epigastrium.  No rebound or guarding.  Musculoskeletal: Patient able to move all four extremities. No peripheral edema  Skin: Warm and dry without rashes  Neurologic: Alert, oriented. Answers questions appropriately    Impression and Management Plan   This is a 32 y.o. female with history of alcohol induced chronic pancreatitis, gastritis, cocaine and alcohol abuse who presents to the ED complaining of epigastric abdominal pain associate with nausea and vomiting.  Labs obtained prior to my evaluation.  Will treat symptomatically.    Differential diagnoses: Acute on chronic pancreatitis, gastritis, gastroenteritis    Diagnostic Studies   Lab:   Labs Reviewed   CBC WITH AUTO DIFFERENTIAL - Abnormal; Notable for the following components:       Result Value    MCV 72.5 (*)     MCH 24.2 (*)     MPV 10.7 (*)     RDW 46.9 (*)     All other components within normal limits   COMPREHENSIVE METABOLIC PANEL - Abnormal; Notable for the following components:    Glucose 117 (*)     BUN 6 (*)     ALT 8 (*)     Total Protein 8.4 (*)     All other components within normal limits   LIPASE   POC PREGNANCY UR-QUAL         ED Course/MDM     [facility] RECORDS REVIEWED: Patient had labs drawn on 01/08/2022.  BUN less  than 5, creatinine 0.46.  LFTs and lipase within normal limits.    Patient is CT scan on 01/08/2022    IMPRESSION:     1.  Cholecystectomy and pancreatic stent placement.  2. 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 Residual pancreatic ductal dilatation stable.  Decreased acute pancreatitis.     3. Constipation.    EXTERNAL RESULTS REVIEWED: I reviewed the patient's chart via Care Everywhere.  Patient is CT scan on 01/14/2022    CT CHEST ABDOMEN PELVIS W CONTRAST  Order: [id number]  Impression      1. Large rectal stool ball with findings suggestive of stercoral colitis.   2. Large colonic stool burden most pronounced at the transverse colon.   3. 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     Severe exacerbation or progression of chronic illness: abdominal pain    Threat to body function without evaluation and management:  GI     SOCIAL DETERMINANTS  impacting Evaluation and Management: Transportation    Substance abuse    Lack of support        Comorbidities impacting Evaluation and Management: alcohol induced chronic pancreatitis, gastritis, cocaine and alcohol abuse    NARRATIVE:    ED Course as of 01/20/22 0808   Fri Jan 20, 2022   0617 WBC: 6.7 [AA]   0617 Hemoglobin Quant: 12.2 [AA]   0617 Hematocrit: 36.6 [AA]   0617 Glucose, Random(!): 117 [AA]   0617 BUN,BUNPL(!): 6 [AA]   0617 Creatinine: 0.60 [AA]   0617 Lipase: 33 [AA]      ED Course User Index  [AA] Sundra Haddix R, PA     Patient stable in the ER.  Discussed results with the patient.  CBC with no leukocytosis or anemia.  CMP and lipase unremarkable.  This is a recurrent issue for the patient.  She has had multiple ER visits for the same.  Patient received symptomatic treatment.  Patient still complaining of pain.  I ordered her additional medications but states that no one is doing anything for her due to this recurrent pain.  She states she is going to ERs and leaving feeling the same way.  Patient would like her IV removed.  She states she will call a  cab.  Instructed patient to follow-up with her gastroenterologist.  Take medications as prescribed.  Clear fluids and bland diet. Return to the ER if condition worsens or new symptoms develop.  Patient agreed to the plan and all of her questions were answered      Medications   sodium chloride 0.9 % bolus 1,000 mL (0 mLs IntraVENous Stopped 01/20/22 0723)   acetaminophen (OFIRMEV) infusion 1,000 mg (0 mg IntraVENous Stopped 01/20/22 0707)   ondansetron (ZOFRAN) injection 4 mg (4 mg IntraVENous Given 01/20/22 0636)   famotidine (PEPCID) 20 mg in sodium chloride (PF) 0.9 % 10 mL injection (20 mg IntraVENous Given 01/20/22 0636)   glycopyrrolate (ROBINUL) injection 0.2 mg (0.2 mg IntraVENous Given 01/20/22 0636)       Final Diagnosis       ICD-10-CM    1. Chronic abdominal pain  R10.9     G89.29       2. Nausea and vomiting, unspecified vomiting type  R11.2       3. Dehydration  E86.0         Disposition   Patient discharged home in stable condition with the  instructions stated above. Prescription for   Discharge Medication List as of 01/20/2022  7:11 AM        START taking these medications    Details   ondansetron (ZOFRAN-ODT) 4 MG disintegrating tablet Take 1 tablet by mouth 3 times daily as needed for Nausea or Vomiting, Disp-21 tablet, R-0Normal      famotidine (PEPCID) 20 MG tablet Take 1 tablet by mouth 2 times daily for 10 days, Disp-20 tablet, R-0Normal      sucralfate (CARAFATE) 1 GM tablet Take 1 tablet by mouth 4 times daily for 10 days, Disp-40 tablet, R-0Normal          given.        The patient was personally evaluated by myself and discussed with Alisa Graff Pete Pelt, MD  who agrees with the above assessment and plan      Lucio Edward, PA-C  January 20, 2022    [facility] medical dictation software was used for portions of this report. Unintended errors may occur.     My signature above authenticates this document and my orders, the final    diagnosis (es), discharge prescription (s), and instructions in the Epic     record.  If you have any questions please contact [phone].     Nursing notes have been reviewed by the physician/ advanced practice    Clinician.     Shelly Coss, PA  01/20/22 [phone]

## 2022-01-20 NOTE — ED Notes (Signed)
Pt discharged by PA Annalyn.      Rayetta Pigg, RN  01/20/22 [phone]

## 2022-01-20 NOTE — ED Triage Notes (Signed)
Pt arrives via ems     Ambulatory  Hx of pancreatitis   Pt having abd pain

## 2022-01-20 NOTE — ED Notes (Signed)
Patient medicated per MAR.     Patient pacing in triage.     Requesting to see the provider again      Milagros Reap, RN  01/20/22 [phone]

## 2022-01-21 NOTE — ED Provider Notes (Signed)
Formatting of this note is different from the original.  ED Resident Supervision Note    I have seen and evaluated Uvaldo Rising and agree with the provider?s findings (exceptions, if any, noted below).  I reviewed and directed the Emergency Department treatment given to Uvaldo Rising     A/P: 32 year old female well-known to the department with a history of pancreatitis  Dose of pain meds here  Screening labs  At the baseline  Outpatient supportive nonnarcotic medications and follow-up with GI    Critical Care Time:      Procedures:      S:  32 y.o. female  with a chief complaint of ABDOMINAL PAIN and VOMITING    O:  Patient Vitals for the past 72 hrs:   Temp Heart Rate Resp BP BP Mean SpO2 Weight   01/21/22 1923 97.6 F (36.4 C) 91 18 172/79 (!) 110 MM HG 94 % 89.5 kg (197 lb 4.8 oz)         Electronically signed by Reginia Naas, MD at 01/21/2022  8:25 PM EST

## 2022-01-21 NOTE — ED Notes (Signed)
Formatting of this note might be different from the original.  Pt with complaints of 10/10 generalized abdomen and back pain.   Describes pain as "sharp".   Allergies verified.   PO pain med administered.   See MAR.     VS updated.   Electronically signed by Ezzie Dural., RN at 01/21/2022 11:42 PM EST

## 2022-01-21 NOTE — ED Notes (Signed)
Formatting of this note is different from the original.     01/21/22 1945   Lab Draw   Lab Draw Time 1945   Lab Draw Site Left;Antecubital   Lab Draw Device Angiocatheter   Device Gauge 20   Labs Drawn with IV Start Yes   Lab Tubes Lavender;Blue;Green;SST       Electronically signed by Fabienne Bruns at 01/21/2022  7:50 PM EST

## 2022-01-21 NOTE — ED Notes (Signed)
Formatting of this note is different from the original.     01/21/22 1923   Vital signs   BP 172/79   BP Mean (!) 110 MM HG   Heart Rate 91   Temp 97.6 F (36.4 C)   Temp Source Tympanic   Resp 18   SpO2 94 %   Device (Oxygen Therapy) room air   Height 5\' 7"  (1.702 m)   Height Method Stated   Weight 89.5 kg (197 lb 4.8 oz)   BMI (Calculated) 30.89   Weight Method  Standing weight       Electronically signed by Etta Quill, [place] at 01/21/2022  7:25 PM EST

## 2022-01-21 NOTE — ED Provider Notes (Signed)
Formatting of this note is different from the original.    [place]    Time of Arrival:   01/21/22 1921    Final diagnoses:   [R10.84] Abdominal pain, generalized (Primary)   [Z87.19] History of pancreatitis   [K86.3] Pancreatic pseudocyst   [R10.9, G89.29] Chronic abdominal pain     Medical Decision Making:      Differential Diagnosis:     Gastritis, gastroenteritis, reflux, ulcer, pancreatitis, appendicitis, cholecystitis, colitis, abscess, diverticulitis, UTI, mesenteric ischemia, ischemic bowel, obstruction     Social Determinants of Health:  social factors reviewed, did not limit treatment                               Supplemental Historians include:  patient and medical records    ED Course:     Please see ED course below for more specific time stamps and rationale if applicable.    32 year old female with past history of acute pancreatitis, pancreatic pseudocyst, pancreatic stenting presents to emergency department for concerns of abdominal pain with nausea and vomiting.  She states that this pain feels extremely similar to her pancreatitis.      Screening laboratory workup ordered to assess for any recurrent pancreatitis, lipase found to be less than 20 which suggests this is not an acute etiology at this time.  CBC and i-STAT similarly showed no major derangements likely explains patient's presentation at this time.  They also showed no derangements that could be sequela of her presentation today.  Urinalysis was a technically contaminated catch but showed no evidence of urinary tract infection.    This patient received IV fluid bolus, Zofran, 1 mg Dilaudid.  She endorsed some relief on reassessment but states that the medication is wearing off.  After no acute etiology could be discerned, this patient was discharged home on Zofran, Tylenol, Bentyl. Of note, she has been somewhat well-known to other providers in the emergency department for concerns of pain management.   She was counseled on following up with gastroenterology for more specific recommendations on their end and was discharged with nonopiate pain medication to include Zofran (of which she stated relief), dicyclomine, famotidine.    ED Course as of 01/22/22 0343   Sat Jan 21, 2022   2134 Patient reassessed, she is still pacing the room and requesting pain medication.  She states that the Dilaudid wore off.  She has been discharged with his back.  Discussed with patient that she has no obvious acute abdominal pathology verified with multiple rounds of laboratory workup. [NK]   2300 Urinalysis w Micro Reflex Culture(!)  Contaminated catch [NK]     Documentation/Prior Results Review:  Initial ED Provider Note , Old medical records    Rhythm interpretation from monitor: N/A    Imaging Interpreted by me: Not Applicable    No orders to display     .     Discussion of Management with other Physicians, QHP or Appropriate Source:   None    .    Disposition:  Home    Discharge Medication List as of 01/21/2022 11:35 PM       START taking these medications    Details   !! dicyclomine (BENTYL) 20 mg PO TABS Take 1 Tab by Mouth 4 Times Daily As Needed (abdominal pain)., Disp-30 Tab, R-0, Normal     famotidine (PEPCID) 20 mg PO TABS Take 1 Tab by Mouth Every  12 hours., Disp-28 Tab, R-0, Normal     !! ondansetron (ZOFRAN) 4 mg PO ODT. Take 1 Tab by Mouth Every 8 Hours As Needed for Nausea (PRN FOR NAUSEA AND VOMITING). ALLOW TABLET TO DISSOLVE ON TONGUE., Disp-30 Tab, R-0, Normal      !! - Potential duplicate medications found. Please discuss with provider.       Chief Complaint   Patient presents with    ABDOMINAL PAIN    VOMITING     ABDOMINAL PAIN  Associated symptoms: nausea and vomiting    Associated symptoms: no chest pain, no chills, no constipation, no diarrhea, no dysuria, no fever and no shortness of breath    VOMITING  Associated symptoms: abdominal pain    Associated symptoms: no chills, no diarrhea and no fever       32 year old female with past history to include alcohol induced chronic pancreatitis, pancreatic pseudocyst, status postcholecystectomy presents to emergency department for concerns of persistent nausea and vomiting and epigastric abdominal pain.  She states that she has been feeling this way since she had her pancreatic stents placed in December.  She region thought her symptoms would resolve after she was able to have a bowel movement, states that her bowel movements have since been very regular and she still having considerable pain.  She endorses significant nausea and vomiting, is having trouble keeping foods down.    Note that she was recently seen at [facility] yesterday for the same complaint.  Screening labs obtained by them were within normal limits and not suggestive of acute pancreatitis or other pathology including CBC, BMP, lipase.     Review of Systems:  Constitutional:  Negative for fever, chills and malaise and fatigue.   Respiratory:  Negative for shortness of breath.    Cardiovascular:  Negative for chest pain and palpitations.   Gastrointestinal:  Positive for nausea, vomiting and abdominal pain. Negative for diarrhea and constipation.   Genitourinary:  Negative for dysuria, urgency and frequency.     Physical Exam  Vitals and nursing note reviewed.   Constitutional:       Appearance: Normal appearance.   Cardiovascular:      Rate and Rhythm: Normal rate and regular rhythm.      Pulses: Normal pulses.   Pulmonary:      Effort: Pulmonary effort is normal. No respiratory distress.      Breath sounds: Normal breath sounds.   Abdominal:      Tenderness: There is abdominal tenderness (Epigastric and RLQ). There is no guarding.   Musculoskeletal:         General: Normal range of motion.      Right lower leg: No edema.      Left lower leg: No edema.   Skin:     General: Skin is warm and dry.      Capillary Refill: Capillary refill takes less than 2 seconds.   Neurological:      General: No  focal deficit present.      Mental Status: She is alert.     Past Medical History:   Diagnosis Date    Alcohol-induced acute pancreatitis     no longer drinks EtOH regularly    Class 1 obesity with body mass index (BMI) of 32.0 to 32.9 in adult     Gallstones     per pt report     Past Surgical History:   Procedure Laterality Date    EGD WITH ULTRASOUND EXAMINATION N/A 01/04/2022  Procedure: EGD, WITH ENDOSCOPIC ULTRASOUND AXIOS STENT INSERTION;  Surgeon: Golda Acre, MD    LAP CHOLECYSTECTOMY N/A 02/10/2021    Procedure: CHOLECYSTECTOMY, LAPAROSCOPIC, WITH CHOLANGIOGRAM;  Surgeon: Wynelle Fanny, MD     Family History   Problem Relation Age of Onset    No Known Problems Mother     Other Family History Father         died from trauma     Social History     Occupational History    Not on file   Tobacco Use    Smoking status: Every Day     Types: Cigarettes    Smokeless tobacco: Never   Substance and Sexual Activity    Alcohol use: Not on file    Drug use: Not on file    Sexual activity: Not on file     Outpatient Medications Marked as Taking for the 01/21/22 encounter [facility] Encounter)   Medication Sig Dispense Refill    dicyclomine (BENTYL) 20 mg PO TABS Take 1 Tab by Mouth 4 Times Daily As Needed (abdominal pain). 30 Tab 0    famotidine (PEPCID) 20 mg PO TABS Take 1 Tab by Mouth Every 12 hours. 28 Tab 0    ondansetron (ZOFRAN) 4 mg PO ODT. Take 1 Tab by Mouth Every 8 Hours As Needed for Nausea (PRN FOR NAUSEA AND VOMITING). ALLOW TABLET TO DISSOLVE ON TONGUE. 30 Tab 0     Allergies   Allergen Reactions    Droperidol neurological reaction     Pt states "face twitching and couldn't stop legs from moving"     Vital Signs:  Patient Vitals for the past 72 hrs:   Temp Heart Rate Pulse Resp BP BP Mean SpO2 Weight   01/21/22 2335 -- -- 56 20 152/85 (!) 107 MM HG 97 % --   01/21/22 2220 97.9 F (36.6 C) 58 -- 16 115/62 80 MM HG 100 % --   01/21/22 2220 97.9 F (36.6 C) 74 -- 18 115/62 80 MM HG 99 % --    01/21/22 1923 97.6 F (36.4 C) 91 -- 18 172/79 (!) 110 MM HG 94 % 89.5 kg (197 lb 4.8 oz)     Diagnostics:  Labs:    Results for orders placed or performed during the hospital encounter of 01/21/22   Chem8, i-STAT (Lab)   Result Value Ref Range    POTASSIUM 3.3 (L) 3.5 - 5.5 mmol/L    CHLORIDE 103 98 - 110 mmol/L    CALCIUM IONIZED 4.9 4.4 - 5.4 mg/dL    CO2 25.0 20.0 - 32.0 mmol/L    Glucose 98 70 - 99 mg/dL    BUN 6 6 - 22 mg/dL    CREATININE 0.4 (L) 0.5 - 1.2 mg/dL    SODIUM 141 133 - 145 mmol/L    HGB 14.6 11.7 - 15.5 g/dL    HCT 43.0 35.1 - 46.5 %    POC-eGFR >60.0 >60.0    Cartridge type CHEM8+    PREGNANCY URINE POC (LAB)   Result Value Ref Range    PREGNANCY URINE Negative Negative   LIPASE   Result Value Ref Range    Lipase <20 7 - 60 U/L   CBC WITH DIFFERENTIAL AUTO   Result Value Ref Range    WBC 7.2 4.0 - 11.0 K/uL    RBC 4.73 3.80 - 5.20 M/uL    HGB 12.0 11.7 - 15.5 g/dL    HCT 34.1 (L) 35.1 -  46.5 %    MCV 72 (L) 80 - 99 fL    MCH 25 (L) 26 - 34 pg    MCHC 35 31 - 36 g/dL    RDW 54.2 (H) 70.6 - 15.5 %    Platelet 231 140 - 440 K/uL    MPV 11.0 9.0 - 13.0 fL    Segmented Neutrophils (Auto) 53 40 - 75 %    Lymphocytes (Auto) 38 20 - 45 %    Monocytes (Auto) 8 3 - 12 %    Eosinophils (Auto) 1 0 - 6 %    Basophils (Auto) 0 0 - 2 %    Absolute Neutrophils (Auto) 3.8 1.8 - 7.7 K/uL    Absolute Lymphocytes (Auto) 2.7 1.0 - 4.8 K/uL    Absolute Monocytes (Auto) 0.6 0.1 - 1.0 K/uL    Absolute Eosinophils (Auto) 0.1 0.0 - 0.5 K/uL    Absolute Basophils (Auto) 0.0 0.0 - 0.2 K/uL   PREGNANCY URINE (Lab)   Result Value Ref Range    PREGNANCY URINE Negative Negative   Urinalysis w Micro Reflex Culture    Specimen: Clean Catch Urine   Result Value Ref Range    Source Urine      Urine Color Dark Yellow Colorless, Pale Yellow, Light Yellow, Yellow, Dark Yellow, Straw    Urine Clarity Turbid (A) Clear, Slightly Cloudy    Urine pH 5.5 5.0 - 8.0 pH    Urine Protein Screen 100* (A) Negative, Trace mg/dL    Urine Glucose  Negative Negative mg/dL    Urine Ketones 40* (A) Negative mg/dL    Urine Occult Blood Negative Negative    Urine Specific Gravity 1.049 (H) 1.005 - 1.030    Urine Nitrite Negative Negative    Urine Leukocyte Esterase Trace (A) Negative    Urine Bilirubin See Ictotest (A) Negative    Urine Urobilinogen 1.0 <2.0 mg/dL mg/dL    Urine RBC 23-76 (A) Negative, 0-2 /hpf    Urine WBC 3-5 0 - 5 /hpf    Urine Bacteria Present (A) Negative    Squamous Epithelial Cells >20 (A) None, 0-2 /hpf    Hyaline Cast 3-5 (A) 0 - 2 /lpf    Urine Calcium Oxalate Crystals Present (A) (none)    Ictotest Negative Negative     ECG:  No results found for this visit on 01/21/22.    Medications ordered/given in the ED  Medications   HYDROmorphone (Dilaudid) injection 1 mg (1 mg IV Push Given 01/21/22 2020)   ondansetron (PF) (Zofran) injection 4 mg (4 mg Intravenous Given 01/21/22 2020)   sodium chloride (normal saline) 0.9% infusion (0 mL Intravenous stopped 01/21/22 2123)   dicyclomine (BentyL) capsule 10 mg (10 mg Oral Given 01/21/22 2220)   acetaminophen (TylenoL) tablet 650 mg (650 mg Oral Given 01/21/22 2335)   ondansetron (PF) (Zofran) injection 4 mg (4 mg Intravenous Given 01/22/22 0006)     Merlinda Frederick, DO  PGY-1, [facility] Emergency Medicine    This patient was seen and evaluated under the supervision of the attending/service team cosigned.   Note that dictation software was used in the generation of this medical record. Although proofread, please excuse any phonetic/grammatical errors in this document.        Electronically signed by Perrin Maltese, MD at 01/23/2022  8:22 AM EST

## 2022-01-21 NOTE — ED Triage Notes (Signed)
Formatting of this note might be different from the original.  Pt arrives via [facility] ([facility]) from home with complaints of abdominal pain, nausea and vomiting x 3 days. Hx of pancreatitis   Electronically signed by Abran Richard, RN at 01/21/2022  7:27 PM EST

## 2022-01-22 NOTE — ED Notes (Signed)
Formatting of this note might be different from the original.  Pt requesting to check back in while waiting for cab home after being discharged about 2 hours ago. C/O same abdominal pain and nausea.   Electronically signed by Abran Richard, RN at 01/22/2022  2:05 AM EST

## 2022-01-22 NOTE — ED Notes (Signed)
Formatting of this note is different from the original.     01/22/22 0204   Vital signs   BP 161/99   BP Mean (!) 120 MM HG   Heart Rate 88   Temp 98.5 F (36.9 C)   Temp Source Tympanic   Resp 16   SpO2 100 %   Device (Oxygen Therapy) room air   Height 5\' 5"  (1.651 m)   Height Method Stated   Weight 92.4 kg (203 lb 12.8 oz)   BMI (Calculated) 33.91   Weight Method  Standing weight   Pain (Adult)   Pain Goal/Acceptable Level 4       Electronically signed by Etta Quill, [place] at 01/22/2022  2:07 AM EST

## 2022-01-22 NOTE — ED Notes (Signed)
Formatting of this note is different from the original.  Allergies verified.   IV zofran administered.   See MAR.     PIV removed. Catheter remains intact.     Condition stable.  Patient discharged to home.  Patient education was completed:  yes  Education taught to:  patient  Teaching method used was discussion and handout.  Understanding of teaching was good.  Patient was discharged ambulatory.  Discharged with [employer].  Valuables were given to: patient.    New Prescriptions    DICYCLOMINE (BENTYL) 20 MG PO TABS    Take 1 Tab by Mouth 4 Times Daily As Needed (abdominal pain).    FAMOTIDINE (PEPCID) 20 MG PO TABS    Take 1 Tab by Mouth Every 12 hours.    ONDANSETRON (ZOFRAN) 4 MG PO ODT.    Take 1 Tab by Mouth Every 8 Hours As Needed for Nausea (PRN FOR NAUSEA AND VOMITING). ALLOW TABLET TO DISSOLVE ON TONGUE.       Electronically signed by Ezzie Dural., RN at 01/22/2022 12:11 AM EST

## 2022-01-22 NOTE — ED Notes (Signed)
Formatting of this note might be different from the original.  Pts fluids complete, made aware she is awaiting CT scan still at this time. VS updated and stable.   Electronically signed by Drema Dallas, RN at 01/22/2022 10:17 PM EST

## 2022-01-22 NOTE — ED Provider Notes (Signed)
Formatting of this note is different from the original.    [facility]    Time of Arrival:   01/22/22 1750    Final diagnoses:   [R10.9, G89.29] Chronic abdominal pain (Primary)   [R11.2] Nausea and vomiting, unspecified vomiting type   [N83.201] Cyst of right ovary     Medical Decision Making:      Patient is a 32 year old female past medical history of chronic alcoholic pancreatitis who is recently status post Axios stent placement and cystogastrostomy on 12/20 who presents for evaluation of abdominal pain, nausea and vomiting.  She has been seen on several occasions recently for the same.      She underwent CT of the abdomen pelvis with IV contrast on 01/14/2022.  Results as below.  Impression     1. Large rectal stool ball with findings suggestive of stercoral colitis.   2. Large colonic stool burden most pronounced at the transverse colon.   3. 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     A preliminary impression was recorded in PACS at 01/14/2022 11:47 PM by Rodena Medin, MD. The impression above reflects any changes made by the attending physician at the time of report signing.     Dictated ByRodena Medin on 01/14/2022 11:47 PM     As attending radiologist, I have assessed the study images, and dictated or reviewed/edited the final report as needed.     Signed By: Geronimo Running, MD on 01/14/2022 11:50 PM     Patient denies any fever or chills.  She denies any hematemesis.  She has not appreciated any blood in her stool or melena.  She denies any urinary symptoms.  Patient denies vaginal bleeding or discharge.  She denies chest pain or shortness of breath.  She has not felt lightheaded or dizzy.  Patient denies any rashes.  She is passing stools, denies any diarrhea.    Differential Diagnosis:  pancreatitis, post-operative problem, Aortic dissection, Mesenteric ischemia, ovarian torsion, perforated viscous,  ascending cholangitis, splenic rupture, Fitz-hugh-curtis, cholangitis, GERD, pneumonia, PUD, Gastritis, renal stones, pyelonephritis, Appendicitis, hernia, diverticulitis,  Ectopic pregnancy, ovarian torsion, ovarian cyst, PID,  period, fibroid, UTI, hyperthyroidism, shingles    Social Determinants of Health:  social factors reviewed, did not limit treatment                               Supplemental Historians include:  patient    ED Course:     Patient afebrile, not tachycardic or tachypneic, she is slightly hypotensive.  She does appear very uncomfortable, standing up in pain, unable to stay still and as such difficult to exam.    Will repeat labs, consider imaging if not improved.    Patient remained in pain after half milligram of Dilaudid.  She is given a second half milligram.  Laboratory results are reviewed.  Patient has no leukocytosis.  Her metabolic panel is normal.  There is no transaminitis.  Her lipase is normal.  Due to the patient's persistent pain, a repeat CT of the abdomen and pelvis is obtained.  This shows a new 3.2 cm likely hemorrhagic cyst in the right ovary, otherwise unremarkable study.  Doubt this is the etiology of the patient's ongoing symptoms.    She continues to be in pain.  She is given Toradol and additional dose of Dilaudid as well as Zofran.  Patient is reassessed.  Her pain is improved.  I discussed possible etiology of her symptoms as well as the best way forward and management.  I encouraged her to reach out to her GI doctor and primary care provider tomorrow and let them know she is in the emergency department on several occasions now for this discomfort.  Patient voices understanding.  She tolerated some p.o. prior to discharge.    Patient voices understanding is no further questions at this time.    Documentation/Prior Results Review:  Old medical records patient's frequent ER visits as well as her recent December admission and GI procedure notes are reviewed.    CT  ABD/PELVIS-IV ONLY   Final Result     1. No acute abnormality of the abdomen or pelvis.   2. Unchanged appearance of AXIOS stent.   3. New 3.2 cm right adnexal lesion, likely hemorrhagic cyst.     Dictated ByImagene Sheller on 01/22/2022 11:09 PM     As attending radiologist, I have assessed the study images, and dictated or reviewed/edited the final report as needed.     Signed By: Peterson Lombard, MD on 01/22/2022 11:37 PM         .     .    Disposition:  Home    Discharge Medication List as of 01/23/2022  1:05 AM       START taking these medications    Details   acetaminophen (TYLENOL) 500 mg PO TABS Take 2 Tabs by Mouth Every 6 Hours As Needed for up to 10 days., Disp-42 Tab, R-0, Normal         Chief Complaint   Patient presents with    ABDOMINAL PAIN    VOMITING     HPI   [facility] is a 32 year old female who presents today for evaluation of nausea and vomiting.  Patient states she has been dealing with chronic pancreatitis for the past year, worse in the last 2 weeks.   She was recently hospitalized for chronic pancreatitis and had Axios stent placed, discharged on 12/24.  Patient states despite this intervention, symptoms have persisted.    She has not been eating, notes poor appetite.  She has been taking some fluids but vomits them up. She denies fever, chills, cough or cold symptoms.  She denies chest pain or shortness of breath.  She denies any hemoptysis, hematemesis, melena or hematochezia.  She has been passing stools, this in the setting of recent CT which showed a stool ball and steri colitis.  She denies any vaginal bleeding or discharge.  She denies any rashes.    Patient denies recent tobacco alcohol or drug use.    Surgical history significant for cholecystectomy.    Review of Systems  As above  Physical Exam  Vitals and nursing note reviewed.   Constitutional:       Comments: Uncomfortable appearing.    HENT:      Head: Normocephalic and atraumatic.      Nose: Nose normal.      Mouth/Throat:       Mouth: Mucous membranes are moist.   Eyes:      Extraocular Movements: Extraocular movements intact.      Pupils: Pupils are equal, round, and reactive to light.   Cardiovascular:      Rate and Rhythm: Normal rate and regular rhythm.      Pulses: Normal pulses.      Heart sounds: Normal heart sounds. No murmur heard.  No friction rub. No gallop.   Pulmonary:      Effort: Pulmonary effort is normal. No respiratory distress.      Breath sounds: Normal breath sounds. No wheezing, rhonchi or rales.   Abdominal:      General: Abdomen is flat. Bowel sounds are normal. There is no distension.      Palpations: Abdomen is soft.      Tenderness: There is no right CVA tenderness, left CVA tenderness, guarding or rebound.      Hernia: No hernia is present.      Comments: Palpable tenderness in the epigastrium   Musculoskeletal:      Cervical back: Normal range of motion and neck supple. No rigidity or tenderness.      Right lower leg: No edema.      Left lower leg: No edema.   Skin:     General: Skin is warm and dry.      Coloration: Skin is not pale.      Findings: No rash.   Neurological:      General: No focal deficit present.      Mental Status: She is alert and oriented to person, place, and time. Mental status is at baseline.      Cranial Nerves: No cranial nerve deficit.      Motor: No weakness.   Psychiatric:         Mood and Affect: Mood normal.         Behavior: Behavior normal.     Past Medical History:   Diagnosis Date    Alcohol-induced acute pancreatitis     no longer drinks EtOH regularly    Class 1 obesity with body mass index (BMI) of 32.0 to 32.9 in adult     Gallstones     per pt report     Past Surgical History:   Procedure Laterality Date    EGD WITH ULTRASOUND EXAMINATION N/A 01/04/2022    Procedure: EGD, WITH ENDOSCOPIC ULTRASOUND AXIOS STENT INSERTION;  Surgeon: Golda Acre, MD    LAP CHOLECYSTECTOMY N/A 02/10/2021    Procedure: CHOLECYSTECTOMY, LAPAROSCOPIC, WITH CHOLANGIOGRAM;  Surgeon:  Wynelle Fanny, MD     Family History   Problem Relation Age of Onset    No Known Problems Mother     Other Family History Father         died from trauma     Social History     Occupational History    Not on file   Tobacco Use    Smoking status: Every Day     Types: Cigarettes    Smokeless tobacco: Never   Substance and Sexual Activity    Alcohol use: Not on file    Drug use: Not on file    Sexual activity: Not on file     Outpatient Medications Marked as Taking for the 01/22/22 encounter [facility] [place] Encounter)   Medication Sig Dispense Refill    acetaminophen (TYLENOL) 500 mg PO TABS Take 2 Tabs by Mouth Every 6 Hours As Needed for up to 10 days. 42 Tab 0     Allergies   Allergen Reactions    Droperidol neurological reaction     Pt states "face twitching and couldn't stop legs from moving"     Vital Signs:  No data found.    Diagnostics:  Labs:    Results for orders placed or performed during the [facility] encounter of 01/22/22   GLUCOSE SCREEN   Result Value Ref Range  Glucose POC 99 70 - 99 mg/dL   BASIC METABOLIC PANEL   Result Value Ref Range    Potassium 4.1 3.5 - 5.5 mmol/L    Sodium 141 133 - 145 mmol/L    Chloride 102 98 - 110 mmol/L    Glucose 97 70 - 99 mg/dL    Calcium 9.6 8.4 - 14.7 mg/dL    BUN 6 6 - 22 mg/dL    Creatinine 0.4 (L) 0.5 - 1.2 mg/dL    CO2 24 20 - 32 mmol/L    eGFR >60.0 >60.0 mL/min/1.73 sq.m.    Anion Gap 15.0 3.0 - 15.0 mmol/L   HEPATIC FUNCTION PANEL   Result Value Ref Range    Albumin 4.6 3.5 - 5.0 g/dL    Total Protein 7.7 6.4 - 8.3 g/dL    Globulin 3.1 2.0 - 4.0 g/dL    A/G Ratio 1.5 1.1 - 2.6 ratio    Bilirubin Total 0.4 0.2 - 1.2 mg/dL    Bilirubin Direct <8.2 0.0 - 0.3 mg/dL    SGOT (AST) 19 10 - 37 U/L    Alkaline Phosphatase 61 25 - 115 U/L    SGPT (ALT) 11 5 - 40 U/L   LIPASE   Result Value Ref Range    Lipase <20 7 - 60 U/L   CBC WITH DIFFERENTIAL AUTO   Result Value Ref Range    WBC 6.0 4.0 - 11.0 K/uL    RBC 4.88 3.80 - 5.20 M/uL    HGB 12.1 11.7 - 15.5 g/dL    HCT 95.6 21.3  - 08.6 %    MCV 72 (L) 80 - 99 fL    MCH 25 (L) 26 - 34 pg    MCHC 34 31 - 36 g/dL    RDW 57.8 (H) 46.9 - 15.5 %    Platelet 232 140 - 440 K/uL    MPV 10.6 9.0 - 13.0 fL    Segmented Neutrophils (Auto) 63 40 - 75 %    Lymphocytes (Auto) 28 20 - 45 %    Monocytes (Auto) 7 3 - 12 %    Eosinophils (Auto) 2 0 - 6 %    Basophils (Auto) 0 0 - 2 %    Absolute Neutrophils (Auto) 3.8 1.8 - 7.7 K/uL    Absolute Lymphocytes (Auto) 1.7 1.0 - 4.8 K/uL    Absolute Monocytes (Auto) 0.4 0.1 - 1.0 K/uL    Absolute Eosinophils (Auto) 0.1 0.0 - 0.5 K/uL    Absolute Basophils (Auto) 0.0 0.0 - 0.2 K/uL     ECG:  No results found for this visit on 01/22/22.    Medications ordered/given in the ED  Medications   Lactated Ringers (LR) infusion (0 mL Intravenous stopped 01/22/22 2149)   ondansetron (PF) (Zofran) injection 4 mg (4 mg IV Push Given 01/22/22 1919)   HYDROmorphone (Dilaudid) injection 0.5 mg (0.5 mg IV Push Given 01/22/22 1920)   HYDROmorphone (Dilaudid) injection 0.5 mg (0.5 mg IV Push Given 01/22/22 2037)   iohexol (OmniPaque) 350 mg iodine/mL solution 100 mL (100 mL Intravenous Given 01/22/22 2228)   ketorolac (ToradoL) injection 15 mg (15 mg Intravenous Given 01/22/22 2328)   HYDROmorphone (Dilaudid) injection 1 mg (1 mg IV Push Given 01/23/22 0002)   Lactated Ringers (LR) infusion (0 mL Intravenous stopped 01/23/22 0106)   ondansetron (PF) (Zofran) injection 4 mg (4 mg IV Push Given 01/23/22 0022)     Laurence Aly, MD  PGY-1  [facility]  Electronically signed by Mosetta Pigeon, MD at 02/01/2022  5:23 PM EST

## 2022-01-22 NOTE — ED Notes (Signed)
Formatting of this note might be different from the original.  Patient medicated per [facility] for abdominal pain     Allergies verified prior to administration. Patient verbalized understanding of medication purpose and side effects. No questions at this time.    Electronically signed by Drema Dallas, RN at 01/22/2022 11:30 PM EST

## 2022-01-22 NOTE — ED Notes (Signed)
Formatting of this note might be different from the original.  Pt with continued complaints of abdominal pain and nausea.   Allergies verified.   SL Zofran and IM Dilaudid administered.   See MAR.     Condition stable.  Patient discharged to home.  Patient education was completed:  yes  Education taught to:  patient  Teaching method used was discussion.  Understanding of teaching was good.  Patient was discharged ambulatory.  Discharged with cab ride home.  Valuables were given to: patient.    Setting up cab ride home for pt.   Electronically signed by Ezzie Dural., RN at 01/22/2022  3:24 AM EST

## 2022-01-22 NOTE — ED Provider Notes (Signed)
Formatting of this note is different from the original.    [facility]    Time of Arrival:   01/22/22 0203    Final diagnoses:   [R10.9, G89.29] Chronic abdominal pain (Primary)   [K86.1] Chronic pancreatitis, unspecified pancreatitis type [facility])     Medical Decision Making:      Differential Diagnosis:   Uncontrolled chronic abdominal pain and nausea in the setting of chronic pancreatitis, alcohol induced.  Do not suspect infection, owel obstruction, or alternative etiology to her pain.    Social Determinants of Health:  substance use                              Supplemental Historians include:  patient    ED Course: Patient is afebrile and hemodynamically stable upon arrival and nontoxic during initial encounter, but in obvious pain, similar to many previous visits for the same.  Seen earlier this evening, and previous to this on the first.   I do not suspect complication or alternative diagnosis at this time.  She had no leukocytosis or anemia.  Noted to be microcytic, likely iron deficient.  Basic chemistries demonstrated mild hypokalemia but otherwise no significant electrolyte imbalance or renal insufficiency.  Lipase was negative.  Urinalysis was contaminated, but negative pregnancy and overall suggestive of mild dehydration.  It has been cultured.  She has been given parenteral analgesics and IV fluids with improvement in her symptoms prior to discharge.  However, she is well-hydrated and not showing signs of clinical dehydration.  She has not vomited.  States she was delayed in getting out of the waiting room and was hoping she could get another dose of medications, if possible. Will provide a dose of IM dilaudid and PO zofran and continue with plans for outpatient follow-up.  States she has antiemetics at home, Zofran.  ED return indications agreed upon.      As of 01/22/2022, 10:42 AM  The differential diagnosis and / or critical care lists were considered including  infections, sepsis, severe sepsis, and septic shock and found unlikely unless otherwise documented in the final clinical impression or diagnosis list.     Documentation/Prior Results Review:  Initial ED Provider Note , Old medical records, Nursing notes, Previous radiology studies    Rhythm interpretation from monitor: N/A    Imaging Interpreted by me: Not Applicable    No orders to display     .     Discussion of Management with other Physicians, QHP or Appropriate Source:   None    .    Disposition:  Home    Discharge Medication List as of 01/22/2022  3:13 AM       Chief Complaint   Patient presents with    ABDOMINAL PAIN    NAUSEA     [facility] is a 32 y.o. female with a history of alcholic pancreatitis, who presents to the ED with complaints of continued/ recurrent abdominal pain after having been evaluated and discharged earlier this evening.  States she was waiting on a cab and symptoms returned.  Still hoping to get home.  Has nausea, but not vomiting.  BMs and UOP normal lately.  Denies fever.  Has tylenol at home and antiemetic to pick up at [facility].  The patient denies further associated symptoms.        Review of Systems:  Constitutional:  Negative for fever.   HENT:  Negative for  neck pain.    Respiratory:  Negative for shortness of breath.    Cardiovascular:  Negative for chest pain.   Gastrointestinal:  Positive for nausea and abdominal pain. Negative for vomiting, diarrhea, constipation and blood in stool.   Genitourinary:  Negative for dysuria.   Musculoskeletal:  Negative for myalgias.   Skin:  Negative for rash.   Neurological:  Negative for headaches.   Hematological:  does not bruise/bleed easily.     Physical Exam  Vitals and nursing note reviewed.   Constitutional:       Appearance: She is not diaphoretic.      Comments: Awake and alert female appearing in moderate obvious pain at rest.  No respiratory distress and speaks with a clear voice. Pleasant and appropriate in conversation.   HENT:       Head: Normocephalic and atraumatic.   Eyes:      General: No scleral icterus.     Conjunctiva/sclera: Conjunctivae normal.   Cardiovascular:      Rate and Rhythm: Normal rate and regular rhythm.      Heart sounds: Normal heart sounds.   Pulmonary:      Effort: Pulmonary effort is normal.      Breath sounds: Normal breath sounds.   Abdominal:      General: There is no distension.      Palpations: Abdomen is soft.      Comments: Normal-sounding percussion.  Tenderness to epigastrium with no murphy's sign or McBurney's point tenderness present.  No peritoneal signs.   Musculoskeletal:      Cervical back: Neck supple.   Skin:     General: Skin is warm and dry.   Neurological:      Mental Status: She is alert.     Past Medical History:   Diagnosis Date    Alcohol-induced acute pancreatitis     no longer drinks EtOH regularly    Class 1 obesity with body mass index (BMI) of 32.0 to 32.9 in adult     Gallstones     per pt report     Past Surgical History:   Procedure Laterality Date    EGD WITH ULTRASOUND EXAMINATION N/A 01/04/2022    Procedure: EGD, WITH ENDOSCOPIC ULTRASOUND AXIOS STENT INSERTION;  Surgeon: Golda Acre, MD    LAP CHOLECYSTECTOMY N/A 02/10/2021    Procedure: CHOLECYSTECTOMY, LAPAROSCOPIC, WITH CHOLANGIOGRAM;  Surgeon: Wynelle Fanny, MD     Family History   Problem Relation Age of Onset    No Known Problems Mother     Other Family History Father         died from trauma     Social History     Occupational History    Not on file   Tobacco Use    Smoking status: Every Day     Types: Cigarettes    Smokeless tobacco: Never   Substance and Sexual Activity    Alcohol use: Not on file    Drug use: Not on file    Sexual activity: Not on file     No outpatient medications have been marked as taking for the 01/22/22 encounter [facility] Encounter).     Allergies   Allergen Reactions    Droperidol neurological reaction     Pt states "face twitching and couldn't stop legs from moving"     Vital Signs:  Patient  Vitals for the past 72 hrs:   Temp Heart Rate Resp BP BP Mean SpO2 Weight   01/22/22  0204 98.5 F (36.9 C) 88 16 161/99 (!) 120 MM HG 100 % 92.4 kg (203 lb 12.8 oz)     Diagnostics:  Labs:  No results found for this visit on 01/22/22.  ECG:  No results found for this visit on 01/22/22.    Medications ordered/given in the ED  Medications   ondansetron (Zofran) tablet, rapid dissolve 4 mg (4 mg Oral Given 01/22/22 0320)   HYDROmorphone (Dilaudid) injection 1 mg (1 mg Intramuscular Given 01/22/22 0320)       Electronically signed by Elray Mcgregor, MD at 01/31/2022  3:43 AM EST

## 2022-01-22 NOTE — ED Notes (Signed)
Formatting of this note is different from the original.     01/22/22 1916   Lab Draw   Lab Draw Time 1915   Lab Sent Time 1915   Lab Draw Site Left;Antecubital   Lab Draw Device Angiocatheter   Device Gauge 20   # of Lab Draw Attempts 1 Attempt   Labs Drawn with IV Start Yes   Lab Tubes Rainbow     Patient medicated per MAR. Medications verified and checked.   Patients ID and allergies were verified with patient and cross checked with ID band prior to administration.   Patient verbalized understanding of medications being administered.    Electronically signed by Johnnette Barrios, RN at 01/22/2022  7:16 PM EST

## 2022-01-23 NOTE — ED Notes (Signed)
Formatting of this note might be different from the original.  Pt is discharged pending fluids completed per Dr. Sabra Heck.     Electronically signed by Lorin Kranzburg, RN at 01/23/2022 12:25 AM EST

## 2022-01-23 NOTE — ED Notes (Signed)
Formatting of this note is different from the original.     01/23/22 1750   Vital signs   BP 143/88   BP Mean (!) 106 MM HG   Heart Rate 74   Resp 22   SpO2 98 %   Device (Oxygen Therapy) room air   Pain (Adult)   (0-10) Pain Rating: Rest 10   Pain Location abdomen   Pain Management Interventions pain medication given       Electronically signed by Maggie Font, RN at 01/23/2022  5:52 PM EST

## 2022-01-23 NOTE — ED Notes (Signed)
Formatting of this note might be different from the original.  Pain assessment on discharge was 5.  Condition stable.  Patient discharged to home.  Patient education was completed:  yes  Education taught to:  patient  Teaching method used was discussion and handout.  Understanding of teaching was good.  Patient was discharged ambulatory.  Discharged with self.  Valuables were given to: patient.    Electronically signed by Lelon Frohlich, RN at 01/23/2022  1:15 AM EST

## 2022-01-23 NOTE — ED Provider Notes (Signed)
Formatting of this note is different from the original.    [facility] GENERAL EMERGENCY DEPARTMENT    Time of Arrival:   01/23/22 1607    Final diagnoses:   [N76.0, B96.89] Bacterial vaginosis (Primary)   [N83.201] Cyst of right ovary     Medical Decision Making:      Differential Diagnosis:   Ruptured ovarian cyst, ovarian torsion, chronic pancreatitis, acute pancreatitis, gastritis, gastroenteritis    Social Determinants of Health:  no primary care provider                               Supplemental Historians include:  patient    ED Course:   Rhonda Hammond is a 32 y.o. female presenting with lower abdominal pain, nausea and vomiting.   On my initial assessment patient appears well, nontoxic, vital signs stable.  Lab work is not indicated at this time patient had normal labs yesterday, will provide IM pain medication since IV unable to be obtained.  Lipase, LFTs, CBC, BMP all within normal limits yesterday 01/22/2022.  Provide medication for pain     CT abdomen pelvis not indicated at this time, patient had unremarkable CT abdomen pelvis yesterday, no changes in symptoms.    bilateral adnexal tenderness, no cervical motion tenderness, do not clinically suspect PID, cervix unremarkable.    Results  Urine pregnancy negative  UA contaminated sample with urine bacteria present, no leukocytes or nitrates. wet prep BV. TVUS right ovarian cyst.     Pain is improved after medication. Patient agreeable to dc home.     Instructed patient to follow-up with OB/GYN, provided information, given strict return precautions.  Patient verbalized understanding of return precautions.    Discharge with outpatient follow-up seems reasonable at this time. The results and plan of care were discussed, all questions sought and answered. At time of d/c, pt appears well hydrated, and is in no acute distress, no apparent emergent issue at this time. Pt will f/u with PCP and return as needed with any new or worsening symptoms or other  concerns (given strict return precautions and advised to return for any new or worsening symptoms). The patient is stable for discharge at this time.     I discussed the above case with Dr. Cleda Clarks and made aware of the patients case including but not limited to history, physical exam findings, lab, and imaging results. In agreement with my treatment plan.      As of 01/23/2022, 11:27 PM  The differential diagnosis and / or critical care lists were considered including infections, sepsis, severe sepsis, and septic shock and found unlikely unless otherwise documented in the final clinical impression or diagnosis list.     Documentation/Prior Results Review:  Initial ED Provider Note , Old medical records, Nursing notes, Previous radiology studies  Reviewed CT and lab work from 01/22/2022-right adnexal cyst  Rhythm interpretation from monitor: N/A    Imaging Interpreted by me: [place] no evidence of ovarian torsion per my review    US PELVIS NON-OB   Final Result     Echogenic 3.5 cm right ovarian lesion without internal blood flow, likely hemorrhagic cyst or dermoid.     Signed By: Madelynn Done, MD on 01/23/2022 8:12 PM         .     Discussion of Management with other Physicians, QHP or Appropriate Source:   None    .    Disposition:  Home    Discharge Medication List as of 01/23/2022  9:34 PM       START taking these medications    Details   oxyCODONE-acetaminophen (PERCOCET) 5-325 mg PO TABS Take 1 Tab by Mouth Every 6 Hours As Needed for up to 5 days., Disp-12 Tab, R-0, Normal         Chief Complaint   Patient presents with    ABDOMINAL PAIN     Rhonda Hammond is a 32 y.o. female presenting with abdominal pain.  Patient states she has been having abdominal pain for several weeks now notes that she is having epigastric pain with nausea and vomiting states this is unchanged but recently she has been having bilateral lower abdominal pain which is new for her.  Patient states she was recently diagnosed with ovarian cyst.   Patient states nothing in particular has changed about her pain except that her pain has not been relieved and she is having nausea and vomiting.  Denies urinary symptoms, denies vaginal bleeding or vaginal discharge.    ABDOMINAL PAIN  Associated symptoms: nausea and vomiting        Review of Systems:  Gastrointestinal:  Positive for nausea, vomiting and abdominal pain.     Physical Exam  Exam conducted with a chaperone present.   Constitutional:       General: She is not in acute distress.     Appearance: Normal appearance.   HENT:      Head: Normocephalic and atraumatic.      Nose: Nose normal.      Mouth/Throat:      Mouth: Mucous membranes are moist.   Eyes:      Extraocular Movements: Extraocular movements intact.      Pupils: Pupils are equal, round, and reactive to light.   Cardiovascular:      Rate and Rhythm: Normal rate and regular rhythm.      Pulses: Normal pulses.      Heart sounds: Normal heart sounds.   Pulmonary:      Effort: Pulmonary effort is normal.      Breath sounds: Normal breath sounds.   Abdominal:      Palpations: Abdomen is soft.      Tenderness: There is no abdominal tenderness.      Comments: TTP over epigastrium and bilateral lower abdomen.  No peritoneal signs, no guarding, no rigidity, no rebound tenderness.  No CVA tenderness.   Genitourinary:     Cervix: No cervical motion tenderness.      Adnexa:         Right: Tenderness present.         Left: Tenderness present.       Comments: Cervix unremarkable, no cervical motion tenderness.  Bilateral adnexal tenderness.  No vaginal bleeding.  Musculoskeletal:         General: Normal range of motion.      Cervical back: Normal range of motion and neck supple.   Skin:     General: Skin is warm.      Capillary Refill: Capillary refill takes less than 2 seconds.   Neurological:      Mental Status: She is alert.      Gait: Gait normal.   Psychiatric:         Mood and Affect: Mood normal.         Behavior: Behavior normal.     Past Medical  History:   Diagnosis Date    Alcohol-induced acute pancreatitis  no longer drinks EtOH regularly    Class 1 obesity with body mass index (BMI) of 32.0 to 32.9 in adult     Gallstones     per pt report     Past Surgical History:   Procedure Laterality Date    EGD WITH ULTRASOUND EXAMINATION N/A 01/04/2022    Procedure: EGD, WITH ENDOSCOPIC ULTRASOUND AXIOS STENT INSERTION;  Surgeon: Henrietta Dine, MD    LAP CHOLECYSTECTOMY N/A 02/10/2021    Procedure: CHOLECYSTECTOMY, LAPAROSCOPIC, WITH CHOLANGIOGRAM;  Surgeon: Wandra Scot, MD     Family History   Problem Relation Age of Onset    No Known Problems Mother     Other Family History Father         died from trauma     Social History     Occupational History    Not on file   Tobacco Use    Smoking status: Every Day     Types: Cigarettes    Smokeless tobacco: Never   Substance and Sexual Activity    Alcohol use: Not on file    Drug use: Not on file    Sexual activity: Not on file     Outpatient Medications Marked as Taking for the 01/23/22 encounter [facility] Encounter)   Medication Sig Dispense Refill    metroNIDAZOLE (FLAGYL) 500 mg PO TABS Take 1 Tab by Mouth Every 12 Hours. NO ALCOHOL 14 Tab 0    ondansetron (ZOFRAN) 4 mg PO ODT. Take 1 Tab by Mouth Every 8 Hours As Needed for Nausea (PRN FOR NAUSEA AND VOMITING). ALLOW TABLET TO DISSOLVE ON TONGUE. 12 Tab 0    oxyCODONE-acetaminophen (PERCOCET) 5-325 mg PO TABS Take 1 Tab by Mouth Every 6 Hours As Needed for up to 5 days. 12 Tab 0     Allergies   Allergen Reactions    Droperidol neurological reaction     Pt states "face twitching and couldn't stop legs from moving"     Vital Signs:  Patient Vitals for the past 72 hrs:   Temp Heart Rate Pulse Resp BP BP Mean SpO2 Weight   01/23/22 2149 98.5 F (36.9 C) -- 70 14 177/88 (!) 124 MM HG 97 % --   01/23/22 1750 -- 74 -- 22 143/88 (!) 106 MM HG 98 % --   01/23/22 1600 97.8 F (36.6 C) 93 -- 20 165/78 (!) 107 MM HG 100 % 89.9 kg (198 lb 3.2 oz)      Diagnostics:  Labs:    Results for orders placed or performed during the hospital encounter of 01/23/22   WET PREP    Specimen: Vaginal; Various culture specimens   Result Value Ref Range    PMN None None    Motile Trichomonas None None    Yeast None None    Clue Cells Moderate (A) None   Urinalysis w Micro Reflex Culture    Specimen: Clean Catch Urine   Result Value Ref Range    Source Urine      Urine Color Dark Yellow Colorless, Pale Yellow, Light Yellow, Yellow, Dark Yellow, Straw    Urine Clarity Cloudy (A) Clear, Slightly Cloudy    Urine pH 5.5 5.0 - 8.0 pH    Urine Protein Screen 30* (A) Negative, Trace mg/dL    Urine Glucose Negative Negative mg/dL    Urine Ketones 80* (A) Negative mg/dL    Urine Occult Blood Negative Negative    Urine Specific Gravity 1.025 1.005 - 1.030  Urine Nitrite Negative Negative    Urine Leukocyte Esterase Negative Negative    Urine Bilirubin Negative Negative    Urine Urobilinogen 1.0 <2.0 mg/dL mg/dL    Urine RBC 3-5 (A) Negative, 0-2 /hpf    Urine WBC 21-50 (A) 0 - 5 /hpf    Urine Bacteria Present (A) Negative    Squamous Epithelial Cells >20 (A) None, 0-2 /hpf    Hyaline Cast 6-10 (A) 0 - 2 /lpf   PREGNANCY URINE (Lab)   Result Value Ref Range    PREGNANCY URINE Negative Negative     ECG:  No results found for this visit on 01/23/22.    Medications ordered/given in the ED  Medications   HYDROmorphone (Dilaudid) injection 1 mg (1 mg Intramuscular Given 01/23/22 1750)   ondansetron (Zofran) tablet, rapid dissolve 4 mg (4 mg Oral Given 01/23/22 1755)   HYDROmorphone (Dilaudid) injection 1 mg (1 mg Intramuscular Given 01/23/22 2142)       Electronically signed by Amedeo Gory, MD at 01/24/2022 11:28 AM EST

## 2022-01-23 NOTE — ED Triage Notes (Signed)
Formatting of this note might be different from the original.  Patient brought in by EMS with complaint of abdominal pain, recently seen for the same and dx with poss cyst?  Patient also complains of NV  Electronically signed by Dellis Filbert, RN at 01/23/2022  4:10 PM EST

## 2022-01-23 NOTE — ED Notes (Signed)
Formatting of this note might be different from the original.  Medicated for pain per MAR. Allergies verified.     Pain assessment on discharge was tolerable.  Condition stable.  Patient discharged to home.  Patient education was completed:  yes  Education taught to:  patient  Teaching method used was discussion and handout.  Understanding of teaching was fair.  Patient was discharged ambulatory.  Discharged with self.  Valuables were given to: patient.   Electronically signed by Kavin Leech, RN at 01/23/2022  9:46 PM EST

## 2022-01-28 NOTE — ED Notes (Signed)
Formatting of this note might be different from the original.  Patient given bus pass.    Pain assessment on discharge was tolerable.  Condition stable.  Patient discharged to home.  Patient education was completed:  yes  Education taught to:  patient  Teaching method used was discussion.  Understanding of teaching was good.  Patient was discharged ambulatory.  Discharged with self.  Valuables were given to: patient.   Electronically signed by Rhys Martini, RN at 01/28/2022  5:41 AM EST

## 2022-01-28 NOTE — ED Provider Notes (Signed)
Formatting of this note is different from the original.    [facility] GENERAL EMERGENCY DEPARTMENT    Time of Arrival:   01/28/22 7124    Final diagnoses:   [R10.9, G89.29] Chronic abdominal pain (Primary)     Medical Decision Making:      Differential Diagnosis:       Abdominal pain  Pancreatitis  Chronic pancreatitis  Cannabis hyperemesis  Gastritis  PUD    Social Determinants of Health:  substance use                              Supplemental Historians include:  patient    ED Course:     Patient here well-known to ER with chronic abdominal pain due to pancreatitis.  Tender epigastric left upper quadrant.    Was recently seen for lower abdominal pain thought due to be due to BV and a 3.5 cm ovarian cyst.  No lower abdominal pain today doubt GYN etiology.  Labs reassuring.  Received Toradol and 2 rounds of medication feeling better and ready for discharge.  Considered imaging but do not think is necessary most recent CT less than 1 week ago on January 7 relatively unchanged.  Stable for discharge.  Discussed case with Dr. Rogene Houston who agrees with my assessment and plan.       As of 01/30/2022, 3:20 PM  The differential diagnosis and / or critical care lists were considered including infections, sepsis, severe sepsis, and septic shock and found unlikely unless otherwise documented in the final clinical impression or diagnosis list.     Documentation/Prior Results Review:  Old medical records, Nursing notes    Rhythm interpretation from monitor: N/A    Imaging Interpreted by me: Not Applicable    No orders to display     .     Discussion of Management with other Physicians, QHP or Appropriate Source:   None    .    Disposition:  Home    Discharge Medication List as of 01/28/2022  5:13 AM       START taking these medications    Details   docusate sodium (COLACE) 50 mg/5 mL PO LIQD Take 5 mL by Mouth Once a Day., Disp-473 mL, R-0, Normal     ondansetron (ZOFRAN) 4 mg PO ODT. Take 1 Tab by Mouth Every 8 Hours As  Needed for Nausea (PRN FOR NAUSEA AND VOMITING). ALLOW TABLET TO DISSOLVE ON TONGUE., Disp-12 Tab, R-0, Normal     oxyCODONE-acetaminophen (PERCOCET) 5-325 mg PO TABS Take 1 Tab by Mouth Every 6 Hours As Needed for up to 5 days., Disp-8 Tab, R-0, Normal         Chief Complaint   Patient presents with    ABDOMINAL PAIN     32 year old female history of chronic pancreatitis presents ED complaining of epigastric and left upper quadrant pain.  Similar to previous episodes of chronic pain/pancreatitis flare.  Denies drinking alcohol.  Recently seen for lower abdominal pain and had a ovarian cyst.  Today pain is upper without any vaginal symptoms.  Reports nausea and vomiting.        Review of Systems:  Constitutional:  Negative for fever and chills.   Respiratory:  Negative for cough.    Gastrointestinal:  Positive for abdominal pain. Negative for nausea, vomiting and diarrhea.   Skin:  Negative for rash.     Physical Exam  Vitals and nursing note reviewed.  Constitutional:       General: She is not in acute distress.     Appearance: Normal appearance. She is not toxic-appearing.   HENT:      Head: Normocephalic and atraumatic.      Mouth/Throat:      Mouth: Mucous membranes are moist.   Cardiovascular:      Rate and Rhythm: Normal rate.   Pulmonary:      Effort: Pulmonary effort is normal. No respiratory distress.      Breath sounds: Normal breath sounds. No wheezing, rhonchi or rales.   Abdominal:      Palpations: Abdomen is soft.      Tenderness: There is abdominal tenderness in the epigastric area and left upper quadrant. There is no guarding or rebound.   Skin:     General: Skin is warm and dry.      Findings: No rash.   Neurological:      Mental Status: She is alert.      Gait: Gait normal.   Psychiatric:         Mood and Affect: Mood normal.     Past Medical History:   Diagnosis Date    Alcohol-induced acute pancreatitis     no longer drinks EtOH regularly    Class 1 obesity with body mass index (BMI) of 32.0 to  32.9 in adult     Gallstones     per pt report     Past Surgical History:   Procedure Laterality Date    EGD WITH ULTRASOUND EXAMINATION N/A 01/04/2022    Procedure: EGD, WITH ENDOSCOPIC ULTRASOUND AXIOS STENT INSERTION;  Surgeon: Golda Acre, MD    LAP CHOLECYSTECTOMY N/A 02/10/2021    Procedure: CHOLECYSTECTOMY, LAPAROSCOPIC, WITH CHOLANGIOGRAM;  Surgeon: Wynelle Fanny, MD     Family History   Problem Relation Age of Onset    No Known Problems Mother     Other Family History Father         died from trauma     Social History     Occupational History    Not on file   Tobacco Use    Smoking status: Every Day     Types: Cigarettes    Smokeless tobacco: Never   Substance and Sexual Activity    Alcohol use: Not on file    Drug use: Not on file    Sexual activity: Not on file     Outpatient Medications Marked as Taking for the 01/28/22 encounter [facility] Encounter)   Medication Sig Dispense Refill    docusate sodium (COLACE) 50 mg/5 mL PO LIQD Take 5 mL by Mouth Once a Day. 473 mL 0    ondansetron (ZOFRAN) 4 mg PO ODT. Take 1 Tab by Mouth Every 8 Hours As Needed for Nausea (PRN FOR NAUSEA AND VOMITING). ALLOW TABLET TO DISSOLVE ON TONGUE. 12 Tab 0    oxyCODONE-acetaminophen (PERCOCET) 5-325 mg PO TABS Take 1 Tab by Mouth Every 6 Hours As Needed for up to 5 days. 8 Tab 0     Allergies   Allergen Reactions    Droperidol neurological reaction     Pt states "face twitching and couldn't stop legs from moving"     Vital Signs:  Patient Vitals for the past 72 hrs:   Temp Pulse Resp BP BP Mean SpO2 Weight   01/28/22 0500 98.7 F (37.1 C) 68 16 112/80 91 MM HG 99 % --   01/28/22 0037 98.9 F (37.2 C) 81  18 135/83 100 MM HG -- 89.8 kg (198 lb)     Diagnostics:  Labs:    Results for orders placed or performed during the hospital encounter of 01/28/22   BASIC METABOLIC PANEL   Result Value Ref Range    Potassium 3.8 3.5 - 5.5 mmol/L    Sodium 139 133 - 145 mmol/L    Chloride 104 98 - 110 mmol/L    Glucose 93 70 - 99  mg/dL    Calcium 9.2 8.4 - 68.3 mg/dL    BUN 5 (L) 6 - 22 mg/dL    Creatinine 0.4 (L) 0.5 - 1.2 mg/dL    CO2 23 20 - 32 mmol/L    eGFR >60.0 >60.0 mL/min/1.73 sq.m.    Anion Gap 12.0 3.0 - 15.0 mmol/L   LIPASE   Result Value Ref Range    Lipase <20 7 - 60 U/L   HEPATIC FUNCTION PANEL   Result Value Ref Range    Albumin 4.0 3.5 - 5.0 g/dL    Total Protein 6.7 6.4 - 8.3 g/dL    Globulin 2.7 2.0 - 4.0 g/dL    A/G Ratio 1.5 1.1 - 2.6 ratio    Bilirubin Total 0.3 0.2 - 1.2 mg/dL    Bilirubin Direct <4.1 0.0 - 0.3 mg/dL    SGOT (AST) 21 10 - 37 U/L    Alkaline Phosphatase 46 25 - 115 U/L    SGPT (ALT) 11 5 - 40 U/L   Pregnancy, Serum   Result Value Ref Range    PREGNANCY SERUM Negative Negative   CBC WITH DIFFERENTIAL AUTO   Result Value Ref Range    WBC 5.1 4.0 - 11.0 K/uL    RBC 4.43 3.80 - 5.20 M/uL    HGB 11.2 (L) 11.7 - 15.5 g/dL    HCT 96.2 (L) 22.9 - 46.5 %    MCV 70 (L) 80 - 99 fL    MCH 25 (L) 26 - 34 pg    MCHC 36 31 - 36 g/dL    RDW 79.8 (H) 92.1 - 15.5 %    Platelet 205 140 - 440 K/uL    MPV 10.9 9.0 - 13.0 fL    Segmented Neutrophils (Auto) 43 40 - 75 %    Lymphocytes (Auto) 43 20 - 45 %    Monocytes (Auto) 11 3 - 12 %    Eosinophils (Auto) 3 0 - 6 %    Basophils (Auto) 0 0 - 2 %    Absolute Neutrophils (Auto) 2.2 1.8 - 7.7 K/uL    Absolute Lymphocytes (Auto) 2.2 1.0 - 4.8 K/uL    Absolute Monocytes (Auto) 0.6 0.1 - 1.0 K/uL    Absolute Eosinophils (Auto) 0.2 0.0 - 0.5 K/uL    Absolute Basophils (Auto) 0.0 0.0 - 0.2 K/uL   REFLEX SLIDE REVIEW   Result Value Ref Range    Anisocytosis 1+ (A) (none)    Normochromic RBC Normochromic     Target Cells 2+ (A) (none)    Smear Evaluation Platelet morphology appears normal.    Urinalysis w Micro Reflex Culture    Specimen: Clean Catch Urine   Result Value Ref Range    Source Urine      Urine Color Dark Yellow Colorless, Pale Yellow, Light Yellow, Yellow, Dark Yellow, Straw    Urine Clarity Cloudy (A) Clear, Slightly Cloudy    Urine pH 6.0 5.0 - 8.0 pH    Urine Protein  Screen 30* (A) Negative, Trace  mg/dL    Urine Glucose Negative Negative mg/dL    Urine Ketones Trace (A) Negative mg/dL    Urine Occult Blood Negative Negative    Urine Specific Gravity 1.034 (H) 1.005 - 1.030    Urine Nitrite Negative Negative    Urine Leukocyte Esterase Negative Negative    Urine Bilirubin      Urine Urobilinogen 1.0 <2.0 mg/dL mg/dL    Urine RBC 3-5 (A) Negative, 0-2 /hpf    Urine WBC 3-5 0 - 5 /hpf    Urine Bacteria Present (A) Negative    Squamous Epithelial Cells >20 (A) None, 0-2 /hpf    Hyaline Cast 0-2 0 - 2 /lpf    Urine Calcium Oxalate Crystals Present (A) (none)    Ictotest Negative Negative     ECG:  No results found for this visit on 01/28/22.    Medications ordered/given in the ED  Medications   HYDROmorphone (Dilaudid) injection 0.5 mg (0.5 mg IV Push Given 01/28/22 0200)   ondansetron (PF) (Zofran) injection 4 mg (4 mg Intravenous Given 01/28/22 0201)   ketorolac (ToradoL) injection 15 mg (15 mg Intravenous Given 01/28/22 0201)   sodium chloride (normal saline) 0.9% infusion (1,000 mL Intravenous New Bag 01/28/22 0202)   HYDROmorphone (Dilaudid) injection 0.5 mg (0.5 mg IV Push Given 01/28/22 0406)       Electronically signed by Tiana Loft, MD at 02/02/2022  7:25 AM EST

## 2022-01-28 NOTE — ED Triage Notes (Signed)
Formatting of this note might be different from the original.  Pt arrives via EMS with c/o abdominal pain. HX of chronic abdominal pain and was "DX with cyst last time she was here"  Electronically signed by Drema Dallas, RN at 01/28/2022 12:37 AM EST

## 2022-02-01 ENCOUNTER — Inpatient Hospital Stay
Admit: 2022-02-01 | Discharge: 2022-02-01 | Disposition: A | Discharge status: Home / Self Care | Payer: PRIVATE HEALTH INSURANCE | Attending: Emergency Medicine

## 2022-02-01 DIAGNOSIS — R109 Unspecified abdominal pain: Secondary | ICD-10-CM

## 2022-02-01 LAB — LIPASE: Lipase: 26 U/L (ref 12–53)

## 2022-02-01 LAB — CBC WITH AUTO DIFFERENTIAL
Basophils: 0.2 % (ref 0–3)
Eosinophils: 1.6 % (ref 0–5)
Hematocrit: 32.9 % — ABNORMAL LOW (ref 35.0–47.0)
Hemoglobin: 11.2 gm/dl (ref 11.0–16.0)
Immature Granulocytes: 0.2 % (ref 0.0–3.0)
Lymphocytes: 31.4 % (ref 28–48)
MCH: 24.8 pg — ABNORMAL LOW (ref 25.4–34.6)
MCHC: 34 gm/dl (ref 30.0–36.0)
MCV: 72.9 fL — ABNORMAL LOW (ref 80.0–98.0)
MPV: 10.5 fL — ABNORMAL HIGH (ref 6.0–10.0)
Monocytes: 7.1 % (ref 1–13)
Neutrophils Segmented: 59.5 % (ref 34–64)
Nucleated RBCs: 0 (ref 0–0)
Platelets: 211 10*3/uL (ref 140–450)
RBC: 4.51 M/uL (ref 3.60–5.20)
RDW: 43.8 (ref 36.4–46.3)
WBC: 5.8 10*3/uL (ref 4.0–11.0)

## 2022-02-01 LAB — POCT URINALYSIS DIPSTICK
Bilirubin, Urine: NEGATIVE
Glucose, Ur: NEGATIVE mg/dl
Ketones, Urine: NEGATIVE mg/dl
Leukocyte Esterase, Urine: NEGATIVE
Nitrite, Urine: NEGATIVE
Protein, Urine: 30 mg/dl — AB
Specific Gravity, Urine: >=1.03 (ref 1.005–1.030)
Urobilinogen, Urine: 0.2 EU/dl (ref 0.0–1.0)
pH, Urine: 6 (ref 5–9)

## 2022-02-01 LAB — COMPREHENSIVE METABOLIC PANEL
ALT: 13 U/L (ref 10–49)
AST: 18 U/L (ref 0.0–33.9)
Albumin: 3.8 gm/dl (ref 3.4–5.0)
Alkaline Phosphatase: 50 U/L (ref 46–116)
Anion Gap: 5 mmol/L (ref 5–15)
BUN: <5 mg/dl — ABNORMAL LOW (ref 9–23)
CO2: 25 mEq/L (ref 20–31)
Calcium: 9.3 mg/dl (ref 8.7–10.4)
Chloride: 109 mEq/L — ABNORMAL HIGH (ref 98–107)
Creatinine: 0.54 mg/dl — ABNORMAL LOW (ref 0.55–1.02)
GFR African American: >60
GFR Non-African American: >60
Glucose: 98 mg/dl (ref 74–106)
Potassium: 4.2 mEq/L (ref 3.5–5.1)
Sodium: 139 mEq/L (ref 136–145)
Total Bilirubin: 0.2 mg/dl — ABNORMAL LOW (ref 0.30–1.20)
Total Protein: 7.3 gm/dl (ref 5.7–8.2)

## 2022-02-01 LAB — POC PREGNANCY UR-QUAL: Pregnancy, Urine: NEGATIVE

## 2022-02-01 MED ORDER — PROCHLORPERAZINE EDISYLATE 10 MG/2ML IJ SOLN
10 MG/2ML | INTRAMUSCULAR | Status: AC
Start: 2022-02-01 — End: 2022-02-01
  Administered 2022-02-01: 17:00:00 10 mg via INTRAVENOUS

## 2022-02-01 MED ORDER — ONDANSETRON 4 MG PO TBDP
4 MG | ORAL_TABLET | Freq: Three times a day (TID) | ORAL | 0 refills | Status: DC | PRN
Start: 2022-02-01 — End: 2022-04-15

## 2022-02-01 MED ORDER — KETOROLAC TROMETHAMINE 15 MG/ML IJ SOLN
15 MG/ML | Freq: Once | INTRAMUSCULAR | Status: AC
Start: 2022-02-01 — End: 2022-02-01
  Administered 2022-02-01: 17:00:00 15 mg via INTRAVENOUS

## 2022-02-01 MED ORDER — SODIUM CHLORIDE (PF) 0.9 % IJ SOLN
0.9 % | INTRAMUSCULAR | Status: AC
Start: 2022-02-01 — End: 2022-02-01
  Administered 2022-02-01: 17:00:00 20 mg via INTRAVENOUS

## 2022-02-01 MED ORDER — SODIUM CHLORIDE 0.9 % IV BOLUS
0.9 % | Freq: Once | INTRAVENOUS | Status: AC
Start: 2022-02-01 — End: 2022-02-01
  Administered 2022-02-01: 17:00:00 1000 mL via INTRAVENOUS

## 2022-02-01 MED ORDER — DICYCLOMINE HCL 20 MG PO TABS
20 MG | ORAL_TABLET | Freq: Four times a day (QID) | ORAL | 0 refills | Status: AC
Start: 2022-02-01 — End: 2022-02-11

## 2022-02-01 MED ORDER — DIPHENHYDRAMINE HCL 50 MG/ML IJ SOLN
50 MG/ML | INTRAMUSCULAR | Status: AC
Start: 2022-02-01 — End: 2022-02-01
  Administered 2022-02-01: 17:00:00 25 mg via INTRAVENOUS

## 2022-02-01 MED FILL — KETOROLAC TROMETHAMINE 15 MG/ML IJ SOLN: 15 MG/ML | INTRAMUSCULAR | Qty: 1

## 2022-02-01 MED FILL — SODIUM CHLORIDE 0.9 % IV SOLN: 0.9 % | INTRAVENOUS | Qty: 1000

## 2022-02-01 MED FILL — FAMOTIDINE (PF) 20 MG/2ML IV SOLN: 20 MG/2ML | INTRAVENOUS | Qty: 2

## 2022-02-01 MED FILL — PROCHLORPERAZINE EDISYLATE 10 MG/2ML IJ SOLN: 10 MG/2ML | INTRAMUSCULAR | Qty: 2

## 2022-02-01 MED FILL — DIPHENHYDRAMINE HCL 50 MG/ML IJ SOLN: 50 MG/ML | INTRAMUSCULAR | Qty: 1

## 2022-02-01 NOTE — ED Provider Notes (Signed)
I performed the substantive portion of the visit.   Medical decision-making: For the problems addressed, I personally developed, reviewed, and/or approved the plan and assessment as documented by the APP    I interviewed and examined the patient. I discussed with the mid-level provider and agree with their evaluation and plan as documented here.        She has chronic abdominal pain    Workup unrevealing/reassuring    Will discharge to f/up with her doctors        Lennart Pall, MD  02/01/22 1251

## 2022-02-01 NOTE — Discharge Instructions (Signed)
Follow up with your gastroenterologist. Let them know you were seen in the ER and need close follow up  Take medications as prescribed  Return to the ER if condition worsens or new symptoms develop.

## 2022-02-01 NOTE — ED Notes (Signed)
Pt states she wants to leave, PA made aware.      Christella Scheuermann, RN  02/01/22 1245

## 2022-02-01 NOTE — ED Notes (Addendum)
Patient very upset with the medication combination stating its, "not going to work", and "Toradol doesn't do anything". Pt appears very anxious.      Christella Scheuermann, RN  02/01/22 1239       Christella Scheuermann, RN  02/01/22 1239

## 2022-02-01 NOTE — ED Notes (Signed)
12:54 PM  02/01/22     Discharge instructions given to [facility] (name) with verbalization of understanding. Patient accompanied by patient.  Patient discharged with the following prescriptions      Medication List        START taking these medications      dicyclomine 20 MG tablet  Commonly known as: BENTYL  Take 1 tablet by mouth 4 times daily for 10 days            CONTINUE taking these medications      ondansetron 4 MG disintegrating tablet  Commonly known as: ZOFRAN-ODT  Take 1 tablet by mouth 3 times daily as needed for Nausea or Vomiting            ASK your doctor about these medications      * albuterol 0.63 MG/3ML nebulizer solution  Commonly known as: ACCUNEB     * albuterol sulfate HFA 108 (90 Base) MCG/ACT inhaler  Commonly known as: PROVENTIL;VENTOLIN;PROAIR  Inhale 2 puffs into the lungs every 6 hours as needed for Wheezing     famotidine 20 MG tablet  Commonly known as: Pepcid  Take 1 tablet by mouth 2 times daily for 10 days     pantoprazole 40 MG tablet  Commonly known as: PROTONIX  Take 1 tablet by mouth 2 times daily (before meals)     sucralfate 1 GM tablet  Commonly known as: Carafate  Take 1 tablet by mouth 4 times daily for 10 days           * This list has 2 medication(s) that are the same as other medications prescribed for you. Read the directions carefully, and ask your doctor or other care provider to review them with you.                   Where to Get Your Medications        These medications were sent to [id number] - [place], [address]  [place], [place] [address]      Phone: [phone]   dicyclomine 20 MG tablet  ondansetron 4 MG disintegrating tablet      . Patient discharged to Home (destination).      Christella Scheuermann, RN      Christella Scheuermann, RN  02/01/22 1254

## 2022-02-01 NOTE — ED Triage Notes (Signed)
Abd pain and vomiting for 2 days hx of panreatitis

## 2022-02-01 NOTE — ED Provider Notes (Cosign Needed)
[facility]  Emergency Department Treatment Report    Patient: Rhonda Hammond Age: 32 y.o. Sex: female    Date of Birth: [birth year] Admit Date: 02/01/2022 PCP: Danny Lawless, MD   MRN: [id number]  CSN: [id number]     Room: 109/EO09 Time Dictated: 12:57 PM      Attending Physician: Christiana Pellant, MD   Physician Assistant: Asencion Islam, PA-C    Chief Complaint   Chief Complaint   Patient presents with    Abdominal Pain    Emesis       History of Present Illness   32 y.o. female with history of alcohol and cocaine abuse, pancreatitis, gastritis who presents to the ED complaining of generalized abdominal pain associate with nausea and vomiting for the past 2 days.  She had 2 episodes of nonbloody emesis.  Denies fevers, chills, diarrhea, dysuria, hematuria.  She states this is the same pain that she has been having and has been evaluated for at multiple [place] facilities.  She is trying to look for a gastroenterologist    She has not had any alcohol for the past several weeks.  She admits to tobacco and marijuana use    INDEPENDENT HISTORIAN:  History and/or plan development assisted by: none     Review of Systems   Constitutional: No fever or chills  Eyes: No visual symptoms  ENT: No sore throat, runny nose, or other URI symptoms  Respiratory: No cough or shortness or breath  Cardiovascular: No chest pain  Gastrointestinal: as noted above  Genitourinary: No dysuria or hematuria  Musculoskeletal: No swelling  Integumentary: No rashes  Neurological: No headaches  Psychiatric: No HI or SI  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     Intractable nausea and vomiting 06/26/2020    Obesity 12/16/2018    Ovarian cyst     Pancreatitis      Past Surgical History:   Procedure Laterality Date    CESAREAN SECTION       CHOLECYSTECTOMY      UPPER GASTROINTESTINAL ENDOSCOPY N/A 10/12/2021    EGD DIAGNOSTIC performed by Elberta Spaniel, MD at [facility] ENDOSCOPY         Social History     Social History     Socioeconomic History    Marital status: Single     Spouse name: None    Number of children: None    Years of education: None    Highest education level: None   Tobacco Use    Smoking status: Every Day     Types: Cigars   Vaping Use    Vaping Use: Never used   Substance and Sexual Activity    Alcohol use: Yes     Alcohol/week: 21.0 standard drinks of alcohol     Types: 21 Shots of liquor per week     Comment: 2-3 drinks a day, sometimes more    Drug use: Yes     Frequency: 7.0 times per week     Types: Marijuana Sheran Fava)     Social Determinants of Health     Food Insecurity: No Food Insecurity (12/16/2021)    Hunger Vital Sign     Worried About Running Out of Food in the Last Year: Never true  Ran Out of Food in the Last Year: Never true   Transportation Needs: No Transportation Needs (12/16/2021)    PRAPARE - Therapist, art (Medical): No     Lack of Transportation (Non-Medical): No   Housing Stability: Low Risk  (12/16/2021)    Housing Stability Vital Sign     Unable to Pay for Housing in the Last Year: No     Number of Places Lived in the Last Year: 1     Unstable Housing in the Last Year: No       Family History     Family History   Problem Relation Age of Onset    No Known Problems Mother        Home Medications     Discharge Medication List as of 02/01/2022 12:51 PM        CONTINUE these medications which have NOT CHANGED    Details   famotidine (PEPCID) 20 MG tablet Take 1 tablet by mouth 2 times daily for 10 days, Disp-20 tablet, R-0Normal      sucralfate (CARAFATE) 1 GM tablet Take 1 tablet by mouth 4 times daily for 10 days, Disp-40 tablet, R-0Normal      pantoprazole (PROTONIX) 40 MG tablet Take 1 tablet by mouth 2 times daily (before meals), Disp-60 tablet, R-1Normal      albuterol sulfate HFA  (PROVENTIL;VENTOLIN;PROAIR) 108 (90 Base) MCG/ACT inhaler Inhale 2 puffs into the lungs every 6 hours as needed for Wheezing, Disp-18 g, R-3Normal      albuterol (ACCUNEB) 0.63 MG/3ML nebulizer solution Inhale 0.63 mg into the lungs every 6 hours as neededHistorical Med              Allergies     Allergies   Allergen Reactions    Droperidol Other (See Comments)     Face twitching, drooling and spontaneous leg movement       Physical Exam   BP 123/82   Pulse 63   Temp 98.6 F (37 C) (Oral)   Resp 20   Ht 1.651 m (5\' 5" )   Wt 89.8 kg (198 lb)   SpO2 99%   BMI 32.95 kg/m     General appearance: Well developed, well nourished female sitting up in bed and appears comfortable  Eyes: Conjunctivae clear, non-icteric  ENT: Ears/Nose: Hearing is grossly intact to voice. Internal and external examinations of the ears and nose are unremarkable.  Respiratory: Clear to auscultation bilaterally  Cardiovascular: Regular, rate and rhythm  GI: Abdomen soft, nondistended, diffusely tender with palpation.  No rebound or guarding.  Musculoskeletal: Patient able to move all four extremities. No peripheral edema  Skin: Warm and dry without rashes  Neurologic: Alert, oriented. Answers questions appropriately    Impression and Management Plan   This is a 32 y.o. female with history of alcohol and cocaine abuse, pancreatitis, gastritis who presents to the ED who presents to the ED complaining of same chronic abdominal pain associate with nausea and vomiting.  Labs obtained prior to my evaluation.  Will treat symptomatically.    Differential diagnoses: Acute on chronic abdominal pain, pancreatitis, gastritis, AKI, electro abnormality, dehydration    Diagnostic Studies   Lab:   Labs Reviewed   CBC WITH AUTO DIFFERENTIAL - Abnormal; Notable for the following components:       Result Value    Hematocrit 32.9 (*)     MCV 72.9 (*)     MCH 24.8 (*)  MPV 10.5 (*)     All other components within normal limits    Narrative:     * ATTENTION  *   COMPREHENSIVE METABOLIC PANEL - Abnormal; Notable for the following components:    Chloride 109 (*)     BUN <5 (*)     Creatinine 0.54 (*)     Total Bilirubin 0.20 (*)     All other components within normal limits   POCT URINALYSIS DIPSTICK - Abnormal; Notable for the following components:    Blood, Urine Large (*)     Protein, Urine 30 (*)     All other components within normal limits   LIPASE   POC PREGNANCY UR-QUAL         ED Course/MDM     [facility] RECORDS REVIEWED: patient admitted on 12/15/2021 for acute pancreatitis due to alcohol. Patient had a CT scan    IMPRESSION:     1. Definite acute pancreatitis.  2. 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  3. Unchanged left ovarian cyst appearing 2.6 cm.       EXTERNAL RESULTS REVIEWED: I reviewed the patient's chart via Care Everywhere.  Patient seen on 01/28/2022 for chronic abdominal pain. She had unremarkable labs    Patient had an [place] on 01/23/2022    US PELVIS NON-OB    Impression      Echogenic 3.5 cm right ovarian lesion without internal blood flow, likely hemorrhagic cyst or dermoid.      Patient had a CT scan on 01/22/2021    CT ABD/PELVIS-IV ONLY    Impression      1. No acute abnormality of the abdomen or pelvis.   2. Unchanged appearance of AXIOS stent.   3. New 3.2 cm right adnexal lesion, likely hemorrhagic cyst.     Severe exacerbation or progression of chronic illness: chronic abdominal pain    Threat to body function without evaluation and management:  GI     SOCIAL DETERMINANTS  impacting Evaluation and Management: Transportation    Substance abuse    Lack of support        Comorbidities impacting Evaluation and Management: alcohol and cocaine abuse, pancreatitis, gastritis    NARRATIVE:    ED Course as of 02/01/22 1257   Wed Feb 01, 2022   1213 Nitrite, Urine: Negative [AA]   1213 Leukocyte Esterase, Urine: Negative [AA]   1213 Pregnancy, Urine: negative [AA]   1234 WBC: 5.8 [AA]   1234 Hemoglobin Quant: 11.2  [AA]   1234 Hematocrit(!): 32.9 [AA]   1247 BUN,BUNPL(!): <5 [AA]   1247 Creatinine(!): 0.54 [AA]      ED Course User Index  [AA] Astria Jordahl R, PA       Patient stable in the ER.  CBC with no exostosis or significant anemia.  CMP and lipase unremarkable.  Urinalysis unremarkable for infectious process or pregnancy.  This is a chronic issue for the patient.  She had recent CT scan and ultrasound.  I reviewed the patient's chart.  Patient with multiple ER visits.  Patient usually given Dilaudid.  Patient received symptomatic treatment.  I was notified that the patient was upset due to the medications ordered.  Patient wanted to leave.  I did not speak with the patient prior to leaving.    Medications   sodium chloride 0.9 % bolus 1,000 mL (0 mLs IntraVENous Stopped 02/01/22 1248)   ketorolac (TORADOL) injection 15 mg (15  mg IntraVENous Given 02/01/22 1225)   diphenhydrAMINE (BENADRYL) injection 25 mg (25 mg IntraVENous Given 02/01/22 1225)   prochlorperazine (COMPAZINE) injection 10 mg (10 mg IntraVENous Given 02/01/22 1225)   famotidine (PEPCID) 20 mg in sodium chloride (PF) 0.9 % 10 mL injection (20 mg IntraVENous Given 02/01/22 1225)       Final Diagnosis       ICD-10-CM    1. Chronic abdominal pain  R10.9     G89.29       2. Nausea and vomiting, unspecified vomiting type  R11.2       3. Dehydration  E86.0         Disposition   Patient discharged home in stable condition with the instructions stated above. Prescription for   Discharge Medication List as of 02/01/2022 12:51 PM        START taking these medications    Details   ondansetron (ZOFRAN-ODT) 4 MG disintegrating tablet Take 1 tablet by mouth 3 times daily as needed for Nausea or Vomiting, Disp-21 tablet, R-0Normal      dicyclomine (BENTYL) 20 MG tablet Take 1 tablet by mouth 4 times daily for 10 days, Disp-40 tablet, R-0Normal          given.        The patient was personally evaluated by myself and discussed with Christiana Pellant, MD  who agrees with the  above assessment and plan      Asencion Islam, PA-C  February 01, 2022    [facility] medical dictation software was used for portions of this report. Unintended errors may occur.     My signature above authenticates this document and my orders, the final    diagnosis (es), discharge prescription (s), and instructions in the Epic    record.  If you have any questions please contact [phone].     Nursing notes have been reviewed by the physician/ advanced practice    Clinician.     Everardo All, PA  02/01/22 1257

## 2022-02-10 NOTE — ED Notes (Signed)
Formatting of this note might be different from the original.  Pt requesting to leave AMA. Pt refused to stay for lab work. Pt also refused to sign AMA thought this RN witnessed pt verbally discuss with DR Araceli Bouche understanding of risks of leaving AMA. PIV removed and pt ambulated out of department    Electronically signed by Deboraha Sprang, RN at 02/10/2022 12:22 PM EST

## 2022-02-10 NOTE — ED Provider Notes (Signed)
Formatting of this note is different from the original.    [facility] GENERAL EMERGENCY DEPARTMENT    Time of Arrival:   02/10/22 1027    Final diagnoses:   [R10.13] Abdominal pain, epigastric (Primary)   [R11.2] Nausea and vomiting, unspecified vomiting type     Medical Decision Making:      Differential Diagnosis:   Pancreatitis, gastritis, gastroparesis, ileus, functional GI disorder, less likely UTI, other biliary tree pathology, cannabis hyperemesis syndrome, other intraabdominal infection    Social Determinants of Health:  social factors reviewed, did not limit treatment                               Supplemental Historians include:  patient    ED Course: Patient appears anxious and uncomfortable but otherwise well-appearing with mild tenderness to epigastrium and RUQ.  Discussed obtaining labs and treatment, do not think there is indication for CT at this time given report of usual pain and multitude of recent CT scans and patient in agreement to not CT at this time.  When discussing treatment, explained plan to treat with non-opiates at least initially given that these can make GI dysfunction worse.  Patient immediately stating that she wants to leave and does not want any labs or treatment.  Urged patient to try other treatments and allow laboratory evaluation.  Initially she stated she would like zofran however within a minute stepped out from chair asking RN to remove IV stating she would like to leave and does not want to wait for labs or have any non-opiate treatment.  Discussed risks of leaving at this time including undiagnosed condition.  Patient states she understands risks and accepts them, RN witnessed conversation.  Patent left AGAINST MEDICAL ADVICE.        Documentation/Prior Results Review:  Nursing notes, external notes    Rhythm interpretation from monitor: N/A    Imaging Interpreted by me: Not Applicable    No orders to display     .     Discussion of Management with other Physicians,  QHP or Appropriate Source:   None    .    Disposition:  AMA    New Prescriptions    No medications on file     Chief Complaint   Patient presents with    ABDOMINAL PAIN     Patient with history of alcohol induced pancreatitis, pancreatic pseudocyst, chronic abdominal pain, and chronic nausea and vomiting with very frequent emergency department visits for same including 8 visits so far this month as well as per care everywhere a visit on January 5 at [facility] with plan to see GI and continue taking Protonix with CT scan last month noting cholecystectomy and presence of pancreatic stent with improved pancreatic pseudocyst presents today reporting usual symptoms.  Patient states that her primary care doctor and insurance company are looking for a gastroenterologist for her.  She states abdominal pain and vomiting started today and she has vomited once.  Patient states that she had a normal bowel movement this morning.  She says that she has Zofran at home that she dissolves under her tongue that did not help and also that Tylenol and Aleve have not helped.  Patient states she has not had alcohol for 2 weeks.  Patient states that she continues to use marijuana because she stopped for 1 to 2 months last year and there was no improvement in her symptoms so  she does not think this is contributing.  Patient states the pain is in the middle upper part of her abdomen, it is sharp and aching, and is 9/10 similar to past episodes.  Patient has no other symptoms.        Review of Systems:  Constitutional:  Negative for fever.   HENT:  Negative for congestion and sore throat.    Respiratory:  Negative for cough and shortness of breath.    Cardiovascular:  Negative for chest pain.   Gastrointestinal:  Positive for nausea, vomiting and abdominal pain. Negative for diarrhea and constipation.   Genitourinary:  Negative for dysuria and flank pain.     Physical Exam  Vitals and nursing note reviewed.   Constitutional:       General:  She is not in acute distress.     Appearance: Normal appearance. She is not ill-appearing.   HENT:      Mouth/Throat:      Mouth: Mucous membranes are moist.   Cardiovascular:      Rate and Rhythm: Normal rate and regular rhythm.      Heart sounds: Normal heart sounds.   Pulmonary:      Effort: Pulmonary effort is normal.      Breath sounds: Normal breath sounds.   Abdominal:      Palpations: Abdomen is soft.      Tenderness: There is abdominal tenderness in the right upper quadrant and epigastric area.      Comments: Mild   Neurological:      Mental Status: She is alert.     Past Medical History:   Diagnosis Date    Alcohol-induced acute pancreatitis     no longer drinks EtOH regularly    Class 1 obesity with body mass index (BMI) of 32.0 to 32.9 in adult     Gallstones     per pt report     Past Surgical History:   Procedure Laterality Date    EGD WITH ULTRASOUND EXAMINATION N/A 01/04/2022    Procedure: EGD, WITH ENDOSCOPIC ULTRASOUND AXIOS STENT INSERTION;  Surgeon: Golda Acre, MD    LAP CHOLECYSTECTOMY N/A 02/10/2021    Procedure: CHOLECYSTECTOMY, LAPAROSCOPIC, WITH CHOLANGIOGRAM;  Surgeon: Wynelle Fanny, MD     Family History   Problem Relation Age of Onset    No Known Problems Mother     Other Family History Father         died from trauma     Social History     Occupational History    Not on file   Tobacco Use    Smoking status: Every Day     Types: Cigarettes    Smokeless tobacco: Never   Substance and Sexual Activity    Alcohol use: Not on file    Drug use: Not on file    Sexual activity: Not on file     No outpatient medications have been marked as taking for the 02/10/22 encounter [facility] Encounter).     Allergies   Allergen Reactions    Droperidol neurological reaction     Pt states "face twitching and couldn't stop legs from moving"     Vital Signs:  Patient Vitals for the past 72 hrs:   Temp Heart Rate Resp BP BP Mean SpO2 Weight   02/10/22 1029 97.6 F (36.4 C) 96 17 143/74 97 MM HG 97 %  88.9 kg (196 lb)     Diagnostics:  Labs:  No results found for this  visit on 02/10/22.  ECG:  No results found for this visit on 02/10/22.    Medications ordered/given in the ED  Medications - No data to display      Electronically signed by Jannet Askew, MD at 02/10/2022 12:31 PM EST

## 2022-02-10 NOTE — ED Triage Notes (Signed)
Formatting of this note might be different from the original.  Patient here with mid abdominal pain, nausea and vomiting that started last night. Has a history of pancreatitis.  Electronically signed by Ladona Ridgel, RN at 02/10/2022 10:28 AM EST

## 2022-02-10 NOTE — ED Notes (Signed)
Formatting of this note is different from the original.     02/10/22 1128   Lab Draw   Lab Draw Time [place]   Lab Draw Site Left;Antecubital   Lab Draw Device Angiocatheter   Device Gauge 20   # of Lab Draw Attempts 1 Attempt   Labs Drawn with IV Start Yes   Lab Tubes [place]     Urine sample sent.   Electronically signed by Johnnette Barrios, RN at 02/10/2022 11:29 AM EST

## 2022-02-17 ENCOUNTER — Inpatient Hospital Stay
Admit: 2022-02-17 | Discharge: 2022-02-17 | Disposition: A | Discharge status: Home / Self Care | Payer: PRIVATE HEALTH INSURANCE | Attending: Emergency Medicine

## 2022-02-17 DIAGNOSIS — Z5329 Procedure and treatment not carried out because of patient's decision for other reasons: Secondary | ICD-10-CM

## 2022-02-17 DIAGNOSIS — R112 Nausea with vomiting, unspecified: Secondary | ICD-10-CM

## 2022-02-17 LAB — CBC WITH AUTO DIFFERENTIAL
Basophils: 0.2 % (ref 0–3)
Eosinophils: 1.6 % (ref 0–5)
Hematocrit: 31.9 % — ABNORMAL LOW (ref 35.0–47.0)
Hemoglobin: 11.2 gm/dl (ref 11.0–16.0)
Immature Granulocytes: 0.3 % (ref 0.0–3.0)
Lymphocytes: 35.9 % (ref 28–48)
MCH: 25.4 pg (ref 25.4–34.6)
MCHC: 35.1 gm/dl (ref 30.0–36.0)
MCV: 72.3 fL — ABNORMAL LOW (ref 80.0–98.0)
MPV: 10.6 fL — ABNORMAL HIGH (ref 6.0–10.0)
Monocytes: 6.4 % (ref 1–13)
Neutrophils Segmented: 55.6 % (ref 34–64)
Nucleated RBCs: 0 (ref 0–0)
Platelets: 209 10*3/uL (ref 140–450)
RBC: 4.41 M/uL (ref 3.60–5.20)
RDW: 42.2 (ref 36.4–46.3)
WBC: 6.3 10*3/uL (ref 4.0–11.0)

## 2022-02-17 LAB — COMPREHENSIVE METABOLIC PANEL
ALT: 14 U/L (ref 10–49)
AST: 30 U/L (ref 0.0–33.9)
Albumin: 4.3 gm/dl (ref 3.4–5.0)
Alkaline Phosphatase: 51 U/L (ref 46–116)
Anion Gap: 6 mmol/L (ref 5–15)
BUN: 9 mg/dl (ref 9–23)
CO2: 24 mEq/L (ref 20–31)
Calcium: 9.8 mg/dl (ref 8.7–10.4)
Chloride: 110 mEq/L — ABNORMAL HIGH (ref 98–107)
Creatinine: 0.66 mg/dl (ref 0.55–1.02)
GFR African American: >60
GFR Non-African American: >60
Glucose: 86 mg/dl (ref 74–106)
Potassium: 5.1 mEq/L (ref 3.5–5.1)
Sodium: 140 mEq/L (ref 136–145)
Total Bilirubin: 0.3 mg/dl (ref 0.30–1.20)
Total Protein: 8 gm/dl (ref 5.7–8.2)

## 2022-02-17 LAB — POC CHEM 8
BUN: 11 mg/dl (ref 7–25)
Calcium, Ionized: 4.1 mg/dL — ABNORMAL LOW (ref 4.40–5.40)
Chloride: 108 mEq/L — ABNORMAL HIGH (ref 98–107)
Creatinine: 0.5 mg/dl — ABNORMAL LOW (ref 0.6–1.3)
Glucose: 87 mg/dL (ref 74–106)
Hematocrit: 41 % (ref 38–45)
Hemoglobin: 13.9 gm/dl (ref 13.0–17.2)
Potassium: 5.9 mEq/L — ABNORMAL HIGH (ref 3.5–4.9)
Sodium: 139 mEq/L (ref 136–145)
Total CO2: 24 mmol/L (ref 21–32)

## 2022-02-17 LAB — LIPASE: Lipase: 28 U/L (ref 12–53)

## 2022-02-17 MED ORDER — ACETAMINOPHEN 10 MG/ML IV SOLN
10 MG/ML | Freq: Once | INTRAVENOUS | Status: DC
Start: 2022-02-17 — End: 2022-02-17

## 2022-02-17 MED ORDER — DICYCLOMINE HCL 10 MG/ML IM SOLN
10 MG/ML | INTRAMUSCULAR | Status: DC
Start: 2022-02-17 — End: 2022-02-17

## 2022-02-17 MED ORDER — SODIUM CHLORIDE 0.9 % IV BOLUS
0.9 % | Freq: Once | INTRAVENOUS | Status: DC
Start: 2022-02-17 — End: 2022-02-17

## 2022-02-17 MED ORDER — ONDANSETRON HCL 4 MG/2ML IJ SOLN
4 MG/2ML | Freq: Once | INTRAMUSCULAR | Status: AC
Start: 2022-02-17 — End: 2022-02-17
  Administered 2022-02-17: 19:00:00 4 mg via INTRAVENOUS

## 2022-02-17 MED FILL — ACETAMINOPHEN 10 MG/ML IV SOLN: 10 MG/ML | INTRAVENOUS | Qty: 100

## 2022-02-17 MED FILL — ONDANSETRON HCL 4 MG/2ML IJ SOLN: 4 MG/2ML | INTRAMUSCULAR | Qty: 2

## 2022-02-17 MED FILL — DICYCLOMINE HCL 10 MG/ML IM SOLN: 10 MG/ML | INTRAMUSCULAR | Qty: 2

## 2022-02-17 NOTE — ED Notes (Signed)
Pt ambulatory from triage to VC/SF with steady gait    Introduced self to pt.     Pt handed care card.        Barbaraann Barthel, LPN  37/85/88 [id number]

## 2022-02-17 NOTE — ED Notes (Signed)
[place] to medicate pt, pt on phone with mom stating that she is tired of going to hospitals and leaving feeling the same. Explained to pt that I had her medications and told pt what each med was for. Pt refused the meds stating that they don't help her. Asked pt if she was sure and she said yes.     1357 pt still on phone saying nothing was not offered anything for nausea, had zofran in hand and explained to pt that we could not give it without doing the ekg and the ekg was normal so she could have it now. Pt verbalized understanding     1405 pt walked into room 2 where this nurse was sitting asking if she was waiting on anything. Explained to pt she was waiting for lab results to come back. Pt stated she did not want to wait and wanted to leave. PA notified. IV was pulled out of pts arm, PA explained risks of leaving AMA. Pt verbalized understanding. This nurse asked pt if the nausea meds helped, pt stated they did make her feel better and expressed thanks for asking. Pt then shown how to get out of department.      Barbaraann Barthel, LPN  38/10/17 5102

## 2022-02-17 NOTE — ED Provider Notes (Signed)
[facility]  Emergency Department Treatment Report    Patient: Rhonda Hammond Age: 32 y.o. Sex: female    Date of Birth: October 09, 1990 Admit Date: 02/17/2022 PCP: Lisbeth Renshaw, MD   MRN: [id number]  CSN: [id number]  Attending: Susa Raring, MD   Room: H11/H11 Time Dictated: 6:04 PM APP: Dawayne Patricia, PA-C             Chief Complaint   Chief Complaint   Patient presents with    Abdominal Pain        History of Present Illness     32 y.o. female with a past medical history significant for pancreatitis, gastritis, cocaine and alcohol abuse, presents to the emergency department with complaints of 2-hour history of nausea vomiting with epigastric abdominal crampy pain.  Patient states that she was at work when she began to have 2 episodes of vomiting back-to-back.  Her coworkers did tell her to come to the emergency department for further evaluation.  Patient goes on no states that she had actually been feeling unwell for the last 2 to 3 days and this had been preceded by her drinking some alcohol.  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  She has been unable to find a gastroenterologist that will take her insurance.  She has been seen at multiple local ER facilities for the same.  She denies fever denies blood in her emesis or blood in her stool.  She has Zofran at home for nausea vomiting but she was unable to take as she was at work.      Review of Systems     Constitutional: No fever, chills, or weight loss  Eyes: No visual symptoms.  ENT: No sore throat, runny nose or ear pain.  Respiratory: No cough, dyspnea or wheezing.  Cardiovascular: No chest pain, pressure, palpitations, tightness or heaviness.  Gastrointestinal:  vomiting,  abdominal pain.  Genitourinary: No dysuria, frequency, or urgency.  Musculoskeletal: No joint pain or swelling.  Integumentary: No rashes.  Neurological: No headaches, sensory or motor symptoms.  Denies  complaints in all other systems.      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     Intractable nausea and vomiting 06/26/2020    Obesity 12/16/2018    Ovarian cyst     Pancreatitis      Past Surgical History:   Procedure Laterality Date    CESAREAN SECTION      CHOLECYSTECTOMY      UPPER GASTROINTESTINAL ENDOSCOPY N/A 10/12/2021    EGD DIAGNOSTIC performed by Sharman Crate, MD at [facility] History     Socioeconomic History    Marital status: Single   Tobacco Use    Smoking status: Every Day     Types: Cigars   Vaping Use    Vaping Use: Never used   Substance and Sexual Activity    Alcohol use: Yes     Alcohol/week: 21.0 standard drinks of alcohol     Types: 21 Shots of liquor per week     Comment: 2-3 drinks a day, sometimes more    Drug use: Yes  Frequency: 7.0 times per week     Types: Marijuana Sherrie Mustache)     Social Determinants of Health     Food Insecurity: No Food Insecurity (12/16/2021)    Hunger Vital Sign     Worried About Running Out of Food in the Last Year: Never true     Ran Out of Food in the Last Year: Never true   Transportation Needs: No Transportation Needs (12/16/2021)    PRAPARE - Armed forces logistics/support/administrative officer (Medical): No     Lack of Transportation (Non-Medical): No   Housing Stability: Low Risk  (12/16/2021)    Housing Stability Vital Sign     Unable to Pay for Housing in the Last Year: No     Number of Places Lived in the Last Year: 1     Unstable Housing in the Last Year: No       Family History     Family History   Problem Relation Age of Onset    No Known Problems Mother        Current Medications     Discharge Medication List as of 02/17/2022  2:25 PM        CONTINUE these medications which have NOT CHANGED    Details   ondansetron (ZOFRAN-ODT) 4 MG disintegrating  tablet Take 1 tablet by mouth 3 times daily as needed for Nausea or Vomiting, Disp-21 tablet, R-0Normal      dicyclomine (BENTYL) 20 MG tablet Take 1 tablet by mouth 4 times daily for 10 days, Disp-40 tablet, R-0Normal      famotidine (PEPCID) 20 MG tablet Take 1 tablet by mouth 2 times daily for 10 days, Disp-20 tablet, R-0Normal      sucralfate (CARAFATE) 1 GM tablet Take 1 tablet by mouth 4 times daily for 10 days, Disp-40 tablet, R-0Normal      pantoprazole (PROTONIX) 40 MG tablet Take 1 tablet by mouth 2 times daily (before meals), Disp-60 tablet, R-1Normal      albuterol sulfate HFA (PROVENTIL;VENTOLIN;PROAIR) 108 (90 Base) MCG/ACT inhaler Inhale 2 puffs into the lungs every 6 hours as needed for Wheezing, Disp-18 g, R-3Normal      albuterol (ACCUNEB) 0.63 MG/3ML nebulizer solution Inhale 0.63 mg into the lungs every 6 hours as neededHistorical Med               Allergies     Allergies   Allergen Reactions    Droperidol Other (See Comments)     Face twitching, drooling and spontaneous leg movement       Physical Exam     ED Triage Vitals [02/17/22 1230]   Enc Vitals Group      BP 136/82      Pulse 95      Respirations 22      Temp 97.7 F (36.5 C)      Temp Source Oral      SpO2 97 %      Weight - Scale 86.6 kg (191 lb)      Height 1.651 m (5\' 5" )       Constitutional: Patient appears well developed and well nourished.  Appearance and behavior are age and situation appropriate.  HEENT: Conjunctiva clear.  PERRL. . Mucous membranes moist, non-erythematous. Surface of the pharynx, palate, and tongue are pink, moist and without lesions.  Neck: supple, non tender, symmetrical, no masses or JVD.   Respiratory: lungs clear to auscultation, nonlabored respirations. No tachypnea or accessory muscle use.  Cardiovascular:  heart regular rate and rhythm without murmur rubs or gallops.  Distal pulses 2+ and equal bilaterally.  No peripheral edema.  Gastrointestinal:  Abdomen soft, minimally tender with complaint of pain  to palpation epigastrium.  No peritoneal findings.   Musculoskeletal: Ext: Warm/well perfused, DP/PT/radial pulses palpable bilaterally, no bony tenderness, no obvious deformities or lesions.  Integumentary: warm and dry without rashes or lesions  Neurologic: alert and oriented,        Impression and Management Plan     32 year old afebrile nontoxic-appearing female who presents emergency department with complaints of epigastric abdominal pain nausea vomiting.      Differential Diagnosis: Pancreatitis, gastritis, gastroparesis, ileus, functional GI disorder, less likely UTI, other biliary tree pathology, cannabis hyperemesis syndrome, other intraabdominal infection      At this time will perform basic labs to rule out acute kidney injury electrolyte abnormalities will give antiemetics after ensuring EKG without any prolongation and QT interval as she requires antiemetics frequently will give dicyclomine acetaminophen antiemetic            Diagnostic Studies     Lab:   Recent Results (from the past 12 hour(s))   CBC with Diff    Collection Time: 02/17/22  1:30 PM   Result Value Ref Range    WBC 6.3 4.0 - 11.0 1000/mm3    RBC 4.41 3.60 - 5.20 M/uL    Hemoglobin 11.2 11.0 - 16.0 gm/dl    Hematocrit 79.0 (L) 35.0 - 47.0 %    MCV 72.3 (L) 80.0 - 98.0 fL    MCH 25.4 25.4 - 34.6 pg    MCHC 35.1 30.0 - 36.0 gm/dl    Platelets 240 973 - 450 1000/mm3    MPV 10.6 (H) 6.0 - 10.0 fL    RDW 42.2 36.4 - 46.3      Nucleated RBCs 0 0 - 0      Immature Granulocytes 0.3 0.0 - 3.0 %    Neutrophils Segmented 55.6 34 - 64 %    Lymphocytes 35.9 28 - 48 %    Monocytes 6.4 1 - 13 %    Eosinophils 1.6 0 - 5 %    Basophils 0.2 0 - 3 %   CMP    Collection Time: 02/17/22  1:30 PM   Result Value Ref Range    Potassium 5.1 3.5 - 5.1 mEq/L    Chloride 110 (H) 98 - 107 mEq/L    Sodium 140 136 - 145 mEq/L    CO2 24 20 - 31 mEq/L    Glucose 86 74 - 106 mg/dl    BUN 9 9 - 23 mg/dl    Creatinine 5.32 9.92 - 1.02 mg/dl    GFR African American >60.0       GFR Non-African American >60      Calcium 9.8 8.7 - 10.4 mg/dl    Anion Gap 6 5 - 15 mmol/L    AST 30.0 0.0 - 33.9 U/L    ALT 14 10 - 49 U/L    Alkaline Phosphatase 51 46 - 116 U/L    Total Bilirubin 0.30 0.30 - 1.20 mg/dl    Total Protein 8.0 5.7 - 8.2 gm/dl    Albumin 4.3 3.4 - 5.0 gm/dl   Lipase    Collection Time: 02/17/22  1:30 PM   Result Value Ref Range    Lipase 28 12 - 53 U/L   POC CHEM 8    Collection Time: 02/17/22  1:38 PM  Result Value Ref Range    Sodium 139 136 - 145 mEq/L    Potassium 5.9 (H) 3.5 - 4.9 mEq/L    Chloride 108 (H) 98 - 107 mEq/L    Total CO2 24 21 - 32 mmol/L    Glucose 87 74 - 106 mg/dL    BUN 11 7 - 25 mg/dl    Creatinine 0.5 (L) 0.6 - 1.3 mg/dl    Hematocrit 41 38 - 45 %    Hemoglobin 13.9 13.0 - 17.2 gm/dl    Calcium, Ionized 5.184.10 (L) 4.40 - 5.40 mg/dL       Imaging:    No orders to display         Signed By: Madelynn Doneracie R Rehfuss, MD on 01/22/2022 11:37 PM  Narrative    EXAM: CT ABD/PELVIS-IV ONLY     CLINICAL INDICATION/HISTORY: Abdominal pain, post-op, presents with nausea and vomiting, history of pancreatitis status post stent placement     COMPARISON: CT A/P 01/14/2022     TECHNIQUE:  CT abdomen and pelvis with IV contrast.   All CT scans at this facility are performed using dose optimization technique as appropriate to the performed examination, to include automated exposure control, adjustment of the mA and/or kV according to patient's size (including appropriate matching for site-specific examinations), or use of an iterative reconstruction technique.     FINDINGS:   Lower chest: Negative.     Liver: Negative.     Biliary: Gallbladder surgically absent. AXIOS stent in place between the pancreatic head and gastric pylorus, as before.     Pancreas: Negative.     Spleen: Negative.     Adrenal glands: Negative.     Kidneys: Negative.     Stomach, Small Bowel and Colon: Small bowel and colon without evidence of obstruction. Appendix is normal.     Pelvic Organs: Similar left ovarian  follicle measuring up to 2.5 cm. Right ovarian lesion measures 3.2 cm, new since prior exam.. Density is greater than water     Bladder: Negative.     Lymph nodes: No lymphadenopathy.     Vessels: Unremarkable for age.     Peritoneal Spaces: No free fluid or free air.     Body wall: Negative.     Bones: No acute or aggressive osseous abnormality. Degenerative changes.  Procedure Note    Rehfuss, Susa Simmondsracie R, MD - 01/22/2022   Formatting of this note might be different from the original.   EXAM: CT ABD/PELVIS-IV ONLY     CLINICAL INDICATION/HISTORY: Abdominal pain, post-op, presents with nausea and vomiting, history of pancreatitis status post stent placement     COMPARISON: CT A/P 01/14/2022     TECHNIQUE:  CT abdomen and pelvis with IV contrast.   All CT scans at this facility are performed using dose optimization technique as appropriate to the performed examination, to include automated exposure control, adjustment of the mA and/or kV according to patient's size (including appropriate matching for site-specific examinations), or use of an iterative reconstruction technique.     FINDINGS:   Lower chest: Negative.     Liver: Negative.     Biliary: Gallbladder surgically absent. AXIOS stent in place between the pancreatic head and gastric pylorus, as before.     Pancreas: Negative.     Spleen: Negative.     Adrenal glands: Negative.     Kidneys: Negative.     Stomach, Small Bowel and Colon: Small bowel and colon without evidence of obstruction. Appendix is normal.  Pelvic Organs: Similar left ovarian follicle measuring up to 2.5 cm. Right ovarian lesion measures 3.2 cm, new since prior exam.. Density is greater than water     Bladder: Negative.     Lymph nodes: No lymphadenopathy.     Vessels: Unremarkable for age.     Peritoneal Spaces: No free fluid or free air.     Body wall: Negative.     Bones: No acute or aggressive osseous abnormality. Degenerative changes.       IMPRESSION   1. No acute abnormality of the  abdomen or pelvis.   2. Unchanged appearance of AXIOS stent.   3. New 3.2 cm right adnexal lesion, likely hemorrhagic cyst.     Dictated ByUlysees Barns on 01/22/2022 11:09 PM     As attending radiologist, I have assessed the study images, and dictated or reviewed/edited the final report as needed.     Signed By: Madelynn Done, MD on 01/22/2022 11:37 PM    EKG: Normal sinus rhythm with a rate of 77 bpm PR interval 140 ms QRS duration 76 ms QT interval 368 ms with QTc of 460 ms without ST-T wave elevation as read by myself under direct supervision Dr. Gerrit Friends    ED Course/ Medical Decision Making      Medications   ondansetron [facility]) injection 4 mg (4 mg IntraVENous Given 02/17/22 1357)       ED Course as of 02/17/22 1804   Fri Feb 17, 2022   1348 Patient refused IM Bentyl, IV fluids and Offirmev.  [AJ]      ED Course User Index  [AJ] Shireen Quan, PA      Patient ordered Bentyl Zofran Offerman of for pain.  She declined her IV fluids her Bentyl and her Offerman of.  She did except her Zofran that was ordered after EKG revealed no prolongation of her QT interval.  She had POC chemistry that did show an elevated potassium however on regular chemistry it did redemonstrate itself to be within normal limits.  She did not have any elevation in her transaminases or her lipase to suggest an acute returning pancreatitis.  She did have a white blood cell count that was normalized at 6.3 hemoglobin 11.2 and hematocrit 31.9.  Patient did desire to leave prior to her formal return of labs.  She had episodes of crying she called someone on her cell phone.  I did discuss the risk benefits of not having complete workup she states that she never feels any better after the medications given to her here in the ED and she goes home and feels the same way.  I also did discuss need for more permanent follow-up including gastroenterology.  Patient does understand she is to return      RECORDS REVIEWED:  I reviewed the  patient's previous records here at [facility] and available outside facilities and note that evaluated in this emergency department February 01, 2022 for abdominal pain    EXTERNAL RESULTS REVIEWED: Patient evaluated 0126 2024 for abdominal pain [facility] [address] [facility] emergency department with most recent imaging CT of the abdomen pelvis performed 01/22/2022    INDEPENDENT HISTORIAN:  History and/or plan development assisted by: Patient    Severe exacerbation or progression of chronic illness: Abdominal pain    Threat to body function without evaluation and management: Gastrointestinal    SOCIAL DETERMINANTS  impacting Evaluation and Management: Access to care      Comorbidities impacting Evaluation and Management: Chronic  abdominal pain      Critical Care Time (if necessary)         I am the first provider for this patient.     I reviewed the vital signs, available nursing notes, past medical history, past surgical history, family history and social history.      I have spoken to Jaynie Collins, MD regarding this patient's care and we discussed past medical history, physical exam, ER course, labs,      Final Diagnosis     1. Left against medical advice    2. Chronic abdominal pain    3. Acute abdominal pain    4. Nausea and vomiting, unspecified vomiting type          Disposition          Medication List        ASK your doctor about these medications      * albuterol 0.63 MG/3ML nebulizer solution  Commonly known as: ACCUNEB     * albuterol sulfate HFA 108 (90 Base) MCG/ACT inhaler  Commonly known as: PROVENTIL;VENTOLIN;PROAIR  Inhale 2 puffs into the lungs every 6 hours as needed for Wheezing     dicyclomine 20 MG tablet  Commonly known as: BENTYL  Take 1 tablet by mouth 4 times daily for 10 days     famotidine 20 MG tablet  Commonly known as: Pepcid  Take 1 tablet by mouth 2 times daily for 10 days     ondansetron 4 MG disintegrating tablet  Commonly known as: ZOFRAN-ODT  Take 1 tablet by mouth 3 times daily as  needed for Nausea or Vomiting     pantoprazole 40 MG tablet  Commonly known as: PROTONIX  Take 1 tablet by mouth 2 times daily (before meals)     sucralfate 1 GM tablet  Commonly known as: Carafate  Take 1 tablet by mouth 4 times daily for 10 days           * This list has 2 medication(s) that are the same as other medications prescribed for you. Read the directions carefully, and ask your doctor or other care provider to review them with you.                     Patient left against medical advice    The patient has decided to leave against medical advice, because she no longer wanted to wait for results of labs .    She has normal mental status and adequate capacity to make medical decisions.    The patient refuses hospital admission and wants to be discharged.    The risks have been explained to the patient, including worsening illness, chronic pain, permanent disability, and death.    The benefits of admission have also been explained, including the availability and proximity of nurses, physicians, monitoring, diagnostic testing, and treatment.    The patient was able to understand and state the risks and benefits of hospital admission.  This was witnessed by nurse RN Atha Starks and me.    She had the opportunity to ask questions about her medical condition.    The patient was treated to the extent that she would allow, and knows that she may return for care at any time.      Rita Ohara  February 17, 2022        The patient was personally evaluated by myself and Jaynie Collins, MD who agrees with the above assessment and  plan.    My signature above authenticates this document and my orders, the final    diagnosis (es), discharge prescription (s), and instructions in the Epic    record.  If you have any questions please contact [phone].     Nursing notes have been reviewed by the physician/ advanced practice    Clinician.  Dragon medical dictation software was used for portions of this  report. Unintended voice recognition errors may occur.                       Dawayne Patricia, [place]  02/17/22 [phone]

## 2022-02-17 NOTE — ED Triage Notes (Signed)
Pt presents to ED with c/o abd pain and vomiting that began 2 hours ago.

## 2022-02-18 LAB — EKG 12-LEAD
Atrial Rate: 77 {beats}/min
Calculated P Axis: 37 degrees
Calculated R Axis: 74 degrees
Calculated T Axis: 3 degrees
DIAGNOSIS, 93000: NORMAL
P-R Interval: 140 ms
Q-T Interval: 368 ms
QRS Duration: 76 ms
QTC Calculation (Bezet): 416 ms
Ventricular Rate: 77 {beats}/min

## 2022-04-05 NOTE — ED Provider Notes (Signed)
[facility]    Time of Arrival: 04/05/22 0709     (K86.1) Chronic pancreatitis, unspecified pancreatitis type (HCC) - Plan: oxyCODONE (ROXICODONE) 5 mg PO TABS    Assessment: Acute exacerbation of chronic pancreatitis    Disposition:  Home    Differential diagnosis: Severe dehydration, renal failure, biliary pathology    ED Course/Medical Decision Making:  Rhonda Hammond is a 32 y.o. female who presents complaining of ABDOMINAL PAIN and VOMITING  The patient is well and nontoxic appearing on exam, vitals are significant for some tachycardia initially but otherwise stable and within normal limits.  Abdomen is tender really just in the left upper quadrant and epigastric area, none in the right upper quadrant, none of the right lower quadrant, considered CT scan for rule out of appendicitis or biliary pathology but I think this is not necessary given physical exam findings.  CBC and CMP unremarkable, lipase is not significantly elevated, not surprising given her chronicity of her symptoms.  She is given a couple doses of Dilaudid and Zofran in addition to some fluids and feels a lot better, vital signs normalized, reassuring overall.  The patient is being discharged from the Emergency Department with instructions to follow-up with their primary care provider in the next 1-3 days. The patient is given appropriate return precautions including new or worsening symptoms. The above was discussed with the patient, who expressed understanding, and is in agreement with this plan.        Discussion of Mangement with other Physicians, QHP or Appropriate Source:  None    New Prescriptions    ACETAMINOPHEN (TYLENOL) 500 MG PO TABS    Take 2 Tabs by Mouth Every 8 Hours As Needed for up to 10 days.    NAPROXEN (NAPROSYN) 500 MG PO TABS    Take 1 Tab by Mouth Twice Daily for 7 days.    ONDANSETRON (ZOFRAN) 4 MG PO ODT.    Take 1 Tab by Mouth Every 8 Hours As Needed for Nausea (PRN FOR NAUSEA AND  VOMITING). ALLOW TABLET TO DISSOLVE ON TONGUE.    OXYCODONE (ROXICODONE) 5 MG PO TABS    Take 1 Tab by Mouth Every 6 Hours As Needed.         Social Determinants of Health:   Social factors reviewed, did not limit treatment      Supplemental Historians include:   Patient      History of Present Illness:  This is a 32 y.o. female who presents complaining of ABDOMINAL PAIN and VOMITING  For the past couple of days, feels like her chronic pancreatitis, denies chest pain or shortness of breath, denies trauma.  Has been trying medications at home without much improvement.    Physical Exam  Constitutional:       General: She is not in acute distress.  HENT:      Head: Normocephalic and atraumatic.   Cardiovascular:      Rate and Rhythm: Normal rate.   Pulmonary:      Effort: Pulmonary effort is normal. No respiratory distress.      Breath sounds: No stridor.   Abdominal:      General: There is no distension.      Palpations: Abdomen is soft.      Tenderness: There is abdominal tenderness (Left upper quadrant).   Musculoskeletal:         General: No deformity.   Skin:     General: Skin is warm and dry.  Findings: No rash.   Neurological:      Mental Status: She is alert and oriented to person, place, and time.          Documentation Review: Discharge summaries 03/23/2022 regarding alcoholic pancreatitis    Vital Signs:  Patient Vitals for the past 24 hrs:   BP Temp Pulse Resp SpO2 Height Weight   04/05/22 0900 98/52 -- 51 16 100 % -- --   04/05/22 0800 108/47 -- 73 16 99 % -- --   04/05/22 0748 112/47 98.6 F (37 C) 62 16 100 % -- --   04/05/22 0712 115/68 -- -- 14 95 % 5\' 5"  (1.651 m) 94.3 kg (208 lb)       Diagnostics:  Labs:   Results for orders placed or performed during the hospital encounter of 04/05/22   COMPREHENSIVE METABOLIC PANEL   Result Value Ref Range    Potassium 4.5 3.5 - 5.5 mmol/L    Sodium 137 133 - 145 mmol/L    Chloride 101 98 - 110 mmol/L    Glucose 93 70 - 99 mg/dL    Calcium 9.2 8.4 - 91.4 mg/dL     Albumin 4.2 3.5 - 5.0 g/dL    SGPT (ALT) 8 5 - 40 U/L    SGOT (AST) 16 10 - 37 U/L    Bilirubin Total 0.3 0.2 - 1.2 mg/dL    Alkaline Phosphatase 69 25 - 115 U/L    BUN 11 6 - 22 mg/dL    CO2 22 20 - 32 mmol/L    Creatinine 0.6 0.5 - 1.2 mg/dL    eGFR >78.2 >95.6 OZ/HYQ/6.57 sq.m.    Globulin 2.9 2.0 - 4.0 g/dL    A/G Ratio 1.4 1.1 - 2.6 ratio    Total Protein 7.1 6.4 - 8.3 g/dL    Anion Gap 84.6 3.0 - 15.0 mmol/L   LIPASE   Result Value Ref Range    Lipase 29 7 - 60 U/L   CBC WITH DIFFERENTIAL AUTO   Result Value Ref Range    WBC 7.6 4.0 - 11.0 K/uL    RBC 4.26 3.80 - 5.20 M/uL    HGB 11.7 11.7 - 15.5 g/dL    HCT 96.2 (L) 95.2 - 46.5 %    MCV 76 (L) 80 - 99 fL    MCH 28 26 - 34 pg    MCHC 36 31 - 36 g/dL    RDW 84.1 32.4 - 40.1 %    Platelet 230 140 - 440 K/uL    MPV 10.6 9.0 - 13.0 fL    Segmented Neutrophils (Auto) 62 40 - 75 %    Lymphocytes (Auto) 27 20 - 45 %    Monocytes (Auto) 8 3 - 12 %    Eosinophils (Auto) 4 0 - 6 %    Basophils (Auto) 0 0 - 2 %    Absolute Neutrophils (Auto) 4.7 1.8 - 7.7 K/uL    Absolute Lymphocytes (Auto) 2.0 1.0 - 4.8 K/uL    Absolute Monocytes (Auto) 0.6 0.1 - 1.0 K/uL    Absolute Eosinophils (Auto) 0.3 0.0 - 0.5 K/uL    Absolute Basophils (Auto) 0.0 0.0 - 0.2 K/uL     ECG: No results found for this visit on 04/05/22.    Imaging interpreted by me:  None by my read    Subsequent radiology interpretation when available:  No orders to display       My rhythm interpretation from monitor:  None    Disclaimer:  The above was dictated using a computer transcription program.  Although proofread, it may contain computer transcription errors or phonetic errors.  Other human proofreading errors may exist.  Corrections may be performed at a later time.  Please contact for any clarification if needed.

## 2022-04-12 NOTE — ED Notes (Signed)
Formatting of this note might be different from the original.  Notified by lab CBC hemolyzed   Electronically signed by Dannial Monarch, RN at 04/12/2022  8:17 PM EDT

## 2022-04-12 NOTE — ED Notes (Signed)
Formatting of this note might be different from the original.  CBC recollected at this time and walked to ed stat lab  Electronically signed by Dannial Monarch, RN at 04/12/2022  8:51 PM EDT

## 2022-04-12 NOTE — ED Triage Notes (Signed)
Formatting of this note might be different from the original.  Pt arrived by [facility]  C/o abdomen pain, nausea and vomiting x 2 days  Reports hx pancreatitis and concerned having a flare up  Rates pain as a 10 on scale 0-10  Electronically signed by Isabelle Course, RN at 04/12/2022  7:30 PM EDT

## 2022-04-12 NOTE — ED Notes (Signed)
Formatting of this note is different from the original.     04/12/22 2037   Pain   (0-10) Pain Rating: Rest 7   Pain Location abdomen       Electronically signed by Dannial Monarch, RN at 04/12/2022  8:38 PM EDT

## 2022-04-12 NOTE — ED Notes (Signed)
Formatting of this note might be different from the original.  Pain assessment on discharge was 6.  Condition stable.  Patient discharged to home.  Patient education was completed:  yes  Education taught to:  patient  Teaching method used was discussion and handout.  Understanding of teaching was good.  Patient was discharged ambulatory.  Discharged with self.  Valuables were given to: patient.    Electronically signed by Dannial Monarch, RN at 04/12/2022 11:36 PM EDT

## 2022-04-12 NOTE — ED Notes (Signed)
Formatting of this note might be different from the original.  Pt nausea improved PO challenged passed MD Albright notified   Electronically signed by Dannial Monarch, RN at 04/12/2022  8:54 PM EDT

## 2022-04-12 NOTE — ED Notes (Signed)
Formatting of this note might be different from the original.  Pt medicated at this time per the mar   All allergies verified with pt/family   Ed swallow screen passed   Pt educated on medications administered  Safety and comfort measures in place      Electronically signed by Dannial Monarch, RN at 04/12/2022 10:13 PM EDT

## 2022-04-12 NOTE — ED Notes (Signed)
Formatting of this note might be different from the original.  Pt ambulatory to CT with CT tech  Electronically signed by Darrol Angel at 04/12/2022  9:36 PM EDT

## 2022-04-12 NOTE — ED Notes (Signed)
Formatting of this note might be different from the original.  Pt back from CT  Electronically signed by Darrol Angel at 04/12/2022  9:55 PM EDT

## 2022-04-13 NOTE — ED Triage Notes (Signed)
Formatting of this note might be different from the original.  Arrived by [employer] [facility]. C/o abdominal pain with nausea/vomiting x 3 days.   Reports history of pancreatitis. Pain 10/10.    Electronically signed by Pierce Crane, RN at 04/13/2022 11:54 PM EDT

## 2022-04-13 NOTE — ED Triage Notes (Signed)
Arrived by [facility]. C/o abdominal pain with nausea/vomiting x 3 days.   Reports history of pancreatitis. Pain 10/10.

## 2022-04-14 NOTE — ED Notes (Signed)
Formatting of this note might be different from the original.  Pain assessment on discharge was tolerable.  Condition stable.  Patient discharged to home.  Patient education was completed:  yes  Education taught to:  patient  Teaching method used was discussion.  Understanding of teaching was good.  Patient was discharged ambulatory.  Discharged with self.  Valuables were given to: patient.   Electronically signed by Rhys Martini, RN at 04/14/2022  2:22 AM EDT

## 2022-04-14 NOTE — ED Notes (Signed)
Formatting of this note might be different from the original.  Patient refusing d/c paperwork, stating "I've got all of that already". Patient reporting increased pain. Dr. Mathews Robinsons ordered additional medications prior to d/c. Patient medicated per MAR. Taxi called for patient.    Condition stable.  Patient discharged to home.  Patient education was completed:  yes  Education taught to:  patient  Teaching method used was discussion.  Understanding of teaching was fair.  Patient was discharged ambulatory.  Discharged with self.  Valuables were given to: patient.    Electronically signed by Berna Spare, RN at 04/14/2022  1:22 PM EDT

## 2022-04-14 NOTE — ED Triage Notes (Signed)
Formatting of this note might be different from the original.  Epigastric pain with nausea and vomiting x4 days. Seen here for same last night.   Electronically signed by Melbourne Abts, RN at 04/14/2022 10:46 AM EDT

## 2022-04-14 NOTE — ED Notes (Signed)
Formatting of this note might be different from the original.  Called lab- said they had blood and would be running shortly     Electronically signed by Martie Round, RN at 04/14/2022 12:15 PM EDT

## 2022-04-14 NOTE — ED Provider Notes (Signed)
Formatting of this note is different from the original.    [facility]    Time of Arrival:   04/14/22 1042    Final diagnoses:   [R10.13] Epigastric pain (Primary)   [R11.2] Nausea and vomiting, unspecified vomiting type     Medical Decision Making:      Differential/Questionable Diagnosis:       Dehydration, AKI, infection, pancreatitis, intra-abdominal infection, etc.    Supplemental Historians include:  Rhonda Hammond    ED Course:     Rhonda Hammond well-appearing, nontoxic.  She is notable for tachycardia to 137, other vital signs stable.    Epigastric tenderness on exam but otherwise she looks well and has not had any vomiting here.    CT from 3/27:  1. No acute abnormalities of the abdomen or pelvis.   2. Unchanged appearance of AXIOS stent.     Recent CT scan a few days ago was reassuring.  Rhonda Hammond Dors no change in her symptoms from then.  CBC without leukocytosis, chronic anemia with a hemoglobin of 11.3.  BMP within normal limits, no AKI or electrolyte issues.  Lipase is reassuring, Rhonda Hammond has states she has not drank, suspicion is low for pancreatitis.    Rhonda Hammond given 1 L of fluids, Mylanta, morphine, Pepcid and Zofran as well as a dose of Protonix.  Workup appears reassuring.  Emphasized the importance of taking her Pepcid, Protonix, Zofran at home. Sx likely chronic abdominal pain, gastritis/gerd vs malingering for narcotics given frequent visits for such to ER.    All questions answered on examination. Rhonda Hammond agreeable for discharge at this time. I discussed with them all indications to return to the ER, and they agreeable with the plan discussed.     Documentation/Prior Results Review:    Old medical records:       Rhythm interpretation from monitor: N/A    Imaging Interpreted by me: Not Applicable    No orders to display     .     Discussion of Management with other Physicians, QHP or Appropriate Source:   None    .    Disposition:  Home    New Prescriptions    No medications  on file     Chief Complaint   Rhonda Hammond presents with    EPIGASTRIC PAIN    VOMITING    NAUSEA     HPI   Rhonda Hammond is a 32 year old female with a past medical history of pancreatitis, Axios stent placement, cholecystectomy, alcohol use, presenting to the emergency department for continued epigastric abdominal pain, nausea, vomiting.  Vomited 4 times this morning.  No diarrhea, dysuria, injury to conceive, vaginal bleeding, discharge, chest pain, shortness of breath, fevers, cough, sore throat.  Attempted Zofran without much relief.  Same symptoms as being seen yesterday.      Physical Exam  Constitutional:       Appearance: Normal appearance.   HENT:      Head: Normocephalic and atraumatic.   Cardiovascular:      Rate and Rhythm: Normal rate and regular rhythm.      Pulses: Normal pulses.      Heart sounds: Normal heart sounds. No murmur heard.     No friction rub. No gallop.   Pulmonary:      Effort: Pulmonary effort is normal. No respiratory distress.      Breath sounds: Normal breath sounds. No wheezing or rales.   Abdominal:      General: Abdomen is flat. There is  no distension.      Palpations: Abdomen is soft.      Tenderness: There is abdominal tenderness. There is no guarding.      Comments: Epigastric pain   Musculoskeletal:         General: Normal range of motion.      Cervical back: Normal range of motion.   Skin:     General: Skin is warm.      Capillary Refill: Capillary refill takes less than 2 seconds.   Neurological:      Mental Status: She is alert and oriented to person, place, and time.   Psychiatric:         Mood and Affect: Mood normal.     Past Medical History:   Diagnosis Date    Alcohol-induced acute pancreatitis     no longer drinks EtOH regularly    Class 1 obesity with body mass index (BMI) of 32.0 to 32.9 in adult     Gallstones     per pt report     Past Surgical History:   Procedure Laterality Date    EGD WITH ULTRASOUND EXAMINATION N/A 01/04/2022    Procedure: EGD, WITH ENDOSCOPIC  ULTRASOUND AXIOS STENT INSERTION;  Surgeon: Henrietta Dine, MD    LAP CHOLECYSTECTOMY N/A 02/10/2021    Procedure: CHOLECYSTECTOMY, LAPAROSCOPIC, WITH CHOLANGIOGRAM;  Surgeon: Wandra Scot, MD     Family History   Problem Relation Age of Onset    No Known Problems Mother     Other Family History Father         died from trauma     Social History     Occupational History    Not on file   Tobacco Use    Smoking status: Every Day     Types: Cigarettes    Smokeless tobacco: Never   Substance and Sexual Activity    Alcohol use: Not on file    Drug use: Not on file    Sexual activity: Not on file     No outpatient medications have been marked as taking for the 04/14/22 encounter [facility] Encounter).     Allergies   Allergen Reactions    Droperidol neurological reaction     Pt states "face twitching and couldn't stop legs from moving"     Vital Signs:  Rhonda Hammond Vitals for the past 72 hrs:   Temp Heart Rate Resp BP BP Mean SpO2 Weight   04/14/22 1044 97.5 F (36.4 C) 137 16 162/97 (!) 119 MM HG 99 % 92.5 kg (204 lb)     Diagnostics:  Labs:  No results found for this visit on 04/14/22.  ECG:  No results found for this visit on 04/14/22.      Medications ordered/given in the ED  Medications   Lactated Ringers (LR) infusion (has no administration in time range)   ondansetron (PF) (Zofran) injection 4 mg (has no administration in time range)   morphine injection 4 mg (has no administration in time range)   famotidine (PF) (Pepcid) injection 20 mg (has no administration in time range)   Alum-Mag Hydroxide-Simeth DS (Mylanta DS) 400-400-40 mg/5 mL suspension 15 mL (has no administration in time range)       Electronically signed by Mosetta Pigeon, MD at 04/16/2022  7:24 PM EDT

## 2022-04-15 ENCOUNTER — Inpatient Hospital Stay
Admit: 2022-04-15 | Discharge: 2022-04-15 | Disposition: A | Discharge status: Home / Self Care | Payer: PRIVATE HEALTH INSURANCE | Attending: Student in an Organized Health Care Education/Training Program

## 2022-04-15 DIAGNOSIS — K2920 Alcoholic gastritis without bleeding: Secondary | ICD-10-CM

## 2022-04-15 DIAGNOSIS — K860 Alcohol-induced chronic pancreatitis: Secondary | ICD-10-CM

## 2022-04-15 LAB — CBC WITH AUTO DIFFERENTIAL
Basophils: 0.2 % (ref 0–3)
Eosinophils: 2 % (ref 0–5)
Hematocrit: 32.6 % — ABNORMAL LOW (ref 35.0–47.0)
Hemoglobin: 11.5 gm/dl (ref 11.0–16.0)
Immature Granulocytes: 0.4 % (ref 0.0–3.0)
Lymphocytes: 30.2 % (ref 28–48)
MCH: 27.6 pg (ref 25.4–34.6)
MCHC: 35.3 gm/dl (ref 30.0–36.0)
MCV: 78.2 fL — ABNORMAL LOW (ref 80.0–98.0)
MPV: 10.9 fL — ABNORMAL HIGH (ref 6.0–10.0)
Monocytes: 8.3 % (ref 1–13)
Neutrophils Segmented: 58.9 % (ref 34–64)
Nucleated RBCs: 0 (ref 0–0)
Platelets: 207 10*3/uL (ref 140–450)
RBC: 4.17 M/uL (ref 3.60–5.20)
RDW: 41.1 (ref 36.4–46.3)
WBC: 8.2 10*3/uL (ref 4.0–11.0)

## 2022-04-15 LAB — COMPREHENSIVE METABOLIC PANEL
ALT: 8 U/L — ABNORMAL LOW (ref 10–49)
AST: 23 U/L (ref 0.0–33.9)
Albumin: 4.2 gm/dl (ref 3.4–5.0)
Alkaline Phosphatase: 68 U/L (ref 46–116)
Anion Gap: 9 mmol/L (ref 5–15)
BUN: 6 mg/dl — ABNORMAL LOW (ref 9–23)
CO2: 21 mEq/L (ref 20–31)
Calcium: 9.8 mg/dl (ref 8.7–10.4)
Chloride: 110 mEq/L — ABNORMAL HIGH (ref 98–107)
Creatinine: 0.69 mg/dl (ref 0.55–1.02)
GFR African American: >60
GFR Non-African American: >60
Glucose: 99 mg/dl (ref 74–106)
Potassium: 4.3 mEq/L (ref 3.5–5.1)
Sodium: 140 mEq/L (ref 136–145)
Total Bilirubin: 0.4 mg/dl (ref 0.30–1.20)
Total Protein: 8.1 gm/dl (ref 5.7–8.2)

## 2022-04-15 LAB — LIPASE: Lipase: 27 U/L (ref 12–53)

## 2022-04-15 LAB — ETHANOL: Ethanol Lvl: NEGATIVE mg/dl (ref 0.0–3.0)

## 2022-04-15 MED ORDER — ONDANSETRON 4 MG PO TBDP
4 MG | ORAL_TABLET | Freq: Three times a day (TID) | ORAL | 0 refills | Status: AC | PRN
Start: 2022-04-15 — End: 2023-02-07

## 2022-04-15 MED ORDER — SODIUM CHLORIDE (PF) 0.9 % IJ SOLN
0.9 | INTRAMUSCULAR | Status: AC
Start: 2022-04-15 — End: 2022-04-15
  Administered 2022-04-15: 11:00:00 20 mg via INTRAVENOUS

## 2022-04-15 MED ORDER — SODIUM CHLORIDE 0.9 % IV BOLUS
0.9 | Freq: Once | INTRAVENOUS | Status: AC
Start: 2022-04-15 — End: 2022-04-15
  Administered 2022-04-15: 11:00:00 1000 mL via INTRAVENOUS

## 2022-04-15 MED ORDER — PROMETHAZINE HCL 25 MG PO TABS
25 | ORAL_TABLET | Freq: Four times a day (QID) | ORAL | 0 refills | Status: AC | PRN
Start: 2022-04-15 — End: 2022-04-22

## 2022-04-15 MED ORDER — MORPHINE SULFATE 4 MG/ML IJ SOLN
4 | INTRAMUSCULAR | Status: AC
Start: 2022-04-15 — End: 2022-04-15
  Administered 2022-04-15: 11:00:00 4 mg via INTRAVENOUS

## 2022-04-15 MED ORDER — HYDROMORPHONE HCL 1 MG/ML IJ SOLN
1 | INTRAMUSCULAR | Status: AC
Start: 2022-04-15 — End: 2022-04-15
  Administered 2022-04-15: 12:00:00 1 mg via INTRAVENOUS

## 2022-04-15 MED ORDER — HYDROMORPHONE HCL 1 MG/ML IJ SOLN
1 | INTRAMUSCULAR | Status: AC
Start: 2022-04-15 — End: 2022-04-15
  Administered 2022-04-15: 14:00:00 1 mg via INTRAVENOUS

## 2022-04-15 MED ORDER — SUCRALFATE 1 G PO TABS
1 | ORAL_TABLET | Freq: Four times a day (QID) | ORAL | 0 refills | Status: AC
Start: 2022-04-15 — End: 2022-04-29

## 2022-04-15 MED ORDER — AMOXICILLIN-POT CLAVULANATE 875-125 MG PO TABS
875-125 | ORAL | Status: AC
Start: 2022-04-15 — End: 2022-04-15
  Administered 2022-04-15: 14:00:00 1 via ORAL

## 2022-04-15 MED ORDER — FAMOTIDINE 20 MG PO TABS
20 | ORAL_TABLET | Freq: Two times a day (BID) | ORAL | 0 refills | Status: AC
Start: 2022-04-15 — End: ?

## 2022-04-15 MED ORDER — OXYCODONE-ACETAMINOPHEN 5-325 MG PO TABS
5-325 | ORAL_TABLET | Freq: Four times a day (QID) | ORAL | 0 refills | Status: AC | PRN
Start: 2022-04-15 — End: 2022-04-18

## 2022-04-15 MED ORDER — AMOXICILLIN 500 MG PO CAPS
500 | ORAL_CAPSULE | Freq: Two times a day (BID) | ORAL | 0 refills | Status: AC
Start: 2022-04-15 — End: 2022-04-22

## 2022-04-15 MED ORDER — ALBUTEROL SULFATE HFA 108 (90 BASE) MCG/ACT IN AERS
108 | Freq: Four times a day (QID) | RESPIRATORY_TRACT | 3 refills | Status: AC | PRN
Start: 2022-04-15 — End: ?

## 2022-04-15 MED ORDER — ONDANSETRON HCL 4 MG/2ML IJ SOLN
4 | Freq: Once | INTRAMUSCULAR | Status: AC
Start: 2022-04-15 — End: 2022-04-15
  Administered 2022-04-15: 12:00:00 4 mg via INTRAVENOUS

## 2022-04-15 MED ORDER — ONDANSETRON HCL 4 MG/2ML IJ SOLN
4 | Freq: Once | INTRAMUSCULAR | Status: AC
Start: 2022-04-15 — End: 2022-04-15
  Administered 2022-04-15: 11:00:00 4 mg via INTRAVENOUS

## 2022-04-15 MED FILL — HYDROMORPHONE HCL 1 MG/ML IJ SOLN: 1 MG/ML | INTRAMUSCULAR | Qty: 1

## 2022-04-15 MED FILL — ONDANSETRON HCL 4 MG/2ML IJ SOLN: 4 MG/2ML | INTRAMUSCULAR | Qty: 2

## 2022-04-15 MED FILL — AMOXICILLIN-POT CLAVULANATE 875-125 MG PO TABS: 875-125 MG | ORAL | Qty: 1

## 2022-04-15 MED FILL — MORPHINE SULFATE 4 MG/ML IJ SOLN: 4 mg/mL | INTRAMUSCULAR | Qty: 1

## 2022-04-15 MED FILL — FAMOTIDINE (PF) 20 MG/2ML IV SOLN: 20 MG/2ML | INTRAVENOUS | Qty: 2

## 2022-04-15 NOTE — ED Provider Notes (Signed)
[facility]  Emergency Department Treatment Report    Patient: Rhonda [place] Age: 32 y.o. Sex: female    Date of Birth: 08-03-1990 Admit Date: 04/15/2022 PCP: Lisbeth Renshaw, MD   MRN: [id number]  CSN: [id number]     Room: ER30/ER30 Time Dictated: 6:50 AM      Payor: [place] / Plan: [facility] / Product Type: *No Product type* /     Chief Complaint   Chief Complaint   Patient presents with    Abdominal Pain       History of Present Illness   This is a 32 y.o. female with a history of pancreatitis, alcohol and cocaine abuse, gastritis, and prior ovarian cysts who presents with complaints of abdominal pain, nausea, and vomiting.  She reports that the symptoms started on Wednesday 04/12/22.  She reports pain is in the upper abdomen in the epigastric and left upper quadrant region.  She describes pain as being consistent with previous episodes of pancreatitis.  She describes pain as being "sharp" and currently "10/10" in severity.  She reports that she has had nausea and frequent episodes of vomiting.  She has been unable to keep anything down for the past several days.  She denies any fever or other systemic infectious symptoms.  She denies any significant chest pain or shortness of breath.  She reports history of alcohol use but states that she has not had any alcohol for "a couple of weeks."  She denies significant associated urinary symptoms.    She was seen back on 02/25/22 for similar abdominal pain with unremarkable labs at that time.  She has last had imaging on 03/20/22 and at the hospital for similar complaints of abdominal pain which showed no evidence of significant acute abnormalities.  She has previously been followed by Dr. Lu Duffel of gastroenterology for her chronic abdominal pain complaints and previous episodes of gastritis.    Review of Systems   Review of Systems   Constitutional:  Positive for appetite change (decreased) and fatigue. Negative for  chills and fever.   HENT:  Negative for congestion and sore throat.    Eyes:  Negative for visual disturbance.   Respiratory:  Negative for cough, shortness of breath and wheezing.    Cardiovascular:  Negative for chest pain, palpitations and leg swelling.   Gastrointestinal:  Positive for abdominal pain, nausea and vomiting. Negative for blood in stool, constipation and diarrhea.   Genitourinary:  Positive for decreased urine volume. Negative for dysuria, flank pain, frequency and hematuria.   Musculoskeletal:  Positive for back pain. Negative for neck pain.   Skin:  Negative for rash.   Neurological:  Negative for dizziness, weakness, light-headedness, numbness and headaches.   Hematological:  Does not bruise/bleed easily.   Psychiatric/Behavioral:  Negative for confusion. The patient is nervous/anxious.      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     Intractable nausea and vomiting 06/26/2020    Obesity 12/16/2018    Ovarian cyst     Pancreatitis      Past Surgical History:   Procedure Laterality Date    CESAREAN SECTION      CHOLECYSTECTOMY      UPPER GASTROINTESTINAL ENDOSCOPY N/A 10/12/2021  EGD DIAGNOSTIC performed by Sharman Crate, MD at [facility] History     Socioeconomic History    Marital status: Single   Tobacco Use    Smoking status: Every Day     Types: Cigars   Vaping Use    Vaping Use: Never used   Substance and Sexual Activity    Alcohol use: Yes     Alcohol/week: 21.0 standard drinks of alcohol     Types: 21 Shots of liquor per week     Comment: 2-3 drinks a day, sometimes more    Drug use: Yes     Frequency: 7.0 times per week     Types: Marijuana (Weed)     Social Determinants of Health     Food Insecurity: No Food Insecurity (12/16/2021)    Hunger Vital Sign     Worried About  Running Out of Food in the Last Year: Never true     Ran Out of Food in the Last Year: Never true   Transportation Needs: No Transportation Needs (12/16/2021)    PRAPARE - Armed forces logistics/support/administrative officer (Medical): No     Lack of Transportation (Non-Medical): No   Housing Stability: Low Risk  (12/16/2021)    Housing Stability Vital Sign     Unable to Pay for Housing in the Last Year: No     Number of Places Lived in the Last Year: 1     Unstable Housing in the Last Year: No       Family History     Family History   Problem Relation Age of Onset    No Known Problems Mother        Current Medications     Current Facility-Administered Medications   Medication Dose Route Frequency Provider Last Rate Last Admin    ondansetron (ZOFRAN) injection 4 mg  4 mg IntraVENous Once Leonides Cave D, DO        morphine injection 4 mg  4 mg IntraVENous NOW Leonides Cave D, DO        sodium chloride 0.9 % bolus 1,000 mL  1,000 mL IntraVENous Once Rae Halsted, DO         Current Outpatient Medications   Medication Sig Dispense Refill    ondansetron (ZOFRAN-ODT) 4 MG disintegrating tablet Take 1 tablet by mouth 3 times daily as needed for Nausea or Vomiting 21 tablet 0    dicyclomine (BENTYL) 20 MG tablet Take 1 tablet by mouth 4 times daily for 10 days 40 tablet 0    famotidine (PEPCID) 20 MG tablet Take 1 tablet by mouth 2 times daily for 10 days 20 tablet 0    sucralfate (CARAFATE) 1 GM tablet Take 1 tablet by mouth 4 times daily for 10 days 40 tablet 0    pantoprazole (PROTONIX) 40 MG tablet Take 1 tablet by mouth 2 times daily (before meals) 60 tablet 1    albuterol sulfate HFA (PROVENTIL;VENTOLIN;PROAIR) 108 (90 Base) MCG/ACT inhaler Inhale 2 puffs into the lungs every 6 hours as needed for Wheezing (Patient not taking: Reported on 12/16/2021) 18 g 3    albuterol (ACCUNEB) 0.63 MG/3ML nebulizer solution Inhale 0.63 mg into the lungs every 6 hours as needed (Patient not taking: Reported on 12/16/2021)          Allergies     Allergies   Allergen Reactions    Droperidol Other (  See Comments)     Face twitching, drooling and spontaneous leg movement       Physical Exam     ED Triage Vitals [04/15/22 0641]   BP Temp Temp Source Pulse Respirations SpO2 Height Weight - Scale   (!) 155/100 98.3 F (36.8 C) Oral 91 19 96 % 1.651 m (5\' 5" ) 92.5 kg (204 lb)     Vitals:    04/15/22 0641   BP: (!) 155/100   Pulse: 91   Resp: 19   Temp: 98.3 F (36.8 C)   TempSrc: Oral   SpO2: 96%   Weight: 92.5 kg (204 lb)   Height: 1.651 m (5\' 5" )     I have reviewed documented vital signs, which are within age-appropriate parameters.   I have reviewed nursing documentation.    Physical Exam  Vitals and nursing note reviewed.   Constitutional:       General: She is in acute distress (significant painful distress).   HENT:      Head: Normocephalic and atraumatic.      Right Ear: External ear normal.      Left Ear: External ear normal.      Nose: Nose normal.      Mouth/Throat:      Mouth: Mucous membranes are dry.      Pharynx: No oropharyngeal exudate or posterior oropharyngeal erythema.      Comments: Patient noted to have swelling and erythema to the right mandibular molar gums without significant fluctuance.  She reports she "squeezed pus" from this site, but there is no significant fluctuance or evidence of purulent drainage.  Eyes:      Conjunctiva/sclera: Conjunctivae normal.   Cardiovascular:      Rate and Rhythm: Normal rate and regular rhythm.      Heart sounds: No murmur heard.  Pulmonary:      Effort: Pulmonary effort is normal. No respiratory distress.      Breath sounds: Normal breath sounds.   Abdominal:      General: Bowel sounds are normal. There is no distension.      Palpations: Abdomen is soft.      Tenderness: There is abdominal tenderness in the epigastric area and left upper quadrant. There is guarding (voluntary). There is no right CVA tenderness, left CVA tenderness or rebound.   Musculoskeletal:      Right lower leg:  No edema.      Left lower leg: No edema.   Skin:     General: Skin is warm and dry.      Findings: No rash.   Neurological:      Mental Status: She is alert and oriented to person, place, and time.      Sensory: No sensory deficit.      Motor: No weakness.   Psychiatric:         Mood and Affect: Mood is anxious.         Behavior: Behavior normal.       Diagnostic Studies   Lab:   Results for orders placed or performed during the hospital encounter of 04/15/22   CBC with Auto Differential   Result Value Ref Range    WBC 8.2 4.0 - 11.0 1000/mm3    RBC 4.17 3.60 - 5.20 M/uL    Hemoglobin 11.5 11.0 - 16.0 gm/dl    Hematocrit 32.6 (L) 35.0 - 47.0 %    MCV 78.2 (L) 80.0 - 98.0 fL    MCH 27.6 25.4 - 34.6  pg    MCHC 35.3 30.0 - 36.0 gm/dl    Platelets 207 140 - 450 1000/mm3    MPV 10.9 (H) 6.0 - 10.0 fL    RDW 41.1 36.4 - 46.3      Nucleated RBCs 0 0 - 0      Immature Granulocytes 0.4 0.0 - 3.0 %    Neutrophils Segmented 58.9 34 - 64 %    Lymphocytes 30.2 28 - 48 %    Monocytes 8.3 1 - 13 %    Eosinophils 2.0 0 - 5 %    Basophils 0.2 0 - 3 %   CMP   Result Value Ref Range    Potassium 4.3 3.5 - 5.1 mEq/L    Chloride 110 (H) 98 - 107 mEq/L    Sodium 140 136 - 145 mEq/L    CO2 21 20 - 31 mEq/L    Glucose 99 74 - 106 mg/dl    BUN 6 (L) 9 - 23 mg/dl    Creatinine 0.69 0.55 - 1.02 mg/dl    GFR African American >60.0      GFR Non-African American >60      Calcium 9.8 8.7 - 10.4 mg/dl    Anion Gap 9 5 - 15 mmol/L    AST 23.0 0.0 - 33.9 U/L    ALT 8 (L) 10 - 49 U/L    Alkaline Phosphatase 68 46 - 116 U/L    Total Bilirubin 0.40 0.30 - 1.20 mg/dl    Total Protein 8.1 5.7 - 8.2 gm/dl    Albumin 4.2 3.4 - 5.0 gm/dl   Lipase   Result Value Ref Range    Lipase 27 12 - 53 U/L   ETOH   Result Value Ref Range    Ethanol Lvl NEGATIVE 0.0 - 3.0 mg/dl     Based on my interpretation the CMP from today shows no evidence of significant electrolyte abnormalities, renal impairment, bilirubin or liver function test abnormalities, or other  significant acute abnormalities.  Based on my interpretation the CBC from today shows no evidence of significant anemia, leukocytosis, or other significant acute abnormalities.  Based on my interpretation the ethanol level is negative.  I have reviewed all ordered labs and used them in my medical decision making (MDM) as documented below.    ED Course     RECORDS REVIEWED: I reviewed the patient's previous records here at [facility] and available outside facilities and note that the patient has had multiple prior evaluations for similar episodes of abdominal pain.  She has had a previous history of pancreatitis that is felt to be secondary to ongoing alcohol abuse.     EXTERNAL RESULTS REVIEWED: She has been seen at outside facilities with her most recent CT scan having been obtained on 03/20/22 for similar episode of abdominal pain and ultimately noted to have no evidence of significant acute abnormalities.     SOCIAL DETERMINANTS impacting Evaluation and Management: Personal health habits and adaptability as the patient has continued to drink despite her chronic abdominal pain and frequent bouts of pancreatitis.  However, the patient states that she has not had alcohol in a couple of weeks.    Comorbidities impacting Evaluation and Management: Recurrent pancreatitis and gastritis    Medical Decision Making   Chief Complaint: Abdominal Pain     32 y.o. female with abdominal pain, nausea and vomiting with history and exam consistent with acute exact patient of chronic abdominal pain with likely chronic pancreatitis.  Initial considerations in this patient included acute and chronic etiologies of pancreatitis, pancreatic pseudocyst, pancreatic cyst, pancreatic necrosis, gastritis secondary to alcohol, NSAIDs, and infectious etiologies, peptic ulcer disease, gallbladder diseases including acute cholecystitis, cholangitis, and choledocholithiasis, colitis,food poisoning, infectious gastroenteritis, and hepatitis from various  etiologies  among others.    Patient presented with complaints of abdominal pain, nausea, vomiting, and inability to tolerate fluids by mouth.  Patient does have a history of pancreatitis with similar flares of abdominal pain in the past.  She does also have a history of acute gastritis with similar episodes of abdominal pain in the past.  She has had previous endoscopy.  She has had multiple recent visits for abdominal pain with fairly unremarkable workups.  She had had a CT scan earlier in the month that showed no evidence of acute pancreatitis or other significant acute abnormalities.  The patient does continue to drink alcohol but states that it has been several weeks since her last intake which was prior to her prior evaluation earlier this month.  She reports that she has had a history of gastritis and has not been taking her antacid medications due to being unable to afford them due to insurance and financial issues.  She reports that she has not had any fever or other infectious symptoms.  The patient was noted to have no urinary symptoms.  She was noted to have no evidence of peritonitis on exam.  The patient was noted to have improvement with analgesic medications in the ED.    She reported additional concern about a possible dental abscess.  The patient was noted to have an area of erythema and swelling without significant fluctuance or purulent drainage.  The patient was noted to have a history of poor dentition and states that she has not had seen a dentist recently for evaluation.  We discussed use of antibiotics as we were unable to find any evidence of an abscess that would be amenable to drainage in the ED.    As part of the workup, I considered possible drug-seeking behavior in this patient and reviewed her PDMP for clarification of her recent medications.  We did discuss with the patient that she is going to need to abstain from alcohol intake as this is likely contributing to her recurrent  episodes of abdominal pain.  We discussed that she would need to restart her antacid medications and follow-up with gastroenterology for further consideration of repeat upper endoscopy.    Prior to discharge we discussed return precautions, specifically for worsening pain or inability to tolerate food and fluids despite treatment with antiemetics, treatment with analgesic medications, antiemetics, and antibiotics for dental infection, and follow up with primary care doctor, gastroenterology, and dental for further evaluation, and the patient demonstrated understanding and agreement with this plan.    Medications   ondansetron (ZOFRAN) injection 4 mg (4 mg IntraVENous Given 04/15/22 0656)   morphine injection 4 mg (4 mg IntraVENous Given 04/15/22 0655)   sodium chloride 0.9 % bolus 1,000 mL (0 mLs IntraVENous Stopped 04/15/22 0751)   famotidine (PEPCID) 20 mg in sodium chloride (PF) 0.9 % 10 mL injection (20 mg IntraVENous Given 04/15/22 0702)   HYDROmorphone (DILAUDID) injection 1 mg (1 mg IntraVENous Given 04/15/22 0750)   ondansetron (ZOFRAN) injection 4 mg (4 mg IntraVENous Given 04/15/22 0750)   HYDROmorphone (DILAUDID) injection 1 mg (1 mg IntraVENous Given 04/15/22 0937)   amoxicillin-clavulanate (AUGMENTIN) 875-125 MG per tablet 1 tablet (1 tablet Oral Given 04/15/22 1006)  Final Diagnosis     1. Alcohol-induced chronic pancreatitis ([place])    2. Acute alcoholic gastritis without hemorrhage    3. Nausea and vomiting, unspecified vomiting type    4. Dehydration    5. Dental infection        Disposition   Discharged home with recommendation for close outpatient follow-up.      Lisbeth Renshaw, MD  [address]  [place] [address]  [phone]    In 1 week  Follow-up for further evaluation of abdominal pain in the setting of recurrent pancreatitis and ongoing alcohol use.    [facility]  [id number]  [place] [address]  [phone]    Follow up for further  evaluation and to discuss treatment for ongoing alcohol abuse       Leonides Cave, DO  April 15, 2022    My signature above authenticates this document and my orders, the final    diagnosis (es), discharge prescription (s), and instructions in the Epic    record.  If you have any questions please contact [phone].     Nursing notes have been reviewed by the physician/ advanced practice    Clinician.    Dragon medical dictation software was used for portions of this report. Unintended voice recognition grammatical errors may occur.        Rae Halsted, DO  04/15/22 1023

## 2022-04-15 NOTE — Discharge Instructions (Addendum)
Based on our clinical examination in the emergency department today, we feel you are safe for discharge home with the follow up recommendations noted above. You should return to the emergency department if your condition worsens significantly, fails to improve as we discussed, or if you note any new or concerning symptoms.    Toothaches are generally caused by tooth decay. If you have severe pain or swelling around a tooth, you may also have an infection.  Tooth decay and infections require evaluation and treatment by a dentist or an oral surgeon.    [facility] Emergency Department will provide you with the best possible care available in an emergency department.  As with most emergency departments, we do not hve a dental clinic or dentist available in the department.  If you have a severe toothache, medications (such as NSAIDS or antibiotics if indicated) will be provided for you until you are able to see a dentist.  Even if you feel better, you will still need to follow up with a detist or dental clinic for further treatment.      The effectiveness of nonsteroidal anti-inflammatory drugs for dental pain has been well established in many studies, and they are considered first-line treatment.  It is therefore the policy of the [facility] Emergency Department to use nonsteroidal anti-inflammatory agents (NSAIDS) such as ibuprofen (Motrin, Advil, etc) or naproxyn (Naprosyn, Aleve, others) as the primary medication for pain.  If you are unable to take these medications acetaminophen (Tylenol) will generally be used.      Below is a list of the dental clinics in the area.  Be sure to follow up with them to complete the treatment of your dental problems.    [facility]  [address] [place] [address]    [phone]  Fillings, cleanings and assessments by appointment.  Services are provided for those without health and/or dental insurance and who meet  income guidelines.  Free care to those who qualify.    [facility]  [place], [place]   Call for appointments.  Sliding fee scale.  Dental services mainly for children but will see a limited number of adults.    [facility]  [address] [place], [place]  Call on Mondays only between 10am and 12 pm for appointment.  [place] residents only.  Will see people with Medicare  $15 fee per visit.    [facility]  [place], [place]  Call for appointment.  Sliding payment scale. Minimum payment $50 per visit.  Will see patients with and without health/dental insurance.    [facility]  [address]. [place], [place]  Call the clinic [phone], Monday through Friday between 8:00 a.m. and 4:00 for more information or to schedule an appointment.    The following labs were obtained during your emergency department evaluation, which you should bring to your next primary care provider appointment for follow up:    Results for orders placed or performed during the hospital encounter of 04/15/22   CBC with Auto Differential   Result Value Ref Range    WBC 8.2 4.0 - 11.0 1000/mm3    RBC 4.17 3.60 - 5.20 M/uL    Hemoglobin 11.5 11.0 - 16.0 gm/dl    Hematocrit 32.6 (L) 35.0 - 47.0 %    MCV  78.2 (L) 80.0 - 98.0 fL    MCH 27.6 25.4 - 34.6 pg    MCHC 35.3 30.0 - 36.0 gm/dl    Platelets 207 140 - 450 1000/mm3    MPV 10.9 (H) 6.0 - 10.0 fL    RDW 41.1 36.4 - 46.3      Nucleated RBCs 0 0 - 0      Immature Granulocytes 0.4 0.0 - 3.0 %    Neutrophils Segmented 58.9 34 - 64 %    Lymphocytes 30.2 28 - 48 %    Monocytes 8.3 1 - 13 %    Eosinophils 2.0 0 - 5 %    Basophils 0.2 0 - 3 %   CMP   Result Value Ref Range    Potassium 4.3 3.5 - 5.1 mEq/L    Chloride 110 (H) 98 - 107 mEq/L    Sodium 140 136 - 145 mEq/L    CO2 21 20 - 31 mEq/L    Glucose 99 74 - 106 mg/dl    BUN 6 (L)  9 - 23 mg/dl    Creatinine 0.69 0.55 - 1.02 mg/dl    GFR African American >60.0      GFR Non-African American >60      Calcium 9.8 8.7 - 10.4 mg/dl    Anion Gap 9 5 - 15 mmol/L    AST 23.0 0.0 - 33.9 U/L    ALT 8 (L) 10 - 49 U/L    Alkaline Phosphatase 68 46 - 116 U/L    Total Bilirubin 0.40 0.30 - 1.20 mg/dl    Total Protein 8.1 5.7 - 8.2 gm/dl    Albumin 4.2 3.4 - 5.0 gm/dl   Lipase   Result Value Ref Range    Lipase 27 12 - 53 U/L   ETOH   Result Value Ref Range    Ethanol Lvl NEGATIVE 0.0 - 3.0 mg/dl       The following imaging studies were obtained during your emergency department evaluation, which you should bring to your next primary care provider appointment for follow up:    CT ABD/PELVIS-IV ONLY    Result Date: 04/12/2022  EXAM: CT ABD/PELVIS-IV ONLY CLINICAL INDICATION/HISTORY: Abdominal pain, acute, nonlocalized, h/o necrotic pancreatitis, stent, endorses abdomen pain, nausea and vomiting x 2 days COMPARISON: CT A/P 03/20/2022 TECHNIQUE:  CT abdomen and pelvis with IV contrast. All CT scans at this facility are performed using dose optimization technique as appropriate to the performed examination, to include automated exposure control, adjustment of the mA and/or kV according to patient's size (including appropriate matching for site-specific examinations), or use of an iterative reconstruction technique. FINDINGS: Lower chest: Mild bibasilar atelectasis. Liver: Negative. Biliary: Gallbladder surgically absent. Pancreas: Unchanged appearance of AXIOS stent between the pancreatic head and gastric pylorus. Spleen: Negative. Adrenal glands: Negative. Kidneys: Enhance symmetrically. No hydronephrosis. Stomach, Small Bowel and Colon: Stomach is unremarkable. Small bowel and colon without evidence of obstruction. Appendix is normal. Pelvic Organs: Negative Bladder: Suboptimally distended, limiting evaluation. Lymph nodes: No lymphadenopathy. Vessels: Unremarkable for age. Peritoneal Spaces: No free fluid or free  air. Body wall: Tiny fat-containing umbilical hernia. Bones: No acute or aggressive osseous abnormalities.    1. No acute abnormalities of the abdomen or pelvis. 2. Unchanged appearance of AXIOS stent. Dictated ByImagene Sheller on 04/12/2022 10:19 PM As attending radiologist, I have assessed the study images, and dictated or reviewed/edited the final report as needed. Signed By: Whitney Muse on 04/12/2022 10:49 PM    CT -  ABD/PELVIS-IV ONLY    Result Date: 03/20/2022  EXAM: CT ABD/PELVIS-IV ONLY CLINICAL INDICATION/HISTORY: Abdominal pain, acute, nonlocalized Pancreatitis, acute, severe COMPARISON: Previous exam from 1 7/24. TECHNIQUE:  CT abdomen and pelvis with IV contrast. All CT scans at this facility are performed using dose optimization technique as appropriate to the performed examination, to include automated exposure control, adjustment of the mA and/or kV according to patient's size (including appropriate matching for site-specific examinations), or use of an iterative reconstruction technique. FINDINGS: Limited exam due to motion artifact. Heart is normal in size. No pericardial or pleural effusion. Clear lung bases. Liver, spleen, pancreas, adrenals and kidneys within normal limits. Stable metallic stent is seen between the pancreatic head and gastric antrum. Main portal vein is patent. No free pelvic fluid or ascites. Urinary bladder is within normal limits. Uterus is present. No abnormally dilated bowel loops to suggest bowel obstruction. Normal appendix. No pneumoperitoneum. No suspicious bony lesion.    No acute findings. Signed By: Lovie Chol, DO on 03/20/2022 9:28 PM

## 2022-04-15 NOTE — ED Triage Notes (Signed)
Patient arrives via ems from home with complaint of abdominal pain.     Hx of pancreatitis

## 2022-04-19 ENCOUNTER — Inpatient Hospital Stay
Admit: 2022-04-19 | Discharge: 2022-04-19 | Disposition: A | Discharge status: Home / Self Care | Payer: PRIVATE HEALTH INSURANCE | Attending: Emergency Medicine

## 2022-04-19 DIAGNOSIS — R109 Unspecified abdominal pain: Secondary | ICD-10-CM

## 2022-04-19 DIAGNOSIS — G8929 Other chronic pain: Secondary | ICD-10-CM

## 2022-04-19 LAB — LIPASE: Lipase: 25 U/L (ref 12–53)

## 2022-04-19 LAB — CBC WITH AUTO DIFFERENTIAL
Atypical Lymphocytes: 0 % (ref 0–0)
Basophils: 0 % (ref 0–3)
Eosinophils: 4.9 % (ref 0–5)
Hematocrit: 31.7 % — ABNORMAL LOW (ref 35.0–47.0)
Hemoglobin: 11.2 gm/dl (ref 11.0–16.0)
Immature Granulocytes: 0.2 % (ref 0.0–3.0)
Lymphocytes: 34.3 % (ref 28–48)
MCH: 27.2 pg (ref 25.4–34.6)
MCHC: 35.3 gm/dl (ref 30.0–36.0)
MCV: 76.9 fL — ABNORMAL LOW (ref 80.0–98.0)
MPV: 10.5 fL — ABNORMAL HIGH (ref 6.0–10.0)
Monocytes: 8.8 % (ref 1–13)
Neutrophils Segmented: 51 % (ref 34–64)
Nucleated RBCs: 0 (ref 0–0)
Plasma Cells: 1
Platelet Appearance: NORMAL
Platelets: 240 10*3/uL (ref 140–450)
RBC: 4.12 M/uL (ref 3.60–5.20)
RDW: 40.3 (ref 36.4–46.3)
WBC: 5.7 10*3/uL (ref 4.0–11.0)

## 2022-04-19 LAB — COMPREHENSIVE METABOLIC PANEL
ALT: 11 U/L (ref 10–49)
AST: 45 U/L — ABNORMAL HIGH (ref 0.0–33.9)
Albumin: 4 gm/dl (ref 3.4–5.0)
Alkaline Phosphatase: 54 U/L (ref 46–116)
Anion Gap: 5 mmol/L (ref 5–15)
BUN: 5 mg/dl — ABNORMAL LOW (ref 9–23)
CO2: 25 mEq/L (ref 20–31)
Calcium: 9.2 mg/dl (ref 8.7–10.4)
Chloride: 107 mEq/L (ref 98–107)
Creatinine: 0.62 mg/dl (ref 0.55–1.02)
GFR African American: >60
GFR Non-African American: >60
Glucose: 93 mg/dl (ref 74–106)
Potassium: 5.6 mEq/L — ABNORMAL HIGH (ref 3.5–5.1)
Sodium: 137 mEq/L (ref 136–145)
Total Bilirubin: 0.4 mg/dl (ref 0.30–1.20)
Total Protein: 7.9 gm/dl (ref 5.7–8.2)

## 2022-04-19 LAB — POC CHEM 8
BUN: 5 mg/dl — ABNORMAL LOW (ref 7–25)
Calcium, Ionized: 4.4 mg/dL (ref 4.40–5.40)
Chloride: 104 mEq/L (ref 98–107)
Creatinine: 0.5 mg/dl — ABNORMAL LOW (ref 0.6–1.3)
Glucose: 95 mg/dL (ref 74–106)
Hematocrit: 41 % (ref 38–45)
Hemoglobin: 13.9 gm/dl (ref 13.0–17.2)
Potassium: 4.4 mEq/L (ref 3.5–4.9)
Sodium: 139 mEq/L (ref 136–145)
Total CO2: 26 mmol/L (ref 21–32)

## 2022-04-19 LAB — POCT URINALYSIS DIPSTICK
Blood, Urine: NEGATIVE
Glucose, Ur: NEGATIVE mg/dl
Ketones, Urine: 80 mg/dl — AB
Leukocyte Esterase, Urine: NEGATIVE
Nitrite, Urine: NEGATIVE
Protein, Urine: 100 mg/dl — AB
Specific Gravity, Urine: 1.025 (ref 1.005–1.030)
Urobilinogen, Urine: 1 EU/dl (ref 0.0–1.0)
pH, Urine: 6 (ref 5–9)

## 2022-04-19 LAB — POC PREGNANCY UR-QUAL: Pregnancy, Urine: NEGATIVE

## 2022-04-19 MED ORDER — HYDROMORPHONE HCL 1 MG/ML IJ SOLN
1 | Freq: Once | INTRAMUSCULAR | Status: AC
Start: 2022-04-19 — End: 2022-04-19
  Administered 2022-04-19: 14:00:00 1 mg via INTRAVENOUS

## 2022-04-19 MED ORDER — LORAZEPAM 2 MG/ML IJ SOLN
2 | Freq: Once | INTRAMUSCULAR | Status: AC
Start: 2022-04-19 — End: 2022-04-19
  Administered 2022-04-19: 15:00:00 1 mg via INTRAVENOUS

## 2022-04-19 MED ORDER — LACTATED RINGERS IV BOLUS
Freq: Once | INTRAVENOUS | Status: AC
Start: 2022-04-19 — End: 2022-04-19
  Administered 2022-04-19: 15:00:00 1000 mL via INTRAVENOUS

## 2022-04-19 MED ORDER — PANTOPRAZOLE SODIUM 40 MG IV SOLR
40 | Freq: Once | INTRAVENOUS | Status: AC
Start: 2022-04-19 — End: 2022-04-19
  Administered 2022-04-19: 14:00:00 40 mg via INTRAVENOUS

## 2022-04-19 MED ORDER — HYDROCODONE-ACETAMINOPHEN 5-325 MG PO TABS
5-325 | ORAL_TABLET | Freq: Four times a day (QID) | ORAL | 0 refills | Status: AC | PRN
Start: 2022-04-19 — End: 2022-04-22

## 2022-04-19 MED ORDER — ONDANSETRON HCL 4 MG/2ML IJ SOLN
4 | Freq: Four times a day (QID) | INTRAMUSCULAR | Status: DC | PRN
Start: 2022-04-19 — End: 2022-04-19
  Administered 2022-04-19: 14:00:00 4 mg via INTRAVENOUS

## 2022-04-19 MED ORDER — HYDROCODONE-ACETAMINOPHEN 5-325 MG PO TABS
5-325 | ORAL | Status: AC
Start: 2022-04-19 — End: 2022-04-19
  Administered 2022-04-19: 16:00:00 1 via ORAL

## 2022-04-19 MED ORDER — DICYCLOMINE HCL 10 MG/ML IM SOLN
10 | INTRAMUSCULAR | Status: AC
Start: 2022-04-19 — End: 2022-04-19
  Administered 2022-04-19: 14:00:00 20 mg via INTRAMUSCULAR

## 2022-04-19 MED FILL — DICYCLOMINE HCL 10 MG/ML IM SOLN: 10 MG/ML | INTRAMUSCULAR | Qty: 2

## 2022-04-19 MED FILL — ONDANSETRON HCL 4 MG/2ML IJ SOLN: 4 MG/2ML | INTRAMUSCULAR | Qty: 2

## 2022-04-19 MED FILL — PANTOPRAZOLE SODIUM 40 MG IV SOLR: 40 MG | INTRAVENOUS | Qty: 40

## 2022-04-19 MED FILL — LORAZEPAM 2 MG/ML IJ SOLN: 2 MG/ML | INTRAMUSCULAR | Qty: 1

## 2022-04-19 MED FILL — HYDROMORPHONE HCL 1 MG/ML IJ SOLN: 1 MG/ML | INTRAMUSCULAR | Qty: 1

## 2022-04-19 MED FILL — HYDROCODONE-ACETAMINOPHEN 5-325 MG PO TABS: 5-325 MG | ORAL | Qty: 1

## 2022-04-19 NOTE — Progress Notes (Signed)
Rounded on pt in vertical care. Provided socks per pt request. No further concerns or needs expressed at this time.

## 2022-04-19 NOTE — Progress Notes (Signed)
Rounded on pt in triage. Provided blanket. No concern or need expressed at this time.

## 2022-04-19 NOTE — ED Notes (Signed)
Patient still in pain, provider made aware and orders given     Gibson Ramp, RN  04/19/22 1036

## 2022-04-19 NOTE — ED Notes (Signed)
Patient is still in pain, will let the provider know     Gibson Ramp, RN  04/19/22 1145

## 2022-04-19 NOTE — ED Triage Notes (Signed)
Pt brought in by EMS with c/o abd pain with N/V that began yesterday.  Pain rated 10/10

## 2022-04-19 NOTE — Discharge Instructions (Signed)
Consider following up with pain management. Return to the ED for worsening symptoms.

## 2022-04-19 NOTE — ED Provider Notes (Signed)
[facility]  Emergency Department Treatment Report    Patient: Rhonda Hammond Age: 32 y.o. Sex: female    Date of Birth: Apr 16, 1990 Admit Date: 04/19/2022 PCP: Danny Lawless, MD   MRN: [id number]  CSN: [id number]     Room: H08/H08 Time Dictated: 6:29 PM        Chief Complaint   Abdominal pain     History of Present Illness   32 y.o. female with history of alcohol use disorder, chronic pancreatitis, prior cocaine use, gastritis, and chronic abdominal pain is presenting to the ED for evaluation of typical epigastric abdominal pain, which she reports has been ongoing since yesterday associated with nausea and vomiting.  She states that she has been unable to keep any food or fluids down.  Symptoms are typical of prior presentations to the ED.  She denies any new changes.  Denies fevers, diarrhea, hematemesis, hematochezia.  Reports that it has been a couple weeks since her last alcohol intake.  She reports that now she has been taking her Pepcid and PPI.  She also states that she has tried Bentyl and Zofran, which she reports usually helps, but she states that she tried at this time and she was unable to tolerate pain.    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     Intractable nausea and vomiting 06/26/2020    Obesity 12/16/2018    Ovarian cyst     Pancreatitis      Past Surgical History:   Procedure Laterality Date    CESAREAN SECTION      CHOLECYSTECTOMY      UPPER GASTROINTESTINAL ENDOSCOPY N/A 10/12/2021    EGD DIAGNOSTIC performed by Elberta Spaniel, MD at [facility] ENDOSCOPY       Social History     Social History     Socioeconomic History    Marital status: Single   Tobacco Use    Smoking status: Every Day     Types: Cigars   Vaping Use     Vaping Use: Never used   Substance and Sexual Activity    Alcohol use: Yes     Alcohol/week: 21.0 standard drinks of alcohol     Types: 21 Shots of liquor per week     Comment: 2-3 drinks a day, sometimes more    Drug use: Yes     Frequency: 7.0 times per week     Types: Marijuana Sheran Fava)     Social Determinants of Health     Food Insecurity: No Food Insecurity (12/16/2021)    Hunger Vital Sign     Worried About Running Out of Food in the Last Year: Never true     Ran Out of Food in the Last Year: Never true   Transportation Needs: No Transportation Needs (12/16/2021)    PRAPARE - Therapist, art (Medical): No     Lack of Transportation (Non-Medical): No   Housing Stability: Low Risk  (12/16/2021)    Housing Stability Vital Sign     Unable to Pay for Housing in the Last Year: No     Number of Places Lived in the Last Year:  1     Unstable Housing in the Last Year: No       Family History     Family History   Problem Relation Age of Onset    No Known Problems Mother        Current Medications     No current facility-administered medications for this encounter.     Current Outpatient Medications   Medication Sig Dispense Refill    HYDROcodone-acetaminophen (LORCET) 5-325 MG per tablet Take 1 tablet by mouth every 6 hours as needed for Pain for up to 3 days. Intended supply: 3 days. Take lowest dose possible to manage pain Max Daily Amount: 4 tablets 5 tablet 0    famotidine (PEPCID) 20 MG tablet Take 1 tablet by mouth 2 times daily 60 tablet 0    sucralfate (CARAFATE) 1 GM tablet Take 1 tablet by mouth 4 times daily for 14 days 56 tablet 0    ondansetron (ZOFRAN-ODT) 4 MG disintegrating tablet Take 1 tablet by mouth 3 times daily as needed for Nausea or Vomiting 21 tablet 0    promethazine (PHENERGAN) 25 MG tablet Take 1 tablet by mouth 4 times daily as needed for Nausea Take for nausea and vomiting not responding to prescribed Zofran. 20 tablet 0    amoxicillin (AMOXIL) 500 MG capsule Take 1  capsule by mouth 2 times daily for 7 days 14 capsule 0    albuterol sulfate HFA (PROVENTIL;VENTOLIN;PROAIR) 108 (90 Base) MCG/ACT inhaler Inhale 2 puffs into the lungs every 6 hours as needed for Wheezing 18 g 3    dicyclomine (BENTYL) 20 MG tablet Take 1 tablet by mouth 4 times daily for 10 days 40 tablet 0       Allergies     Allergies   Allergen Reactions    Droperidol Other (See Comments)     Face twitching, drooling and spontaneous leg movement       Physical Exam     ED Triage Vitals [04/19/22 0750]   Enc Vitals Group      BP 133/84      Pulse 63      Respirations 18      Temp 98.2 F (36.8 C)      Temp Source Oral      SpO2 98 %      Weight - Scale 92.5 kg (204 lb)      Height 1.651 m (5\' 5" )      Head Circumference       Peak Flow       Pain Score       Pain Loc       Pain Edu?       Excl. in GC?          Physical Exam  Vitals and nursing note reviewed.   Constitutional:       General: She is in acute distress.      Appearance: She is not ill-appearing.   HENT:      Head: Normocephalic and atraumatic.      Nose: Nose normal. No rhinorrhea.   Eyes:      General: No scleral icterus.     Extraocular Movements: Extraocular movements intact.   Cardiovascular:      Rate and Rhythm: Normal rate and regular rhythm.      Heart sounds: No murmur heard.  Pulmonary:      Effort: Pulmonary effort is normal. No respiratory distress.      Breath sounds: No wheezing, rhonchi  or rales.   Chest:      Chest wall: No tenderness.   Abdominal:      General: Abdomen is flat. There is no distension.      Tenderness: There is abdominal tenderness in the epigastric area. There is no guarding or rebound.   Musculoskeletal:         General: No swelling or signs of injury.   Skin:     General: Skin is warm and dry.      Capillary Refill: Capillary refill takes less than 2 seconds.   Neurological:      General: No focal deficit present.      Mental Status: She is alert.   Psychiatric:         Mood and Affect: Mood is anxious.          Behavior: Behavior normal.      Comments: Tearful           Diagnostic Studies   Lab:   Recent Results (from the past 12 hour(s))   CBC with Diff    Collection Time: 04/19/22  8:45 AM   Result Value Ref Range    WBC 5.7 4.0 - 11.0 1000/mm3    RBC 4.12 3.60 - 5.20 M/uL    Hemoglobin 11.2 11.0 - 16.0 gm/dl    Hematocrit 16.131.7 (L) 35.0 - 47.0 %    MCV 76.9 (L) 80.0 - 98.0 fL    MCH 27.2 25.4 - 34.6 pg    MCHC 35.3 30.0 - 36.0 gm/dl    Platelets 096240 045140 - 450 1000/mm3    MPV 10.5 (H) 6.0 - 10.0 fL    RDW 40.3 36.4 - 46.3      Nucleated RBCs 0 0 - 0      Immature Granulocytes 0.2 0.0 - 3.0 %    Neutrophils Segmented 51.0 34 - 64 %    Lymphocytes 34.3 28 - 48 %    Atypical Lymphocytes 0.0 0 - 0 %    Monocytes 8.8 1 - 13 %    Eosinophils 4.9 0 - 5 %    Basophils 0.0 0 - 3 %    Plasma Cells 1      Poikilocytosis 2+      Anisocytosis 3+      Macrocytes 3+      CODOCYTES, CODOCYT 2+      Platelet Appearance NORMAL     CMP    Collection Time: 04/19/22  8:45 AM   Result Value Ref Range    Potassium 5.6 (H) 3.5 - 5.1 mEq/L    Chloride 107 98 - 107 mEq/L    Sodium 137 136 - 145 mEq/L    CO2 25 20 - 31 mEq/L    Glucose 93 74 - 106 mg/dl    BUN 5 (L) 9 - 23 mg/dl    Creatinine 4.090.62 8.110.55 - 1.02 mg/dl    GFR African American >60.0      GFR Non-African American >60      Calcium 9.2 8.7 - 10.4 mg/dl    Anion Gap 5 5 - 15 mmol/L    AST 45.0 (H) 0.0 - 33.9 U/L    ALT 11 10 - 49 U/L    Alkaline Phosphatase 54 46 - 116 U/L    Total Bilirubin 0.40 0.30 - 1.20 mg/dl    Total Protein 7.9 5.7 - 8.2 gm/dl    Albumin 4.0 3.4 - 5.0 gm/dl   Lipase    Collection Time:  04/19/22  8:45 AM   Result Value Ref Range    Lipase 25 12 - 53 U/L   POC CHEM 8    Collection Time: 04/19/22  8:46 AM   Result Value Ref Range    Sodium 139 136 - 145 mEq/L    Potassium 4.4 3.5 - 4.9 mEq/L    Chloride 104 98 - 107 mEq/L    Total CO2 26 21 - 32 mmol/L    Glucose 95 74 - 106 mg/dL    BUN 5 (L) 7 - 25 mg/dl    Creatinine 0.5 (L) 0.6 - 1.3 mg/dl    Hematocrit 41 38 - 45 %     Hemoglobin 13.9 13.0 - 17.2 gm/dl    Calcium, Ionized 3.01 4.40 - 5.40 mg/dL   POCT Urinalysis no Micro    Collection Time: 04/19/22  9:45 AM   Result Value Ref Range    Glucose, Ur Negative NEGATIVE,Negative mg/dl    Bilirubin, Urine Moderate (A) NEGATIVE,Negative      Ketones, Urine 80 (A) NEGATIVE,Negative mg/dl    Specific Gravity, Urine 1.025 1.005 - 1.030      Blood, Urine Negative NEGATIVE,Negative      pH, Urine 6.0 5 - 9      Protein, Urine 100 (A) NEGATIVE,Negative mg/dl    Urobilinogen, Urine 1.0 0.0 - 1.0 EU/dl    Nitrite, Urine Negative NEGATIVE,Negative      Leukocyte Esterase, Urine Negative NEGATIVE,Negative      Color, UA Yellow      Clarity, UA Clear     POC Pregnancy Urine Qual    Collection Time: 04/19/22 10:10 AM   Result Value Ref Range    Pregnancy, Urine negative NEGATIVE,Negative,negative         Imaging:    No orders to display           Impression and Management Plan   32 year old female with history stated above is presenting to the ED for evaluation of acute on chronic abdominal pain.  Plan for labs, IVF, symptomatic management, and reevaluation.    Medical Decision Making   Vital signs are within acceptable limits. Labs are within acceptable limits.  Patient required multiple doses of medications while in the ED for her symptoms.  Advised patient that she needs to follow-up with pain management as an outpatient as this seems to be more of a chronic pain management issue.  Advise continued alcohol cessation.  Follow-up with Dr. Stormy Fabian P Mendu.  Do not feel that any emergent imaging is warranted as the patient has had CTs multiple times, which do not show any acute pathology.  She has normal labs today.    Patient comorbidities: see HPI    Data reviewed: Nursing notes, vital signs, prior visits, labs, imaging     External chart review performed: Patient is in the ED several times a month for similar symptoms.  She was in various ED 6 times in March, 4 times in February, and 11 times in  January.  She was seen 04/14/2022 at [facility].  She had a CT from 327 that showed no acute abnormalities of the abdomen pelvis.  Workup was essentially unremarkable at that time and she was discharged.    Tests and treatments considered: Considered CT.  Did not perform for reasons stated above.    Social determinants affecting health: alcohol use  and substance use     Shared decision making: Discussed all labs and imaging findings with patient. All  questions were answered and patient is in agreement with plan for  discharge    Severe exacerbation or progression of illness: Acute on chronic abdominal pain    Patient looks well, in no acute distress, and non-toxic appearing at time of discharge.   ED Course     ED Course as of 04/19/22 1829   Wed Apr 19, 2022   1016 Lipase: 25 [RP]   1016 Potassium(!): 5.6  Hemolysis- normal on POC chem 8. Renal functional normal.  [RP]      ED Course User Index  [RP] Fonda Kinderretko, Janaysha Depaulo L, DO         Final Diagnosis     1. Chronic abdominal pain        Disposition   DISPOSITION Decision To Discharge 04/19/2022 10:56:44 AM    Mendu, Reita ClicheShoba P, MD  [address]  [address]  [place] [id number]  [phone]    Schedule an appointment as soon as possible for a visit       [facility] EMERGENCY DEPT  [address] [place]  [place] [id number]  [phone]    If symptoms worsen    Discharge Medication List as of 04/19/2022 10:58 AM        START taking these medications    Details   HYDROcodone-acetaminophen (LORCET) 5-325 MG per tablet Take 1 tablet by mouth every 6 hours as needed for Pain for up to 3 days. Intended supply: 3 days. Take lowest dose possible to manage pain Max Daily Amount: 4 tablets, Disp-5 tablet, R-0Normal                 Judithann Saugerachel Taia Bramlett, DO  April 19, 2022    My signature above authenticates this document and my orders, the final    diagnosis (es), discharge prescription (s), and instructions in the Epic    record.  If you have any questions please  contact [phone].     Nursing notes have been reviewed by the physician/ advanced practice    Clinician.        Fonda Kinderretko, Pawan Knechtel L, DO  04/19/22 [phone]

## 2022-04-24 ENCOUNTER — Inpatient Hospital Stay
Admit: 2022-04-24 | Discharge: 2022-04-25 | Disposition: A | Discharge status: Home / Self Care | Payer: PRIVATE HEALTH INSURANCE

## 2022-04-24 DIAGNOSIS — R109 Unspecified abdominal pain: Secondary | ICD-10-CM

## 2022-04-24 DIAGNOSIS — G8929 Other chronic pain: Secondary | ICD-10-CM

## 2022-04-24 NOTE — ED Provider Notes (Signed)
Emergency Department Treatment Report        Patient: Rhonda Hammond Age: 32 y.o. Sex: female    Date of Birth: 1990-07-31 Admit Date: 04/24/2022 PCP: Rhonda Lawless, MD   MRN: [id number]  CSN: [id number]     Room: 110/EO10 Time Dictated: 8:12 PM            Chief Complaint   Chief Complaint   Patient presents with    Abdominal Pain       History of Present Illness   32 y.o. 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 ENDOSCOPY       Social History     Social History     Socioeconomic History    Marital status: Single     Spouse name: Not on file    Number of children: Not on file    Years of education: Not on file    Highest education level: Not on file   Occupational History    Not on file   Tobacco Use    Smoking status: Every Day     Types: Cigars    Smokeless tobacco: Not on file   Vaping Use    Vaping Use: Never used   Substance and Sexual Activity    Alcohol use: Yes     Alcohol/week: 21.0 standard drinks of alcohol     Types: 21 Shots of liquor per week     Comment: 2-3 drinks a day, sometimes more    Drug use: Yes     Frequency: 7.0 times per week     Types: Marijuana Sheran Fava)    Sexual activity: Not on file   Other Topics Concern    Not on file   Social History Narrative    Not on file      Social Determinants of Health     Financial Resource Strain: Not on file   Food Insecurity: No Food Insecurity (12/16/2021)    Hunger Vital Sign     Worried About Running Out of Food in the Last Year: Never true     Ran Out of Food in the Last Year: Never true   Transportation Needs: No Transportation Needs (12/16/2021)    PRAPARE - Therapist, art (Medical): No     Lack of Transportation (Non-Medical): No   Physical Activity: Not on file   Stress: Not on file   Social Connections: Not on file   Intimate Partner Violence: Not on file   Housing Stability: Low Risk  (12/16/2021)    Housing Stability Vital Sign  Unable to Pay for Housing in the Last Year: No     Number of Places Lived in the Last Year: 1     Unstable Housing in the Last Year: No       Family History     Family History   Problem Relation Age of Onset    No Known Problems Mother        Current Medications     No current facility-administered medications for this encounter.     Current Outpatient Medications   Medication Sig Dispense Refill    famotidine (PEPCID) 20 MG tablet Take 1 tablet by mouth 2 times daily 60 tablet 0    sucralfate (CARAFATE) 1 GM tablet Take 1 tablet by mouth 4 times daily for 14 days 56 tablet 0    ondansetron (ZOFRAN-ODT) 4 MG disintegrating tablet Take 1 tablet by mouth 3 times daily as needed for Nausea or Vomiting 21 tablet 0    albuterol sulfate HFA (PROVENTIL;VENTOLIN;PROAIR) 108 (90 Base) MCG/ACT inhaler Inhale 2 puffs into the lungs every 6 hours as needed for Wheezing 18 g 3    dicyclomine (BENTYL) 20 MG tablet Take 1 tablet by mouth 4 times daily for 10 days 40 tablet 0       Allergies     Allergies   Allergen Reactions    Droperidol Other (See Comments)     Face twitching, drooling and spontaneous leg movement       Physical Exam     ED Triage Vitals [04/24/22 1856]   BP Temp Temp Source Pulse Respirations SpO2 Height Weight - Scale   (!) 189/112 98.2 F (36.8 C) Oral 97 20 98 % 1.651 m  (5\' 5" ) 92.5 kg (204 lb)      Physical Exam  Vitals and nursing note reviewed.   Constitutional:       General: She is not in acute distress.     Appearance: She is well-developed. She is not ill-appearing, toxic-appearing or diaphoretic.   HENT:      Head: Normocephalic and atraumatic.   Eyes:      Extraocular Movements: Extraocular movements intact.   Cardiovascular:      Rate and Rhythm: Normal rate and regular rhythm.   Pulmonary:      Effort: Pulmonary effort is normal. No respiratory distress.      Breath sounds: Normal breath sounds.   Abdominal:      General: Bowel sounds are normal.      Palpations: Abdomen is soft.      Tenderness: There is abdominal tenderness (Epigastric, periumbilical).   Musculoskeletal:         General: Normal range of motion.      Cervical back: Normal range of motion.   Skin:     General: Skin is warm.   Neurological:      General: No focal deficit present.      Mental Status: She is alert.   Psychiatric:         Mood and Affect: Mood normal.           Impression and Management Plan     Check basic labs hold CT this is a chronic issue list unremarkable likely will not need any imaging study vital signs reassuring treat with fluids Zofran morphine    Differential diagnoses pancreatitis, abdominal pain, chronic pain, electrolyte abnormality    Diagnostic Studies   Lab:   No results found for any visits on 04/24/22.     Medications -  No data to display    Procedures    My interpretation of imaging shows N/A    EKG N/A    Telemetry N/A    Medical Decision Making/ED Course          Medical Decision Making  Discussed with patient chronic abdominal pain labs unremarkable vital signs reassuring received dose of morphine and Zofran here discharge home with Reglan Levsin and Phenergan she is to follow with her primary care provider she has appointment on the 19th she needs specialty referral which the last appointment she was unable to this is a chronic issue, this warrants continued narcotic  pain medications which based upon her prescription drug.  She has had multiple pains for this      Amount and/or Complexity of Data Reviewed  Labs: ordered.    Risk  Prescription drug management.        RECORD REVIEW:  I reviewed the patient[facility] and available outside facilities and note that  N/A    COMORBIDITIES impacting Evaluation and Management:  N/A    Severe exacerbation or progression of chronic illness: N/A    Threat to body function without evaluation and management:   N/A    Social Determinants impacting Evaluation and Management: N/A    Final Diagnosis       ICD-10-CM    1. Chronic abdominal pain  R10.9     G89.29            Disposition   Home    Ventura Leggitt Burgess EstelleLOUIS Dezaray Shibuya, MD  April 24, 2022  8:12 PM     *Portions of this electronic record were dictated using Dragon voice recognition software.  Unintended errors in translation may occur.    My signature above authenticates this document and my orders, the final    diagnosis (es), discharge prescription (s), and instructions in the Epic    record.  If you have any questions please contact [phone].     Nursing notes have been reviewed by the physician/ advanced practice    Clinician.               Janet BerlinJuliano, Nazaiah Navarrete Louis, MD  04/24/22 2251

## 2022-04-24 NOTE — ED Triage Notes (Signed)
Pt presents to the ED via EMS transport from home for eval of 24 hours of abdominal pain. Pt has a history of pancreatitis. Pt currently on her menstrual cycle.

## 2022-04-24 NOTE — ED Notes (Addendum)
1945 Dr Garnet Koyanagi at bedside for patient  assessment.    2047 pt medicated per order. Pt restless and nauseated. Yellow bile emesis. Will monitor effectiveness of medications    2140 pt restless and reporting that her pain is returning. Rating abdominal pain 7-8/10. NS bolus infusing. Dr Garnet Koyanagi updated on patient condition       Bynum Bellows, RN  04/24/22 2023       Bynum Bellows, RN  04/24/22 2053       Bynum Bellows, RN  04/24/22 2155

## 2022-04-24 NOTE — ED Notes (Signed)
Discharge instructions given to patient. Reviewed prescriptions. Verbalized understanding. Discharged home     Bynum Bellows, [place]  04/24/22 2308

## 2022-04-25 LAB — COMPREHENSIVE METABOLIC PANEL
ALT: 11 U/L (ref 10–49)
AST: 18 U/L (ref 0.0–33.9)
Albumin: 4.3 gm/dl (ref 3.4–5.0)
Alkaline Phosphatase: 68 U/L (ref 46–116)
Anion Gap: 7 mmol/L (ref 5–15)
BUN: 8 mg/dl — ABNORMAL LOW (ref 9–23)
CO2: 23 mEq/L (ref 20–31)
Calcium: 9.5 mg/dl (ref 8.7–10.4)
Chloride: 107 mEq/L (ref 98–107)
Creatinine: 0.66 mg/dl (ref 0.55–1.02)
GFR African American: >60
GFR Non-African American: >60
Glucose: 89 mg/dl (ref 74–106)
Potassium: 3.3 mEq/L — ABNORMAL LOW (ref 3.5–5.1)
Sodium: 137 mEq/L (ref 136–145)
Total Bilirubin: 0.8 mg/dl (ref 0.30–1.20)
Total Protein: 7.7 gm/dl (ref 5.7–8.2)

## 2022-04-25 LAB — POC CHEM 8
BUN: 7 mg/dl (ref 7–25)
Calcium, Ionized: 4.5 mg/dL (ref 4.40–5.40)
Chloride: 102 mEq/L (ref 98–107)
Creatinine: 0.4 mg/dl — ABNORMAL LOW (ref 0.6–1.3)
Glucose: 92 mg/dL (ref 74–106)
Hematocrit: 40 % (ref 38–45)
Hemoglobin: 13.6 gm/dl (ref 13.0–17.2)
Potassium: 3.3 mEq/L — ABNORMAL LOW (ref 3.5–4.9)
Sodium: 137 mEq/L (ref 136–145)
Total CO2: 24 mmol/L (ref 21–32)

## 2022-04-25 LAB — CBC WITH AUTO DIFFERENTIAL
Basophils: 0.3 % (ref 0–3)
Eosinophils: 2.3 % (ref 0–5)
Hematocrit: 30.9 % — ABNORMAL LOW (ref 35.0–47.0)
Hemoglobin: 10.9 gm/dl — ABNORMAL LOW (ref 11.0–16.0)
Immature Granulocytes %: 0.2 % (ref 0.0–3.0)
Lymphocytes: 25.7 % — ABNORMAL LOW (ref 28–48)
MCH: 26.8 pg (ref 25.4–34.6)
MCHC: 35.3 gm/dl (ref 30.0–36.0)
MCV: 75.9 fL — ABNORMAL LOW (ref 80.0–98.0)
MPV: 9.9 fL (ref 6.0–10.0)
Monocytes: 8.9 % (ref 1–13)
Neutrophils Segmented: 62.6 % (ref 34–64)
Nucleated RBCs: 1 — ABNORMAL HIGH (ref 0–0)
Platelets: 203 10*3/uL (ref 140–450)
RBC: 4.07 M/uL (ref 3.60–5.20)
RDW: 38.1 (ref 36.4–46.3)
WBC: 6.4 10*3/uL (ref 4.0–11.0)

## 2022-04-25 LAB — LIPASE: Lipase: 23 U/L (ref 12–53)

## 2022-04-25 MED ORDER — METOCLOPRAMIDE HCL 10 MG PO TABS
10 | ORAL_TABLET | Freq: Four times a day (QID) | ORAL | 3 refills | Status: AC
Start: 2022-04-25 — End: ?

## 2022-04-25 MED ORDER — ONDANSETRON HCL 4 MG/2ML IJ SOLN
4 | Freq: Once | INTRAMUSCULAR | Status: AC
Start: 2022-04-25 — End: 2022-04-24
  Administered 2022-04-25: 01:00:00 4 mg via INTRAVENOUS

## 2022-04-25 MED ORDER — MORPHINE SULFATE (PF) 4 MG/ML IV SOLN
4 | INTRAVENOUS | Status: AC
Start: 2022-04-25 — End: 2022-04-24
  Administered 2022-04-25: 01:00:00 8 mg via INTRAVENOUS

## 2022-04-25 MED ORDER — PROMETHAZINE HCL 25 MG PO TABS
25 | ORAL_TABLET | Freq: Four times a day (QID) | ORAL | 0 refills | Status: AC | PRN
Start: 2022-04-25 — End: 2022-05-01

## 2022-04-25 MED ORDER — HYOSCYAMINE SULFATE 0.125 MG PO TABS
125 | ORAL_TABLET | ORAL | 0 refills | Status: AC | PRN
Start: 2022-04-25 — End: ?

## 2022-04-25 MED ORDER — PANTOPRAZOLE SODIUM 20 MG PO TBEC
20 | ORAL_TABLET | Freq: Every day | ORAL | 0 refills | Status: AC
Start: 2022-04-25 — End: ?

## 2022-04-25 MED ORDER — SODIUM CHLORIDE 0.9 % IV BOLUS
0.9 | Freq: Once | INTRAVENOUS | Status: AC
Start: 2022-04-25 — End: 2022-04-24
  Administered 2022-04-25: 01:00:00 1000 mL via INTRAVENOUS

## 2022-04-25 MED ORDER — FAMOTIDINE (PF) 20 MG/2ML IV SOLN
20 | INTRAVENOUS | Status: AC
Start: 2022-04-25 — End: 2022-04-24
  Administered 2022-04-25: 02:00:00 20 mg via INTRAVENOUS

## 2022-04-25 MED FILL — MORPHINE SULFATE 4 MG/ML IJ SOLN: 4 mg/mL | INTRAMUSCULAR | Qty: 2

## 2022-04-25 MED FILL — FAMOTIDINE (PF) 20 MG/2ML IV SOLN: 20 MG/2ML | INTRAVENOUS | Qty: 2

## 2022-04-25 MED FILL — SODIUM CHLORIDE 0.9 % IV SOLN: 0.9 % | INTRAVENOUS | Qty: 1000

## 2022-04-25 MED FILL — ONDANSETRON HCL 4 MG/2ML IJ SOLN: 4 MG/2ML | INTRAMUSCULAR | Qty: 2

## 2022-04-26 ENCOUNTER — Inpatient Hospital Stay
Admit: 2022-04-26 | Discharge: 2022-04-26 | Disposition: A | Discharge status: Home / Self Care | Payer: PRIVATE HEALTH INSURANCE | Attending: Emergency Medicine

## 2022-04-26 DIAGNOSIS — G8929 Other chronic pain: Secondary | ICD-10-CM

## 2022-04-26 LAB — CBC WITH AUTO DIFFERENTIAL
Basophils: 0.1 % (ref 0–3)
Eosinophils: 2.5 % (ref 0–5)
Hematocrit: 29 % — ABNORMAL LOW (ref 35.0–47.0)
Hemoglobin: 10.1 gm/dl — ABNORMAL LOW (ref 11.0–16.0)
Immature Granulocytes %: 0.3 % (ref 0.0–3.0)
Lymphocytes: 18.1 % — ABNORMAL LOW (ref 28–48)
MCH: 26.6 pg (ref 25.4–34.6)
MCHC: 34.8 gm/dl (ref 30.0–36.0)
MCV: 76.5 fL — ABNORMAL LOW (ref 80.0–98.0)
MPV: 11.7 fL — ABNORMAL HIGH (ref 6.0–10.0)
Monocytes: 7.8 % (ref 1–13)
Neutrophils Segmented: 71.2 % — ABNORMAL HIGH (ref 34–64)
Nucleated RBCs: 0 (ref 0–0)
Platelets: 195 10*3/uL (ref 140–450)
RBC: 3.79 M/uL (ref 3.60–5.20)
RDW: 38.9 (ref 36.4–46.3)
WBC: 6.9 10*3/uL (ref 4.0–11.0)

## 2022-04-26 LAB — COMPREHENSIVE METABOLIC PANEL
ALT: 9 U/L — ABNORMAL LOW (ref 10–49)
AST: 18 U/L (ref 0.0–33.9)
Albumin: 3.4 gm/dl (ref 3.4–5.0)
Alkaline Phosphatase: 54 U/L (ref 46–116)
Anion Gap: 6 mmol/L (ref 5–15)
BUN: 6 mg/dl — ABNORMAL LOW (ref 9–23)
CO2: 24 mEq/L (ref 20–31)
Calcium: 8.1 mg/dl — ABNORMAL LOW (ref 8.7–10.4)
Chloride: 113 mEq/L — ABNORMAL HIGH (ref 98–107)
Creatinine: 0.53 mg/dl — ABNORMAL LOW (ref 0.55–1.02)
GFR African American: >60
GFR Non-African American: >60
Glucose: 85 mg/dl (ref 74–106)
Potassium: 2.9 mEq/L — CL (ref 3.5–5.1)
Sodium: 143 mEq/L (ref 136–145)
Total Bilirubin: 0.4 mg/dl (ref 0.30–1.20)
Total Protein: 6.5 gm/dl (ref 5.7–8.2)

## 2022-04-26 LAB — LIPASE: Lipase: 27 U/L (ref 12–53)

## 2022-04-26 MED ORDER — PROCHLORPERAZINE EDISYLATE 10 MG/2ML IJ SOLN
10 | Freq: Once | INTRAMUSCULAR | Status: AC
Start: 2022-04-26 — End: 2022-04-26
  Administered 2022-04-26: 11:00:00 10 mg via INTRAVENOUS

## 2022-04-26 MED ORDER — DIPHENHYDRAMINE HCL 50 MG/ML IJ SOLN
50 | INTRAMUSCULAR | Status: AC
Start: 2022-04-26 — End: 2022-04-26
  Administered 2022-04-26: 11:00:00 25 mg via INTRAVENOUS

## 2022-04-26 MED ORDER — SODIUM CHLORIDE 0.9 % IV BOLUS
0.9 | Freq: Once | INTRAVENOUS | Status: AC
Start: 2022-04-26 — End: 2022-04-26
  Administered 2022-04-26: 11:00:00 1000 mL via INTRAVENOUS

## 2022-04-26 MED ORDER — KETAMINE HCL 50 MG/ML IV SOSY
50 | INTRAVENOUS | Status: DC
Start: 2022-04-26 — End: 2022-04-26

## 2022-04-26 MED FILL — PROCHLORPERAZINE EDISYLATE 10 MG/2ML IJ SOLN: 10 MG/2ML | INTRAMUSCULAR | Qty: 2

## 2022-04-26 MED FILL — DIPHENHYDRAMINE HCL 50 MG/ML IJ SOLN: 50 MG/ML | INTRAMUSCULAR | Qty: 1

## 2022-04-26 NOTE — Progress Notes (Signed)
Pt arrived in room very tearful and stated 10/10 pain. C/o of abdominal pain in back and mid abdomen. A&O x 4. Labs obtained

## 2022-04-26 NOTE — Discharge Instructions (Signed)
You must follow-up with your primary care doctor and GI doctor asap.    Take your medications as prescribed  Do not smoke marijuana as this may be contributing to your nausea, vomiting, and abdominal pain.  Do not drink alcohol  Do not use cocaine

## 2022-04-26 NOTE — ED Provider Notes (Signed)
[facility]  Emergency Department Treatment Report        Patient: Rhonda Hammond Age: 32 y.o. Sex: female    Date of Birth: [birth year] Admit Date: 04/26/2022 PCP: Danny Lawless, MD   MRN: [id number]  CSN: [id number]     Room: ER39/ER39 Time Dictated: 7:43 AM        Chief Complaint   Chief Complaint   Patient presents with    Abdominal Pain    Emesis       History of Present Illness   This is a 32 y.o. female who presents c/o ongoing abdominal pain which is diffuse associated with intractable n/v.    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     Intractable nausea and vomiting 06/26/2020    Obesity 12/16/2018    Ovarian cyst     Pancreatitis      Past Surgical History:   Procedure Laterality Date    CESAREAN SECTION      CHOLECYSTECTOMY      UPPER GASTROINTESTINAL ENDOSCOPY N/A 10/12/2021    EGD DIAGNOSTIC performed by Elberta Spaniel, MD at [facility] ENDOSCOPY         Social History     Social History     Socioeconomic History    Marital status: Single     Spouse name: Not on file    Number of children: Not on file    Years of education: Not on file    Highest education level: Not on file   Occupational History    Not on file   Tobacco Use    Smoking status: Every Day     Types: Cigars    Smokeless tobacco: Not on file   Vaping Use    Vaping Use: Never used   Substance and Sexual Activity    Alcohol use: Yes     Alcohol/week: 21.0 standard drinks of alcohol     Types: 21 Shots of liquor per week     Comment: 2-3 drinks a day, sometimes more    Drug use: Yes     Frequency: 7.0 times per week     Types: Marijuana Sheran Fava)    Sexual activity: Not on file   Other  Topics Concern    Not on file   Social History Narrative    Not on file     Social Determinants of Health     Financial Resource Strain: Not on file   Food Insecurity: No Food Insecurity (12/16/2021)    Hunger Vital Sign     Worried About Running Out of Food in the Last Year: Never true     Ran Out of Food in the Last Year: Never true   Transportation Needs: No Transportation Needs (12/16/2021)    PRAPARE - Therapist, art (Medical): No     Lack of Transportation (Non-Medical): No   Physical Activity: Not on file   Stress: Not on file   Social Connections: Not on file   Intimate Partner Violence: Not on file  Housing Stability: Low Risk  (12/16/2021)    Housing Stability Vital Sign     Unable to Pay for Housing in the Last Year: No     Number of Places Lived in the Last Year: 1     Unstable Housing in the Last Year: No         Family History     Family History   Problem Relation Age of Onset    No Known Problems Mother          Current Medications     Current Facility-Administered Medications   Medication Dose Route Frequency Provider Last Rate Last Admin    ketamine (KETALAR) injection syringe 30 mg  0.3 mg/kg IntraVENous NOW Christiana Pellant, MD         Current Outpatient Medications   Medication Sig Dispense Refill    pantoprazole (PROTONIX) 20 MG tablet Take 1 tablet by mouth daily 30 tablet 0    metoclopramide (REGLAN) 10 MG tablet Take 1 tablet by mouth 4 times daily 120 tablet 3    promethazine (PHENERGAN) 25 MG tablet Take 1 tablet by mouth every 6 hours as needed for Nausea 20 tablet 0    hyoscyamine (LEVSIN) 125 MCG tablet Take 1 tablet by mouth every 4 hours as needed for Cramping 40 tablet 0    famotidine (PEPCID) 20 MG tablet Take 1 tablet by mouth 2 times daily 60 tablet 0    sucralfate (CARAFATE) 1 GM tablet Take 1 tablet by mouth 4 times daily for 14 days 56 tablet 0    ondansetron (ZOFRAN-ODT) 4 MG disintegrating tablet Take 1 tablet by mouth 3 times daily as needed for  Nausea or Vomiting 21 tablet 0    albuterol sulfate HFA (PROVENTIL;VENTOLIN;PROAIR) 108 (90 Base) MCG/ACT inhaler Inhale 2 puffs into the lungs every 6 hours as needed for Wheezing 18 g 3    dicyclomine (BENTYL) 20 MG tablet Take 1 tablet by mouth 4 times daily for 10 days 40 tablet 0       Allergies     Allergies   Allergen Reactions    Droperidol Other (See Comments)     Face twitching, drooling and spontaneous leg movement       Physical Exam   Patient Vitals for the past 24 hrs:   Temp Pulse Resp BP SpO2   04/26/22 0633 -- -- -- (!) 141/106 99 %   04/26/22 0620 97.7 F (36.5 C) 86 22 (!) 149/92 97 %   04/26/22 0610 98.3 F (36.8 C) (!) 115 18 (!) 160/82 100 %     Physical Exam  Constitutional:       Comments: Crying, anxious, pacing   HENT:      Head: Normocephalic.   Eyes:      General: No scleral icterus.  Cardiovascular:      Rate and Rhythm: Regular rhythm. Tachycardia present.   Abdominal:      Palpations: Abdomen is soft.      Tenderness: There is abdominal tenderness.   Skin:     General: Skin is warm and dry.   Neurological:      General: No focal deficit present.   Psychiatric:         Mood and Affect: Mood is anxious.          Impression and Management Plan     Differential diagnoses gastritis, chronic pancreatitis, cannabinoid induced hyperemesis, anxiety    Diagnostic Studies   Lab:   No results  found for any visits on 04/26/22.       EKG: My interpretation is that it shows       Prehospital EKG: My interpretation is that it shows       Cardiac Monitor Interpretation: My interpretation is that it shows       Imaging: Based on my personal interpretation, the    shows           ED Course         EXTERNAL RECORDS REVIEWED:  I reviewed the patient's previous records here at [facility] and available outside facilities and note that she was seen almost daily at [place] facilities in march of this year.      PREVIOUS RESULTS REVIEWED:  ct scan on 3/27 at [facility] showing nothing acute      INDEPENDENT HISTORIAN:   History and/or plan development assisted by: EMS    Severe exacerbation or progression of chronic illness: Severe exacerbation  of chronic n/v       Medications   ketamine (KETALAR) injection syringe 30 mg (has no administration in time range)   sodium chloride 0.9 % bolus 1,000 mL (0 mLs IntraVENous Stopped 04/26/22 0703)   prochlorperazine (COMPAZINE) injection 10 mg (10 mg IntraVENous Given 04/26/22 0631)   diphenhydrAMINE (BENADRYL) injection 25 mg (25 mg IntraVENous Given 04/26/22 0631)         NARRATIVE:  She was extremely anxious  Pacing  Complaining about severe intractable abdominal pain    She was given IV Compazine and Benadryl and IV fluids    She called me into the room to ask why she was not receiving nausea medications.  I explained that the Compazine and Benadryl were both for nausea    She then called me back to the room to ask why she was not receiving pain medicine    I indicated to her that I had ordered ketamine    She indicated that she wanted morphine or Dilaudid    I told her that I was not going to use opioids for this chronic pain    Moments after leaving the room she told the nurse that she did not want the ketamine.  She said that she wanted to go home    I met her in the hallway  She is now walking upright  She is no longer crying  She no longer seems to be afflicted with the pain or the nausea that she had moments prior    She indicated that if we were not going to give her pain medicine she was going to leave    I stressed the need to follow-up with her gastroenterologist and primary care physician    Medical Decision Making   I have answered all questions.  I have discussed my recommendations for follow-up and reasons to return.  I have encouraged early return should worrisome signs or symptoms develop or if there is any worsening or unanticipated persistence of their presenting condition.  I have discussed any limitations to their daily activities and the treatment plan for at  home.      Final Diagnosis       ICD-10-CM    1. Chronic abdominal pain  R10.9     G89.29       2. 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 Drug-seeking behavior  Z76.5       4. Intractable nausea and vomiting  R11.2  Disposition   home    Alayna Mabe A. Carmela Hurt, MD F-ACEP  April 26, 2022    My signature above authenticates this document and my orders, the final    diagnosis (es), discharge prescription (s), and instructions in the Epic    record.  If you have any questions please contact [phone].     Nursing notes have been reviewed by the physician/ advanced practice    Clinician.                             Christiana Pellant, MD  04/26/22 [phone]

## 2022-04-26 NOTE — ED Triage Notes (Signed)
Pt arrived via EMS c/o abdominal pain and vomiting since yesterday. Pt is tearful on arrival.

## 2022-04-26 NOTE — ED Notes (Cosign Needed)
LAB  Call from [facility] in lab. The patient has a Potassium result on CMP of 2.9. Dr Carmela Hurt made aware.     Sundra Aland [place], [place]  19/41/74 0814

## 2022-05-04 DIAGNOSIS — R109 Unspecified abdominal pain: Secondary | ICD-10-CM

## 2022-05-04 DIAGNOSIS — G8929 Other chronic pain: Secondary | ICD-10-CM

## 2022-05-04 NOTE — ED Provider Notes (Incomplete)
[facility]  Emergency Department Treatment Report    Patient: Rhonda Hammond Age: 32 y.o. Sex: female    Date of Birth: 12-10-1990 Admit Date: 05/04/2022 PCP: Danny Lawless, MD   MRN: [id number]  CSN: [id number]  Attending: Salomon Fick*   Room: H05/H05 Time Dictated: 11:56 PM APP: Shireen Quan, PA-C-C             Chief Complaint   Chief Complaint   Patient presents with   . Abdominal Pain        History of Present Illness     32 y.o. female with a past medical history of alcohol induced chronic pancreatitis, alcohol abuse, intractable nausea and vomiting presents to the emergency department with a 2-day worsening history of epigastric abdominal pain that radiates to her back, nausea and vomiting.  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  She has not been able to follow-up with a gastroenterologist.  She denies fever denies chills denies blood in her emesis.  She states her medications at home is not helping with her vomiting.  She does endorse having a drink yesterday.  She does state that her pain was present before her drink yesterday.      Review of Systems     Constitutional: No fever, chills, or weight loss  Eyes: No visual symptoms.  ENT: No sore throat, runny nose or ear pain.  Respiratory: No cough, dyspnea or wheezing.  Cardiovascular: No chest pain, pressure, palpitations, tightness or heaviness.  Gastrointestinal:  vomiting,  abdominal pain.  Genitourinary: No dysuria, frequency, or urgency.  Musculoskeletal: No joint pain or swelling.  Integumentary: No rashes.  Neurological: No headaches, sensory or motor symptoms.  Denies complaints in all other systems.      Past Medical/Surgical History     Past Medical History:   Diagnosis Date   . Alcohol abuse    . 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 Cocaine abuse (HCC) 06/2019   . Gastritis due  to alcohol without hemorrhage    . Intractable nausea and vomiting 06/26/2020   . Obesity 12/16/2018   . Ovarian cyst    . Pancreatitis      Past Surgical History:   Procedure Laterality Date   . CESAREAN SECTION     . CHOLECYSTECTOMY     . UPPER GASTROINTESTINAL ENDOSCOPY N/A 10/12/2021    EGD DIAGNOSTIC performed by Elberta Spaniel, MD at [facility] ENDOSCOPY         Social History     Social History     Socioeconomic History   . Marital status: Single   Tobacco Use   . Smoking status: Every Day     Types: Cigars   Vaping Use   . Vaping Use: Never used   Substance and Sexual Activity   . Alcohol use: Yes     Alcohol/week: 21.0 standard drinks of alcohol     Types: 21 Shots of liquor per week     Comment: 2-3 drinks a day, sometimes more   . Drug use: Yes     Frequency: 7.0 times per week     Types: Marijuana Sheran Fava)     Social Determinants of Health     Food Insecurity: No Food Insecurity (12/16/2021)    Hunger Vital Sign    . Worried About Programme researcher, broadcasting/film/video in the Last  Year: Never true    . Ran Out of Food in the Last Year: Never true   Transportation Needs: No Transportation Needs (12/16/2021)    PRAPARE - Transportation    . Lack of Transportation (Medical): No    . Lack of Transportation (Non-Medical): No   Housing Stability: Low Risk  (12/16/2021)    Housing Stability Vital Sign    . Unable to Pay for Housing in the Last Year: No    . Number of Places Lived in the Last Year: 1    . Unstable Housing in the Last Year: No       Family History     Family History   Problem Relation Age of Onset   . No Known Problems Mother        Current Medications     Previous Medications    ALBUTEROL SULFATE HFA (PROVENTIL;VENTOLIN;PROAIR) 108 (90 BASE) MCG/ACT INHALER    Inhale 2 puffs into the lungs every 6 hours as needed for Wheezing    DICYCLOMINE (BENTYL) 20 MG TABLET    Take 1 tablet by mouth 4 times daily for 10 days    FAMOTIDINE (PEPCID) 20 MG TABLET    Take 1 tablet by mouth 2 times daily    HYOSCYAMINE (LEVSIN) 125 MCG TABLET     Take 1 tablet by mouth every 4 hours as needed for Cramping    METOCLOPRAMIDE (REGLAN) 10 MG TABLET    Take 1 tablet by mouth 4 times daily    ONDANSETRON (ZOFRAN-ODT) 4 MG DISINTEGRATING TABLET    Take 1 tablet by mouth 3 times daily as needed for Nausea or Vomiting    PANTOPRAZOLE (PROTONIX) 20 MG TABLET    Take 1 tablet by mouth daily    SUCRALFATE (CARAFATE) 1 GM TABLET    Take 1 tablet by mouth 4 times daily for 14 days         Allergies     Allergies   Allergen Reactions   . Droperidol Other (See Comments)     Face twitching, drooling and spontaneous leg movement       Physical Exam     ED Triage Vitals [05/04/22 2301]   Enc Vitals Group      BP (!) 148/76      Pulse 82      Respirations 16      Temp 98.4 F (36.9 C)      Temp Source Oral      SpO2 100 %      Weight - Scale 85.7 kg (189 lb)      Height 1.651 m ( )       Constitutional: Patient appears well developed and well nourished.  Patient is tearful pacing at hallway bed   HEENT: Conjunctiva clear.  PERRL. Marland Kitchen Surface of the pharynx, palate, and tongue are pink, moist and without lesions.  Neck: supple, non tender, symmetrical, no masses or JVD.   Respiratory: lungs clear to auscultation, nonlabored respirations. No tachypnea or accessory muscle use.  Cardiovascular: heart regular rate and rhythm without murmur rubs or gallops.    Distal pulses 2+ and equal bilaterally.  No peripheral edema.  Gastrointestinal:  Abdomen soft,tender with complaint of pain to palpation epigastrium. Marland Kitchen No peritoneal findings.   Musculoskeletal: Ext: Warm/well perfused, DP/PT/radial pulses palpable bilaterally, no bony tenderness, no obvious deformities or lesions.  Integumentary: warm and dry without rashes or lesions  Neurologic: alert and oriented, Sensation intact, motor strength equal and symmetric.  No  facial asymmetry or dysarthria.            Impression and Management Plan     31 year old afebrile nontoxic female who presents emergency department with complaints of  epigastric abdominal pain nausea vomiting with a known history of chronic pancreatitis alcohol induced and a history of pancreatic pseudocyst.  She complains of most recent pain exacerbation for the past 2 days with intractable nausea vomiting despite using antiemetics.    Differential diagnosis does include but not limited to choledocholithiasis, peptic ulcer disease, pancreatitis, gastritis.  Plan is to get labs IV fluids symptomatic management reevaluation after return labs.    Last EKG dated 02/17/2022 with normal sinus rhythm with a rate of 77 bpm PR interval 140 ms QRS duration 76 ms with QT intervals 368 ms QTc 416 ms I do think she is safe for antiemetic medication      Diagnostic Studies     Lab:   No results found for this or any previous visit (from the past 12 hour(s)).    Imaging:    No orders to display             ED Course/ Medical Decision Making      Medications   morphine injection 4 mg (has no administration in time range)   ondansetron (ZOFRAN) injection 4 mg (has no administration in time range)   sodium chloride 0.9 % bolus 1,000 mL (has no administration in time range)                 RECORDS REVIEWED:  I reviewed the patient's previous records here at [facility] and available outside facilities and note that patient evaluated 04/26/2022 for abdominal pain and emesis in this emergency department with unremarkable workup and discharged stable to home.    EXTERNAL RESULTS REVIEWED: CT of the abdomen pelvis with IV contrast dated 0327 2024 from [facility] facilities without acute abnormalities of the abdomen or pelvis with unchanged appearance of Axios stent.     INDEPENDENT HISTORIAN:  History and/or plan development assisted by: Patient    Severe exacerbation or progression of chronic illness: Chronic pancreatitis      Threat to body function without evaluation and management: Gastrointestinal      SOCIAL DETERMINANTS  impacting Evaluation and Management: Prior history cocaine abuse, alcohol abuse,  marijuana use      Comorbidities impacting Evaluation and Management: BMI 31.45, pancreatic pseudocyst, chronic pancreatitis      Critical Care Time (if necessary)       I am the first provider for this patient.     I reviewed the vital signs, available nursing notes, past medical history, past surgical history, family history and social history.      I have spoken to Salomon Fick* regarding this patient's care and we discussed Past medical history, physical exam, ER course, lab results, imaging results, disposition plan and treatment.    I considered the following testing, treatment, or disposition *** but decided not to pursue due to ***    I considered prescribing *** and ***     Final Diagnosis     No diagnosis found.      Disposition          Medication List        ASK your doctor about these medications      albuterol sulfate HFA 108 (90 Base) MCG/ACT inhaler  Commonly known as: PROVENTIL;VENTOLIN;PROAIR  Inhale 2 puffs into the lungs every 6 hours as needed for Wheezing  dicyclomine 20 MG tablet  Commonly known as: BENTYL  Take 1 tablet by mouth 4 times daily for 10 days     famotidine 20 MG tablet  Commonly known as: Pepcid  Take 1 tablet by mouth 2 times daily     hyoscyamine 125 MCG tablet  Commonly known as: Levsin  Take 1 tablet by mouth every 4 hours as needed for Cramping     metoclopramide 10 MG tablet  Commonly known as: Reglan  Take 1 tablet by mouth 4 times daily     ondansetron 4 MG disintegrating tablet  Commonly known as: ZOFRAN-ODT  Take 1 tablet by mouth 3 times daily as needed for Nausea or Vomiting     pantoprazole 20 MG tablet  Commonly known as: Protonix  Take 1 tablet by mouth daily     sucralfate 1 GM tablet  Commonly known as: Carafate  Take 1 tablet by mouth 4 times daily for 14 days                 Patient discharged stable to     [facility]  May 04, 2022        The patient was personally evaluated by myself and Salomon Fick* who agrees  with the above assessment and plan.    My signature above authenticates this document and my orders, the final    diagnosis (es), discharge prescription (s), and instructions in the Epic    record.  If you have any questions please contact [phone].     Nursing notes have been reviewed by the physician/ advanced practice    Clinician.  Dragon medical dictation software was used for portions of this report. Unintended voice recognition errors may occur.

## 2022-05-04 NOTE — ED Provider Notes (Cosign Needed)
[facility]  Emergency Department Treatment Report    Patient: Rhonda Hammond Age: 32 y.o. Sex: female    Date of Birth: Feb 26, 1990 Admit Date: 05/04/2022 PCP: Danny Lawless, MD   MRN: [id number]  CSN: [id number]  Attending: Salomon Fick*   Room: OTF/OTF Time Dictated: 3:27 AM APP: Shireen Quan, PA-C-C             Chief Complaint   Chief Complaint   Patient presents with    Abdominal Pain        History of Present Illness     32 y.o. female with a past medical history of alcohol induced chronic pancreatitis, alcohol abuse, intractable nausea and vomiting presents to the emergency department with a 2-day worsening history of epigastric abdominal pain that radiates to her back, nausea and vomiting.  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  She has not been able to follow-up with a gastroenterologist.  She denies fever denies chills denies blood in her emesis.  She states her medications at home is not helping with her vomiting.  She does endorse having a drink yesterday.  She does state that her pain was present before her drink yesterday.      Review of Systems     Constitutional: No fever, chills, or weight loss  Eyes: No visual symptoms.  ENT: No sore throat, runny nose or ear pain.  Respiratory: No cough, dyspnea or wheezing.  Cardiovascular: No chest pain, pressure, palpitations, tightness or heaviness.  Gastrointestinal:  vomiting,  abdominal pain.  Genitourinary: No dysuria, frequency, or urgency.  Musculoskeletal: No joint pain or swelling.  Integumentary: No rashes.  Neurological: No headaches, sensory or motor symptoms.  Denies complaints in all other systems.      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     Intractable nausea and vomiting 06/26/2020    Obesity 12/16/2018    Ovarian cyst     Pancreatitis      Past Surgical History:   Procedure Laterality Date    CESAREAN SECTION      CHOLECYSTECTOMY      UPPER GASTROINTESTINAL ENDOSCOPY N/A 10/12/2021    EGD DIAGNOSTIC performed by Elberta Spaniel, MD at [facility] ENDOSCOPY         Social History     Social History     Socioeconomic History    Marital status: Single   Tobacco Use    Smoking status: Every Day     Types: Cigars   Vaping Use    Vaping Use: Never used   Substance and Sexual Activity    Alcohol use: Yes     Alcohol/week: 21.0 standard drinks of alcohol     Types: 21 Shots of liquor per week     Comment: 2-3 drinks a day, sometimes more    Drug use: Yes     Frequency: 7.0 times per week     Types: Marijuana Sheran Fava)     Social Determinants of Health     Food Insecurity: No Food Insecurity (12/16/2021)    Hunger Vital Sign     Worried About Running Out of Food in the Last  Year: Never true     Ran Out of Food in the Last Year: Never true   Transportation Needs: No Transportation Needs (12/16/2021)    PRAPARE - Therapist, art (Medical): No     Lack of Transportation (Non-Medical): No   Housing Stability: Low Risk  (12/16/2021)    Housing Stability Vital Sign     Unable to Pay for Housing in the Last Year: No     Number of Places Lived in the Last Year: 1     Unstable Housing in the Last Year: No       Family History     Family History   Problem Relation Age of Onset    No Known Problems Mother        Current Medications     Discharge Medication List as of 05/05/2022  1:51 AM        CONTINUE these medications which have NOT CHANGED    Details   pantoprazole (PROTONIX) 20 MG tablet Take 1 tablet by mouth daily, Disp-30 tablet, R-0Normal      metoclopramide (REGLAN) 10 MG tablet Take 1 tablet by mouth 4 times daily, Disp-120 tablet, R-3Normal      hyoscyamine (LEVSIN) 125 MCG tablet Take 1 tablet by mouth every 4 hours as  needed for Cramping, Disp-40 tablet, R-0Normal      famotidine (PEPCID) 20 MG tablet Take 1 tablet by mouth 2 times daily, Disp-60 tablet, R-0Normal      sucralfate (CARAFATE) 1 GM tablet Take 1 tablet by mouth 4 times daily for 14 days, Disp-56 tablet, R-0Normal      ondansetron (ZOFRAN-ODT) 4 MG disintegrating tablet Take 1 tablet by mouth 3 times daily as needed for Nausea or Vomiting, Disp-21 tablet, R-0Normal      albuterol sulfate HFA (PROVENTIL;VENTOLIN;PROAIR) 108 (90 Base) MCG/ACT inhaler Inhale 2 puffs into the lungs every 6 hours as needed for Wheezing, Disp-18 g, R-3Normal               Allergies     Allergies   Allergen Reactions    Droperidol Other (See Comments)     Face twitching, drooling and spontaneous leg movement       Physical Exam     ED Triage Vitals [05/04/22 2301]   Enc Vitals Group      BP (!) 148/76      Pulse 82      Respirations 16      Temp 98.4 F (36.9 C)      Temp Source Oral      SpO2 100 %      Weight - Scale 85.7 kg (189 lb)      Height 1.651 m ( )       Constitutional: Patient appears well developed and well nourished.  Patient is tearful pacing at hallway bed   HEENT: Conjunctiva clear.  PERRL. Marland Kitchen Surface of the pharynx, palate, and tongue are pink, moist and without lesions.  Neck: supple, non tender, symmetrical, no masses or JVD.   Respiratory: lungs clear to auscultation, nonlabored respirations. No tachypnea or accessory muscle use.  Cardiovascular: heart regular rate and rhythm without murmur rubs or gallops.    Distal pulses 2+ and equal bilaterally.  No peripheral edema.  Gastrointestinal:  Abdomen soft,tender with complaint of pain to palpation epigastrium. Marland Kitchen No peritoneal findings.   Musculoskeletal: Ext: Warm/well perfused, DP/PT/radial pulses palpable bilaterally, no bony tenderness, no obvious deformities or lesions.  Integumentary: warm  and dry without rashes or lesions  Neurologic: alert and oriented, Sensation intact, motor strength equal and symmetric.  No  facial asymmetry or dysarthria.            Impression and Management Plan     32 year old afebrile nontoxic female who presents emergency department with complaints of epigastric abdominal pain nausea vomiting with a known history of chronic pancreatitis alcohol induced and a history of pancreatic pseudocyst.  She complains of most recent pain exacerbation for the past 2 days with intractable nausea vomiting despite using antiemetics.    Differential diagnosis does include but not limited to choledocholithiasis, peptic ulcer disease, pancreatitis, gastritis.  Plan is to get labs IV fluids symptomatic management reevaluation after return labs.    Last EKG dated 02/17/2022 with normal sinus rhythm with a rate of 77 bpm PR interval 140 ms QRS duration 76 ms with QT intervals 368 ms QTc 416 ms I do think she is safe for antiemetic medication      Diagnostic Studies     Lab:   Recent Results (from the past 12 hour(s))   CBC with Auto Differential    Collection Time: 05/05/22 12:25 AM   Result Value Ref Range    WBC 5.3 4.0 - 11.0 1000/mm3    RBC 4.33 3.60 - 5.20 M/uL    Hemoglobin 11.3 11.0 - 16.0 gm/dl    Hematocrit 16.1 (L) 35.0 - 47.0 %    MCV 75.3 (L) 80.0 - 98.0 fL    MCH 26.1 25.4 - 34.6 pg    MCHC 34.7 30.0 - 36.0 gm/dl    Platelets 096 045 - 450 1000/mm3    MPV 10.6 (H) 6.0 - 10.0 fL    RDW 38.3 36.4 - 46.3      Nucleated RBCs 0 0 - 0      Immature Granulocytes % 0.4 0.0 - 3.0 %    Neutrophils Segmented 49.4 34 - 64 %    Lymphocytes 34.3 28 - 48 %    Monocytes 12.1 1 - 13 %    Eosinophils 3.4 0 - 5 %    Basophils 0.4 0 - 3 %   CMP    Collection Time: 05/05/22 12:25 AM   Result Value Ref Range    Potassium 4.1 3.5 - 5.1 mEq/L    Chloride 110 (H) 98 - 107 mEq/L    Sodium 141 136 - 145 mEq/L    CO2 23 20 - 31 mEq/L    Glucose 88 74 - 106 mg/dl    BUN <5 (L) 9 - 23 mg/dl    Creatinine 4.09 8.11 - 1.02 mg/dl    GFR African American >60.0      GFR Non-African American >60      Calcium 9.4 8.7 - 10.4 mg/dl    Anion Gap  8 5 - 15 mmol/L    AST 31.0 0.0 - 33.9 U/L    ALT 13 10 - 49 U/L    Alkaline Phosphatase 63 46 - 116 U/L    Total Bilirubin 0.30 0.30 - 1.20 mg/dl    Total Protein 7.7 5.7 - 8.2 gm/dl    Albumin 4.0 3.4 - 5.0 gm/dl   Lipase    Collection Time: 05/05/22 12:25 AM   Result Value Ref Range    Lipase 25 12 - 53 U/L   POCT Urinalysis no Micro    Collection Time: 05/05/22  1:13 AM   Result Value Ref Range    Glucose, Ur  Negative NEGATIVE,Negative mg/dl    Bilirubin, Urine Negative NEGATIVE,Negative      Ketones, Urine Negative NEGATIVE,Negative mg/dl    Specific Gravity, Urine 1.015 1.005 - 1.030      Blood, Urine Negative NEGATIVE,Negative      pH, Urine 7.5 5 - 9      Protein, Urine 30 (A) NEGATIVE,Negative mg/dl    Urobilinogen, Urine 1.0 0.0 - 1.0 EU/dl    Nitrite, Urine Negative NEGATIVE,Negative      Leukocyte Esterase, Urine Negative NEGATIVE,Negative      Color, UA Yellow      Clarity, UA Slightly Cloudy     POC Pregnancy Urine Qual    Collection Time: 05/05/22  1:13 AM   Result Value Ref Range    Pregnancy, Urine negative NEGATIVE,Negative,negative         Imaging:    No orders to display             ED Course/ Medical Decision Making      Medications   metoclopramide (REGLAN) injection 10 mg (10 mg IntraVENous Not Given 05/05/22 0225)   morphine injection 4 mg (4 mg IntraVENous Given 05/05/22 0025)   ondansetron (ZOFRAN) injection 4 mg (4 mg IntraVENous Given 05/05/22 0025)   sodium chloride 0.9 % bolus 1,000 mL (0 mLs IntraVENous Stopped 05/05/22 0056)     Patient remained stable in the emergency department.  She did have return of CBC without a leukocytosis with a white blood cell count of 5.3 a hemoglobin 11.3 hematocrit 32.6.  Anemia as interpreted by myself.  Chemistry within normal limits with a lipase that was not 3 times is upper end of normal to suggest an acute pancreatitis.  Urinalysis was negative for leuks nitrite blood and pregnancy.  Findings were discussed with patient.  She had minimal improvement  with the first dose of morphine and Zofran and redosed with Reglan and offered Bentyl for her chronic abdominal pain.  She did decline.  She is to follow-up with GI physician for her chronic abdominal pain and we did discuss smoking cessation.  Patient will return to the ED if symptoms worsen chest pain shortness of breath Diffley breathing fever.            RECORDS REVIEWED:  I reviewed the patient's previous records here at [facility] and available outside facilities and note that patient evaluated 04/26/2022 for abdominal pain and emesis in this emergency department with unremarkable workup and discharged stable to home.    EXTERNAL RESULTS REVIEWED: CT of the abdomen pelvis with IV contrast dated 0327 2024 from [facility] facilities without acute abnormalities of the abdomen or pelvis with unchanged appearance of Axios stent.     INDEPENDENT HISTORIAN:  History and/or plan development assisted by: Patient    Severe exacerbation or progression of chronic illness: Chronic pancreatitis      Threat to body function without evaluation and management: Gastrointestinal      SOCIAL DETERMINANTS  impacting Evaluation and Management: Prior history cocaine abuse, alcohol abuse, marijuana use      Comorbidities impacting Evaluation and Management: BMI 31.45, pancreatic pseudocyst, chronic pancreatitis      Critical Care Time (if necessary)       I am the first provider for this patient.     I reviewed the vital signs, available nursing notes, past medical history, past surgical history, family history and social history.      I have spoken to Salomon Fick* regarding this patient's care and we discussed Past  medical history, physical exam, ER course, lab results, imaging results, disposition plan and treatment.        Final Diagnosis     1. Chronic abdominal pain    2. Nausea and vomiting, unspecified vomiting type          Disposition          Medication List        CHANGE how you take these medications      *  dicyclomine 20 MG tablet  Commonly known as: BENTYL  Take 1 tablet by mouth 4 times daily for 10 days  What changed: Another medication with the same name was added. Make sure you understand how and when to take each.     * dicyclomine 20 MG tablet  Commonly known as: BENTYL  Take 1 tablet by mouth 4 times daily  What changed: You were already taking a medication with the same name, and this prescription was added. Make sure you understand how and when to take each.           * This list has 2 medication(s) that are the same as other medications prescribed for you. Read the directions carefully, and ask your doctor or other care provider to review them with you.                ASK your doctor about these medications      albuterol sulfate HFA 108 (90 Base) MCG/ACT inhaler  Commonly known as: PROVENTIL;VENTOLIN;PROAIR  Inhale 2 puffs into the lungs every 6 hours as needed for Wheezing     famotidine 20 MG tablet  Commonly known as: Pepcid  Take 1 tablet by mouth 2 times daily     hyoscyamine 125 MCG tablet  Commonly known as: Levsin  Take 1 tablet by mouth every 4 hours as needed for Cramping     metoclopramide 10 MG tablet  Commonly known as: Reglan  Take 1 tablet by mouth 4 times daily     ondansetron 4 MG disintegrating tablet  Commonly known as: ZOFRAN-ODT  Take 1 tablet by mouth 3 times daily as needed for Nausea or Vomiting     pantoprazole 20 MG tablet  Commonly known as: Protonix  Take 1 tablet by mouth daily     sucralfate 1 GM tablet  Commonly known as: Carafate  Take 1 tablet by mouth 4 times daily for 14 days               Where to Get Your Medications        These medications were sent to [id number] [place], [place] - [address] - P [phone] Carmon Ginsberg [phone]  [address], [place] [address]      Phone: [phone]   dicyclomine 20 MG tablet            Patient discharged stable to home.  Follow-up with primary care physician and GI physician.  Cease drinking.  Return to the  ED if symptoms this or worsen    Rita Ohara  May 04, 2022        The patient was personally evaluated by myself and Santiago-Rodriguez, Katherine Basset* who agrees with the above assessment and plan.    My signature above authenticates this document and my orders, the final    diagnosis (es), discharge prescription (s), and instructions in the Epic    record.  If you have any questions please contact [phone].     Nursing  notes have been reviewed by the physician/ advanced practice    Clinician.  Dragon medical dictation software was used for portions of this report. Unintended voice recognition errors may occur.                       Shireen Quan, [place]  05/05/22 [phone]

## 2022-05-04 NOTE — ED Triage Notes (Signed)
PT c/o mid abd pain, hx of pancreatitis

## 2022-05-05 ENCOUNTER — Inpatient Hospital Stay
Admit: 2022-05-05 | Discharge: 2022-05-05 | Disposition: A | Discharge status: Home / Self Care | Payer: PRIVATE HEALTH INSURANCE | Attending: Emergency Medicine

## 2022-05-05 LAB — CBC WITH AUTO DIFFERENTIAL
Basophils: 0.4 % (ref 0–3)
Eosinophils: 3.4 % (ref 0–5)
Hematocrit: 32.6 % — ABNORMAL LOW (ref 35.0–47.0)
Hemoglobin: 11.3 gm/dl (ref 11.0–16.0)
Immature Granulocytes %: 0.4 % (ref 0.0–3.0)
Lymphocytes: 34.3 % (ref 28–48)
MCH: 26.1 pg (ref 25.4–34.6)
MCHC: 34.7 gm/dl (ref 30.0–36.0)
MCV: 75.3 fL — ABNORMAL LOW (ref 80.0–98.0)
MPV: 10.6 fL — ABNORMAL HIGH (ref 6.0–10.0)
Monocytes: 12.1 % (ref 1–13)
Neutrophils Segmented: 49.4 % (ref 34–64)
Nucleated RBCs: 0 (ref 0–0)
Platelets: 203 10*3/uL (ref 140–450)
RBC: 4.33 M/uL (ref 3.60–5.20)
RDW: 38.3 (ref 36.4–46.3)
WBC: 5.3 10*3/uL (ref 4.0–11.0)

## 2022-05-05 LAB — COMPREHENSIVE METABOLIC PANEL
ALT: 13 U/L (ref 10–49)
AST: 31 U/L (ref 0.0–33.9)
Albumin: 4 gm/dl (ref 3.4–5.0)
Alkaline Phosphatase: 63 U/L (ref 46–116)
Anion Gap: 8 mmol/L (ref 5–15)
BUN: <5 mg/dl — ABNORMAL LOW (ref 9–23)
CO2: 23 mEq/L (ref 20–31)
Calcium: 9.4 mg/dl (ref 8.7–10.4)
Chloride: 110 mEq/L — ABNORMAL HIGH (ref 98–107)
Creatinine: 0.58 mg/dl (ref 0.55–1.02)
GFR African American: >60
GFR Non-African American: >60
Glucose: 88 mg/dl (ref 74–106)
Potassium: 4.1 mEq/L (ref 3.5–5.1)
Sodium: 141 mEq/L (ref 136–145)
Total Bilirubin: 0.3 mg/dl (ref 0.30–1.20)
Total Protein: 7.7 gm/dl (ref 5.7–8.2)

## 2022-05-05 LAB — POCT URINALYSIS DIPSTICK
Bilirubin, Urine: NEGATIVE
Blood, Urine: NEGATIVE
Glucose, Ur: NEGATIVE mg/dl
Ketones, Urine: NEGATIVE mg/dl
Leukocyte Esterase, Urine: NEGATIVE
Nitrite, Urine: NEGATIVE
Protein, Urine: 30 mg/dl — AB
Specific Gravity, Urine: 1.015 (ref 1.005–1.030)
Urobilinogen, Urine: 1 EU/dl (ref 0.0–1.0)
pH, Urine: 7.5 (ref 5–9)

## 2022-05-05 LAB — POC PREGNANCY UR-QUAL: Pregnancy, Urine: NEGATIVE

## 2022-05-05 LAB — LIPASE: Lipase: 25 U/L (ref 12–53)

## 2022-05-05 MED ORDER — MORPHINE SULFATE 4 MG/ML IJ SOLN
4 | INTRAMUSCULAR | Status: AC
Start: 2022-05-05 — End: 2022-05-05
  Administered 2022-05-05: 04:00:00 4 mg via INTRAVENOUS

## 2022-05-05 MED ORDER — SODIUM CHLORIDE 0.9 % IV BOLUS
0.9 | Freq: Once | INTRAVENOUS | Status: AC
Start: 2022-05-05 — End: 2022-05-05
  Administered 2022-05-05: 04:00:00 1000 mL via INTRAVENOUS

## 2022-05-05 MED ORDER — ONDANSETRON HCL 4 MG/2ML IJ SOLN
42 MG/2ML | Freq: Once | INTRAMUSCULAR | Status: AC
Start: 2022-05-05 — End: 2022-05-05
  Administered 2022-05-05: 04:00:00 4 mg via INTRAVENOUS

## 2022-05-05 MED ORDER — METOCLOPRAMIDE HCL 5 MG/ML IJ SOLN
5 | INTRAMUSCULAR | Status: DC
Start: 2022-05-05 — End: 2022-05-05

## 2022-05-05 MED ORDER — DICYCLOMINE HCL 20 MG PO TABS
20 MG | ORAL_TABLET | Freq: Four times a day (QID) | ORAL | 0 refills | Status: DC
Start: 2022-05-05 — End: 2023-02-07

## 2022-05-05 MED FILL — MORPHINE SULFATE 4 MG/ML IJ SOLN: 4 mg/mL | INTRAMUSCULAR | Qty: 1

## 2022-05-05 MED FILL — METOCLOPRAMIDE HCL 5 MG/ML IJ SOLN: 5 MG/ML | INTRAMUSCULAR | Qty: 2

## 2022-05-05 MED FILL — ONDANSETRON HCL 4 MG/2ML IJ SOLN: 4 MG/2ML | INTRAMUSCULAR | Qty: 2

## 2022-05-05 NOTE — Discharge Instructions (Addendum)
Medications for nausea and vomiting    Oral bentyl for colicky abdominal pain    Follow up with GI physician and your primary care physician for your chronic abdominal pain    Return to ER if fever    Results for orders placed or performed during the hospital encounter of 05/04/22   CBC with Auto Differential   Result Value Ref Range    WBC 5.3 4.0 - 11.0 1000/mm3    RBC 4.33 3.60 - 5.20 M/uL    Hemoglobin 11.3 11.0 - 16.0 gm/dl    Hematocrit 16.1 (L) 35.0 - 47.0 %    MCV 75.3 (L) 80.0 - 98.0 fL    MCH 26.1 25.4 - 34.6 pg    MCHC 34.7 30.0 - 36.0 gm/dl    Platelets 096 045 - 450 1000/mm3    MPV 10.6 (H) 6.0 - 10.0 fL    RDW 38.3 36.4 - 46.3      Nucleated RBCs 0 0 - 0      Immature Granulocytes % 0.4 0.0 - 3.0 %    Neutrophils Segmented 49.4 34 - 64 %    Lymphocytes 34.3 28 - 48 %    Monocytes 12.1 1 - 13 %    Eosinophils 3.4 0 - 5 %    Basophils 0.4 0 - 3 %    Narrative     CRITICAL VALUE   CMP   Result Value Ref Range    Potassium 4.1 3.5 - 5.1 mEq/L    Chloride 110 (H) 98 - 107 mEq/L    Sodium 141 136 - 145 mEq/L    CO2 23 20 - 31 mEq/L    Glucose 88 74 - 106 mg/dl    BUN <5 (L) 9 - 23 mg/dl    Creatinine 4.09 8.11 - 1.02 mg/dl    GFR African American >60.0      GFR Non-African American >60      Calcium 9.4 8.7 - 10.4 mg/dl    Anion Gap 8 5 - 15 mmol/L    AST 31.0 0.0 - 33.9 U/L    ALT 13 10 - 49 U/L    Alkaline Phosphatase 63 46 - 116 U/L    Total Bilirubin 0.30 0.30 - 1.20 mg/dl    Total Protein 7.7 5.7 - 8.2 gm/dl    Albumin 4.0 3.4 - 5.0 gm/dl   Lipase   Result Value Ref Range    Lipase 25 12 - 53 U/L   POCT Urinalysis no Micro   Result Value Ref Range    Glucose, Ur Negative NEGATIVE,Negative mg/dl    Bilirubin, Urine Negative NEGATIVE,Negative      Ketones, Urine Negative NEGATIVE,Negative mg/dl    Specific Gravity, Urine 1.015 1.005 - 1.030      Blood, Urine Negative NEGATIVE,Negative      pH, Urine 7.5 5 - 9      Protein, Urine 30 (A) NEGATIVE,Negative mg/dl    Urobilinogen, Urine 1.0 0.0 - 1.0 EU/dl     Nitrite, Urine Negative NEGATIVE,Negative      Leukocyte Esterase, Urine Negative NEGATIVE,Negative      Color, UA Yellow      Clarity, UA Slightly Cloudy     POC Pregnancy Urine Qual   Result Value Ref Range    Pregnancy, Urine negative NEGATIVE,Negative,negative

## 2022-05-05 NOTE — ED Notes (Signed)
2:25 AM  05/05/22     Discharge instructions given to [facility] (name) with verbalization of understanding. Patient accompanied by patient.  Patient discharged with the following prescriptions      Medication List        CHANGE how you take these medications      * dicyclomine 20 MG tablet  Commonly known as: BENTYL  Take 1 tablet by mouth 4 times daily for 10 days  What changed: Another medication with the same name was added. Make sure you understand how and when to take each.     * dicyclomine 20 MG tablet  Commonly known as: BENTYL  Take 1 tablet by mouth 4 times daily  What changed: You were already taking a medication with the same name, and this prescription was added. Make sure you understand how and when to take each.           * This list has 2 medication(s) that are the same as other medications prescribed for you. Read the directions carefully, and ask your doctor or other care provider to review them with you.                ASK your doctor about these medications      albuterol sulfate HFA 108 (90 Base) MCG/ACT inhaler  Commonly known as: PROVENTIL;VENTOLIN;PROAIR  Inhale 2 puffs into the lungs every 6 hours as needed for Wheezing     famotidine 20 MG tablet  Commonly known as: Pepcid  Take 1 tablet by mouth 2 times daily     hyoscyamine 125 MCG tablet  Commonly known as: Levsin  Take 1 tablet by mouth every 4 hours as needed for Cramping     metoclopramide 10 MG tablet  Commonly known as: Reglan  Take 1 tablet by mouth 4 times daily     ondansetron 4 MG disintegrating tablet  Commonly known as: ZOFRAN-ODT  Take 1 tablet by mouth 3 times daily as needed for Nausea or Vomiting     pantoprazole 20 MG tablet  Commonly known as: Protonix  Take 1 tablet by mouth daily     sucralfate 1 GM tablet  Commonly known as: Carafate  Take 1 tablet by mouth 4 times daily for 14 days               Where to Get Your Medications        These [facility] to [id number] [place], [place] - [address] - P [phone] Carmon Ginsberg [phone]  [address], [place] [address]      Phone: [phone]   dicyclomine 20 MG tablet      . Patient discharged to Home (destination).      Gean Maidens, RN      Vicke Plotner, Inda Merlin, RN  05/05/22 [phone]

## 2022-05-05 NOTE — ED Notes (Signed)
2315: Assumed care of pt & report received from [place], [place]. Pt currently c/o severe abdominal pain, rocking back and forth, tearful.    2325: line & labs completed. X2 attempts w/ 2nd attempt successful. Pt tolerated fairly.   Medicated per orders. Tolerated well.    0115: urine dipped & sent    0145: Gave report to Elisa, RN. Questions answered.        Earl Lites, RN  05/05/22 [phone]

## 2022-05-05 NOTE — ED Notes (Signed)
Pt refused medication and requested d/c paperwork  Paperwork provided, PIV removed       Gean Maidens, RN  05/05/22 0225

## 2022-05-11 ENCOUNTER — Inpatient Hospital Stay: Admit: 2022-05-11 | Discharge: 2022-05-11 | Payer: PRIVATE HEALTH INSURANCE | Attending: Emergency Medicine

## 2022-05-11 DIAGNOSIS — R1013 Epigastric pain: Secondary | ICD-10-CM

## 2022-05-11 LAB — CBC WITH AUTO DIFFERENTIAL
Basophils: 0.5 % (ref 0–3)
Eosinophils: 3.4 % (ref 0–5)
Hematocrit: 32.2 % — ABNORMAL LOW (ref 35.0–47.0)
Hemoglobin: 11.6 gm/dl (ref 11.0–16.0)
Immature Granulocytes %: 0.2 % (ref 0.0–3.0)
Lymphocytes: 40.9 % (ref 28–48)
MCH: 27 pg (ref 25.4–34.6)
MCHC: 36 gm/dl (ref 30.0–36.0)
MCV: 75.1 fL — ABNORMAL LOW (ref 80.0–98.0)
MPV: 11.9 fL — ABNORMAL HIGH (ref 6.0–10.0)
Monocytes: 9.3 % (ref 1–13)
Neutrophils Segmented: 45.7 % (ref 34–64)
Nucleated RBCs: 0 (ref 0–0)
Platelets: 247 10*3/uL (ref 140–450)
RBC: 4.29 M/uL (ref 3.60–5.20)
RDW: 37.8 (ref 36.4–46.3)
WBC: 6.1 10*3/uL (ref 4.0–11.0)

## 2022-05-11 LAB — COMPREHENSIVE METABOLIC PANEL
ALT: 9 U/L — ABNORMAL LOW (ref 10–49)
AST: 31 U/L (ref 0.0–33.9)
Albumin: 4 gm/dl (ref 3.4–5.0)
Alkaline Phosphatase: 56 U/L (ref 46–116)
Anion Gap: 9 mmol/L (ref 5–15)
BUN: 6 mg/dl — ABNORMAL LOW (ref 9–23)
CO2: 21 mEq/L (ref 20–31)
Calcium: 9.3 mg/dl (ref 8.7–10.4)
Chloride: 110 mEq/L — ABNORMAL HIGH (ref 98–107)
Creatinine: 0.64 mg/dl (ref 0.55–1.02)
GFR African American: >60
GFR Non-African American: >60
Glucose: 90 mg/dl (ref 74–106)
Potassium: 4.1 mEq/L (ref 3.5–5.1)
Sodium: 140 mEq/L (ref 136–145)
Total Bilirubin: 0.5 mg/dl (ref 0.30–1.20)
Total Protein: 7.3 gm/dl (ref 5.7–8.2)

## 2022-05-11 LAB — POC CHEM 8
BUN: 6 mg/dl — ABNORMAL LOW (ref 7–25)
Calcium, Ionized: 4.9 mg/dL (ref 4.40–5.40)
Chloride: 105 mEq/L (ref 98–107)
Creatinine: 0.5 mg/dl — ABNORMAL LOW (ref 0.6–1.3)
Glucose: 96 mg/dL (ref 74–106)
Hematocrit: 42 % (ref 38–45)
Hemoglobin: 14.3 gm/dl (ref 13.0–17.2)
Potassium: 4.5 mEq/L (ref 3.5–4.9)
Sodium: 140 mEq/L (ref 136–145)
Total CO2: 25 mmol/L (ref 21–32)

## 2022-05-11 LAB — LIPASE: Lipase: 26 U/L (ref 12–53)

## 2022-05-11 MED ORDER — DIPHENHYDRAMINE HCL 50 MG/ML IJ SOLN
50 | Freq: Four times a day (QID) | INTRAMUSCULAR | Status: DC | PRN
Start: 2022-05-11 — End: 2022-05-11

## 2022-05-11 MED ORDER — FAMOTIDINE (PF) 20 MG/2ML IV SOLN
202 MG/2ML | INTRAVENOUS | Status: AC
Start: 2022-05-11 — End: 2022-05-11
  Administered 2022-05-11: 08:00:00 20 mg via INTRAVENOUS

## 2022-05-11 MED ORDER — PROCHLORPERAZINE EDISYLATE 10 MG/2ML IJ SOLN
10 | Freq: Once | INTRAMUSCULAR | Status: AC
Start: 2022-05-11 — End: 2022-05-11
  Administered 2022-05-11: 08:00:00 5 mg via INTRAVENOUS

## 2022-05-11 MED FILL — PROCHLORPERAZINE EDISYLATE 10 MG/2ML IJ SOLN: 10 MG/2ML | INTRAMUSCULAR | Qty: 2

## 2022-05-11 MED FILL — FAMOTIDINE (PF) 20 MG/2ML IV SOLN: 20 MG/2ML | INTRAVENOUS | Qty: 2

## 2022-05-11 NOTE — ED Triage Notes (Signed)
Patient arrives via ems form home with complaint of abdominal pain.     HX - pancreatitis     Feels like pancreatitis

## 2022-05-11 NOTE — ED Notes (Signed)
Pt eloped. No discharge papers were given before pt left. Pt stable and ambulated w/ no assistance out. Pt to [facility].     Earnstine Regal, RN  05/11/22 0430

## 2022-05-11 NOTE — ED Provider Notes (Signed)
[facility]  Emergency Department Treatment Report    Patient: JOEANN STEPPE Age: 32 y.o. Sex: female    Date of Birth: January 22, 1990 Admit Date: 05/11/2022 PCP: Danny Lawless, MD   MRN: [id number]  CSN: [id number]  ED Attending: Dianna Rossetti, MD     Room: OTF/OTF Time Dictated: 8:21 AM    (Note to patient:The purpose of this note is to communicate optimally with the other providers involved in your care. It is written using standard medical terminology. The Dragon dictation tool was used in preparation of this note, so please excuse typos/grammatical)    Patient information was obtained from: patient  History/Exam limitations: none  Patient presented to the Emergency Department: private vehicle      Chief Complaint   Chief Complaint   Patient presents with    Pancreatitis       History of Present Illness   32 y.o. female with past medical history of alcohol induced pancreatitis, cocaine abuse, gastritis, tractable nausea and vomiting, ovarian cyst who presents with epigastric pain with vomiting.  Patient feels like this pain is similar to her prior pancreatitis.    No fevers or chills.  No alcohol intake.    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     Intractable nausea and vomiting 06/26/2020    Obesity 12/16/2018    Ovarian cyst     Pancreatitis      Past Surgical History:   Procedure Laterality Date    CESAREAN SECTION      CHOLECYSTECTOMY      UPPER GASTROINTESTINAL ENDOSCOPY N/A 10/12/2021    EGD DIAGNOSTIC performed by Elberta Spaniel, MD at [facility] ENDOSCOPY     Social History     Social History     Socioeconomic History    Marital status: Single     Spouse name: Not on file    Number of children: Not on file    Years of education: Not on file    Highest education level: Not on  file   Occupational History    Not on file   Tobacco Use    Smoking status: Every Day     Types: Cigars    Smokeless tobacco: Not on file   Vaping Use    Vaping Use: Never used   Substance and Sexual Activity    Alcohol use: Yes     Alcohol/week: 21.0 standard drinks of alcohol     Types: 21 Shots of liquor per week     Comment: 2-3 drinks a day, sometimes more    Drug use: Yes     Frequency: 7.0 times per week     Types: Marijuana Sheran Fava)    Sexual activity: Not on file   Other Topics Concern    Not on file   Social History Narrative    Not on file     Social Determinants of Health     Financial Resource Strain: Not on file   Food Insecurity: No Food Insecurity (12/16/2021)    Hunger Vital Sign     Worried About Running Out of Food in the Last Year: Never true     Ran Out of Food in the Last  Year: Never true   Transportation Needs: No Transportation Needs (12/16/2021)    PRAPARE - Therapist, art (Medical): No     Lack of Transportation (Non-Medical): No   Physical Activity: Not on file   Stress: Not on file   Social Connections: Not on file   Intimate Partner Violence: Not on file   Housing Stability: Low Risk  (12/16/2021)    Housing Stability Vital Sign     Unable to Pay for Housing in the Last Year: No     Number of Places Lived in the Last Year: 1     Unstable Housing in the Last Year: No     Family History     Family History   Problem Relation Age of Onset    No Known Problems Mother      Current Medications     No current facility-administered medications for this encounter.     Current Outpatient Medications   Medication Sig Dispense Refill    dicyclomine (BENTYL) 20 MG tablet Take 1 tablet by mouth 4 times daily 24 tablet 0    pantoprazole (PROTONIX) 20 MG tablet Take 1 tablet by mouth daily 30 tablet 0    metoclopramide (REGLAN) 10 MG tablet Take 1 tablet by mouth 4 times daily 120 tablet 3    hyoscyamine (LEVSIN) 125 MCG tablet Take 1 tablet by mouth every 4 hours as needed for  Cramping 40 tablet 0    famotidine (PEPCID) 20 MG tablet Take 1 tablet by mouth 2 times daily 60 tablet 0    sucralfate (CARAFATE) 1 GM tablet Take 1 tablet by mouth 4 times daily for 14 days 56 tablet 0    ondansetron (ZOFRAN-ODT) 4 MG disintegrating tablet Take 1 tablet by mouth 3 times daily as needed for Nausea or Vomiting 21 tablet 0    albuterol sulfate HFA (PROVENTIL;VENTOLIN;PROAIR) 108 (90 Base) MCG/ACT inhaler Inhale 2 puffs into the lungs every 6 hours as needed for Wheezing 18 g 3    dicyclomine (BENTYL) 20 MG tablet Take 1 tablet by mouth 4 times daily for 10 days 40 tablet 0     Allergies     Allergies   Allergen Reactions    Droperidol Other (See Comments)     Face twitching, drooling and spontaneous leg movement     Physical Exam   Patient Vitals for the past 24 hrs:   Temp Pulse Resp BP SpO2   05/11/22 0308 98.3 F (36.8 C) -- -- -- --   05/11/22 0300 -- 92 20 (!) 148/106 98 %       Constitutional: Patient appears well developed and well nourished.  Appearance and behavior are age and situation appropriate.  HEENT: Conjunctiva clear. Mucous membranes moist, non-erythematous. No stridor.  Neck: supple, non tender, symmetrical.   Gastrointestinal:  Abdomen soft, nontender without complaint of pain to palpation  Musculoskeletal: Joints no swelling   Integumentary: warm and dry without rashes or lesions  Neurologic: alert and oriented, Sensation intact, motor strength equal and symmetric.  No facial asymmetry or dysarthria.    Impression and Management Plan   Patient presents with acute epigastric pain and vomiting.      Differential diagnosis mainly considered is pancreatitis, gastritis, other intra-abdominal process.    Diagnostic Studies   Lab/Imaging:   Results for orders placed or performed during the hospital encounter of 05/11/22   CBC with Diff   Result Value Ref Range    WBC  6.1 4.0 - 11.0 1000/mm3    RBC 4.29 3.60 - 5.20 M/uL    Hemoglobin 11.6 11.0 - 16.0 gm/dl    Hematocrit 08.6 (L) 35.0 -  47.0 %    MCV 75.1 (L) 80.0 - 98.0 fL    MCH 27.0 25.4 - 34.6 pg    MCHC 36.0 30.0 - 36.0 gm/dl    Platelets 578 469 - 450 1000/mm3    MPV 11.9 (H) 6.0 - 10.0 fL    RDW 37.8 36.4 - 46.3      Nucleated RBCs 0 0 - 0      Immature Granulocytes % 0.2 0.0 - 3.0 %    Neutrophils Segmented 45.7 34 - 64 %    Lymphocytes 40.9 28 - 48 %    Monocytes 9.3 1 - 13 %    Eosinophils 3.4 0 - 5 %    Basophils 0.5 0 - 3 %    Narrative     CRITICAL VALUE   CMP   Result Value Ref Range    Potassium 4.1 3.5 - 5.1 mEq/L    Chloride 110 (H) 98 - 107 mEq/L    Sodium 140 136 - 145 mEq/L    CO2 21 20 - 31 mEq/L    Glucose 90 74 - 106 mg/dl    BUN 6 (L) 9 - 23 mg/dl    Creatinine 6.29 5.28 - 1.02 mg/dl    GFR African American >60.0      GFR Non-African American >60      Calcium 9.3 8.7 - 10.4 mg/dl    Anion Gap 9 5 - 15 mmol/L    AST 31.0 0.0 - 33.9 U/L    ALT 9 (L) 10 - 49 U/L    Alkaline Phosphatase 56 46 - 116 U/L    Total Bilirubin 0.50 0.30 - 1.20 mg/dl    Total Protein 7.3 5.7 - 8.2 gm/dl    Albumin 4.0 3.4 - 5.0 gm/dl   Lipase   Result Value Ref Range    Lipase 26 12 - 53 U/L   POC CHEM 8   Result Value Ref Range    Sodium 140 136 - 145 mEq/L    Potassium 4.5 3.5 - 4.9 mEq/L    Chloride 105 98 - 107 mEq/L    Total CO2 25 21 - 32 mmol/L    Glucose 96 74 - 106 mg/dL    BUN 6 (L) 7 - 25 mg/dl    Creatinine 0.5 (L) 0.6 - 1.3 mg/dl    Hematocrit 42 38 - 45 %    Hemoglobin 14.3 13.0 - 17.2 gm/dl    Calcium, Ionized 4.13 4.40 - 5.40 mg/dL      Labs Reviewed   CBC WITH AUTO DIFFERENTIAL - Abnormal; Notable for the following components:       Result Value    Hematocrit 32.2 (*)     MCV 75.1 (*)     MPV 11.9 (*)     All other components within normal limits    Narrative:      CRITICAL VALUE   COMPREHENSIVE METABOLIC PANEL - Abnormal; Notable for the following components:    Chloride 110 (*)     BUN 6 (*)     ALT 9 (*)     All other components within normal limits   POC CHEM 8 - Abnormal; Notable for the following components:    BUN 6 (*)      Creatinine 0.5 (*)  All other components within normal limits   LIPASE   POC CHEM 8       ED Course   RECORDS REVIEWED:  I reviewed the patient's previous records:  Review [facility] ED visits show   05/04/2022 [facility] ED for abdominal pain believed to be pancreatitis with normal lipase  04/26/2022 abdominal pain gastric believed to be pancreatitis-treated with Compazine Benadryl and IV fluids.  Offered ketamine for her pain but declined wanting morphine or Dilaudid and patient left.  04/24/2022-chronic pain treated with morphine 8, Pepcid 20  04/19/2022-chronic abdominal pain treated with Dilaudid 1 mg, Protonix 40 mg, dicyclomine 20 mg, Ativan 1 mg, hydrocodone acetaminophen orally  04/15/2022-chronic abdominal pain treated with morphine 4 mg, Zofran 4 mg, hydrocodone morphine 1 mg contra morphine 1 mg-normal lipase    EXTERNAL RESULTS REVIEWED:    05/05/2022 [facility]-abdominal pain believed pancreatitis labs stable with normal lipase.  Prescribed Percocet for home.    CT scan abdomen 01/08/2022 shows cholecystectomy and pancreatic stent placement.  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  Residual.  Pancreatic ductal dilatation stable no acute pancreatitis  CT abdomen 12/15/2021 pancreatitis.  I reviewed patient's labs elevated 134      Severe exacerbation or progression of chronic illness:abdominal pain  Threat to body function without evaluation and management:  concerns of pancreatitis  SOCIAL DETERMINANTS  impacting Evaluation and Management: NA  Comorbidities impacting Evaluation and Management: History of prior cocaine use    Treated with medications  Medications   prochlorperazine (COMPAZINE) injection 5 mg (5 mg IntraVENous Given 05/11/22 0400)   famotidine (PEPCID) 20 mg in sodium chloride (PF) 0.9 % 10 mL injection (20 mg IntraVENous Given 05/11/22 0401)       NARRATIVE:  Lipase normal at 26 send  Chemistries  stable  White count normal 6.1         Medical Decision Making   Patient presents epigastric pain with nausea vomiting    Patient had labs drawn this medicated.  Patient requesting pain medication and not satisfied with nonnarcotic medication attempts.  Patient states she has a PCP and her PCP is attempting to help her obtain a pain specialist.  I asked her if she was still taking Percocet and where she received them from, patient states that she usually gets them from the emergency department.  Patient states her PCP will not write for them.      I reviewed patient's prescription monitoring plan which showed on  05/05/2022 patient received 13 oxycodone acetaminophen from Dr Doristine Locks in [place],   04/19/2022 she received Hydrocodone acetaminophen at [facility], 04/15/2022 received 12 oxycodone acetaminophen at [facility]   04/13/2022 received 3 oxycodone 5 mg  04/05/2022 received 12 oxycodone 5 mg  03/23/2022 received 25 hydrocodone acetaminophen  03/16/2022 received 12 oxycodone acetaminophen  Patient is multiple providers and emergency departments prescribe her narcotic pain medications.    I discussed that we would treat her pain but trying to avoid narcotic pain medication and I be glad to do further diagnostic testing.  Patient did not want to stay if she did not receive narcotic pain medicine.  Patient's lipase had not returned yet when patient decided to leave.  Nurse took out her IV and reported patient seemed comfortable leaving without distress..        Final Diagnosis       ICD-10-CM    1. Abdominal pain, epigastric  R10.13  2. Drug-seeking behavior  Z76.5         Disposition   LWCS      Dianna Rossetti MD  May 11, 2022    My signature above authenticates this document and my orders, the final    diagnosis (es), discharge prescription (s), and instructions in the Epic    record.    Nursing notes have been reviewed by the physician.    Please note that portions of this note were completed with a voice recognition program  Dragon.  Efforts were made to edit the dictations but words may be mis-transcribed.     Dianna Rossetti, MD  05/11/22 [phone]

## 2022-05-11 NOTE — ED Notes (Signed)
Pt reported that she wanted to leave due feeling as if she was not getting the medications she desired. Hsu, MD made aware and went to see pt at bedside. Hsu, MD reported her labs was not back yet and would prefer her to stay however she insisted on leave. PIV taken out and wrapped w/ coband at this time.      Earnstine Regal, RN  05/11/22 [phone]

## 2022-06-22 NOTE — ED Provider Notes (Signed)
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                                             Supplemental Historians Include; : patient     ED Course:  Patient is afebrile and hemodynamically stable upon arrival and nontoxic during initial encounter.  2mg  morphine, 4 mg Zofran, IVF bolus ordered while awaiting work-up.  Given parenteral analgesic, antiemetic, IVF bolus while awaiting workup.     2:23 AM Still in pain, redosed with dilaudid x1.    CBC reveals mild microcytic anemia without leukocytosis/ -penia or thrombophilia/ -penia.  BMP shows no renal dysfunction, electrolyte abnormality, acid/base disorder, or anion gap.  LFTs unrevealing for signs of hepatic dysfunction or biliary disease.   Lipase WNL; pancreatitis less likely.  BAL 0.07. no active signs of withdrawal.     UA with ketonuria and otherwise contaminated.  Without LUTI symptoms, will defer antibiotics for now.    Urine pregnancy testing returned negative.    3:00 AM finished with final reassessment.  Patient states she is feeling better and is comfortable being discharged home and following up as an outpatient.  No emesis since arrival, and states her nausea is considerably improved, more than the pain.  Will discharge home with a short course of pain medications,  antacids, antiemetic.                      No orders to display       .     Social determinants : substance use    Discussion of Management with other Physicians, QHP or Appropriate Source; : None    .    Disposition:  Home    Discharge Medication List as of 06/22/2022  3:35 AM        Chief Complaint   Patient presents with   . ABDOMINAL PAIN   . VOMITING       Rhonda Hammond is a 32 y.o. female with a history of alcohol induced pancreatitis, who presents to the ED with complaints of epigastric abdominal pain, nausea vomiting.  This started this morning.  There was no associated setting/ trigger to which she can attribute the onset of her symptoms other than pancreatitis, being told this in the past.  Last drank alcohol the night prior.  States she does not drink every day and does not go into withdrawals if she does not drink.  The pain does seem to radiate toward her back.  It is constant in severity.  Direct pressure aggravates the pain.     The patient denies urinary complaints, and she does not have an extensive UTI history.  The patient denies flank pain or fevers.     Bowel movements have  been infrequent but with her last tonight.  The patient denies melena or BRBPR.     The patient denies vaginal complaints.  Her LMP was the 12th.     The patient denies further associated symptoms.     The patient has taken aleve ibuprofen and Tylenol in an attempt to make her symptoms improve, but vomits it back up.  In the past, she states she is responded well to Zofran and pain medications, but has been out of these for a long time.     The patient noted many similar episodes in the past.     Past abdominal surgical history: None.  No previous colonoscopy.  No previous relationship with gastroenterology.            Review of Systems:  Constitutional:  Negative for fever.   HENT:  Negative for neck pain.    Respiratory:  Negative for shortness of breath.    Cardiovascular:  Negative for chest pain.   Gastrointestinal:   Positive for nausea, vomiting and abdominal pain. Negative for diarrhea, constipation and blood in stool.   Genitourinary:  Negative for dysuria.   Musculoskeletal:  Negative for myalgias.   Skin:  Negative for rash.   Neurological:  Negative for headaches.   Hematological:  does not bruise/bleed easily.       Physical Exam  Vitals and nursing note reviewed.   Constitutional:       Appearance: She is not diaphoretic.      Comments: Awake and alert female appearing in mild obvious pain at rest.  No respiratory distress and speaks with a clear voice. Pleasant and appropriate in conversation.   HENT:      Head: Normocephalic and atraumatic.   Eyes:      General: No scleral icterus.     Conjunctiva/sclera: Conjunctivae normal.   Cardiovascular:      Rate and Rhythm: Normal rate and regular rhythm.      Heart sounds: Normal heart sounds.   Pulmonary:      Effort: Pulmonary effort is normal.      Breath sounds: Normal breath sounds.   Abdominal:      General: There is no distension.      Palpations: Abdomen is soft.      Comments: Normal-sounding percussion.  Tenderness to epigastrium and LUQ with no murphy's sign or McBurney's point tenderness present.  No peritoneal signs.   Musculoskeletal:      Cervical back: Neck supple.   Skin:     General: Skin is warm and dry.   Neurological:      Mental Status: She is alert.         Past Medical History:   Diagnosis Date   . Alcohol-induced acute pancreatitis     no longer drinks EtOH regularly   . Class 1 obesity with body mass index (BMI) of 32.0 to 32.9 in adult    . Gallstones     per pt report     Past Surgical History:   Procedure Laterality Date   . EGD WITH ULTRASOUND EXAMINATION N/A 01/04/2022    Procedure: EGD, WITH ENDOSCOPIC ULTRASOUND AXIOS STENT INSERTION;  Surgeon: Henrietta Dine, MD   . LAP CHOLECYSTECTOMY N/A 02/10/2021    Procedure: CHOLECYSTECTOMY, LAPAROSCOPIC, WITH CHOLANGIOGRAM;  Surgeon: Wandra Scot, MD     Family History   Problem Relation Age of  Onset   . No Known Problems Mother    . Other Family History Father  died from trauma     Social History     Occupational History   . Not on file   Tobacco Use   . Smoking status: Every Day     Types: Cigarettes   . Smokeless tobacco: Never   Substance and Sexual Activity   . Alcohol use: Not on file   . Drug use: Not on file   . Sexual activity: Not on file     No outpatient medications have been marked as taking for the 06/22/22 encounter [facility] Encounter).     Allergies   Allergen Reactions   . Droperidol neurological reaction     Pt states "face twitching and couldn't stop legs from moving"       Vital Signs:  Patient Vitals for the past 72 hrs:   Temp Heart Rate Resp BP BP Mean SpO2 Weight   06/22/22 0300 -- 108 16 142/90 (!) 107 MM HG 97 % --   06/21/22 2325 97.3 F (36.3 C) 102 18 (!) 177/124 (!) 142 MM HG 97 % 95.7 kg (211 lb)       Diagnostics:  Labs:    Results for orders placed or performed during the hospital encounter of 06/22/22   BASIC METABOLIC PANEL   Result Value Ref Range    Potassium 3.8 3.5 - 5.5 mmol/L    Sodium 139 133 - 145 mmol/L    Chloride 105 98 - 110 mmol/L    Glucose 90 70 - 99 mg/dL    Calcium 8.7 8.4 - 86.5 mg/dL    BUN 7 6 - 22 mg/dL    Creatinine 0.5 0.5 - 1.2 mg/dL    CO2 23 20 - 32 mmol/L    eGFR >60.0 >60.0 mL/min/1.73 sq.m.    Anion Gap 11.0 3.0 - 15.0 mmol/L   HEPATIC FUNCTION PANEL   Result Value Ref Range    Albumin 3.9 3.5 - 5.0 g/dL    Total Protein 6.8 6.4 - 8.3 g/dL    Globulin 2.9 2.0 - 4.0 g/dL    A/G Ratio 1.3 1.1 - 2.6 ratio    Bilirubin Total 0.2 0.2 - 1.2 mg/dL    Bilirubin Direct <7.8 0.0 - 0.3 mg/dL    SGOT (AST) 15 10 - 37 U/L    Alkaline Phosphatase 61 25 - 115 U/L    SGPT (ALT) 8 5 - 40 U/L   Lipase   Result Value Ref Range    Lipase <20 7 - 60 U/L   CBC WITH DIFFERENTIAL AUTO   Result Value Ref Range    WBC 6.3 4.0 - 11.0 K/uL    RBC 3.87 3.80 - 5.20 M/uL    HGB 10.5 (L) 11.7 - 15.5 g/dL    HCT 46.9 (L) 62.9 - 46.5 %    MCV 76 (L) 80 - 99 fL    MCH 27  26 - 34 pg    MCHC 36 31 - 36 g/dL    RDW 52.8 41.3 - 24.4 %    Platelet 198 140 - 440 K/uL    MPV 10.5 9.0 - 13.0 fL    Segmented Neutrophils (Auto) 46 40 - 75 %    Lymphocytes (Auto) 40 20 - 45 %    Monocytes (Auto) 10 3 - 12 %    Eosinophils (Auto) 4 0 - 6 %    Basophils (Auto) 1 0 - 2 %    Absolute Neutrophils (Auto) 2.9 1.8 - 7.7 K/uL    Absolute Lymphocytes (  Auto) 2.5 1.0 - 4.8 K/uL    Absolute Monocytes (Auto) 0.6 0.1 - 1.0 K/uL    Absolute Eosinophils (Auto) 0.3 0.0 - 0.5 K/uL    Absolute Basophils (Auto) 0.0 0.0 - 0.2 K/uL   ALCOHOL ETHYL   Result Value Ref Range    ETHANOL SERUM 0.07 (H) <=0.02 g/dL   Urinalysis with Microscopic   Result Value Ref Range    Urine Color Yellow Colorless, Pale Yellow, Light Yellow, Yellow, Dark Yellow, Straw    Urine Clarity Clear Clear, Slightly Cloudy    Urine pH 8.0 5.0 - 8.0 pH    Urine Protein Screen Trace Negative, Trace mg/dL    Urine Glucose Negative Negative mg/dL    Urine Ketones Negative Negative mg/dL    Urine Occult Blood Negative Negative    Urine Specific Gravity 1.015 1.005 - 1.030    Urine Nitrite Negative Negative    Urine Leukocyte Esterase Negative Negative    Urine Bilirubin Negative Negative    Urine Urobilinogen 1.0 <2.0 mg/dL mg/dL    Urine RBC 0-2 Negative, 0-2 /hpf    Urine WBC 0-2 0 - 5 /hpf    Urine Bacteria Present (A) Negative    Squamous Epithelial Cells 3-5 (A) None, 0-2 /hpf    Hyaline Cast 0-2 0 - 2 /lpf   PREGNANCY URINE (Lab Performed)   Result Value Ref Range    PREGNANCY URINE Negative Negative     ECG:  No results found for this visit on 06/22/22.         Medications ordered/given in the ED  Medications   sodium chloride (normal saline) 0.9% infusion (0 mL Intravenous End Infusion 06/22/22 0223)   HYDROmorphone (Dilaudid) injection 1 mg (1 mg IV Push Given 06/22/22 0122)   ondansetron (PF) (Zofran) injection 4 mg (4 mg IV Push Given 06/22/22 0122)   HYDROmorphone (Dilaudid) injection 1 mg (1 mg IV Push Given 06/22/22 0228)   magnesium hydroxide  (Milk of Magnesia) suspension 15 mL (15 mL Oral Given 06/22/22 0339)   simethicone (Mylicon) tablet 80 mg (80 mg Oral Given 06/22/22 0339)

## 2022-07-13 ENCOUNTER — Inpatient Hospital Stay
Admit: 2022-07-13 | Discharge: 2022-07-13 | Discharge status: Against Medical Advice | Payer: PRIVATE HEALTH INSURANCE | Attending: Emergency Medicine

## 2022-07-13 DIAGNOSIS — R109 Unspecified abdominal pain: Secondary | ICD-10-CM

## 2022-07-13 LAB — COMPREHENSIVE METABOLIC PANEL
ALT: 18 U/L (ref 10–49)
AST: 33 U/L (ref 0.0–33.9)
Albumin: 4.3 gm/dl (ref 3.4–5.0)
Alkaline Phosphatase: 62 U/L (ref 46–116)
Anion Gap: 5 mmol/L (ref 5–15)
BUN: <5 mg/dl — ABNORMAL LOW (ref 9–23)
CO2: 26 mEq/L (ref 20–31)
Calcium: 9.3 mg/dl (ref 8.7–10.4)
Chloride: 109 mEq/L — ABNORMAL HIGH (ref 98–107)
Creatinine: 0.72 mg/dl (ref 0.55–1.02)
GFR African American: >60
GFR Non-African American: >60
Glucose: 98 mg/dl (ref 74–106)
Potassium: 3.3 mEq/L — ABNORMAL LOW (ref 3.5–5.1)
Sodium: 140 mEq/L (ref 136–145)
Total Bilirubin: 0.2 mg/dl — ABNORMAL LOW (ref 0.30–1.20)
Total Protein: 7.9 gm/dl (ref 5.7–8.2)

## 2022-07-13 LAB — CBC WITH AUTO DIFFERENTIAL
Anisocytosis: 2
Atypical Lymphocytes: 9.1 % — ABNORMAL HIGH (ref 0–0)
Basophils: 0 % (ref 0–3)
Eosinophils: 3 % (ref 0–5)
Hematocrit: 30.9 % — ABNORMAL LOW (ref 35.0–47.0)
Hemoglobin: 11 gm/dl (ref 11.0–16.0)
Hypochromasia: 2
Immature Granulocytes %: 0.4 % (ref 0.0–3.0)
Lymphocytes: 45.5 % (ref 28–48)
MCH: 27.2 pg (ref 25.4–34.6)
MCHC: 35.6 gm/dl (ref 30.0–36.0)
MCV: 76.3 fL — ABNORMAL LOW (ref 80.0–98.0)
MPV: 10.7 fL — ABNORMAL HIGH (ref 6.0–10.0)
Macrocytes: 2
Monocytes: 5 % (ref 1–13)
Neutrophils Segmented: 37.4 % (ref 34–64)
Nucleated RBCs: 1
Platelet Appearance: NORMAL
Platelets: 204 10*3/uL (ref 140–450)
RBC: 4.05 M/uL (ref 3.60–5.20)
RDW: 42.3 (ref 36.4–46.3)
Smudge Cells: 6.1
WBC: 7.8 10*3/uL (ref 4.0–11.0)

## 2022-07-13 LAB — LIPASE: Lipase: 45 U/L (ref 12–53)

## 2022-07-13 LAB — MAGNESIUM: Magnesium: 1.7 mg/dL (ref 1.6–2.6)

## 2022-07-13 MED ORDER — DIPHENHYDRAMINE HCL 50 MG/ML IJ SOLN
50 | INTRAMUSCULAR | Status: AC
Start: 2022-07-13 — End: 2022-07-13
  Administered 2022-07-13: 08:00:00 25 mg via INTRAVENOUS

## 2022-07-13 MED ORDER — SODIUM CHLORIDE 0.9 % IV BOLUS
0.9 | Freq: Once | INTRAVENOUS | Status: AC
Start: 2022-07-13 — End: 2022-07-13
  Administered 2022-07-13: 08:00:00 1000 mL via INTRAVENOUS

## 2022-07-13 MED ORDER — ONDANSETRON HCL 4 MG/2ML IJ SOLN
4 | Freq: Once | INTRAMUSCULAR | Status: AC
Start: 2022-07-13 — End: 2022-07-13
  Administered 2022-07-13: 08:00:00 4 mg via INTRAVENOUS

## 2022-07-13 MED ORDER — METOCLOPRAMIDE HCL 5 MG/ML IJ SOLN
5 | INTRAMUSCULAR | Status: DC
Start: 2022-07-13 — End: 2022-07-13

## 2022-07-13 MED ORDER — FAMOTIDINE (PF) 20 MG/2ML IV SOLN
20 | INTRAVENOUS | Status: AC
Start: 2022-07-13 — End: 2022-07-13
  Administered 2022-07-13: 08:00:00 20 mg via INTRAVENOUS

## 2022-07-13 MED ORDER — PROCHLORPERAZINE EDISYLATE 10 MG/2ML IJ SOLN
10 | Freq: Once | INTRAMUSCULAR | Status: AC
Start: 2022-07-13 — End: 2022-07-13
  Administered 2022-07-13: 08:00:00 10 mg via INTRAVENOUS

## 2022-07-13 MED ORDER — DIPHENHYDRAMINE HCL 50 MG/ML IJ SOLN
50 | Freq: Once | INTRAMUSCULAR | Status: DC
Start: 2022-07-13 — End: 2022-07-13

## 2022-07-13 MED ORDER — POTASSIUM CHLORIDE 10 MEQ/100ML IV SOLN
10 | Freq: Once | INTRAVENOUS | Status: DC
Start: 2022-07-13 — End: 2022-07-13

## 2022-07-13 MED FILL — PROCHLORPERAZINE EDISYLATE 10 MG/2ML IJ SOLN: 10 MG/2ML | INTRAMUSCULAR | Qty: 2

## 2022-07-13 MED FILL — ONDANSETRON HCL 4 MG/2ML IJ SOLN: 4 MG/2ML | INTRAMUSCULAR | Qty: 2

## 2022-07-13 MED FILL — POTASSIUM CHLORIDE 10 MEQ/100ML IV SOLN: 10 MEQ/0ML | INTRAVENOUS | Qty: 100

## 2022-07-13 MED FILL — METOCLOPRAMIDE HCL 5 MG/ML IJ SOLN: 5 MG/ML | INTRAMUSCULAR | Qty: 2

## 2022-07-13 MED FILL — DIPHENHYDRAMINE HCL 50 MG/ML IJ SOLN: 50 MG/ML | INTRAMUSCULAR | Qty: 1

## 2022-07-13 MED FILL — FAMOTIDINE (PF) 20 MG/2ML IV SOLN: 20 MG/2ML | INTRAVENOUS | Qty: 2

## 2022-07-13 NOTE — ED Notes (Signed)
Patient disconnected self from NS bolus and ambulated to the bathroom. No unsteady gait noted. Able to walk upright without difficulties.     Lowella Bandy, RN  07/13/22 [phone]

## 2022-07-13 NOTE — ED Notes (Signed)
Ambulated from charge nurse desk to ER 09 assisted by EMS. Has emesis bag. Oriented to unit and call bell system. Call bell within reach.     Lowella Bandy, RN  07/13/22 [phone]

## 2022-07-13 NOTE — ED Notes (Signed)
Patient ambulated to the bathroom and then tried to go off the unit. Intercepted by ED Tech and brought back to the room.     Lowella Bandy, RN  07/13/22 0430

## 2022-07-13 NOTE — ED Provider Notes (Signed)
[facility]  Emergency Department Treatment Report        Patient: Rhonda Hammond Age: 32 y.o. Sex: female    Date of Birth: 09-08-1990 Admit Date: 07/13/2022 PCP: Danny Lawless, MD   MRN: [id number]  CSN: [id number]  Attending: Dianna Rossetti, MD   Room: OTF/OTF Time Dictated: 6:40 AM APP:  Dan Humphreys, PA-C       Chief Complaint   Chief Complaint   Patient presents with    Abdominal Pain       History of Present Illness   This is a 32 y.o. female with history of alcohol abuse, alcohol induced chronic pancreatitis brought in by EMS from home complaining of constant epigastric abdominal pain x 2 days.  Denies exacerbating or alleviating factors. She complains of nausea and vomiting this evening, denies hematemesis. Denies fever, chills, diarrhea or constipation. Denies urinary symptoms including dysuria or hematuria.  She has a history of alcohol induced pancreatitis and admits to drinking 2 bottles of Moscato this evening.    Review of Systems   Constitutional: No fever, chills  Eyes: No visual symptoms.  ENT: No sore throat, runny nose  Respiratory: No cough, dyspnea or wheezing.  Cardiovascular: No chest pain, palpitations,   Gastrointestinal: positive epigastric abdominal pain, vomiting  Genitourinary: No dysuria or frequency  Musculoskeletal: No lower extremity swelling  Integumentary: No rashes.  Neurological: No headaches  Denies complaints in all other systems.    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     Intractable nausea and vomiting 06/26/2020    Obesity 12/16/2018    Ovarian cyst     Pancreatitis      Past Surgical History:   Procedure Laterality Date    CESAREAN SECTION      CHOLECYSTECTOMY      UPPER GASTROINTESTINAL ENDOSCOPY N/A 10/12/2021    EGD DIAGNOSTIC performed by Elberta Spaniel, MD at [facility]  ENDOSCOPY     Social History     Social History     Socioeconomic History    Marital status: Single     Spouse name: Not on file    Number of children: Not on file    Years of education: Not on file    Highest education level: Not on file   Occupational History    Not on file   Tobacco Use    Smoking status: Every Day     Types: Cigars    Smokeless tobacco: Not on file   Vaping Use    Vaping Use: Never used   Substance and Sexual Activity    Alcohol use: Yes     Alcohol/week: 21.0 standard drinks of alcohol     Types: 21 Shots of liquor per week     Comment: 2-3 drinks a day, sometimes more    Drug use: Yes     Frequency: 7.0 times per week     Types: Marijuana Sheran Fava)    Sexual activity: Not on file   Other Topics Concern    Not on file   Social History Narrative    Not on file     Social Determinants of Health     Financial Resource Strain: Not on file   Food Insecurity: No Food Insecurity (12/16/2021)  Hunger Vital Sign     Worried About Running Out of Food in the Last Year: Never true     Ran Out of Food in the Last Year: Never true   Transportation Needs: No Transportation Needs (12/16/2021)    PRAPARE - Therapist, art (Medical): No     Lack of Transportation (Non-Medical): No   Physical Activity: Not on file   Stress: Not on file   Social Connections: Not on file   Intimate Partner Violence: Not on file   Housing Stability: Low Risk  (12/16/2021)    Housing Stability Vital Sign     Unable to Pay for Housing in the Last Year: No     Number of Places Lived in the Last Year: 1     Unstable Housing in the Last Year: No     Family History     Family History   Problem Relation Age of Onset    No Known Problems Mother      Current Medications     Current Facility-Administered Medications   Medication Dose Route Frequency Provider Last Rate Last Admin    metoclopramide (REGLAN) injection 5 mg  5 mg IntraVENous NOW Dianna Rossetti, MD        diphenhydrAMINE (BENADRYL) injection 25 mg  25 mg  IntraVENous Once Dianna Rossetti, MD        potassium chloride 10 mEq/100 mL IVPB (Peripheral Line)  10 mEq IntraVENous Once Dianna Rossetti, MD         Current Outpatient Medications   Medication Sig Dispense Refill    dicyclomine (BENTYL) 20 MG tablet Take 1 tablet by mouth 4 times daily 24 tablet 0    pantoprazole (PROTONIX) 20 MG tablet Take 1 tablet by mouth daily 30 tablet 0    metoclopramide (REGLAN) 10 MG tablet Take 1 tablet by mouth 4 times daily 120 tablet 3    hyoscyamine (LEVSIN) 125 MCG tablet Take 1 tablet by mouth every 4 hours as needed for Cramping 40 tablet 0    famotidine (PEPCID) 20 MG tablet Take 1 tablet by mouth 2 times daily 60 tablet 0    sucralfate (CARAFATE) 1 GM tablet Take 1 tablet by mouth 4 times daily for 14 days 56 tablet 0    ondansetron (ZOFRAN-ODT) 4 MG disintegrating tablet Take 1 tablet by mouth 3 times daily as needed for Nausea or Vomiting 21 tablet 0    albuterol sulfate HFA (PROVENTIL;VENTOLIN;PROAIR) 108 (90 Base) MCG/ACT inhaler Inhale 2 puffs into the lungs every 6 hours as needed for Wheezing 18 g 3    dicyclomine (BENTYL) 20 MG tablet Take 1 tablet by mouth 4 times daily for 10 days 40 tablet 0       Allergies     Allergies   Allergen Reactions    Droperidol Other (See Comments)     Face twitching, drooling and spontaneous leg movement       Physical Exam   Patient Vitals for the past 24 hrs:   BP Temp Temp src Pulse Resp SpO2 Height Weight   07/13/22 0218 (!) 153/90 98.2 F (36.8 C) Oral (!) 110 18 98 % 1.651 m (5\' 5" ) 85.7 kg (189 lb)       Constitutional: Patient appears well developed and well nourished. Appearance and behavior are age and situation appropriate.  HEENT: Conjunctiva clear. PERRLA. Mucous membranes moist, non-erythematous. Surface of the pharynx, palate, and tongue are pink, moist and without lesions.  Neck: supple, non tender, symmetrical, no masses or JVD. No lymphadenopathy.  Respiratory: lungs clear to auscultation, nonlabored respirations. No tachypnea  or accessory muscle use.  Cardiovascular: heart regular rate and rhythm without murmur rubs or gallops.    Gastrointestinal:  Abdomen soft, nondistended, nontender without complaint of pain to palpation, no CVA tenderness noted bilaterally  Musculoskeletal: Calves soft and non-tender. Distal pulses 2+ and equal bilaterally.  No peripheral edema   Integumentary: warm and dry without rashes or lesions  Neurologic: alert and oriented. No facial asymmetry or dysarthria. Moving all extremities well  Impression and Management Plan   32 y.o. female with history of chronic alcoholism, alcohol induced chronic pancreatitis presents complaining of epigastric abdominal pain with nausea and vomiting.  Patient is afebrile, hypertensive and tachycardic, rest of her vital signs are stable.  Patient is pacing around the examining bed, she did lie down on the bed long enough for me to examine her, her abdomen is essentially soft and benign.  We will obtain appropriate labs and medicate symptomatically and start IV fluid hydration.  Differential diagnoses include alcoholic gastritis, pancreatitis, PUD, GERD, infectious, obstruction    Diagnostic Studies   Lab:   Results for orders placed or performed during the hospital encounter of 07/13/22   CBC with Diff   Result Value Ref Range    WBC 7.8 4.0 - 11.0 1000/mm3    RBC 4.05 3.60 - 5.20 M/uL    Hemoglobin 11.0 11.0 - 16.0 gm/dl    Hematocrit 28.4 (L) 35.0 - 47.0 %    MCV 76.3 (L) 80.0 - 98.0 fL    MCH 27.2 25.4 - 34.6 pg    MCHC 35.6 30.0 - 36.0 gm/dl    Platelets 132 440 - 450 1000/mm3    MPV 10.7 (H) 6.0 - 10.0 fL    RDW 42.3 36.4 - 46.3     CMP   Result Value Ref Range    Potassium 3.3 (L) 3.5 - 5.1 mEq/L    Chloride 109 (H) 98 - 107 mEq/L    Sodium 140 136 - 145 mEq/L    CO2 26 20 - 31 mEq/L    Glucose 98 74 - 106 mg/dl    BUN <5 (L) 9 - 23 mg/dl    Creatinine 1.02 7.25 - 1.02 mg/dl    GFR African American >60.0      GFR Non-African American >60      Calcium 9.3 8.7 - 10.4 mg/dl     Anion Gap 5 5 - 15 mmol/L    AST 33.0 0.0 - 33.9 U/L    ALT 18 10 - 49 U/L    Alkaline Phosphatase 62 46 - 116 U/L    Total Bilirubin 0.20 (L) 0.30 - 1.20 mg/dl    Total Protein 7.9 5.7 - 8.2 gm/dl    Albumin 4.3 3.4 - 5.0 gm/dl   Lipase   Result Value Ref Range    Lipase 45 12 - 53 U/L   Magnesium   Result Value Ref Range    Magnesium 1.7 1.6 - 2.6 mg/dL     Imaging:    No orders to display      Other studies:  My interpretation of other studies is that they show, among other things, CBC is unremarkable.  Lipase normal.    ED Course/MDM         RECORDS REVIEWED:  I reviewed the patient's previous records here at [facility] and available outside facilities and note that multiple  ED visits for similar complaint of abdominal pain, alcohol use    EXTERNAL RESULTS REVIEWED: Labs and imaging reviewed-CT of the abdomen pelvis with IV contrast on 01/08/2022  IMPRESSION:     1. Cholecystectomy and pancreatic stent placement.  2. 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 Residual pancreatic ductal dilatation stable.  Decreased acute pancreatitis.     3. Constipation.     Electronically signed by: Darlina Rumpf, MD 01/08/2022 8:10 PM EST            Workstation ID: [id number]  INDEPENDENT HISTORIAN:  History and/or plan development assisted by: self    Severe exacerbation or progression of chronic illness: yes, chronic abdominal pain       Threat to body function without evaluation and management:  Gastrointestinal       SOCIAL DETERMINANTS  impacting Evaluation and Management: Provider accessibility       Comorbidities impacting Evaluation and Management: alcoholism       Critical Care Time (if necessary)     Medications   metoclopramide (REGLAN) injection 5 mg (has no administration in time range)   diphenhydrAMINE (BENADRYL) injection 25 mg (has no administration in time range)   potassium chloride 10 mEq/100 mL IVPB (Peripheral Line) (has no administration in time  range)   sodium chloride 0.9 % bolus 1,000 mL (0 mLs IntraVENous Stopped 07/13/22 0533)   diphenhydrAMINE (BENADRYL) injection 25 mg (25 mg IntraVENous Given 07/13/22 0336)   prochlorperazine (COMPAZINE) injection 10 mg (10 mg IntraVENous Given 07/13/22 0335)   famotidine (PEPCID) 20 mg in sodium chloride (PF) 0.9 % 10 mL injection (20 mg IntraVENous Given 07/13/22 0336)   ondansetron (ZOFRAN) injection 4 mg (4 mg IntraVENous Given 07/13/22 0350)       NARRATIVE:  Patient was pacing around the examining room.  Discussed plan to obtain labs, give IV fluid hydration and medicate symptomatically.  Patient requesting which medications I was going to give her and specifically requesting narcotic pain medication however given her alcohol use this evening I advised that I would not be prescribing her narcotic pain medication at this time and patient stated "you fucking white people". Patient started cursing at me and requesting transfer to a different hospital, charge nurse made aware and came to speak to patient. Patient calmed down and agreeable to IV fluid hydration and medications ordered.   Patient remained stable throughout her stay in the ED with no new or worsening symptoms.  I have reviewed the results with the patient.  CBC is unremarkable.  Chemistry is unremarkable.  Lipase normal.  I suspect most likely alcoholic gastritis, exacerbated chronic pain.  Patient was given IV fluid hydration and medicated with IV Benadryl, Compazine Pepcid, Zofran with some improvement of her symptoms but persistently requesting "stronger pain medication".  Again her abdomen is soft and benign on re exam.  I was informed by the nurse that patient ripped out her IV and eloped from the emergency department.      Final Diagnosis       ICD-10-CM    1. Chronic abdominal pain  R10.9     G89.29       2. Nausea and vomiting, unspecified vomiting type  R11.2       3. Acute alcoholic intoxication without complication (HCC)  F10.920            Disposition   Eloped        Medication List  ASK your doctor about these medications      albuterol sulfate HFA 108 (90 Base) MCG/ACT inhaler  Commonly known as: PROVENTIL;VENTOLIN;PROAIR  Inhale 2 puffs into the lungs every 6 hours as needed for Wheezing     * dicyclomine 20 MG tablet  Commonly known as: BENTYL  Take 1 tablet by mouth 4 times daily for 10 days     * dicyclomine 20 MG tablet  Commonly known as: BENTYL  Take 1 tablet by mouth 4 times daily     famotidine 20 MG tablet  Commonly known as: Pepcid  Take 1 tablet by mouth 2 times daily     hyoscyamine 125 MCG tablet  Commonly known as: Levsin  Take 1 tablet by mouth every 4 hours as needed for Cramping     metoclopramide 10 MG tablet  Commonly known as: Reglan  Take 1 tablet by mouth 4 times daily     ondansetron 4 MG disintegrating tablet  Commonly known as: ZOFRAN-ODT  Take 1 tablet by mouth 3 times daily as needed for Nausea or Vomiting     pantoprazole 20 MG tablet  Commonly known as: Protonix  Take 1 tablet by mouth daily     sucralfate 1 GM tablet  Commonly known as: Carafate  Take 1 tablet by mouth 4 times daily for 14 days           * This list has 2 medication(s) that are the same as other medications prescribed for you. Read the directions carefully, and ask your doctor or other care provider to review them with you.                The patient was fully discussed with Dianna Rossetti, MD who agrees with the above assessment and plan      Luna Fuse PA-C  July 13, 2022    My signature above authenticates this document and my orders, the final    diagnosis (es), discharge prescription (s), and instructions in the Epic    record.  If you have any questions please contact [phone].     Nursing notes have been reviewed by the physician/ advanced practice    Clinician.                           Dan Humphreys, [place]  07/13/22 [phone]

## 2022-07-13 NOTE — ED Notes (Signed)
Patient not forthcoming with stating how much she drinks on a monthly basis. States that she got some bad news tonight that resulted in her drinking two bottle of moscato. Still unable to sit still. In and out of the bed wrapped in a flat sheet at this time.     Lowella Bandy, RN  07/13/22 [phone]

## 2022-07-13 NOTE — ED Notes (Signed)
Ambulated to the bathroom to obtain a urine specimen. No unsteady gait noted.     Lowella Bandy, RN  07/13/22 [phone]

## 2022-07-13 NOTE — ED Notes (Signed)
Patient uncooperative with PIV start. Will not sit down. One episode of emesis in the trash can. Still cannot sit still.  After approximately 20 minutes, patient has agreed to sit still for PIV start.     Lowella Bandy, RN  07/13/22 [phone]

## 2022-07-13 NOTE — ED Notes (Signed)
Notified patient that she was getting medications and potassium replacement. Patient asked if she was getting dilaudid. Informed patient of the medications that she was prescribed. Patient pulled out her PIV and left. Press photographer and provider notified. Patient eloped.     Lowella Bandy, RN  07/13/22 [phone]

## 2022-07-13 NOTE — ED Notes (Signed)
Patient begging to stay on the floor. Educated of infectious control issues. On stretcher at this time with side rails times two for safety. Cannot sit still. Call bell within reach and within eyesight of this nurse.     Lowella Bandy, RN  07/13/22 [phone]

## 2022-07-13 NOTE — ED Notes (Signed)
Noted 150 mL of bile like, yellow emesis. Patient is starting to settle down and stop moving so much. Got out of bed, turned off light and is currently kneeling at the foot of the stretcher as if she is praying.     Lowella Bandy, RN  07/13/22 [phone]

## 2022-07-13 NOTE — ED Triage Notes (Signed)
PT presents to the ED via EMS transport from home for eval of abdominal pain. Pt has a history of pancreatitis.

## 2022-07-16 LAB — HEMOGLOBIN A1C
Estimated Avg Glucose, External: 84 mg/dL — ABNORMAL LOW (ref 91–123)
Hemoglobin A1C, External: 4.6 % — ABNORMAL LOW (ref 4.8–5.6)

## 2022-07-26 ENCOUNTER — Inpatient Hospital Stay
Admit: 2022-07-26 | Discharge: 2022-07-26 | Disposition: A | Discharge status: Home / Self Care | Payer: PRIVATE HEALTH INSURANCE | Attending: Student in an Organized Health Care Education/Training Program

## 2022-07-26 DIAGNOSIS — K860 Alcohol-induced chronic pancreatitis: Principal | ICD-10-CM

## 2022-07-26 LAB — CBC WITH AUTO DIFFERENTIAL
Basophils: 0.1 % (ref 0–3)
Eosinophils: 2.5 % (ref 0–5)
Hematocrit: 35.6 % (ref 35.0–47.0)
Hemoglobin: 12.6 gm/dl (ref 11.0–16.0)
Immature Granulocytes %: 0.1 % (ref 0.0–3.0)
Lymphocytes: 25.3 % — ABNORMAL LOW (ref 28–48)
MCH: 27.2 pg (ref 25.4–34.6)
MCHC: 35.4 gm/dl (ref 30.0–36.0)
MCV: 76.7 fL — ABNORMAL LOW (ref 80.0–98.0)
MPV: 11.5 fL — ABNORMAL HIGH (ref 6.0–10.0)
Monocytes: 9.4 % (ref 1–13)
Neutrophils Segmented: 62.6 % (ref 34–64)
Nucleated RBCs: 0 (ref 0–0)
Platelets: 212 10*3/uL (ref 140–450)
RBC: 4.64 M/uL (ref 3.60–5.20)
RDW: 39.3 (ref 36.4–46.3)
WBC: 6.9 10*3/uL (ref 4.0–11.0)

## 2022-07-26 LAB — COMPREHENSIVE METABOLIC PANEL
ALT: 13 U/L (ref 10–49)
AST: 23 U/L (ref 0.0–33.9)
Albumin: 4.3 gm/dl (ref 3.4–5.0)
Alkaline Phosphatase: 48 U/L (ref 46–116)
Anion Gap: 8 mmol/L (ref 5–15)
BUN: 10 mg/dl (ref 9–23)
CO2: 26 mEq/L (ref 20–31)
Calcium: 10.1 mg/dl (ref 8.7–10.4)
Chloride: 101 mEq/L (ref 98–107)
Creatinine: 0.61 mg/dl (ref 0.55–1.02)
GFR African American: >60
GFR Non-African American: >60
Glucose: 103 mg/dl (ref 74–106)
Potassium: 2.9 mEq/L — CL (ref 3.5–5.1)
Sodium: 135 mEq/L — ABNORMAL LOW (ref 136–145)
Total Bilirubin: 0.5 mg/dl (ref 0.30–1.20)
Total Protein: 7.9 gm/dl (ref 5.7–8.2)

## 2022-07-26 LAB — POC CHEM 8
BUN: 12 mg/dl (ref 7–25)
Calcium, Ionized: 4.5 mg/dL (ref 4.40–5.40)
Chloride: 98 mEq/L (ref 98–107)
Creatinine: 0.4 mg/dl — ABNORMAL LOW (ref 0.6–1.3)
Glucose: 109 mg/dL — ABNORMAL HIGH (ref 74–106)
Hematocrit: 46 % — ABNORMAL HIGH (ref 38–45)
Hemoglobin: 15.6 gm/dl (ref 13.0–17.2)
Potassium: 4.3 mEq/L (ref 3.5–4.9)
Sodium: 137 mEq/L (ref 136–145)

## 2022-07-26 LAB — LIPASE: Lipase: 84 U/L — ABNORMAL HIGH (ref 12–53)

## 2022-07-26 MED ORDER — POTASSIUM BICARBONATE 25 MEQ PO TBEF
25 | ORAL | Status: AC
Start: 2022-07-26 — End: 2022-07-26
  Administered 2022-07-26: 09:00:00 50 meq via ORAL

## 2022-07-26 MED ORDER — DIPHENHYDRAMINE HCL 50 MG/ML IJ SOLN
50 | INTRAMUSCULAR | Status: AC
Start: 2022-07-26 — End: 2022-07-26
  Administered 2022-07-26: 09:00:00 25 mg via INTRAVENOUS

## 2022-07-26 MED ORDER — ONDANSETRON HCL 4 MG/2ML IJ SOLN
4 | Freq: Once | INTRAMUSCULAR | Status: AC
Start: 2022-07-26 — End: 2022-07-26
  Administered 2022-07-26: 09:00:00 4 mg via INTRAVENOUS

## 2022-07-26 MED ORDER — FAMOTIDINE (PF) 20 MG/2ML IV SOLN
202 MG/2ML | INTRAVENOUS | Status: AC
Start: 2022-07-26 — End: 2022-07-26
  Administered 2022-07-26: 09:00:00 20 mg via INTRAVENOUS

## 2022-07-26 MED ORDER — PROCHLORPERAZINE EDISYLATE 10 MG/2ML IJ SOLN
10 | Freq: Once | INTRAMUSCULAR | Status: AC
Start: 2022-07-26 — End: 2022-07-26
  Administered 2022-07-26: 09:00:00 10 mg via INTRAVENOUS

## 2022-07-26 MED ORDER — SODIUM CHLORIDE 0.9 % IV BOLUS
0.9 | Freq: Once | INTRAVENOUS | Status: AC
Start: 2022-07-26 — End: 2022-07-26
  Administered 2022-07-26: 09:00:00 1000 mL via INTRAVENOUS

## 2022-07-26 MED FILL — PROCHLORPERAZINE EDISYLATE 10 MG/2ML IJ SOLN: 10 MG/2ML | INTRAMUSCULAR | Qty: 2

## 2022-07-26 MED FILL — FAMOTIDINE (PF) 20 MG/2ML IV SOLN: 20 MG/2ML | INTRAVENOUS | Qty: 2

## 2022-07-26 MED FILL — ONDANSETRON HCL 4 MG/2ML IJ SOLN: 4 MG/2ML | INTRAMUSCULAR | Qty: 2

## 2022-07-26 MED FILL — EFFER-K 25 MEQ PO TBEF: 25 MEQ | ORAL | Qty: 2

## 2022-07-26 MED FILL — DIPHENHYDRAMINE HCL 50 MG/ML IJ SOLN: 50 MG/ML | INTRAMUSCULAR | Qty: 1

## 2022-07-26 NOTE — ED Triage Notes (Signed)
Pt presents to the ED via EMS transport for eval of abdominal pain with radiation around to her back.

## 2022-07-26 NOTE — ED Notes (Addendum)
Pt was given discharge paperwork and was understanding of all information provided. Pt showing no signs of distress at time of discharge and I'm ambulatory with steady gait to ED exit.          Thalia Bloodgood, RN  07/26/22 0559       Thalia Bloodgood, RN  07/26/22 [phone]

## 2022-07-26 NOTE — ED Provider Notes (Signed)
[facility]  Emergency Department Treatment Report        Patient: Rhonda Hammond Age: 32 y.o. Sex: female    Date of Birth: 10-Apr-1990 Admit Date: 07/26/2022 PCP: Danny Lawless, MD   MRN: [id number]  CSN: [id number]  No att. providers found   Room: H24/H24 Time Dictated: 6:19 AM [facility]       Chief Complaint   Chief Complaint   Patient presents with    Abdominal Pain       History of Present Illness   This is a 32 y.o. female with history of alcohol induced chronic pancreatitis, alcoholic gastritis, intractable nausea and vomiting, cholecystectomy who presents to the ED complaining of abdominal pain nausea and vomiting.  Patient reports 2 days she started to have epigastric pain that has progressively worsened.  States that she tried ibuprofen, Aleve, and Tylenol at home with no relief.  States that she took Phenergan at 9 AM yesterday with relief of nausea.  Reports that she has had 3 hours of nausea and vomiting prior to arrival" I usually come to the ED when I cannot handle my pain at home".  Patient is upset that EMS brought her here when she prefers to go to [facility].  Denies any recent alcohol use.  Denies any fevers or chills or urinary symptoms or chest pain or shortness of breath.    Review of Systems   Review of Systems  As per HPI    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     Intractable nausea and vomiting 06/26/2020    Obesity 12/16/2018    Ovarian cyst     Pancreatitis      Past Surgical History:   Procedure Laterality Date    CESAREAN SECTION      CHOLECYSTECTOMY      UPPER GASTROINTESTINAL ENDOSCOPY N/A 10/12/2021    EGD DIAGNOSTIC performed by Elberta Spaniel, MD at [facility] ENDOSCOPY       Social History     Social History     Socioeconomic History    Marital status: Single     Spouse  name: Not on file    Number of children: Not on file    Years of education: Not on file    Highest education level: Not on file   Occupational History    Not on file   Tobacco Use    Smoking status: Every Day     Types: Cigars    Smokeless tobacco: Not on file   Vaping Use    Vaping Use: Never used   Substance and Sexual Activity    Alcohol use: Yes     Alcohol/week: 21.0 standard drinks of alcohol     Types: 21 Shots of liquor per week     Comment: 2-3 drinks a day, sometimes more    Drug use: Yes     Frequency: 7.0 times per week     Types: Marijuana Sheran Fava)    Sexual activity: Not on file   Other Topics Concern    Not on file   Social History Narrative    Not on file     Social Determinants of Health     Financial Resource Strain: Not on file   Food Insecurity:  No Food Insecurity (12/16/2021)    Hunger Vital Sign     Worried About Running Out of Food in the Last Year: Never true     Ran Out of Food in the Last Year: Never true   Transportation Needs: No Transportation Needs (12/16/2021)    PRAPARE - Therapist, art (Medical): No     Lack of Transportation (Non-Medical): No   Physical Activity: Not on file   Stress: Not on file   Social Connections: Not on file   Intimate Partner Violence: Not on file   Housing Stability: Low Risk  (12/16/2021)    Housing Stability Vital Sign     Unable to Pay for Housing in the Last Year: No     Number of Places Lived in the Last Year: 1     Unstable Housing in the Last Year: No       Family History     Family History   Problem Relation Age of Onset    No Known Problems Mother        Current Medications     Prior to Admission Medications   Prescriptions Last Dose Informant Patient Reported? Taking?   albuterol sulfate HFA (PROVENTIL;VENTOLIN;PROAIR) 108 (90 Base) MCG/ACT inhaler   No No   Sig: Inhale 2 puffs into the lungs every 6 hours as needed for Wheezing   dicyclomine (BENTYL) 20 MG tablet   No No   Sig: Take 1 tablet by mouth 4 times daily for 10 days    dicyclomine (BENTYL) 20 MG tablet   No No   Sig: Take 1 tablet by mouth 4 times daily   famotidine (PEPCID) 20 MG tablet   No No   Sig: Take 1 tablet by mouth 2 times daily   hyoscyamine (LEVSIN) 125 MCG tablet   No No   Sig: Take 1 tablet by mouth every 4 hours as needed for Cramping   metoclopramide (REGLAN) 10 MG tablet   No No   Sig: Take 1 tablet by mouth 4 times daily   ondansetron (ZOFRAN-ODT) 4 MG disintegrating tablet   No No   Sig: Take 1 tablet by mouth 3 times daily as needed for Nausea or Vomiting   pantoprazole (PROTONIX) 20 MG tablet   No No   Sig: Take 1 tablet by mouth daily   sucralfate (CARAFATE) 1 GM tablet   No No   Sig: Take 1 tablet by mouth 4 times daily for 14 days      Facility-Administered Medications: None       Allergies     Allergies   Allergen Reactions    Droperidol Other (See Comments)     Face twitching, drooling and spontaneous leg movement       Physical Exam     ED Triage Vitals [07/26/22 0341]   Enc Vitals Group      BP 129/87      Pulse 74      Respirations 20      Temp 98.4 F (36.9 C)      Temp Source Oral      SpO2 96 %      Weight - Scale 86.2 kg (190 lb)      Height 1.651 m (5\' 5" )      Head Circumference       Peak Flow       Pain Score       Pain Loc  Pain Edu?       Excl. in GC?      Physical Exam  Vitals reviewed.   Constitutional:       General: She is not in acute distress.     Appearance: She is well-developed. She is obese. She is not ill-appearing, toxic-appearing or diaphoretic.      Comments: Rocking back-and-forth in bed with hoodie over her head.   HENT:      Mouth/Throat:      Comments: Mildly dry mucous membrane  Cardiovascular:      Rate and Rhythm: Normal rate and regular rhythm.      Heart sounds: Normal heart sounds.   Pulmonary:      Effort: Pulmonary effort is normal.   Abdominal:      General: Bowel sounds are normal.      Palpations: Abdomen is soft.      Tenderness: There is abdominal tenderness in the epigastric area. There is no guarding  or rebound.   Neurological:      Mental Status: She is alert.           Impression and Management Plan   32 year old female with history of chronic alcoholic pancreatitis presents to the ED complaining of epigastric pain nausea and vomiting.  States epigastric pain started 2 days ago progressively worsened with 3 hours of nausea or vomiting.  Abdominal tenderness epigastric area.  Based on patient's records, she has been seen numerous times in the last month for chronic pancreatitis.  Abdominal labs ordered and pending.  Will treat symptomatically.    Differential Diagnosis: Ddx is but not limited to: Chronic pancreatitis, gastritis, gastroenteritis    Procedures    Diagnostic Studies   Lab:   Labs Reviewed   CBC WITH AUTO DIFFERENTIAL - Abnormal; Notable for the following components:       Result Value    MCV 76.7 (*)     MPV 11.5 (*)     Lymphocytes 25.3 (*)     All other components within normal limits   COMPREHENSIVE METABOLIC PANEL - Abnormal; Notable for the following components:    Potassium 2.9 (*)     Sodium 135 (*)     All other components within normal limits   LIPASE - Abnormal; Notable for the following components:    Lipase 84 (*)     All other components within normal limits   POC CHEM 8 - Abnormal; Notable for the following components:    Glucose 109 (*)     Creatinine 0.4 (*)     Hematocrit 46 (*)     All other components within normal limits   POC CHEM 8       Medical Decision Making/ ED Course         NARRATIVE: CBC within normal limits with no leukocytosis or neutrophilia.  CMP significant for hypokalemia 2.9.  Renal function and LFTs within normal limits.  Lipase is stable at 84 which appears to be patient's baseline in the last month.  She was given symptomatic treatment with Compazine, Benadryl, Pepcid, Zofran and IV fluids.  No signs of distress and she is ambulatory to the bathroom without difficulty.  Strict return precautions provided.  Patient ready to be discharged.    INTERNAL/EXTERNAL  RECORDS REVIEWED: I reviewed the patient's previous records here at [facility] and available outside facilities and note that patient has been seen [facility] ED 8 times since last month for chronic alcoholic gastritis and pancreatitis with nausea and vomiting.  She has left or eloped multiple times due to not receiving narcotic medication.  Patient received a CT 07/14/2022 that showed pancreatic stent with no evidence of adjacent fluid collection.  GI reviewed his CT and found stent to be in the correct place.    Severe exacerbation or progression of chronic illness: Chronic pancreatitis    Threat to body function without evaluation and management: GI    Social Determinants impacting E&M: Overcrowding in the ED, access to care, health literacy    Comorbidities impacting Evaluation and Management: Alcohol abuse    Final Diagnosis       ICD-10-CM    1. Alcohol-induced chronic pancreatitis (HCC)  K86.0       2. Abdominal pain, epigastric  R10.13       3. Nausea and vomiting, unspecified vomiting type  R11.2             Disposition     Disposition and plan:  Patient was discharged home in stable condition with discharge instructions on the same.     Return to the ER if condition worsens or new symptoms develop.   Follow up with primary care and gastroenterology as discussed.        Medication List        ASK your doctor about these medications      albuterol sulfate HFA 108 (90 Base) MCG/ACT inhaler  Commonly known as: PROVENTIL;VENTOLIN;PROAIR  Inhale 2 puffs into the lungs every 6 hours as needed for Wheezing     * dicyclomine 20 MG tablet  Commonly known as: BENTYL  Take 1 tablet by mouth 4 times daily for 10 days     * dicyclomine 20 MG tablet  Commonly known as: BENTYL  Take 1 tablet by mouth 4 times daily     famotidine 20 MG tablet  Commonly known as: Pepcid  Take 1 tablet by mouth 2 times daily     hyoscyamine 125 MCG tablet  Commonly known as: Levsin  Take 1 tablet by mouth every 4 hours as needed for Cramping      metoclopramide 10 MG tablet  Commonly known as: Reglan  Take 1 tablet by mouth 4 times daily     ondansetron 4 MG disintegrating tablet  Commonly known as: ZOFRAN-ODT  Take 1 tablet by mouth 3 times daily as needed for Nausea or Vomiting     pantoprazole 20 MG tablet  Commonly known as: Protonix  Take 1 tablet by mouth daily     sucralfate 1 GM tablet  Commonly known as: Carafate  Take 1 tablet by mouth 4 times daily for 14 days           * This list has 2 medication(s) that are the same as other medications prescribed for you. Read the directions carefully, and ask your doctor or other care provider to review them with you.                    The patient was personally evaluated by myself and Dr. Clarene Duke who agrees with the above assessment and plan.    Dragon medical dictation software was used for portions of this report. Unintended errors may occur.     Elijio Miles, PA-C  July 26, 2022    My signature above authenticates this document and my orders, the final    diagnosis (es), discharge prescription (s), and instructions in the Epic    record.  If you have any questions please contact (  [phone].     Nursing notes have been reviewed by the physician/ advanced practice    Clinician.       Elijio Miles, [place]  07/26/22 [phone]

## 2022-07-26 NOTE — ED Notes (Signed)
IV placed, Labs drawn and sent to lab.   CHEM 8 completed.      Rhonda Hammond  07/26/22 0981

## 2022-07-26 NOTE — ED Notes (Signed)
This RN messaged provider K+2.9     Wyvonnia Dusky, RN  07/26/22 [phone]

## 2022-07-26 NOTE — Discharge Instructions (Signed)
Continue taking Phenergan and Zofran as needed for nausea and vomiting.  Drink adequate fluids is much as possible.  Take over-the-counter pain medication as needed for pain.  Schedule appointment to follow-up with your primary care provider.  Reduce alcohol intake.  Return to the ED if you experience any worsening symptoms or as needed.

## 2023-01-31 NOTE — ED Provider Notes (Signed)
ED Attending Supervision Note    I have seen and evaluated Rhonda Hammond and agree with the provider's findings (exceptions, if any, noted below).  I reviewed and directed the Emergency Department treatment given to Rhonda Hammond.    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  Still continues to smoke, admits freely to that.  Had CT recently that was unremarkable.  Will get screening labs today and give antiemetics but do not think need to repeat CT at this time.                  01/08/23 CT A/P:                  Image interpretations by me -  : Not Applicable  Rhythm interpretations from Monitor by me; : N/A  Consults made by me;: None      Critical Care Time:  None      S:  33 y.o. female c/o continued abdominal pain and vomiting.  States seen recently for same.    O:   BP 165/97   Pulse 58   Temp 97.5 F (36.4 C)   Resp 20   Ht 5\' 5"  (1.651 m)   Wt 93.4 kg (206 lb)   SpO2 98%   BMI 34.28 kg/m   Gen:  alert, nontoxic      Results for orders placed or performed during the hospital encounter of 01/31/23   BASIC METABOLIC PANEL   Result Value Ref Range    Potassium 4.2 3.5 - 5.5 mmol/L    Sodium 137 133 - 145 mmol/L    Chloride 103 98 - 110 mmol/L    Glucose 94 70 - 99 mg/dL    Calcium 9.0 8.4 - 16.1 mg/dL    BUN 7 6 - 22 mg/dL    Creatinine 0.5 0.5 - 1.2 mg/dL    CO2 23 20 - 32 mmol/L    eGFR >60.0 >60.0 mL/min/1.73 sq.m.    Anion Gap 11.0 3.0 - 15.0 mmol/L   HEPATIC FUNCTION PANEL   Result Value Ref Range    Albumin 4.2 3.5 - 5.0 g/dL    Total Protein 7.2 6.4 - 8.3 g/dL    Globulin 3.0 2.0 - 4.0 g/dL    A/G Ratio 1.4 1.1 - 2.6 ratio    Bilirubin Total 0.4 0.2 - 1.2 mg/dL    Bilirubin Direct <0.9 0.0 - 0.3 mg/dL    SGOT (AST) 23 10 - 37 U/L    Alkaline Phosphatase 50 25 - 115 U/L    SGPT (ALT) 14 5 - 40 U/L   LIPASE   Result Value Ref Range    Lipase <20 7 - 60 U/L   PREGNANCY SERUM   Result Value Ref Range    PREGNANCY SERUM Negative Negative    CBC WITH DIFFERENTIAL AUTO   Result Value Ref Range    WBC 4.4 4.0 - 11.0 K/uL    RBC 3.69 (L) 3.80 - 5.20 M/uL    HGB 9.6 (L) 11.7 - 15.5 g/dL    HCT 60.4 (L) 54.0 - 46.5 %    MCV 77 (L) 80 - 99 fL    MCH 26 26 - 34 pg    MCHC 34 31 - 36 g/dL    RDW 98.1 (H) 19.1 - 15.5 %    Platelet 214 140 - 440 K/uL    MPV 10.8 9.0 - 13.0 fL  Segmented Neutrophils (Auto) 54 40 - 75 %    Lymphocytes (Auto) 34 20 - 45 %    Monocytes (Auto) 11 3 - 12 %    Eosinophils (Auto) 2 0 - 6 %    Basophils (Auto) 0 0 - 2 %    Absolute Neutrophils (Auto) 2.3 1.8 - 7.7 K/uL    Absolute Lymphocytes (Auto) 1.5 1.0 - 4.8 K/uL    Absolute Monocytes (Auto) 0.5 0.1 - 1.0 K/uL    Absolute Eosinophils (Auto) 0.1 0.0 - 0.5 K/uL    Absolute Basophils (Auto) 0.0 0.0 - 0.2 K/uL   Urinalysis w Micro Reflex Culture    Specimen: Clean Catch Urine   Result Value Ref Range    Source Urine      Urine Color Yellow Colorless, Pale Yellow, Light Yellow, Yellow, Dark Yellow, Straw    Urine Clarity Clear Clear, Slightly Cloudy    Urine pH 7.0 5.0 - 8.0 pH    Urine Protein Screen 100* (A) Negative, Trace mg/dL    Urine Glucose Negative Negative mg/dL    Urine Ketones Trace (A) Negative mg/dL    Urine Occult Blood Negative Negative    Urine Specific Gravity 1.020 1.005 - 1.030    Urine Nitrite Negative Negative    Urine Leukocyte Esterase Negative Negative    Urine Bilirubin Negative Negative    Urine Urobilinogen 1.0 <2.0 mg/dL mg/dL    Urine RBC 0-2 Negative, 0-2 /hpf    Urine WBC 0-2 0 - 5 /hpf    Urine Bacteria Present (A) Negative    Squamous Epithelial Cells 3-5 (A) None, 0-2 /hpf    Hyaline Cast 0-2 0 - 2 /lpf

## 2023-02-07 ENCOUNTER — Inpatient Hospital Stay
Admit: 2023-02-07 | Discharge: 2023-02-07 | Disposition: A | Discharge status: Home / Self Care | Payer: PRIVATE HEALTH INSURANCE | Attending: Emergency Medicine

## 2023-02-07 DIAGNOSIS — J101 Influenza due to other identified influenza virus with other respiratory manifestations: Secondary | ICD-10-CM

## 2023-02-07 LAB — COMPREHENSIVE METABOLIC PANEL
ALT: 16 U/L (ref 10–49)
AST: 49 U/L — ABNORMAL HIGH (ref 0.0–33.9)
Albumin: 3.9 g/dL (ref 3.4–5.0)
Alkaline Phosphatase: 51 U/L (ref 46–116)
Anion Gap: 8 mmol/L (ref 5–15)
BUN: 6 mg/dL — ABNORMAL LOW (ref 9–23)
CO2: 26 meq/L (ref 20–31)
Calcium: 8.7 mg/dL (ref 8.7–10.4)
Chloride: 97 meq/L — ABNORMAL LOW (ref 98–107)
Creatinine: 0.72 mg/dL (ref 0.55–1.02)
GFR African American: >60
GFR Non-African American: >60
Glucose: 112 mg/dL — ABNORMAL HIGH (ref 74–106)
Potassium: 3.3 meq/L — ABNORMAL LOW (ref 3.5–5.1)
Sodium: 131 meq/L — ABNORMAL LOW (ref 136–145)
Total Bilirubin: 0.7 mg/dL (ref 0.30–1.20)
Total Protein: 7.4 g/dL (ref 5.7–8.2)

## 2023-02-07 LAB — COVID-19, FLU A/B, AND RSV COMBO
Influenza A PCR: POSITIVE — AB
Influenza virus B RNA: NEGATIVE
RSV A/B PCR: NEGATIVE
SARS-CoV-2: NEGATIVE

## 2023-02-07 LAB — LIPASE: Lipase: 23 U/L (ref 12–53)

## 2023-02-07 MED ORDER — DICYCLOMINE HCL 20 MG PO TABS
20 | ORAL_TABLET | Freq: Four times a day (QID) | ORAL | 0 refills | Status: AC | PRN
Start: 2023-02-07 — End: ?

## 2023-02-07 MED ORDER — SODIUM CHLORIDE 0.9 % IV BOLUS
0.9 | Freq: Once | INTRAVENOUS | Status: AC
Start: 2023-02-07 — End: 2023-02-07
  Administered 2023-02-07: 16:00:00 1000 mL via INTRAVENOUS

## 2023-02-07 MED ORDER — DICYCLOMINE HCL 10 MG/ML IM SOLN
10 | INTRAMUSCULAR | Status: AC
Start: 2023-02-07 — End: 2023-02-07
  Administered 2023-02-07: 17:00:00 20 mg via INTRAMUSCULAR

## 2023-02-07 MED ORDER — DIPHENHYDRAMINE HCL 50 MG/ML IJ SOLN
50 | INTRAMUSCULAR | Status: AC
Start: 2023-02-07 — End: 2023-02-07
  Administered 2023-02-07: 16:00:00 25 mg via INTRAVENOUS

## 2023-02-07 MED ORDER — ACETAMINOPHEN 10 MG/ML IV SOLN
10 | Freq: Once | INTRAVENOUS | Status: AC
Start: 2023-02-07 — End: 2023-02-07
  Administered 2023-02-07: 17:00:00 1000 mg via INTRAVENOUS

## 2023-02-07 MED ORDER — NAPROXEN 250 MG PO TABS
250 | ORAL | Status: AC
Start: 2023-02-07 — End: 2023-02-07
  Administered 2023-02-07: 18:00:00 500 mg via ORAL

## 2023-02-07 MED ORDER — PROCHLORPERAZINE EDISYLATE 10 MG/2ML IJ SOLN
10 | Freq: Once | INTRAMUSCULAR | Status: DC
Start: 2023-02-07 — End: 2023-02-07

## 2023-02-07 MED ORDER — ONDANSETRON HCL 4 MG/2ML IJ SOLN
4 | Freq: Four times a day (QID) | INTRAMUSCULAR | Status: DC | PRN
Start: 2023-02-07 — End: 2023-02-07
  Administered 2023-02-07: 16:00:00 4 mg via INTRAVENOUS

## 2023-02-07 MED ORDER — ONDANSETRON 4 MG PO TBDP
4 | ORAL_TABLET | Freq: Three times a day (TID) | ORAL | 0 refills | Status: AC | PRN
Start: 2023-02-07 — End: ?

## 2023-02-07 MED FILL — DIPHENHYDRAMINE HCL 50 MG/ML IJ SOLN: 50 MG/ML | INTRAMUSCULAR | Qty: 1

## 2023-02-07 MED FILL — ONDANSETRON HCL 4 MG/2ML IJ SOLN: 4 MG/2ML | INTRAMUSCULAR | Qty: 2

## 2023-02-07 MED FILL — DICYCLOMINE HCL 10 MG/ML IM SOLN: 10 MG/ML | INTRAMUSCULAR | Qty: 2

## 2023-02-07 MED FILL — NAPROXEN 250 MG PO TABS: 250 MG | ORAL | Qty: 2

## 2023-02-07 MED FILL — ACETAMINOPHEN 10 MG/ML IV SOLN: 10 MG/ML | INTRAVENOUS | Qty: 100

## 2023-02-07 MED FILL — PROCHLORPERAZINE EDISYLATE 10 MG/2ML IJ SOLN: 10 MG/2ML | INTRAMUSCULAR | Qty: 2

## 2023-02-07 NOTE — ED Notes (Signed)
Patient medicated per MAR. Patient has no other needs at this time.     Kathryne Eriksson, RN  02/07/23 1210

## 2023-02-07 NOTE — ED Notes (Signed)
Patient states she was seen yesterday at another facility where they gave her medication for her URI     Gibson Ramp, RN  02/07/23 1042

## 2023-02-07 NOTE — ED Provider Notes (Signed)
[facility] Care  Emergency Department Treatment Report        Patient: Rhonda Hammond Age: 33 y.o. Sex: female    Date of Birth: 09/23/1990 Admit Date: 02/07/2023 PCP: Danny Lawless, MD   MRN: [id number]  CSN: [id number]     Room: H07/H07 Time Dictated: 2:20 PM        Chief Complaint   Chief Complaint   Patient presents with    Abdominal Pain       History of Present Illness   This is a 33 y.o. female who presents stating that she think she has pancreatitis.  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     Intractable nausea and vomiting 06/26/2020    Obesity 12/16/2018    Ovarian cyst     Pancreatitis      Past Surgical History:   Procedure Laterality Date    CESAREAN SECTION      CHOLECYSTECTOMY      UPPER GASTROINTESTINAL ENDOSCOPY N/A 10/12/2021    EGD DIAGNOSTIC performed by Elberta Spaniel, MD at [facility] ENDOSCOPY         Social History     Social History     Socioeconomic History    Marital status: Single     Spouse name: Not on file    Number of children: Not on file    Years of education: Not on file    Highest education level: Not on file   Occupational History    Not on file   Tobacco Use    Smoking status: Every Day     Types: Cigars    Smokeless tobacco: Not on file   Vaping Use    Vaping status: Never Used   Substance and Sexual Activity    Alcohol use: Yes     Alcohol/week: 21.0 standard drinks of alcohol     Types: 21 Shots of liquor per week     Comment:  2-3 drinks a day, sometimes more    Drug use: Yes     Frequency: 7.0 times per week     Types: Marijuana Sheran Fava)    Sexual activity: Not on file   Other Topics Concern    Not on file   Social History Narrative    Not on file     Social Determinants of Health     Financial Resource Strain: Not on file   Food Insecurity: No Food Insecurity (12/16/2021)    Hunger Vital Sign     Worried About Running Out of Food in the Last Year: Never true     Ran Out of Food in the Last Year: Never true   Transportation Needs: No Transportation Needs (12/16/2021)    PRAPARE - Therapist, art (Medical): No  Lack of Transportation (Non-Medical): No   Physical Activity: Not on file   Stress: Not on file   Social Connections: Not on file   Intimate Partner Violence: Not on file   Housing Stability: Low Risk  (12/16/2021)    Housing Stability Vital Sign     Unable to Pay for Housing in the Last Year: No     Number of Places Lived in the Last Year: 1     Unstable Housing in the Last Year: No         Family History     Family History   Problem Relation Age of Onset    No Known Problems Mother          Current Medications     Current Facility-Administered Medications   Medication Dose Route Frequency Provider Last Rate Last Admin    ondansetron (ZOFRAN) injection 4 mg  4 mg IntraVENous Q6H PRN Christiana Pellant, MD   4 mg at 02/07/23 1108     Current Outpatient Medications   Medication Sig Dispense Refill    ondansetron (ZOFRAN-ODT) 4 MG disintegrating tablet Take 1 tablet by mouth every 8 hours as needed for Nausea or Vomiting 21 tablet 0    dicyclomine (BENTYL) 20 MG tablet Take 1 tablet by mouth 4 times daily as needed (abdominal pain) 20 tablet 0    pantoprazole (PROTONIX) 20 MG tablet Take 1 tablet by mouth daily 30 tablet 0    metoclopramide (REGLAN) 10 MG tablet Take 1 tablet by mouth 4 times daily 120 tablet 3    hyoscyamine (LEVSIN) 125 MCG tablet Take 1 tablet by mouth every 4 hours as needed for Cramping  40 tablet 0    famotidine (PEPCID) 20 MG tablet Take 1 tablet by mouth 2 times daily 60 tablet 0    sucralfate (CARAFATE) 1 GM tablet Take 1 tablet by mouth 4 times daily for 14 days 56 tablet 0    albuterol sulfate HFA (PROVENTIL;VENTOLIN;PROAIR) 108 (90 Base) MCG/ACT inhaler Inhale 2 puffs into the lungs every 6 hours as needed for Wheezing 18 g 3       Allergies     Allergies   Allergen Reactions    Compazine [Prochlorperazine] Other (See Comments)     twitching    Droperidol Other (See Comments)     Face twitching, drooling and spontaneous leg movement    Toradol [Ketorolac Tromethamine] Other (See Comments)     twitiching       Physical Exam   Patient Vitals for the past 24 hrs:   Temp Pulse Resp BP SpO2   02/07/23 1413 99.5 F (37.5 C) 95 18 133/82 95 %   02/07/23 1018 (!) 100.7 F (38.2 C) 92 18 (!) 148/89 98 %     Physical Exam  Constitutional:       Appearance: She is not ill-appearing.   HENT:      Head: Normocephalic.      Mouth/Throat:      Mouth: Mucous membranes are moist.   Eyes:      General: No scleral icterus.  Cardiovascular:      Rate and Rhythm: Normal rate and regular rhythm.   Pulmonary:      Effort: Pulmonary effort is normal.      Breath sounds: Normal breath sounds.   Abdominal:      Palpations: Abdomen is soft.      Tenderness: There is no abdominal tenderness.   Skin:     General:  Skin is warm and dry.   Neurological:      General: No focal deficit present.      Mental Status: She is alert.          Impression and Management Plan       Differential diagnoses cannabinoids hyperemesis syndrome, influenza, COVID, nonspecific virus, pancreatitis    Diagnostic Studies   Lab:   Results for orders placed or performed during the hospital encounter of 02/07/23   COVID-19, Flu A/B, and RSV Combo    Specimen: Nasopharyngeal Swab   Result Value Ref Range    SARS-CoV-2 NEGATIVE NEGATIVE      Influenza A PCR POSITIVE (A) NEGATIVE      Influenza virus B RNA NEGATIVE NEGATIVE      RSV A/B PCR NEGATIVE  NEGATIVE     CMP   Result Value Ref Range    Potassium 3.3 (L) 3.5 - 5.1 mEq/L    Chloride 97 (L) 98 - 107 mEq/L    Sodium 131 (L) 136 - 145 mEq/L    CO2 26 20 - 31 mEq/L    Glucose 112 (H) 74 - 106 mg/dl    BUN 6 (L) 9 - 23 mg/dl    Creatinine 1.61 0.96 - 1.02 mg/dl    GFR African American >60.0      GFR Non-African American >60      Calcium 8.7 8.7 - 10.4 mg/dl    Anion Gap 8 5 - 15 mmol/L    AST 49.0 (H) 0.0 - 33.9 U/L    ALT 16 10 - 49 U/L    Alkaline Phosphatase 51 46 - 116 U/L    Total Bilirubin 0.70 0.30 - 1.20 mg/dl    Total Protein 7.4 5.7 - 8.2 gm/dl    Albumin 3.9 3.4 - 5.0 gm/dl   Lipase   Result Value Ref Range    Lipase 23 12 - 53 U/L          EKG: My interpretation is that it shows       Prehospital EKG: My interpretation is that it shows       Cardiac Monitor Interpretation: My interpretation is that it shows       Imaging: Based on my personal interpretation, the    shows           ED Course         EXTERNAL RECORDS REVIEWED:  I reviewed the patient's previous records here at [facility] and available outside facilities and note that she was recently at an outside emergency department for similar symptoms.  Medical providers felt at that time that she did not require CT imaging    PREVIOUS RESULTS REVIEWED: Previous labs were reviewed.  She has had an elevated lipase in the past but it is normal today      Severe exacerbation or progression of chronic illness: Severe exacerbation  of cyclic vomiting      Medications   ondansetron (ZOFRAN) injection 4 mg (4 mg IntraVENous Given 02/07/23 1108)   diphenhydrAMINE (BENADRYL) injection 25 mg (25 mg IntraVENous Given 02/07/23 1109)   sodium chloride 0.9 % bolus 1,000 mL (0 mLs IntraVENous Stopped 02/07/23 1212)   dicyclomine (BENTYL) injection 20 mg (20 mg IntraMUSCular Given 02/07/23 1205)   acetaminophen (OFIRMEV) infusion 1,000 mg (0 mg IntraVENous Stopped 02/07/23 1246)   naproxen (NAPROSYN) tablet 500 mg (500 mg Oral Given 02/07/23 1244)         NARRATIVE:  She was  given  IV fluids  IM Bentyl  IV Tylenol    Oral naproxen    She looks well  Has not vomited since getting here    Has influenza which explains her low-grade fever and bodyaches and cough    I believe she can be safely discharged home    Medical Decision Making   I have answered all questions.  I have discussed my recommendations for follow-up and reasons to return.  I have encouraged early return should worrisome signs or symptoms develop or if there is any worsening or unanticipated persistence of their presenting condition.  I have discussed any limitations to their daily activities and the treatment plan for at home.       I considered prescribing narcotics and determined the risks outweighed the benefits in this case    Final Diagnosis       ICD-10-CM    1. 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 Carmela Hurt, MD F-ACEP  February 07, 2023    My signature above authenticates this document and my orders, the final    diagnosis (es), discharge prescription (s), and instructions in the Epic    record.  If you have any questions please contact [phone].     Nursing notes have been reviewed by the physician/ advanced practice    Clinician.                             Christiana Pellant, MD  02/07/23 1421

## 2023-02-07 NOTE — ED Notes (Signed)
Patient is rocking back and forth in the hall chair     Gibson Ramp, [place]  02/07/23 1121

## 2023-02-07 NOTE — Discharge Instructions (Signed)
Use Tylenol and ibuprofen as needed for body aches, cough, fever    Use the Zofran as needed for nausea    Follow-up with your doctors as soon as possible    Do not smoke marijuana or drink alcohol

## 2023-02-07 NOTE — ED Notes (Signed)
No vomiting noted while the patient was in the ED     Gibson Ramp, RN  02/07/23 1428

## 2023-02-07 NOTE — ED Triage Notes (Signed)
Patient via EMS from home for eval of abdominal pain, Hx pancreatitis. Also some cough/URI symptoms. Onset three days ago, seen for similar at that time.

## 2023-02-07 NOTE — ED Notes (Signed)
Patient DC by the provider, patient will wait in the waiting room for a med cab     Gibson Ramp, [place]  02/07/23 1428

## 2023-10-23 ENCOUNTER — Encounter: Attending: Family Medicine | Primary: Family Medicine
